# Patient Record
Sex: Male | Born: 1955 | Race: White | Hispanic: No | Marital: Married | Smoking: Never smoker
Health system: Southern US, Community
[De-identification: ages and names within clinical notes are randomized; demographics above are authoritative.]

## PROBLEM LIST (undated history)

## (undated) ENCOUNTER — Encounter

## (undated) ENCOUNTER — Encounter: Attending: Hematology & Oncology | Primary: Hematology & Oncology

## (undated) ENCOUNTER — Ambulatory Visit: Payer: MEDICARE

## (undated) ENCOUNTER — Telehealth

## (undated) ENCOUNTER — Ambulatory Visit

## (undated) ENCOUNTER — Encounter
Attending: Student in an Organized Health Care Education/Training Program | Primary: Student in an Organized Health Care Education/Training Program

## (undated) ENCOUNTER — Encounter: Attending: Oncology | Primary: Oncology

## (undated) ENCOUNTER — Ambulatory Visit
Payer: MEDICARE | Attending: Adult Reconstructive Orthopaedic Surgery | Primary: Adult Reconstructive Orthopaedic Surgery

## (undated) ENCOUNTER — Encounter: Payer: MEDICARE | Attending: Internal Medicine | Primary: Internal Medicine

## (undated) ENCOUNTER — Inpatient Hospital Stay: Payer: Medicare (Managed Care)

## (undated) ENCOUNTER — Encounter: Attending: "Endocrinology | Primary: "Endocrinology

## (undated) ENCOUNTER — Ambulatory Visit
Payer: MEDICARE | Attending: Student in an Organized Health Care Education/Training Program | Primary: Student in an Organized Health Care Education/Training Program

## (undated) ENCOUNTER — Telehealth: Attending: Family | Primary: Family

## (undated) ENCOUNTER — Other Ambulatory Visit

## (undated) ENCOUNTER — Encounter: Attending: Gastroenterology | Primary: Gastroenterology

## (undated) ENCOUNTER — Non-Acute Institutional Stay: Payer: MEDICARE

## (undated) ENCOUNTER — Telehealth
Attending: Student in an Organized Health Care Education/Training Program | Primary: Student in an Organized Health Care Education/Training Program

## (undated) ENCOUNTER — Telehealth: Attending: Internal Medicine | Primary: Internal Medicine

## (undated) ENCOUNTER — Encounter: Attending: Family Medicine | Primary: Family Medicine

## (undated) ENCOUNTER — Telehealth: Attending: Hematology & Oncology | Primary: Hematology & Oncology

## (undated) ENCOUNTER — Ambulatory Visit: Payer: MEDICARE | Attending: Otolaryngology | Primary: Otolaryngology

## (undated) ENCOUNTER — Encounter: Attending: Pharmacist | Primary: Pharmacist

## (undated) ENCOUNTER — Telehealth: Attending: Oncology | Primary: Oncology

## (undated) ENCOUNTER — Encounter: Attending: Hematology | Primary: Hematology

## (undated) ENCOUNTER — Ambulatory Visit: Payer: Medicare (Managed Care)

## (undated) ENCOUNTER — Encounter
Payer: MEDICARE | Attending: Student in an Organized Health Care Education/Training Program | Primary: Student in an Organized Health Care Education/Training Program

## (undated) ENCOUNTER — Encounter: Payer: MEDICARE | Attending: Family Medicine | Primary: Family Medicine

## (undated) ENCOUNTER — Telehealth: Attending: Family Medicine | Primary: Family Medicine

## (undated) ENCOUNTER — Encounter: Payer: MEDICARE | Attending: Oncology | Primary: Oncology

## (undated) ENCOUNTER — Ambulatory Visit: Payer: Medicare (Managed Care) | Attending: "Endocrinology | Primary: "Endocrinology

## (undated) ENCOUNTER — Ambulatory Visit: Attending: Otolaryngology | Primary: Otolaryngology

## (undated) DIAGNOSIS — C801 Malignant (primary) neoplasm, unspecified: Secondary | ICD-10-CM

## (undated) DIAGNOSIS — E119 Type 2 diabetes mellitus without complications: Secondary | ICD-10-CM

## (undated) HISTORY — PX: COLON SURGERY: SHX602

## (undated) HISTORY — PX: OTHER SURGICAL HISTORY: SHX169

---
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
## 2010-06-23 ENCOUNTER — Ambulatory Visit: Payer: Self-pay

## 2010-09-06 ENCOUNTER — Emergency Department: Payer: Self-pay | Admitting: Emergency Medicine

## 2010-11-05 ENCOUNTER — Ambulatory Visit: Payer: Self-pay

## 2011-02-17 ENCOUNTER — Ambulatory Visit: Payer: Self-pay | Admitting: Otolaryngology

## 2011-03-07 ENCOUNTER — Ambulatory Visit: Payer: Self-pay | Admitting: Unknown Physician Specialty

## 2011-03-07 DIAGNOSIS — G4730 Sleep apnea, unspecified: Secondary | ICD-10-CM

## 2011-03-13 ENCOUNTER — Ambulatory Visit: Payer: Self-pay | Admitting: Unknown Physician Specialty

## 2011-09-28 DIAGNOSIS — Z980 Intestinal bypass and anastomosis status: Secondary | ICD-10-CM

## 2011-12-31 DIAGNOSIS — H25049 Posterior subcapsular polar age-related cataract, unspecified eye: Secondary | ICD-10-CM | POA: Insufficient documentation

## 2012-01-29 DIAGNOSIS — G4733 Obstructive sleep apnea (adult) (pediatric): Secondary | ICD-10-CM | POA: Diagnosis present

## 2012-11-07 LAB — COMPREHENSIVE METABOLIC PANEL
Albumin: 5 g/dL (ref 3.4–5.0)
BUN: 13 mg/dL (ref 7–18)
Creatinine: 1.6 mg/dL — ABNORMAL HIGH (ref 0.60–1.30)
EGFR (African American): 55 — ABNORMAL LOW
EGFR (Non-African Amer.): 47 — ABNORMAL LOW
Glucose: 148 mg/dL — ABNORMAL HIGH (ref 65–99)
SGPT (ALT): 51 U/L (ref 12–78)
Total Protein: 9.1 g/dL — ABNORMAL HIGH (ref 6.4–8.2)

## 2012-11-07 LAB — CBC WITH DIFFERENTIAL/PLATELET
Basophil #: 0 10*3/uL (ref 0.0–0.1)
HCT: 54.6 % — ABNORMAL HIGH (ref 40.0–52.0)
Lymphocyte #: 0.9 10*3/uL — ABNORMAL LOW (ref 1.0–3.6)
Lymphocyte %: 4.9 %
MCH: 29.7 pg (ref 26.0–34.0)
MCHC: 34.4 g/dL (ref 32.0–36.0)
MCV: 87 fL (ref 80–100)
Platelet: 237 10*3/uL (ref 150–440)
RBC: 6.31 10*6/uL — ABNORMAL HIGH (ref 4.40–5.90)
RDW: 13 % (ref 11.5–14.5)
WBC: 18.4 10*3/uL — ABNORMAL HIGH (ref 3.8–10.6)

## 2012-11-07 LAB — URINALYSIS, COMPLETE
Bacteria: NONE SEEN
Bilirubin,UR: NEGATIVE
Blood: NEGATIVE
Glucose,UR: NEGATIVE mg/dL (ref 0–75)
Leukocyte Esterase: NEGATIVE
Nitrite: NEGATIVE
Ph: 6 (ref 4.5–8.0)
Protein: NEGATIVE
Specific Gravity: 1.008 (ref 1.003–1.030)

## 2012-11-07 LAB — TSH: Thyroid Stimulating Horm: 4.36 u[IU]/mL

## 2012-11-08 ENCOUNTER — Inpatient Hospital Stay: Payer: Self-pay | Admitting: Internal Medicine

## 2012-11-08 LAB — BASIC METABOLIC PANEL
Anion Gap: 8 (ref 7–16)
Calcium, Total: 8.5 mg/dL (ref 8.5–10.1)
Chloride: 103 mmol/L (ref 98–107)
Co2: 23 mmol/L (ref 21–32)
EGFR (African American): >60
Osmolality: 271 (ref 275–301)
Potassium: 3.6 mmol/L (ref 3.5–5.1)

## 2012-11-08 LAB — LIPASE, BLOOD: Lipase: 146 U/L (ref 73–393)

## 2012-11-09 LAB — BASIC METABOLIC PANEL
BUN: 15 mg/dL (ref 7–18)
Calcium, Total: 8.2 mg/dL — ABNORMAL LOW (ref 8.5–10.1)
EGFR (African American): >60
Glucose: 159 mg/dL — ABNORMAL HIGH (ref 65–99)
Potassium: 3.3 mmol/L — ABNORMAL LOW (ref 3.5–5.1)
Sodium: 135 mmol/L — ABNORMAL LOW (ref 136–145)

## 2012-11-09 LAB — CBC WITH DIFFERENTIAL/PLATELET
Basophil %: 0.7 %
Eosinophil #: 0.3 10*3/uL (ref 0.0–0.7)
Eosinophil %: 6 %
HGB: 14.1 g/dL (ref 13.0–18.0)
Lymphocyte #: 1 10*3/uL (ref 1.0–3.6)
Lymphocyte %: 17.4 %
MCV: 87 fL (ref 80–100)
Monocyte #: 0.4 x10 3/mm (ref 0.2–1.0)
Monocyte %: 7.4 %
Platelet: 185 10*3/uL (ref 150–440)
RBC: 4.82 10*6/uL (ref 4.40–5.90)
RDW: 12.8 % (ref 11.5–14.5)

## 2012-11-09 LAB — HEPATIC FUNCTION PANEL A (ARMC)
Albumin: 3.3 g/dL — ABNORMAL LOW (ref 3.4–5.0)
Alkaline Phosphatase: 142 U/L — ABNORMAL HIGH (ref 50–136)
Bilirubin, Direct: 0.2 mg/dL (ref 0.00–0.20)
Bilirubin,Total: 1 mg/dL (ref 0.2–1.0)
SGPT (ALT): 39 U/L (ref 12–78)
Total Protein: 6.3 g/dL — ABNORMAL LOW (ref 6.4–8.2)

## 2012-11-09 LAB — MAGNESIUM: Magnesium: 1.5 mg/dL — ABNORMAL LOW

## 2012-11-11 LAB — STOOL CULTURE

## 2013-05-28 ENCOUNTER — Emergency Department: Payer: Self-pay | Admitting: Emergency Medicine

## 2013-05-28 LAB — COMPREHENSIVE METABOLIC PANEL
Albumin: 4.1 g/dL (ref 3.4–5.0)
Anion Gap: 5 — ABNORMAL LOW (ref 7–16)
BUN: 13 mg/dL (ref 7–18)
Calcium, Total: 9.1 mg/dL (ref 8.5–10.1)
Creatinine: 1.33 mg/dL — ABNORMAL HIGH (ref 0.60–1.30)
EGFR (African American): >60
EGFR (Non-African Amer.): 59 — ABNORMAL LOW
Glucose: 170 mg/dL — ABNORMAL HIGH (ref 65–99)
SGOT(AST): 48 U/L — ABNORMAL HIGH (ref 15–37)
SGPT (ALT): 56 U/L (ref 12–78)
Total Protein: 8.2 g/dL (ref 6.4–8.2)

## 2013-05-28 LAB — URINALYSIS, COMPLETE
Bilirubin,UR: NEGATIVE
Glucose,UR: NEGATIVE mg/dL (ref 0–75)
Nitrite: NEGATIVE
Ph: 6 (ref 4.5–8.0)
RBC,UR: 1 /HPF (ref 0–5)
Squamous Epithelial: NONE SEEN
WBC UR: <1 /HPF (ref 0–5)

## 2013-05-28 LAB — CBC
HGB: 16.7 g/dL (ref 13.0–18.0)
MCHC: 35.3 g/dL (ref 32.0–36.0)
Platelet: 258 10*3/uL (ref 150–440)
RDW: 13.3 % (ref 11.5–14.5)
WBC: 11.8 10*3/uL — ABNORMAL HIGH (ref 3.8–10.6)

## 2013-05-28 LAB — CK TOTAL AND CKMB (NOT AT ARMC)
CK, Total: 69 U/L (ref 35–232)
CK-MB: 1.7 ng/mL (ref 0.5–3.6)

## 2013-08-18 DIAGNOSIS — K5190 Ulcerative colitis, unspecified, without complications: Secondary | ICD-10-CM | POA: Insufficient documentation

## 2013-10-06 DIAGNOSIS — J383 Other diseases of vocal cords: Secondary | ICD-10-CM | POA: Insufficient documentation

## 2013-10-13 DIAGNOSIS — G4761 Periodic limb movement disorder: Secondary | ICD-10-CM | POA: Insufficient documentation

## 2013-11-12 ENCOUNTER — Emergency Department: Payer: Self-pay | Admitting: Emergency Medicine

## 2013-11-12 LAB — CBC WITH DIFFERENTIAL/PLATELET
BASOS ABS: 0.1 10*3/uL (ref 0.0–0.1)
Basophil %: 0.4 %
Eosinophil #: 0 10*3/uL (ref 0.0–0.7)
Eosinophil %: 0.2 %
HCT: 53.6 % — AB (ref 40.0–52.0)
HGB: 18.4 g/dL — ABNORMAL HIGH (ref 13.0–18.0)
LYMPHS ABS: 0.6 10*3/uL — AB (ref 1.0–3.6)
Lymphocyte %: 4.5 %
MCH: 30 pg (ref 26.0–34.0)
MCHC: 34.3 g/dL (ref 32.0–36.0)
MCV: 87 fL (ref 80–100)
MONO ABS: 0.5 x10 3/mm (ref 0.2–1.0)
MONOS PCT: 3.9 %
NEUTROS ABS: 12.8 10*3/uL — AB (ref 1.4–6.5)
Neutrophil %: 91 %
Platelet: 240 10*3/uL (ref 150–440)
RBC: 6.14 10*6/uL — ABNORMAL HIGH (ref 4.40–5.90)
RDW: 13.2 % (ref 11.5–14.5)
WBC: 14.1 10*3/uL — ABNORMAL HIGH (ref 3.8–10.6)

## 2013-11-12 LAB — COMPREHENSIVE METABOLIC PANEL
ALBUMIN: 4.4 g/dL (ref 3.4–5.0)
ALK PHOS: 319 U/L — AB
ANION GAP: 9 (ref 7–16)
AST: 57 U/L — AB (ref 15–37)
BUN: 13 mg/dL (ref 7–18)
Bilirubin,Total: 1.6 mg/dL — ABNORMAL HIGH (ref 0.2–1.0)
CALCIUM: 10.5 mg/dL — AB (ref 8.5–10.1)
CO2: 25 mmol/L (ref 21–32)
CREATININE: 1.6 mg/dL — AB (ref 0.60–1.30)
Chloride: 98 mmol/L (ref 98–107)
EGFR (Non-African Amer.): 47 — ABNORMAL LOW
GFR CALC AF AMER: 54 — AB
Glucose: 265 mg/dL — ABNORMAL HIGH (ref 65–99)
OSMOLALITY: 274 (ref 275–301)
Potassium: 3.9 mmol/L (ref 3.5–5.1)
SGPT (ALT): 81 U/L — ABNORMAL HIGH (ref 12–78)
Sodium: 132 mmol/L — ABNORMAL LOW (ref 136–145)
TOTAL PROTEIN: 9.5 g/dL — AB (ref 6.4–8.2)

## 2013-11-12 LAB — LIPASE, BLOOD: LIPASE: 361 U/L (ref 73–393)

## 2013-11-13 LAB — URINALYSIS, COMPLETE
BACTERIA: NONE SEEN
Bilirubin,UR: NEGATIVE
Blood: NEGATIVE
Glucose,UR: NEGATIVE mg/dL (ref 0–75)
KETONE: NEGATIVE
Leukocyte Esterase: NEGATIVE
Nitrite: NEGATIVE
Ph: 5 (ref 4.5–8.0)
RBC,UR: 2 /HPF (ref 0–5)
SPECIFIC GRAVITY: 1.021 (ref 1.003–1.030)
Squamous Epithelial: NONE SEEN
WBC UR: 1 /HPF (ref 0–5)

## 2014-09-01 DIAGNOSIS — K91850 Pouchitis: Secondary | ICD-10-CM | POA: Insufficient documentation

## 2014-10-22 ENCOUNTER — Inpatient Hospital Stay: Payer: Self-pay | Admitting: Internal Medicine

## 2014-10-22 LAB — CBC WITH DIFFERENTIAL/PLATELET
Basophil #: 0.1 10*3/uL (ref 0.0–0.1)
Basophil %: 0.3 %
Eosinophil #: 0.1 10*3/uL (ref 0.0–0.7)
Eosinophil %: 0.3 %
HCT: 51.6 % (ref 40.0–52.0)
HGB: 17.5 g/dL (ref 13.0–18.0)
LYMPHS ABS: 0.7 10*3/uL — AB (ref 1.0–3.6)
Lymphocyte %: 3.5 %
MCH: 29 pg (ref 26.0–34.0)
MCHC: 33.9 g/dL (ref 32.0–36.0)
MCV: 86 fL (ref 80–100)
MONO ABS: 0.4 x10 3/mm (ref 0.2–1.0)
MONOS PCT: 1.8 %
NEUTROS PCT: 94.1 %
Neutrophil #: 18.7 10*3/uL — ABNORMAL HIGH (ref 1.4–6.5)
PLATELETS: 300 10*3/uL (ref 150–440)
RBC: 6.04 10*6/uL — AB (ref 4.40–5.90)
RDW: 12.8 % (ref 11.5–14.5)
WBC: 19.9 10*3/uL — AB (ref 3.8–10.6)

## 2014-10-22 LAB — COMPREHENSIVE METABOLIC PANEL
ALBUMIN: 4.3 g/dL (ref 3.4–5.0)
ALK PHOS: 251 U/L — AB (ref 46–116)
ALT: 56 U/L (ref 14–63)
AST: 45 U/L — AB (ref 15–37)
Anion Gap: 10 (ref 7–16)
BILIRUBIN TOTAL: 2.7 mg/dL — AB (ref 0.2–1.0)
BUN: 17 mg/dL (ref 7–18)
CHLORIDE: 92 mmol/L — AB (ref 98–107)
Calcium, Total: 9.9 mg/dL (ref 8.5–10.1)
Co2: 23 mmol/L (ref 21–32)
Creatinine: 1.63 mg/dL — ABNORMAL HIGH (ref 0.60–1.30)
EGFR (Non-African Amer.): 46 — ABNORMAL LOW
GFR CALC AF AMER: 56 — AB
GLUCOSE: 295 mg/dL — AB (ref 65–99)
Osmolality: 264 (ref 275–301)
Potassium: 4.9 mmol/L (ref 3.5–5.1)
Sodium: 125 mmol/L — ABNORMAL LOW (ref 136–145)
Total Protein: 9.2 g/dL — ABNORMAL HIGH (ref 6.4–8.2)

## 2014-10-22 LAB — URINALYSIS, COMPLETE
BILIRUBIN, UR: NEGATIVE
Bacteria: NONE SEEN
Blood: NEGATIVE
Glucose,UR: NEGATIVE mg/dL (ref 0–75)
Hyaline Cast: 41
KETONE: NEGATIVE
Leukocyte Esterase: NEGATIVE
Nitrite: NEGATIVE
PH: 6 (ref 4.5–8.0)
Protein: 30
RBC,UR: 1 /HPF (ref 0–5)
SPECIFIC GRAVITY: 1.015 (ref 1.003–1.030)
WBC UR: 1 /HPF (ref 0–5)

## 2014-10-22 LAB — OCCULT BLOOD X 1 CARD TO LAB, STOOL: Occult Blood, Feces: POSITIVE

## 2014-10-22 LAB — CLOSTRIDIUM DIFFICILE(ARMC)

## 2014-10-23 LAB — CBC WITH DIFFERENTIAL/PLATELET
Basophil #: 0 10*3/uL (ref 0.0–0.1)
Basophil %: 0.4 %
EOS ABS: 0.3 10*3/uL (ref 0.0–0.7)
EOS PCT: 4.4 %
HCT: 40.5 % (ref 40.0–52.0)
HGB: 14 g/dL (ref 13.0–18.0)
LYMPHS PCT: 18.2 %
Lymphocyte #: 1.3 10*3/uL (ref 1.0–3.6)
MCH: 29.7 pg (ref 26.0–34.0)
MCHC: 34.5 g/dL (ref 32.0–36.0)
MCV: 86 fL (ref 80–100)
MONO ABS: 0.3 x10 3/mm (ref 0.2–1.0)
Monocyte %: 4.9 %
NEUTROS PCT: 72.1 %
Neutrophil #: 5 10*3/uL (ref 1.4–6.5)
Platelet: 186 10*3/uL (ref 150–440)
RBC: 4.7 10*6/uL (ref 4.40–5.90)
RDW: 12.9 % (ref 11.5–14.5)
WBC: 6.9 10*3/uL (ref 3.8–10.6)

## 2014-10-23 LAB — HEPATIC FUNCTION PANEL A (ARMC)
ALK PHOS: 174 U/L — AB (ref 46–116)
Albumin: 3 g/dL — ABNORMAL LOW (ref 3.4–5.0)
BILIRUBIN DIRECT: 0.3 mg/dL — AB (ref 0.0–0.2)
Bilirubin,Total: 1.5 mg/dL — ABNORMAL HIGH (ref 0.2–1.0)
SGOT(AST): 29 U/L (ref 15–37)
SGPT (ALT): 36 U/L (ref 14–63)
Total Protein: 6.4 g/dL (ref 6.4–8.2)

## 2014-10-23 LAB — BASIC METABOLIC PANEL
Anion Gap: 5 — ABNORMAL LOW (ref 7–16)
BUN: 16 mg/dL (ref 7–18)
CALCIUM: 8.3 mg/dL — AB (ref 8.5–10.1)
CREATININE: 1.41 mg/dL — AB (ref 0.60–1.30)
Chloride: 107 mmol/L (ref 98–107)
Co2: 26 mmol/L (ref 21–32)
EGFR (African American): >60
GFR CALC NON AF AMER: 55 — AB
Glucose: 175 mg/dL — ABNORMAL HIGH (ref 65–99)
Osmolality: 281 (ref 275–301)
POTASSIUM: 4 mmol/L (ref 3.5–5.1)
Sodium: 138 mmol/L (ref 136–145)

## 2014-10-23 LAB — HEMOGLOBIN A1C: Hemoglobin A1C: 8.7 % — ABNORMAL HIGH (ref 4.2–6.3)

## 2014-10-24 LAB — WBCS, STOOL

## 2014-10-24 LAB — STOOL CULTURE

## 2015-01-14 NOTE — H&P (Signed)
PATIENT NAME:  [name], [name] MR#:  [id number] DATE OF BIRTH:  1955-10-13  DATE OF ADMISSION:  11/08/2012  PRIMARY CARE PHYSICIAN: [facility].  REFERRING PHYSICIAN: Conni Slipper, MD  CHIEF COMPLAINT: Abdominal pain, nausea, vomiting, syncope.   HISTORY OF PRESENT ILLNESS: This is a 59 year old male with significant past medical history of colon cancer, status post colectomy due to colon cancer, history of ulcerative colitis in 1990, history of chronically elevated LFTs, depression and short gut syndrome, who presents with complaints of syncope. The patient reports syncope happened when he was going to the bathroom. He felt lightheaded and dizzy, where he fell on the floor and had loss of consciousness, where he was found by his wife, who heard him falling on the floor. The patient remained unconscious for 3 minutes. No seizure-like activity, no tongue biting, no frothy discharge from the mouth. The patient reports he has been having significant diarrhea for the last 2 days with vomiting and nausea as well. Reports usually has baseline diarrhea due to his colectomy with short gut syndrome, but it is usually more frequent episodes but not watery, but reports it has been watery for the last 2 days, multiple episodes, as well had significant nausea and vomiting today, 5 times so far. As well, reports decreased p.o. intake. The patient reports he thinks he ate some bad fruit 2 days before his symptoms started. Denies anyone else in the house has similar symptoms and denies eating outside. The patient was found in acute renal failure with creatinine of 1.6. As well, he was found to have low sodium of 132, and he has significant leukocytosis of 18,000 and significant polycythemia of 18 as well. The patient reports he is having history of chronic elevated LFTs, but unclear what is his baseline. Reports questionable family history of primary sclerosing cholangitis in his sister. Reports he has been  followed by [facility], last time he saw them was before 1 year. The patient had CT head, which did not show any evidence of acute finding, but did show evidence of sinusitis. The patient has been reporting flulike symptoms, runny nose and discharge over the last 3 days as well. CT abdomen and pelvis did show mild dilatation of mid to distal small bowel loop with some air-fluid levels at multiple levels, which is possible dynamic ileus or potentially enteritis. As well, this might be also seen status post colectomy state. Hospitalist service was requested to admit the patient for further management and workup of his symptoms.   ALLERGIES: SULFA DRUGS.   HOME MEDICATIONS:  1. Ibuprofen as needed.  2. Fluoxetine 20 mg daily.  3. Omeprazole 20 mg daily.  4. Zantac 0.5 mL every 12 hours. 5. Percocet 1 tablet as needed for pain.  6. Ultram 50 mg every 6 hours as needed for pain.   PAST MEDICAL HISTORY: 1. Allergies. 2. Sleep apnea.  3. Cataract.  4. Colon cancer in the 1990s. 5. Ulcerative colitis in the 1990s.  6. Anxiety.  7. Detached left retina.  8. History of liver disease.  9. Depression.   PAST SURGICAL HISTORY:  1. Left retinal repair at 59 years old.  2. Right knee arthroscopy.  3. Removal of 90% of colon in the late 1990s. 4. J-pouch reversal.  5. Neck surgery with disk replacement in 2008.  6. Voice box surgery.  7. Arthroscopic rotator cuff repair on the left shoulder.  8. Colectomy due to colon cancer.   SOCIAL HISTORY: Married. No history of alcohol  or tobacco use or illicit drug use.   FAMILY HISTORY: Significant for rheumatoid arthritis in his siblings, colon cancer in father and sister and reports family history of liver disease in his sister, for which she received transplant, which failed. Reports questionable history of primary sclerosing cholangitis in his sister.   REVIEW OF SYSTEMS:  CONSTITUTIONAL: Denies any fever or chills. Complains of fatigue, weakness. Denies  weight gain, weight loss.  EYES: Denies blurry vision, double vision, inflammation.  ENT: Denies tinnitus, ear pain, hearing loss, epistaxis. Has discharge and some postnasal drip.  RESPIRATORY: Denies cough, wheezing, hemoptysis, COPD, pneumonia.  CARDIOVASCULAR: Denies chest pain, edema, orthopnea, palpitations. Had syncope.  GASTROINTESTINAL: Complains of nausea, vomiting, diarrhea, with mild abdominal pain. Denies hematemesis, rectal bleed, coffee-ground emesis or melena.  GENITOURINARY: Denies dysuria, hematuria, renal colic.  ENDOCRINE: Denies polyuria, polydipsia, heat or cold intolerance.  HEMATOLOGY: Denies anemia, easy bruising, bleeding diathesis.  INTEGUMENTARY: Denies acne, rash or skin lesions.  MUSCULOSKELETAL: Denies any swelling, gout, limited activity or cramps.  NEUROLOGICAL: Denies numbness, dysarthria, epilepsy, tremors, vertigo, CVA or seizures in the past.  PSYCHIATRIC: Denies substance abuse. Denies alcohol abuse. Has history of depression. Denies insomnia.   PHYSICAL EXAMINATION:  VITAL SIGNS: Temperature 98.4, pulse 91, respiratory rate 18, blood pressure 110/70, saturating 99% on room air.  GENERAL: Well-nourished male, looks comfortable, in no apparent distress.  HEENT: Head atraumatic, normocephalic. Pupils equally reactive to light. Pink conjunctivae. Anicteric sclerae. Dry oral mucosa.  NECK: Supple. No thyromegaly. No JVD.  CHEST: Good air entry bilaterally. No wheezing, rales or rhonchi.  CARDIOVASCULAR: S1, S2 heard. No rubs, murmurs or gallops.  ABDOMEN: Very minimal tenderness in the right lower quadrant, but no rebound, no guarding. Bowel sounds hyperperistaltic. No organomegaly could be felt.  EXTREMITIES: No edema. No clubbing. No cyanosis.  PSYCHIATRIC: Appropriate, awake, alert x3. Intact judgment and insight.  NEUROLOGIC: Cranial nerves grossly intact. Motor 5 out of 5.  SKIN: Has delayed skin turgor, appears dehydrated. Warm and dry.   PERTINENT  LABORATORY: Glucose 148, BUN 13, creatinine 1.06, sodium 132, potassium 4.2, chloride 97, CO2 27, total protein 9.1, total bilirubin 2.3, alkaline phosphatase 204, AST 46, ALT 51. Troponin less than 0.02. TSH 4.36. Urinalysis negative. White blood cells 18.4, hemoglobin 18.8, hematocrit 54.6, platelets 237. C. difficile negative.   IMAGING: 1. CT abdomen and pelvis without contrast showing distended fluid- and air-filled loops in the small bowel appreciated throughout the abdomen and pelvis, finding can be seen due to ileus, possibly enteritis. As well, these findings can be found in small bowel in postcolectomy state.  2. CT head without contrast: No evidence of acute intracranial abnormality and sinus disease on the left.  3. Chest x-ray showing atelectasis versus infiltrate in the right middle lobe   ASSESSMENT AND PLAN: This is a 59 year old male who presents with syncope, most likely due to volume depletion due to significant diarrhea and vomiting from gastroenteritis.   1. Syncope. The patient appeared to be dehydrated with volume depletion with acute renal failure secondary to his vomiting and diarrhea. Will check orthostatics, but the patient is tachycardic with soft blood pressure, so will continue with aggressive hydration. Will check orthostatics and will have him on telemonitor.  2. Vomiting and diarrhea. This is most likely due to gastroenteritis. CT abdomen and pelvis showing multiple possible findings, but it is unlikely to be ileus as he is having significant diarrhea. As well, he has history of eating some bad fruit. He is C. difficile  negative as well. Will continue him on Cipro and Flagyl and will follow on the stool workup. Will continue him on Cipro and Flagyl until the rest of workup is back. As well, his symptoms have worsened by baseline diarrhea due to short gut syndrome from his colectomy.  3. Hyponatremia due to volume depletion. Continue with fluids.  4. Leukocytosis and  elevated hemoglobin. This is from volume depletion and dehydration. Will recheck after appropriate hydration.  5. Elevated liver function tests. The patient reports he is known to have history of elevated LFTs. Unclear what is his baseline. He was seen 1 time last year at [place], and he was told that they will continue to monitor. For now, will check his right upper quadrant ultrasound.  6. Depression. Continue home medication.  7. Deep vein thrombosis prophylaxis. Subcutaneous heparin.  8. Gastrointestinal prophylaxis. On Protonix.   CODE STATUS: Full code.   TOTAL TIME SPENT ON ADMISSION AND PATIENT CARE: 60 minutes   ____________________________ Albertine Patricia, MD dse:OSi D: 11/08/2012 02:36:38 ET T: 11/08/2012 14:02:45 ET JOB#: [id number]  cc: Albertine Patricia, MD, <Dictator> DAWOOD Graciela Husbands MD ELECTRONICALLY SIGNED 11/10/2012 3:54

## 2015-01-14 NOTE — Discharge Summary (Signed)
PATIENT NAME:  [name], [name] MR#:  [id number] DATE OF BIRTH:  17-Aug-1956  DATE OF ADMISSION:  11/08/2012 DATE OF DISCHARGE:  11/09/2012  PRIMARY CARE PHYSICIAN: Dr. Sara Chu   DISCHARGE DIAGNOSES: 1. Acute gastroenteritis.  2. Syncope.  3. Acute renal failure.  4. Hyponatremia.  5. Hypokalemia.  6. Hypomagnesemia.   CONDITION: Stable.   CODE STATUS:  FULL CODE.     HOME MEDICATIONS:  1. Fluoxetine 20 mg p.o. daily.  2. Omeprazole 20 mg p.o. daily.  3. Zantac p.o. every 12 hours.  4. Fluticasone nasal 50 mcg INH nasal spray 1 spray once a day.  5. Cipro 500 mg p.o. q. 12 hours for 5 days.  6.   Klor-con 10 1 tab p.o. once a day for 5 days.  7. Ambien 5 mg p.o. at bedtime for 10 days.  8. Imodium 1 tablet every 4 hours p.r.n. for diarrhea for 7 days.   DIET: Regular diet.   ACTIVITY: As tolerated.   FOLLOW-UP CARE:  1. Follow up with PCP within 1 to 2 weeks.  2. Follow up with [facility].   REASON FOR ADMISSION: Abdominal pain, nausea, vomiting and syncope.   HOSPITAL COURSE: The patient is a 59 year old Caucasian male with a history of colon cancer, status post colectomy due to colon cancer, history of ulcerative colitis, chronically elevated liver function tests, short gut syndrome, presented to the ED with syncope. The patient felt lightheaded and dizzy in the bathroom where he fell on the floor and had a loss of consciousness.  The patient reports he had significant diarrhea for 2 days before this admission with nausea, vomiting.  The patient was found  in acute renal failure with a creatinine of 1.6. In addition, the patient has low sodium 132, white count 18,000.  For a detailed history and physical examination, please refer to the admission note dictated by Dr. Waldron Labs  The patient's CAT scan of the abdomen and pelvis showed mild dilatation of mid to distal small bowel loop with some air-fluid levels at multiple levels which is a possible  dynamic ileus or  potential enteritis. After admission, the patient has been treated with IV fluid support and Cipro for acute gastroenteritis. Diet was advanced to a regular diet. The patient tolerated the diet well.   Other medical problems: 1. Acute renal failure and hyponatremia:  The patient was treated with a normal saline IV. Acute renal failure has improved.  2. Syncope: Possibly due to dehydration and volume depletion.  3. Leukocytosis: The patient's WBC was 18.4 on admission but decreased to 5.8 today. In addition, the patient's stool C. difficile test is negative.  4. Hypokalemia: The patient's potassium is 3.3 today.  He was given potassium supplement.  5. Hypomagnesemia: Magnesium level is 1.5 today and was treated with magnesium IVPB.  6. Disposition: The patient still has some loose stool today, but clinically he is stable.  He will be discharged to home today and follow up with [facility].    I discussed the patient's discharge plan with the patient, and the patient's family member, and the case Freight forwarder.   TIME SPENT: About 38 minutes.  ____________________________ Demetrios Loll, MD qc:cb D: 11/09/2012 16:06:56 ET T: 11/10/2012 11:34:58 ET JOB#: [id number]  cc: Demetrios Loll, MD, <Dictator> Demetrios Loll MD ELECTRONICALLY SIGNED 11/10/2012 17:08

## 2015-01-23 NOTE — Discharge Summary (Signed)
PATIENT NAME:  [name], [name] MR#:  [id number] DATE OF BIRTH:  10-Dec-1955  DATE OF ADMISSION:  10/22/2014 DATE OF DISCHARGE:  10/23/2014  DISCHARGE DIAGNOSES:  1.  Gastroenteritis.  2.  Dehydration.  3.  Malabsorption syndrome.  4.  History of colectomy.   DISCHARGE MEDICATIONS:  1.  Fluoxetine 20 mg daily.  2.  Omeprazole 40 mg 2 times a day. .  4.  Bactrim DS 800/160 at 1 tablet oral 2 times a day.   DISCHARGE INSTRUCTIONS: Regular food, activity as tolerated. Follow up with primary care physician in 1 to 2 weeks.   IMAGING STUDIES: CT scan of the abdomen and pelvis with contrast showed status post total colectomy, nothing acute found.   ADMITTING HISTORY AND PHYSICAL AND HOSPITAL COURSE: Please see detailed H and P dictated previously.  In brief, a 59 year old male patient, with history of prior colectomy with a pouch, presented to the hospital complaining of abdominal pain, nausea, vomiting, diarrhea. The patient was found to be dehydrated, admitted to the hospitalist service.  The patient was initially started on antibiotics, which were later stopped as he was afebrile.  The patient did have leukocytosis likely from his dehydration hemoconcentration which was resolved with IV fluids. The patient, by the day of discharge, is much improved, afebrile, normal white count, and is being discharged home.   The patient at baseline seems to have diarrhea secondary to his colectomy which got worse with a gastroenteritis.   Prior to discharge, the patient's abdomen is nontender. Bowel sounds normal. S1, S2 heard. Lungs clear, alert and oriented x 3. Has ambulated on his own.   TIME SPENT ON DAY OF DISCHARGE IN DISCHARGE ACTIVITY: 35 minutes.   again cc copy to [facility] gastroenterology department  ____________________________ Leia Alf. Quinnton Bury, MD srs:DT D: 10/25/2014 16:26:00 ET T: 10/25/2014 17:23:21 ET JOB#: [id number]  cc: Alveta Heimlich R. Darvin Neighbours, MD, <Dictator> [facility]  Gastroenterology Department  Neita Carp MD ELECTRONICALLY SIGNED 11/01/2014 3:53

## 2015-01-23 NOTE — H&P (Signed)
PATIENT NAME:  [name], CROOKSTON MR#:  [id number] DATE OF BIRTH:  [birth year]  DATE OF ADMISSION:  10/22/2014  PRIMARY CARE PHYSICIAN: [facility]  GASTROENTEROLOGIST: [facility]  CHIEF COMPLAINT: Abdominal pain, nausea, vomiting, diarrhea.   HISTORY OF PRESENT ILLNESS: This is a 59 year old man with malabsorption syndrome after a colectomy. He has been battling hydration status for a long time. He had diarrhea for 1 week. No blood in the bowel movements. He has been going every 1-1/2 hours, then he developed vomiting 24 hours ago, then he developed confusion, lightheadedness and cramps. He just started Bactrim yesterday for some skin lesions on the back. He has had severe abdominal cramping, scale 10/10 in intensity. In the ER, he was found to have a low sodium, borderline creatinine. CT scan was negative. Stool for C. difficile was negative. Hospitalist services were contacted for further evaluation.   PAST MEDICAL HISTORY: Malabsorption syndrome, PTSD and anxiety, gastroesophageal reflux disease, elevated liver function tests, likely ulcerative colitis leading to colectomy.   PAST SURGICAL HISTORY: Colon surgery, knee surgery, shoulder surgery, hand surgery.   ALLERGIES: SULFA.   MEDICATIONS: Include Bactrim DS 800/160 one tablet twice a day, desipramine 25 mg 2 tablets at bedtime, fluoxetine 20 mg daily, omeprazole 40 mg twice a day.   SOCIAL HISTORY: No smoking. No alcohol. No drug use. Is on Social Security disability. 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 Mother died at 43, had autoimmune issues. Sister died of a young age, also had a colectomy and then had sclerosing cholangitis and had a transplant of the liver, but died soon after.   REVIEW OF SYSTEMS: CONSTITUTIONAL: Positive for fever. Positive for chills. Positive for weight loss. Positive for weakness.  HEENT: Eyes: He does wear glasses. Ears,  nose, mouth, and throat: No sore throat. Positive for dysphagia to liquids.  CARDIOVASCULAR: No chest pain. No palpitations.  RESPIRATORY: Positive for shortness of breath. No cough. No sputum. No hemoptysis.  GASTROINTESTINAL: Positive for nausea and vomiting. No hematemesis. Positive or diarrhea. No bright red blood per rectum. No melena. Positive for abdominal cramping and pain.  GENITOURINARY: No burning on urination or hematuria.  MUSCULOSKELETAL: No joint pain or muscle pain, but does have cramps in the legs.  NEUROLOGIC: Positive for confusion.  PSYCHIATRIC: Positive for anxiety and posttraumatic stress disorder.  ENDOCRINE: No thyroid problems. Hematologic and lymphatic: No anemia. No easy bruising or bleeding.   PHYSICAL EXAMINATION:  VITAL SIGNS: Temperature 97.6, pulse 92, respirations 18, blood pressure 107/74, pulse oximetry 98% on room air.  GENERAL: No respiratory distress.  HEENT: Eyes: Conjunctivae and lids normal. Pupils equal, round, and reactive to light. Extraocular muscles intact. No nystagmus. Ears, nose, mouth, and throat: Tympanic membranes: No erythema. Nasal mucosa: No erythema. Throat: No erythema. No exudate seen. Lips and gums: No lesions.  NECK: No JVD. No bruits. No lymphadenopathy. No thyromegaly. No thyroid nodules palpated.  RESPIRATORY: Lungs: Clear to auscultation. No use of accessory muscles to breathe. No rhonchi, rales, or wheeze heard.  CARDIOVASCULAR: S1, S2 normal. No gallops, rubs, or murmurs heard. Carotid upstroke 2+ bilaterally. No bruits. Good pulses 2+ bilaterally. No edema of the lower extremity.  ABDOMEN: Soft. Positive tenderness throughout the lower abdomen. No organomegaly/splenomegaly. Normoactive bowel sounds. No masses felt.  LYMPHATIC: No lymph nodes in the neck.  MUSCULOSKELETAL: No clubbing, edema, or cyanosis.  SKIN: Does have the small boils in the process of  healing on the left back, 2 of them and 1 on the right buttock.  NEUROLOGIC:  Cranial nerves II through XII grossly intact. Deep tendon reflexes 2+ bilateral lower extremity.  PSYCHIATRIC: The patient is oriented to person, place, and time.   LABORATORY AND RADIOLOGICAL DATA: Glucose 295, BUN 17, creatinine 1.63, sodium 125, potassium 4.9, chloride 92, CO2 of 23, calcium 9.9, total bilirubin 2.7, alkaline phosphatase 251, ALT 56, AST of 45, total protein 9.2. Urinalysis negative. White blood cell count 19.9, H and H 17.5 and 51.6, platelet count of 30,000. Occult blood positive. Stool for Clostridium difficile negative.   CT scan of the abdomen and pelvis negative.   EKG: Sinus tachycardia, 108 beats per minute, left atrial enlargement, septal infarct.   ASSESSMENT AND PLAN:  1.  Hyponatremia, likely secondary to dehydration: We will give IV fluid hydration; another 2 liters wide open and 75 mL/h.  2.  Nausea, vomiting, diarrhea: The patient does have a history of malabsorption syndrome, could be that his pouch is a little bit inflamed. I will give empiric Cipro and Flagyl at this point and give IV fluid hydration and symptomatic management with p.r.n. nausea and pain medications. Leukocytosis likely secondary to nausea and vomiting.  3.  Posttraumatic stress disorder and anxiety: Continue psychiatric medications.  4.  Gastroesophageal reflux disease without esophagitis: The patient on omeprazole as outpatient. We will continue Protonix here.  5.  Elevated liver function tests: This is known to the patient and his gastroenterologist. We will recheck in a.m., likely secondary to not eating much and vomiting with the elevated bilirubin.  6.  Boils on the back: Cipro should have some coverage, but may be able to go back on the Bactrim as outpatient.  7.  Impaired fasting glucose with an elevated sugar: We will check a hemoglobin A1c and put on sliding scale at this point. Need to check if the patient is a diabetic or not.   TIME SPENT ON ADMISSION: 55 minutes.     ____________________________ Tana Conch. Leslye Peer, MD [id number] D: 10/22/2014 17:26:10 ET T: 10/22/2014 18:13:30 ET JOB#: [id number]  cc: Tana Conch. Leslye Peer, MD, <Dictator>   Marisue Brooklyn MD ELECTRONICALLY SIGNED 10/25/2014 15:42

## 2015-08-24 DIAGNOSIS — F410 Panic disorder [episodic paroxysmal anxiety] without agoraphobia: Secondary | ICD-10-CM | POA: Diagnosis present

## 2015-09-12 DIAGNOSIS — B351 Tinea unguium: Secondary | ICD-10-CM | POA: Insufficient documentation

## 2016-08-08 DIAGNOSIS — IMO0002 Reserved for concepts with insufficient information to code with codable children: Secondary | ICD-10-CM | POA: Diagnosis present

## 2016-09-04 DIAGNOSIS — I10 Essential (primary) hypertension: Secondary | ICD-10-CM | POA: Diagnosis present

## 2016-09-11 DIAGNOSIS — N529 Male erectile dysfunction, unspecified: Secondary | ICD-10-CM | POA: Insufficient documentation

## 2017-04-01 ENCOUNTER — Ambulatory Visit: Admission: RE | Admit: 2017-04-01 | Discharge: 2017-04-01 | Payer: MEDICARE

## 2017-04-01 DIAGNOSIS — E1165 Type 2 diabetes mellitus with hyperglycemia: Secondary | ICD-10-CM

## 2017-04-01 DIAGNOSIS — F329 Major depressive disorder, single episode, unspecified: Principal | ICD-10-CM

## 2017-04-01 DIAGNOSIS — S069X0S Unspecified intracranial injury without loss of consciousness, sequela: Secondary | ICD-10-CM

## 2017-04-02 DIAGNOSIS — S069XAA Unspecified intracranial injury with loss of consciousness status unknown, initial encounter: Secondary | ICD-10-CM | POA: Diagnosis present

## 2017-04-24 ENCOUNTER — Ambulatory Visit
Admission: RE | Admit: 2017-04-24 | Discharge: 2017-04-24 | Disposition: A | Discharge status: Home / Self Care | Payer: MEDICARE

## 2017-04-24 DIAGNOSIS — R569 Unspecified convulsions: Secondary | ICD-10-CM

## 2017-05-22 ENCOUNTER — Ambulatory Visit
Admission: RE | Admit: 2017-05-22 | Discharge: 2017-05-22 | Disposition: A | Discharge status: Home / Self Care | Payer: MEDICARE | Admitting: Geriatric Medicine

## 2017-05-22 DIAGNOSIS — E1165 Type 2 diabetes mellitus with hyperglycemia: Secondary | ICD-10-CM

## 2017-05-22 DIAGNOSIS — E1169 Type 2 diabetes mellitus with other specified complication: Secondary | ICD-10-CM

## 2017-05-22 DIAGNOSIS — F410 Panic disorder [episodic paroxysmal anxiety] without agoraphobia: Principal | ICD-10-CM

## 2017-05-22 DIAGNOSIS — S069X0S Unspecified intracranial injury without loss of consciousness, sequela: Secondary | ICD-10-CM

## 2017-05-22 DIAGNOSIS — I10 Essential (primary) hypertension: Secondary | ICD-10-CM

## 2017-05-22 MED ORDER — LIRAGLUTIDE 0.6 MG/0.1 ML (18 MG/3 ML) SUBCUTANEOUS PEN INJECTOR
3 refills | 0 days | Status: CP
Start: 2017-05-22 — End: 2017-11-25

## 2017-05-22 MED ORDER — DIAZEPAM 5 MG TABLET
ORAL_TABLET | Freq: Every day | ORAL | 0 refills | 0.00000 days | Status: CP | PRN
Start: 2017-05-22 — End: 2017-11-25

## 2017-05-22 MED ORDER — LISINOPRIL 5 MG TABLET
ORAL_TABLET | Freq: Every day | ORAL | 3 refills | 0 days | Status: CP
Start: 2017-05-22 — End: 2017-12-25

## 2017-05-27 ENCOUNTER — Observation Stay
Admission: EM | Admit: 2017-05-27 | Discharge: 2017-05-28 | Disposition: A | Discharge status: Home / Self Care | Payer: Medicare Other | Attending: Internal Medicine | Admitting: Internal Medicine

## 2017-05-27 ENCOUNTER — Emergency Department: Payer: Medicare Other

## 2017-05-27 DIAGNOSIS — R4182 Altered mental status, unspecified: Secondary | ICD-10-CM | POA: Diagnosis not present

## 2017-05-27 DIAGNOSIS — E1122 Type 2 diabetes mellitus with diabetic chronic kidney disease: Secondary | ICD-10-CM | POA: Diagnosis not present

## 2017-05-27 DIAGNOSIS — N183 Chronic kidney disease, stage 3 (moderate): Secondary | ICD-10-CM | POA: Insufficient documentation

## 2017-05-27 DIAGNOSIS — Z7984 Long term (current) use of oral hypoglycemic drugs: Secondary | ICD-10-CM | POA: Insufficient documentation

## 2017-05-27 DIAGNOSIS — E11649 Type 2 diabetes mellitus with hypoglycemia without coma: Principal | ICD-10-CM | POA: Insufficient documentation

## 2017-05-27 DIAGNOSIS — I129 Hypertensive chronic kidney disease with stage 1 through stage 4 chronic kidney disease, or unspecified chronic kidney disease: Secondary | ICD-10-CM | POA: Insufficient documentation

## 2017-05-27 DIAGNOSIS — K219 Gastro-esophageal reflux disease without esophagitis: Secondary | ICD-10-CM | POA: Diagnosis not present

## 2017-05-27 DIAGNOSIS — G4733 Obstructive sleep apnea (adult) (pediatric): Secondary | ICD-10-CM | POA: Insufficient documentation

## 2017-05-27 DIAGNOSIS — Z7982 Long term (current) use of aspirin: Secondary | ICD-10-CM | POA: Insufficient documentation

## 2017-05-27 DIAGNOSIS — G9341 Metabolic encephalopathy: Secondary | ICD-10-CM | POA: Diagnosis not present

## 2017-05-27 DIAGNOSIS — Z79899 Other long term (current) drug therapy: Secondary | ICD-10-CM | POA: Insufficient documentation

## 2017-05-27 DIAGNOSIS — E876 Hypokalemia: Secondary | ICD-10-CM | POA: Diagnosis not present

## 2017-05-27 DIAGNOSIS — E162 Hypoglycemia, unspecified: Secondary | ICD-10-CM | POA: Diagnosis present

## 2017-05-27 DIAGNOSIS — K5190 Ulcerative colitis, unspecified, without complications: Secondary | ICD-10-CM | POA: Diagnosis not present

## 2017-05-27 HISTORY — DX: Type 2 diabetes mellitus without complications: E11.9

## 2017-05-27 LAB — BASIC METABOLIC PANEL
Anion gap: 9 (ref 5–15)
BUN: 43 mg/dL — AB (ref 6–20)
CALCIUM: 9.2 mg/dL (ref 8.9–10.3)
CHLORIDE: 107 mmol/L (ref 101–111)
CO2: 19 mmol/L — ABNORMAL LOW (ref 22–32)
CREATININE: 1.43 mg/dL — AB (ref 0.61–1.24)
GFR, EST AFRICAN AMERICAN: 59 mL/min — AB (ref >60)
GFR, EST NON AFRICAN AMERICAN: 51 mL/min — AB (ref >60)
Glucose, Bld: 34 mg/dL — CL (ref 65–99)
Potassium: 4 mmol/L (ref 3.5–5.1)
SODIUM: 135 mmol/L (ref 135–145)

## 2017-05-27 LAB — CBC
HCT: 33.3 % — ABNORMAL LOW (ref 40.0–52.0)
HEMOGLOBIN: 11.9 g/dL — AB (ref 13.0–18.0)
MCH: 30.7 pg (ref 26.0–34.0)
MCHC: 35.8 g/dL (ref 32.0–36.0)
MCV: 85.6 fL (ref 80.0–100.0)
PLATELETS: 221 10*3/uL (ref 150–440)
RBC: 3.89 MIL/uL — ABNORMAL LOW (ref 4.40–5.90)
RDW: 13 % (ref 11.5–14.5)
WBC: 9.6 10*3/uL (ref 3.8–10.6)

## 2017-05-27 LAB — GLUCOSE, CAPILLARY
GLUCOSE-CAPILLARY: 160 mg/dL — AB (ref 65–99)
GLUCOSE-CAPILLARY: 26 mg/dL — AB (ref 65–99)
GLUCOSE-CAPILLARY: 118 mg/dL — AB (ref 65–99)
GLUCOSE-CAPILLARY: 121 mg/dL — AB (ref 65–99)
Glucose-Capillary: 65 mg/dL (ref 65–99)
Glucose-Capillary: 139 mg/dL — ABNORMAL HIGH (ref 65–99)
Glucose-Capillary: 32 mg/dL — CL (ref 65–99)

## 2017-05-27 LAB — PHOSPHORUS: Phosphorus: 3.1 mg/dL (ref 2.5–4.6)

## 2017-05-27 LAB — TSH: TSH: 2.755 u[IU]/mL (ref 0.350–4.500)

## 2017-05-27 LAB — MAGNESIUM: MAGNESIUM: 1.7 mg/dL (ref 1.7–2.4)

## 2017-05-27 LAB — TROPONIN I

## 2017-05-27 MED ORDER — IPRATROPIUM BROMIDE 0.02 % IN SOLN: 0.5000 mg | Freq: Four times a day (QID) | RESPIRATORY_TRACT | Status: DC | PRN

## 2017-05-27 MED ORDER — BISACODYL 5 MG PO TBEC: 5.0000 mg | DELAYED_RELEASE_TABLET | Freq: Every day | ORAL | Status: DC | PRN

## 2017-05-27 MED ORDER — MAGNESIUM CITRATE PO SOLN
1.0000 | Freq: Once | ORAL | Status: DC | PRN
  Filled 2017-05-27: qty 296

## 2017-05-27 MED ORDER — ZOLPIDEM TARTRATE 5 MG PO TABS: 5.0000 mg | ORAL_TABLET | Freq: Every evening | ORAL | Status: DC | PRN

## 2017-05-27 MED ORDER — ATORVASTATIN CALCIUM 20 MG PO TABS
40.0000 mg | ORAL_TABLET | Freq: Every day | ORAL | Status: DC
  Administered 2017-05-28: 40 mg via ORAL
  Filled 2017-05-27: qty 2

## 2017-05-27 MED ORDER — DEXTROSE 50 % IV SOLN
1.0000 | INTRAVENOUS | Status: AC
  Administered 2017-05-27: 18:00:00 via INTRAVENOUS
  Administered 2017-05-27: 50 mL via INTRAVENOUS

## 2017-05-27 MED ORDER — SODIUM CHLORIDE 4 MEQ/ML IV SOLN
Freq: Once | INTRAVENOUS | Status: DC
  Filled 2017-05-27: qty 1000

## 2017-05-27 MED ORDER — SODIUM CHLORIDE 4 MEQ/ML IV SOLN
Freq: Once | INTRAVENOUS | Status: AC
  Administered 2017-05-27: 20:00:00 via INTRAVENOUS
  Filled 2017-05-27: qty 1000

## 2017-05-27 MED ORDER — SENNOSIDES-DOCUSATE SODIUM 8.6-50 MG PO TABS: 1.0000 | ORAL_TABLET | Freq: Every evening | ORAL | Status: DC | PRN

## 2017-05-27 MED ORDER — DEXTROSE 50 % IV SOLN
INTRAVENOUS | Status: AC
  Filled 2017-05-27: qty 50

## 2017-05-27 MED ORDER — ACETAMINOPHEN 650 MG RE SUPP: 650.0000 mg | Freq: Four times a day (QID) | RECTAL | Status: DC | PRN

## 2017-05-27 MED ORDER — LIVING WELL WITH DIABETES BOOK
Freq: Once | Status: AC
  Administered 2017-05-27: 20:00:00
  Filled 2017-05-27: qty 1

## 2017-05-27 MED ORDER — ONDANSETRON HCL 4 MG/2ML IJ SOLN: 4.0000 mg | Freq: Four times a day (QID) | INTRAMUSCULAR | Status: DC | PRN

## 2017-05-27 MED ORDER — ACETAMINOPHEN 325 MG PO TABS: 650.0000 mg | ORAL_TABLET | Freq: Four times a day (QID) | ORAL | Status: DC | PRN

## 2017-05-27 MED ORDER — PANTOPRAZOLE SODIUM 40 MG PO TBEC
40.0000 mg | DELAYED_RELEASE_TABLET | Freq: Every day | ORAL | Status: DC
  Administered 2017-05-28: 40 mg via ORAL
  Filled 2017-05-27: qty 1

## 2017-05-27 MED ORDER — ONDANSETRON HCL 4 MG PO TABS: 4.0000 mg | ORAL_TABLET | Freq: Four times a day (QID) | ORAL | Status: DC | PRN

## 2017-05-27 MED ORDER — VENLAFAXINE HCL ER 75 MG PO CP24
150.0000 mg | ORAL_CAPSULE | Freq: Every day | ORAL | Status: DC
  Administered 2017-05-28: 150 mg via ORAL
  Filled 2017-05-27: qty 2

## 2017-05-27 MED ORDER — ENOXAPARIN SODIUM 40 MG/0.4ML ~~LOC~~ SOLN: 40.0000 mg | SUBCUTANEOUS | Status: DC

## 2017-05-27 MED ORDER — DEXTROSE-NACL 10-0.45 % IV SOLN: Freq: Once | INTRAVENOUS | Status: DC

## 2017-05-27 MED ORDER — ASPIRIN EC 81 MG PO TBEC
81.0000 mg | DELAYED_RELEASE_TABLET | Freq: Every day | ORAL | Status: DC
  Administered 2017-05-28: 81 mg via ORAL
  Filled 2017-05-27: qty 1

## 2017-05-27 MED ORDER — DEXTROSE-NACL 5-0.45 % IV SOLN
INTRAVENOUS | Status: DC
  Administered 2017-05-27: 1000 mL via INTRAVENOUS

## 2017-05-27 MED ORDER — LISINOPRIL 5 MG PO TABS
5.0000 mg | ORAL_TABLET | Freq: Every day | ORAL | Status: DC
  Administered 2017-05-28: 5 mg via ORAL
  Filled 2017-05-27: qty 1

## 2017-05-27 MED ORDER — SODIUM CHLORIDE 0.9% FLUSH
3.0000 mL | Freq: Two times a day (BID) | INTRAVENOUS | Status: DC
  Administered 2017-05-27 – 2017-05-28 (×2): 3 mL via INTRAVENOUS

## 2017-05-27 MED ORDER — ALBUTEROL SULFATE (2.5 MG/3ML) 0.083% IN NEBU: 2.5000 mg | INHALATION_SOLUTION | Freq: Four times a day (QID) | RESPIRATORY_TRACT | Status: DC | PRN

## 2017-05-27 MED ORDER — IBUPROFEN 400 MG PO TABS
400.0000 mg | ORAL_TABLET | Freq: Four times a day (QID) | ORAL | Status: DC | PRN
  Administered 2017-05-28: 400 mg via ORAL
  Filled 2017-05-27: qty 1

## 2017-05-27 MED ORDER — OXYCODONE HCL 5 MG PO TABS: 5.0000 mg | ORAL_TABLET | ORAL | Status: DC | PRN

## 2017-05-27 NOTE — ED Notes (Signed)
Blood sugar is now 160

## 2017-05-27 NOTE — ED Notes (Signed)
Apple juice and [place] sandwich taken.

## 2017-05-27 NOTE — ED Notes (Signed)
Pt up to bathroom with assistance.  Report called to icu nurse Selena Batten rn.  nsr on monitor.  Pt alert.  Speech clear.

## 2017-05-27 NOTE — ED Notes (Signed)
ED Provider at bedside. 

## 2017-05-27 NOTE — ED Notes (Signed)
Blood Sugar 26

## 2017-05-27 NOTE — H&P (Signed)
History and Physical   [facility] - [place] @ [facility] Admission History and Physical [employer], D.O.    Patient Name: [name] MR#: [id number] Date of Birth: Jun 09, 1956 Date of Admission: 05/27/2017  Referring MD/NP/PA: Dr. Jacqualine Code Primary Care Physician: Clementeen Graham, MD  Chief Complaint:  Chief Complaint  Patient presents with  . Altered Mental Status    HPI: [name] is a 61 y.o. male with a known history of diabetes, ulcerative colitis s/p colon resection, hypertension, PTSD, obstructive sleep apnea, anxiety disorder presents to the emergency department for evaluation of altered mental status.  Patient states that he has had intermittent episodes of dizziness and lightheadedness for the past 3-4 months however today he was found to have those symptoms as well as altered mental status and confusion.patient states that his only medication adjustment has been the addition of the toes out approximately 1 month ago and a recent (3 days ago) decrease in his lisinopril from 10 mm to 5 mg presumably because of the dizziness and lightheadedness. He has been compliant with his diet and medications. He also states that he has lost about 20 pounds in the past 3 months unintentionally.  Patient denies fevers/chills, chest pain, shortness of breath, N/V/C/D, abdominal pain, dysuria/frequency, changes in mental status.    Otherwise there has been no change in status. Patient has been taking medication as prescribed and there has been no recent change in medication or diet.  No recent antibiotics.  There has been no recent illness, hospitalizations, travel or sick contacts.    EMS/ED Course: in the emergency room patient was found to have a serum glucose of 34 and received 1 amp of D50 with improvements in his blood glucose, however he quickly became hypoglycemic again.  He rec'd a second amp of D50 and was then started on a D5 half normal saline drip at 100 cc per hour.. Medical  admission has been requested for further management of persistent hypoglycemia.  Review of Systems:  CONSTITUTIONAL: No fever/chills, fatigue, weight gain/loss, headache. EYES: No blurry or double vision. ENT: No tinnitus, postnasal drip, redness or soreness of the oropharynx. RESPIRATORY: No cough, dyspnea, wheeze.  No hemoptysis.  CARDIOVASCULAR: No chest pain, palpitations, syncope, orthopnea. No lower extremity edema.  GASTROINTESTINAL: No nausea, vomiting, abdominal pain, diarrhea, constipation.  No hematemesis, melena or hematochezia. GENITOURINARY: No dysuria, frequency, hematuria. ENDOCRINE: No polyuria or nocturia. No heat or cold intolerance. HEMATOLOGY: No anemia, bruising, bleeding. INTEGUMENTARY: No rashes, ulcers, lesions. MUSCULOSKELETAL: No arthritis, gout, dyspnea. NEUROLOGIC: positive weakness and altered mental status.No numbness, tingling, ataxia, seizure-type activity PSYCHIATRIC: No anxiety, depression, insomnia.   Past Medical History:  Diagnosis Date  . Diabetes mellitus without complication (HCC)   diabetes, ulcerative colitis, hypertension, PTSD, obstructive sleep apnea, anxiety disorder   Surgical History   Surgery Date Laterality Comments  CARPAL TUNNEL RELEASE     KNEE CARTILAGE SURGERY     reatna     SINUS SURGERY     COLECTOMY     CERVICAL SPINE SURGERY     NECK SURGERY     RETINAL DETACHMENT SURGERY     PR SIGMOIDOSCOPY,BIOPSY 10/22/2013  Procedure: SIGMOIDOSCOPY, FLEXIBLE; WITH BIOPSY, SINGLE OR MULTIPLE; Surgeon: Roxy Horseman, MD; [facility]; Service: Gastroenterology  COLECTOMY 1997    PR [place] EVAL INTSTINAL POUCH W/BX SINGLE/MULTIPLE 08/04/2014 N/A Procedure: ENDOSCOPIC EVAL OF SMALL INTESTINAL POUCH; DIAGNOSTIC, WITH BIOPSY; Surgeon: Jefm Miles, MD; Location: [facility]; Service: Gastroenterology  PR [place]  EVAL INTSTINAL POUCH W/BX SINGLE/MULTIPLE 9/         has no  tobacco, alcohol, and drug history on file.  No Known Allergies  Family History   Medical History Relation Name Comments  Depression Brother    Inflammatory bowel disease Brother    Arthritis Father    Depression Father    Glaucoma Father    Inflammatory bowel disease Father    Vision loss Father    Arthritis Maternal Grandfather    Cancer Maternal Grandfather    Arthritis Mother    Cancer Paternal Grandfather    Depression Sister    Inflammatory bowel disease Sister  Harding  Liver disease Sister      Prior to Admission medications   Medication Sig Start Date End Date Taking? Authorizing Provider  aspirin EC 81 MG tablet Take 81 mg by mouth daily. 08/02/15 04/10/29 Yes [provider]  atorvastatin (LIPITOR) 40 MG tablet Take 40 mg by mouth daily. 03/14/17  Yes [provider]  glipiZIDE (GLUCOTROL) 10 MG tablet Take 10 mg by mouth 2 (two) times daily. 03/10/17  Yes [provider]  lisinopril (PRINIVIL,ZESTRIL) 5 MG tablet Take 5 mg by mouth daily. 05/23/17  Yes [provider]  metFORMIN (GLUCOPHAGE) 1000 MG tablet Take 1,000 mg by mouth 2 (two) times daily. 04/08/17  Yes [provider]  omeprazole (PRILOSEC) 40 MG capsule Take 40 mg by mouth 2 (two) times daily. 05/23/17  Yes [provider]  venlafaxine XR (EFFEXOR-XR) 150 MG 24 hr capsule Take 150 mg by mouth daily. 03/20/17  Yes [provider]  VICTOZA 18 MG/3ML SOPN Inject 1.8 mg into the skin daily. 05/07/17  Yes [provider]    Physical Exam: Vitals:   05/27/17 1900 05/27/17 1916 05/27/17 1930 05/27/17 2000  BP: 105/72 122/76 114/74 117/69  Pulse:  89 87 89  Resp:   (!) 9 16  Temp:      TempSrc:      SpO2:  100% 100% 100%  Weight:      Height:        GENERAL: 61 y.o.-year-old white male patient, well-developed, well-nourished lying in the bed in no acute distress.  Pleasant and cooperative.   HEENT: Head atraumatic,  normocephalic. Pupils equal. Mucus membranes moist. NECK: Supple, full range of motion. No JVD, no bruit heard. No thyroid enlargement, no tenderness, no cervical lymphadenopathy. CHEST: Normal breath sounds bilaterally. No wheezing, rales, rhonchi or crackles. No use of accessory muscles of respiration.  No reproducible chest wall tenderness.  CARDIOVASCULAR: S1, S2 normal. No murmurs, rubs, or gallops. Cap refill <2 seconds. Pulses intact distally.  ABDOMEN: Soft, nondistended, nontender. No rebound, guarding, rigidity. Normoactive bowel sounds present in all four quadrants.  EXTREMITIES: No pedal edema, cyanosis, or clubbing. No calf tenderness or Homan's sign.  NEUROLOGIC: The patient is alert and oriented x 3. Cranial nerves II through XII are grossly intact with no focal sensorimotor deficit. PSYCHIATRIC:  Normal affect, mood, thought content. SKIN: Warm, dry, and intact without obvious rash, lesion, or ulcer.    Labs on Admission:  CBC:  Recent Labs Lab 05/27/17 1733  WBC 9.6  HGB 11.9*  HCT 33.3*  MCV 85.6  PLT 314   Basic Metabolic Panel:  Recent Labs Lab 05/27/17 1733  NA 135  K 4.0  CL 107  CO2 19*  GLUCOSE 34*  BUN 43*  CREATININE 1.43*  CALCIUM 9.2   GFR: Estimated Creatinine Clearance: 55.3 mL/min (A) (by C-G  formula based on SCr of 1.43 mg/dL (H)). Liver Function Tests: No results for input(s): AST, ALT, ALKPHOS, BILITOT, PROT, ALBUMIN in the last 168 hours. No results for input(s): LIPASE, AMYLASE in the last 168 hours. No results for input(s): AMMONIA in the last 168 hours. Coagulation Profile: No results for input(s): INR, PROTIME in the last 168 hours. Cardiac Enzymes:  Recent Labs Lab 05/27/17 1733  TROPONINI <0.03   BNP (last 3 results) No results for input(s): PROBNP in the last 8760 hours. HbA1C: No results for input(s): HGBA1C in the last 72 hours. CBG:  Recent Labs Lab 05/27/17 1743 05/27/17 1822 05/27/17 1911 05/27/17 1922  05/27/17 2013  GLUCAP 160* 65 32* 139* 118*   Lipid Profile: No results for input(s): CHOL, HDL, LDLCALC, TRIG, CHOLHDL, LDLDIRECT in the last 72 hours. Thyroid Function Tests: No results for input(s): TSH, T4TOTAL, FREET4, T3FREE, THYROIDAB in the last 72 hours. Anemia Panel: No results for input(s): VITAMINB12, FOLATE, FERRITIN, TIBC, IRON, RETICCTPCT in the last 72 hours. Urine analysis:    Component Value Date/Time   COLORURINE Yellow 10/22/2014 1107   APPEARANCEUR Clear 10/22/2014 1107   LABSPEC 1.015 10/22/2014 1107   PHURINE 6.0 10/22/2014 1107   GLUCOSEU Negative 10/22/2014 1107   HGBUR Negative 10/22/2014 1107   BILIRUBINUR Negative 10/22/2014 1107   KETONESUR Negative 10/22/2014 1107   PROTEINUR 30 mg/dL 10/22/2014 1107   NITRITE Negative 10/22/2014 1107   LEUKOCYTESUR Negative 10/22/2014 1107   Sepsis Labs: @LABRCNTIP (procalcitonin:4,lacticidven:4) )No results found for this or any previous visit (from the past 240 hour(s)).   Radiological Exams on Admission: Ct Head Wo Contrast  Result Date: 05/27/2017 CLINICAL DATA:  Altered level of consciousness. Hypoglycemia. No reported injury. EXAM: CT HEAD WITHOUT CONTRAST TECHNIQUE: Contiguous axial images were obtained from the base of the skull through the vertex without intravenous contrast. COMPARISON:  11/07/2012 head CT. FINDINGS: Brain: No evidence of parenchymal hemorrhage or extra-axial fluid collection. No mass lesion, mass effect, or midline shift. No CT evidence of acute infarction. Cerebral volume is age appropriate. No ventriculomegaly. Vascular: No acute abnormality. Skull: No evidence of calvarial fracture. Sinuses/Orbits: No fluid levels. Mucoperiosteal thickening in the ethmoidal air cells and left maxillary sinus. Other: Tiny partial right mastoid effusion, unchanged. The left mastoid air cells are unopacified. Partially visualized surgical hardware from ACDF in the lower cervical spine on the lateral scout  topogram. IMPRESSION: 1.  No evidence of acute intracranial abnormality. 2. Mild paranasal sinusitis, probably chronic. 3. Nonspecific chronic tiny partial right mastoid effusion. Electronically Signed   By: Ilona Sorrel M.D.   On: 05/27/2017 18:30    EKG: Normal sinus rhythm at 95 bpm with normal axis and nonspecific ST-T wave changes.   Assessment/Plan  This is a 61 y.o. male with a history of diabetes, ulcerative colitis s/p colon resection, hypertension, PTSD, obstructive sleep apnea, anxiety disorder now being admitted with:  #. Persistent hypoglycemia Admit to observation, stepdown, telemetry Continue IV dextrose 10% with one half normal saline at 100 cc/h Continue every 2 hour fingersticks Hold glipizide and Actos and metformin Check hemoglobin A1c and TSH Critical care consult has been requested. Discussed with Dr. Elsworth Soho at 9:55 PM  #. CK D, chronic and stable Check BMP in a.m.  #. History of hypertension Continue lisinopril  #. History of hyperlipidemia - Continue atorvastatin  #. History of GERD - Continue Protonix for Prilosec  #. History of anxiety disorder - Continue venlafaxine  #. History of OSA - Continue CPAP at night  Admission status: observation, telemetry, stepdown IV Fluids: D10 half-normal saline Diet/Nutrition: heart healthy, carb controlled Consults called: critical care  DVT Px: Lovenox, SCDs and early ambulation. Code Status: Full Code  Disposition Plan: To home in less than 24 hours  All the records are reviewed and case discussed with ED provider. Management plans discussed with the patient and/or family who express understanding and agree with plan of care.  Keyla Milone D.O. on 05/27/2017 at 9:15 PM Between 7am to 6pm - Pager - [phone] After 6pm go to [email or web address] - [employer] [facility] [place] [facility] Office [phone] CC: Primary care physician; Clementeen Graham, MD   05/27/2017, 9:15 PM

## 2017-05-27 NOTE — ED Notes (Signed)
Pt is now talking to the nurses and MD at bedside.  Pt is now A&O

## 2017-05-27 NOTE — ED Provider Notes (Signed)
[facility] Emergency Department Provider Note   ____________________________________________   First MD Initiated Contact with Patient 05/27/17 1740     (approximate)  I have reviewed the triage vital signs and the nursing notes.   HISTORY  Chief Complaint Altered Mental Status  EM caveat: Patient altered metal status confused  HPI [name] is a 61 y.o. male history of diabetes  Family reports he was out shopping, he was acting fine. Then about 30 minutes ago while at home he began seeming confused, they weren't sure of the cause and have brought him here because he continues to seem very confused.  Report he was in his normal health until about 30 minutes ago and was out walking and shopping   Past Medical History:  Diagnosis Date  . Diabetes mellitus without complication ([place])     There are no active problems to display for this patient.   History reviewed. No pertinent surgical history.  Prior to Admission medications   Medication Sig Start Date End Date Taking? Authorizing Provider  aspirin EC 81 MG tablet Take 81 mg by mouth daily. 08/02/15 04/10/29 Yes [provider]  atorvastatin (LIPITOR) 40 MG tablet Take 40 mg by mouth daily. 03/14/17  Yes [provider]  glipiZIDE (GLUCOTROL) 10 MG tablet Take 10 mg by mouth 2 (two) times daily. 03/10/17  Yes [provider]  lisinopril (PRINIVIL,ZESTRIL) 5 MG tablet Take 5 mg by mouth daily. 05/23/17  Yes [provider]  metFORMIN (GLUCOPHAGE) 1000 MG tablet Take 1,000 mg by mouth 2 (two) times daily. 04/08/17  Yes [provider]  omeprazole (PRILOSEC) 40 MG capsule Take 40 mg by mouth 2 (two) times daily. 05/23/17  Yes [provider]  venlafaxine XR (EFFEXOR-XR) 150 MG 24 hr capsule Take 150 mg by mouth daily. 03/20/17  Yes [provider]  VICTOZA 18 MG/3ML SOPN Inject 1.8 mg into the skin daily. 05/07/17  Yes [provider]    Allergies Patient has no known allergies.  History reviewed. No pertinent family history.  Social History Social History  Substance Use Topics  . Smoking status: Not on file  . Smokeless tobacco: Not on file  . Alcohol use Not on file    Review of Systems  EM caveat  ____________________________________________   PHYSICAL EXAM:  VITAL SIGNS: ED Triage Vitals  Enc Vitals Group     BP 05/27/17 1741 (!) 113/55     Pulse Rate 05/27/17 1741 95     Resp 05/27/17 1741 18     Temp 05/27/17 1741 97.6 F (36.4 C)     Temp Source 05/27/17 1741 Oral     SpO2 05/27/17 1741 100 %     Weight 05/27/17 1739 188 lb (85.3 kg)     Height 05/27/17 1739 5' 10"  (1.778 m)     Head Circumference --      Peak Flow --      Pain Score --      Pain Loc --      Pain Edu? --      Excl. in [place]? --     Constitutional: Alert and confused, disoriented. Patient will not follow any commands and is moving his arms and legs kicking them about and on order fashion Eyes: Conjunctivae are normal. Head: Atraumatic. Nose: No congestion/rhinnorhea. Mouth/Throat: Mucous membranes are dry. Neck: No stridor.   Cardiovascular: Normal rate, regular rhythm. Grossly normal heart sounds.  Good peripheral circulation. Respiratory: Normal respiratory effort.  No retractions. Lungs CTAB. Gastrointestinal: Soft and nontender. No distention. Musculoskeletal: No lower extremity tenderness nor edema. Neurologic:  moving all extremities, confused, not following commands. Incomprehensible sounds opening and closing his mouth.  Skin:  Skin is warm, diaphoretic and intact. No rash noted. Psychiatric: Mood and affect are abnormal  ____________________________________________   LABS (all labs ordered are listed, but only abnormal results are displayed)  Labs Reviewed  GLUCOSE, CAPILLARY - Abnormal; Notable for the following:       Result Value   Glucose-Capillary 26 (*)    All other components within normal  limits  CBC - Abnormal; Notable for the following:    RBC 3.89 (*)    Hemoglobin 11.9 (*)    HCT 33.3 (*)    All other components within normal limits  BASIC METABOLIC PANEL - Abnormal; Notable for the following:    CO2 19 (*)    Glucose, Bld 34 (*)    BUN 43 (*)    Creatinine, Ser 1.43 (*)    GFR calc non Af Amer 51 (*)    GFR calc Af Amer 59 (*)    All other components within normal limits  GLUCOSE, CAPILLARY - Abnormal; Notable for the following:    Glucose-Capillary 160 (*)    All other components within normal limits  GLUCOSE, CAPILLARY - Abnormal; Notable for the following:    Glucose-Capillary 32 (*)    All other components within normal limits  GLUCOSE, CAPILLARY - Abnormal; Notable for the following:    Glucose-Capillary 139 (*)    All other components within normal limits  TROPONIN I  GLUCOSE, CAPILLARY  CBG MONITORING, ED  CBG MONITORING, ED  CBG MONITORING, ED  CBG MONITORING, ED  CBG MONITORING, ED  CBG MONITORING, ED  CBG MONITORING, ED  CBG MONITORING, ED   ____________________________________________  EKG  reviewed injury by me at 1800 Heart rate 95 QRS 90 QTc 460 Normal sinus rhythm, occasional PAC, no evidence of acute ischemia ____________________________________________  RADIOLOGY  Ct Head Wo Contrast  Result Date: 05/27/2017 CLINICAL DATA:  Altered level of consciousness. Hypoglycemia. No reported injury. EXAM: CT HEAD WITHOUT CONTRAST TECHNIQUE: Contiguous axial images were obtained from the base of the skull through the vertex without intravenous contrast. COMPARISON:  11/07/2012 head CT. FINDINGS: Brain: No evidence of parenchymal hemorrhage or extra-axial fluid collection. No mass lesion, mass effect, or midline shift. No CT evidence of acute infarction. Cerebral volume is age appropriate. No ventriculomegaly. Vascular: No acute abnormality. Skull: No evidence of calvarial fracture. Sinuses/Orbits: No fluid levels. Mucoperiosteal thickening in  the ethmoidal air cells and left maxillary sinus. Other: Tiny partial right mastoid effusion, unchanged. The left mastoid air cells are unopacified. Partially visualized surgical hardware from ACDF in the lower cervical spine on the lateral scout topogram. IMPRESSION: 1.  No evidence of acute intracranial abnormality. 2. Mild paranasal sinusitis, probably chronic. 3. Nonspecific chronic tiny partial right mastoid effusion. Electronically Signed   By: Ilona Sorrel M.D.   On: 05/27/2017 18:30    ____________________________________________   PROCEDURES  Procedure(s) performed: None  Procedures  Critical Care performed: Yes, see critical care note(s)  CRITICAL CARE Performed by: Delman Kitten   Total critical care time: 40 minutes  Critical care time was exclusive of separately billable procedures and treating other patients.  Critical care was necessary to treat or prevent imminent or life-threatening deterioration.  Critical care was time spent personally by me on the following activities: development of treatment plan with patient and/or  surrogate as well as nursing, discussions with consultants, evaluation of patient's response to treatment, examination of patient, obtaining history from patient or surrogate, ordering and performing treatments and interventions, ordering and review of laboratory studies, ordering and review of radiographic studies, pulse oximetry and re-evaluation of patient's condition.  ____________________________________________   INITIAL IMPRESSION / ASSESSMENT AND PLAN / ED COURSE  Pertinent labs & imaging results that were available during my care of the patient were reviewed by me and considered in my medical decision making (see chart for details).  altered mental status.  Clinical Course as of May 27 1941  Mon May 27, 2017  1755 patient is now alert and oriented. Appears much improved. He is now eating and drinking well and denies any ongoing symptoms  or concerns.  Removal in all extremities normally. Normal cranial nerve exam. No weakness in the arms or legs. Alert and well-oriented.  [MQ]    Clinical Course User Index [MQ] Delman Kitten, MD   Patient with a history of diabetes. Takes Victoza, oral sulfonylurea now presented with low blood sugarlive-in clear etiology. He and his family report no overdose. After receiving D50 he became alert and oriented, reports he is not aware of any changes medicine other than a reduction in his lisinopril which was advised a few days ago at [facility]. He discontinued his diabetes medicines normally, but has noticed several times in the last few weeks that he'll feel a little bit off lightheaded, have to eat something sugary in order to feel better and thinks his sugar may be running low at times   ----------------------------------------- 7:15 PM on 05/27/2017 -----------------------------------------  The patient noted be acting confused again. Blood sugar noted to be 32. Another amp of D50 is administered at this time. I have contacted [place] control who advised.  Spoke with Ebony Hail of poison, advises initiate dextrose infusionD10 1 half normal at 100 mL an hour. If this does not continue to demonstrate stabilization of his glucose then consider adding octreotide. Discussed with the patient and his family and wife, they're agreeable with plan for admission. He is now alert and oriented once again in no distress and drinking orange jice with CBG 120-130  ____________________________________________   FINAL CLINICAL IMPRESSION(S) / ED DIAGNOSES  Final diagnoses:  Hypoglycemia associated with type 2 diabetes mellitus ([place])      NEW MEDICATIONS STARTED DURING THIS VISIT:  New Prescriptions   No medications on file     Note:  This document was prepared using Dragon voice recognition software and may include unintentional dictation errors.     Delman Kitten, MD 05/27/17 [phone]

## 2017-05-27 NOTE — ED Notes (Addendum)
fsbs 32.  md with pt.  meds given.  Family with pt  nsr on monitor.  Skin warm and dry.  Pt states I feel lightheaded

## 2017-05-27 NOTE — Progress Notes (Signed)
[facility]Link Physician-Brief Progress Note Patient Name: [name] DOB: 1956-03-27 MRN: [id number]   Date of Service  05/27/2017  HPI/Events of Note  61 yo male admitted with hypoglycemia related to oral agent. Now on D5 0.45 Last blood glucose = 121. NaCl IV infusion. VSS.   eICU Interventions  Continue present management. No new orders.      Intervention Category Evaluation Type: New Patient Evaluation  Lysle Dingwall 05/27/2017, 11:26 PM

## 2017-05-27 NOTE — ED Triage Notes (Addendum)
Pt came with family to the front and was pulled out of the car.  Pts blood sugar was 26 on arrival.  Pt is experienced altered mental status and is not responding to commands.

## 2017-05-27 NOTE — ED Notes (Signed)
fsbs 121.  Pt alert.  Iv fluids infusing.   Family with pt.

## 2017-05-27 NOTE — ED Notes (Signed)
FSBS 65, chocolate milk given, 150 calories.

## 2017-05-27 NOTE — ED Notes (Signed)
pts legs are currently shaking beyond patients control

## 2017-05-27 NOTE — ED Notes (Signed)
fsbs 139 after dextrose.

## 2017-05-27 NOTE — Consult Note (Signed)
Name: [name] MRN: [id number] DOB: 07-21-1956    ADMISSION DATE:  05/27/2017 CONSULTATION DATE: 05/27/2017  REFERRING MD : Dr. Ara Kussmaul  CHIEF COMPLAINT: Altered Mental Status  BRIEF PATIENT DESCRIPTION:  61 yo male admitted 09/3 with altered mental status secondary to hypoglycemia currently on D10W continuous infusion  SIGNIFICANT EVENTS  09/3-Pt admitted to the Stepdown Unit   STUDIES:  CT Head 09/3>>No evidence of acute intracranial abnormality. Mild paranasal sinusitis, probably chronic. Nonspecific chronic tiny partial right mastoid effusion.  HISTORY OF PRESENT ILLNESS:   This is a 61 yo male with a PMH of Type II Diabetes Mellitus, Syncope,Traumatic Brain Injury, PTSD, OSA, Ulcerative Colitis, and Essential HTN. He presented to [facility] ER 09/3 with altered mental status onset of symptoms today.  According to his family the pt was out shopping in his usual state of health then 30 minutes after arriving home he developed confusion prompting current ER visit.  In the ER lab results revealed a serum glucose of 34, therefore he was given 1 amp of D50W and he became alert with improvement of CBG reading.  Poison Control was notified by ER physician although pts. family did not report an overdose. Per ER notes the pt stated he has noticed over the last few weeks he has become lightheaded several times.  According to the pt his PCP added Victoza 1 month ago due to hyperglycemia.  He was subsequently admitted by hospitalist team to the stepdown unit for further workup and treatment PCCM consulted   PAST MEDICAL HISTORY :   has a past medical history of Diabetes mellitus without complication ([place]).  has no past surgical history on file. Prior to Admission medications   Medication Sig Start Date End Date Taking? Authorizing Provider  aspirin EC 81 MG tablet Take 81 mg by mouth daily. 08/02/15 04/10/29 Yes [provider]  atorvastatin (LIPITOR) 40 MG tablet Take 40 mg by mouth  daily. 03/14/17  Yes [provider]  glipiZIDE (GLUCOTROL) 10 MG tablet Take 10 mg by mouth 2 (two) times daily. 03/10/17  Yes [provider]  lisinopril (PRINIVIL,ZESTRIL) 5 MG tablet Take 5 mg by mouth daily. 05/23/17  Yes [provider]  metFORMIN (GLUCOPHAGE) 1000 MG tablet Take 1,000 mg by mouth 2 (two) times daily. 04/08/17  Yes [provider]  omeprazole (PRILOSEC) 40 MG capsule Take 40 mg by mouth 2 (two) times daily. 05/23/17  Yes [provider]  venlafaxine XR (EFFEXOR-XR) 150 MG 24 hr capsule Take 150 mg by mouth daily. 03/20/17  Yes [provider]  VICTOZA 18 MG/3ML SOPN Inject 1.8 mg into the skin daily. 05/07/17  Yes [provider]   No Known Allergies  FAMILY HISTORY:  family history is not on file. SOCIAL HISTORY:    REVIEW OF SYSTEMS: Positives in BOLD  Constitutional: fever, chills, weight loss, malaise/fatigue and diaphoresis.  HENT: Negative for hearing loss, ear pain, nosebleeds, congestion, sore throat, neck pain, tinnitus and ear discharge.   Eyes: Negative for blurred vision, double vision, photophobia, pain, discharge and redness.  Respiratory: Negative for cough, hemoptysis, sputum production, shortness of breath, wheezing and stridor.   Cardiovascular: Negative for chest pain, palpitations, orthopnea, claudication, leg swelling and PND.  Gastrointestinal: Negative for heartburn, nausea, vomiting, abdominal pain, diarrhea, constipation, blood in stool and melena.  Genitourinary: Negative for dysuria, urgency, frequency, hematuria and flank pain.  Musculoskeletal: Negative for myalgias, back pain, joint pain and falls.  Skin: Negative for itching and rash.  Neurological:  dizziness, tingling, tremors, sensory change, speech change, focal weakness, seizures, loss of consciousness, weakness and headaches.  Endo/Heme/Allergies: Negative for environmental allergies and polydipsia. Does not bruise/bleed  easily.  SUBJECTIVE:  No complaints at this time.  VITAL SIGNS: Temp:  [97.6 F (36.4 C)] 97.6 F (36.4 C) (09/03 1741) Pulse Rate:  [87-95] 89 (09/03 2000) Resp:  [9-18] 17 (09/03 2101) BP: (101-130)/(55-76) 117/66 (09/03 2101) SpO2:  [100 %] 100 % (09/03 2000) Weight:  [85.3 kg (188 lb)] 85.3 kg (188 lb) (09/03 1739)  PHYSICAL EXAMINATION: General: well developed, well nourished Caucasian male, NAD  Neuro: alert and oriented, follows commands  HEENT: supple, no JVD  Cardiovascular: nsr, s1s2, no M/R/G Lungs: clear throughout, even, non labored Abdomen: +BS x4, soft, obese, non tender, non distended  Musculoskeletal: normal bulk and tone, no edema Skin: intact no rashes or lesions    Recent Labs Lab 05/27/17 1733  NA 135  K 4.0  CL 107  CO2 19*  BUN 43*  CREATININE 1.43*  GLUCOSE 34*    Recent Labs Lab 05/27/17 1733  HGB 11.9*  HCT 33.3*  WBC 9.6  PLT 221   Ct Head Wo Contrast  Result Date: 05/27/2017 CLINICAL DATA:  Altered level of consciousness. Hypoglycemia. No reported injury. EXAM: CT HEAD WITHOUT CONTRAST TECHNIQUE: Contiguous axial images were obtained from the base of the skull through the vertex without intravenous contrast. COMPARISON:  11/07/2012 head CT. FINDINGS: Brain: No evidence of parenchymal hemorrhage or extra-axial fluid collection. No mass lesion, mass effect, or midline shift. No CT evidence of acute infarction. Cerebral volume is age appropriate. No ventriculomegaly. Vascular: No acute abnormality. Skull: No evidence of calvarial fracture. Sinuses/Orbits: No fluid levels. Mucoperiosteal thickening in the ethmoidal air cells and left maxillary sinus. Other: Tiny partial right mastoid effusion, unchanged. The left mastoid air cells are unopacified. Partially visualized surgical hardware from ACDF in the lower cervical spine on the lateral scout topogram. IMPRESSION: 1.  No evidence of acute intracranial abnormality. 2. Mild paranasal sinusitis,  probably chronic. 3. Nonspecific chronic tiny partial right mastoid effusion. Electronically Signed   By: Ilona Sorrel M.D.   On: 05/27/2017 18:30    ASSESSMENT / PLAN: Acute Encephalopathy secondary to hypoglycemia  Hx: Diabetes Mellitus, OSA, Traumatic Brain Injury, Syncope, and HTN  P: Supplemental O2 to maintain O2 sats >92% Continue maintenance fluid with dextrose @100  ml/hr CBG's q1hr until 4 consecutive readings >90 Hypoglycemic protocol Hold outpatient glipizide, metformin, and victoza  Neuro Checks  Continue outpatient aspirin, atorvastatin, and lisinopril  Lovenox for VTE prophylaxis  Trend CBC Continuous telemetry monitoring   Marda Stalker, [place] Pager [phone] (please enter 7 digits) PCCM Consult Pager [phone] (please enter 7 digits)

## 2017-05-28 LAB — GLUCOSE, CAPILLARY
GLUCOSE-CAPILLARY: 194 mg/dL — AB (ref 65–99)
Glucose-Capillary: 104 mg/dL — ABNORMAL HIGH (ref 65–99)
Glucose-Capillary: 110 mg/dL — ABNORMAL HIGH (ref 65–99)
Glucose-Capillary: 157 mg/dL — ABNORMAL HIGH (ref 65–99)
Glucose-Capillary: 191 mg/dL — ABNORMAL HIGH (ref 65–99)
Glucose-Capillary: 198 mg/dL — ABNORMAL HIGH (ref 65–99)
Glucose-Capillary: 228 mg/dL — ABNORMAL HIGH (ref 65–99)
Glucose-Capillary: 232 mg/dL — ABNORMAL HIGH (ref 65–99)
Glucose-Capillary: 94 mg/dL (ref 65–99)

## 2017-05-28 LAB — BASIC METABOLIC PANEL
Anion gap: 4 — ABNORMAL LOW (ref 5–15)
BUN: 35 mg/dL — AB (ref 6–20)
CHLORIDE: 109 mmol/L (ref 101–111)
CO2: 20 mmol/L — AB (ref 22–32)
CREATININE: 1.15 mg/dL (ref 0.61–1.24)
Calcium: 8.3 mg/dL — ABNORMAL LOW (ref 8.9–10.3)
GFR calc Af Amer: >60 mL/min (ref >60)
Glucose, Bld: 170 mg/dL — ABNORMAL HIGH (ref 65–99)
POTASSIUM: 4.6 mmol/L (ref 3.5–5.1)
SODIUM: 133 mmol/L — AB (ref 135–145)

## 2017-05-28 LAB — MRSA PCR SCREENING: MRSA BY PCR: POSITIVE — AB

## 2017-05-28 LAB — CBC
HEMATOCRIT: 30.6 % — AB (ref 40.0–52.0)
Hemoglobin: 10.8 g/dL — ABNORMAL LOW (ref 13.0–18.0)
MCH: 30.7 pg (ref 26.0–34.0)
MCHC: 35.4 g/dL (ref 32.0–36.0)
MCV: 86.6 fL (ref 80.0–100.0)
PLATELETS: 168 10*3/uL (ref 150–440)
RBC: 3.53 MIL/uL — ABNORMAL LOW (ref 4.40–5.90)
RDW: 12.9 % (ref 11.5–14.5)
WBC: 5.2 10*3/uL (ref 3.8–10.6)

## 2017-05-28 LAB — HEMOGLOBIN A1C
HEMOGLOBIN A1C: 5.6 % (ref 4.8–5.6)
MEAN PLASMA GLUCOSE: 114.02 mg/dL

## 2017-05-28 MED ORDER — CHLORHEXIDINE GLUCONATE CLOTH 2 % EX PADS
6.0000 | MEDICATED_PAD | Freq: Every day | CUTANEOUS | Status: DC
  Administered 2017-05-28: 6 via TOPICAL

## 2017-05-28 MED ORDER — MUPIROCIN 2 % EX OINT
1.0000 "application " | TOPICAL_OINTMENT | Freq: Two times a day (BID) | CUTANEOUS | Status: DC
  Administered 2017-05-28 (×2): 1 via NASAL
  Filled 2017-05-28: qty 22

## 2017-05-28 NOTE — Evaluation (Signed)
Physical Therapy Evaluation Patient Details Name: [name] MRN: [id number] DOB: 12-23-1955 Today's Date: 05/28/2017   History of Present Illness  61 y.o. male with a known history of diabetes, ulcerative colitis s/p colon resection, hypertension, PTSD, obstructive sleep apnea, anxiety disorder presents to the emergency department for evaluation of altered mental status.  Patient states that he has had intermittent episodes of dizziness and lightheadedness for the past 3-4 months however today he was found to have those symptoms as well as altered mental status and confusion.  Pt with glucose in the 30s on arrival, feeling better at time of exam.  Clinical Impression  Pt did well with ambulation and showed good confidence, safety, balance and overall mobility.  His vitals remained stable with the effort and he agreed that despite not fully feeling back to baseline that he will be safe to return home and does not require further PT intervention.  Encouraged pt to do some walking with nursing while here in the hospital.    Follow Up Recommendations No PT follow up    Equipment Recommendations       Recommendations for Other Services       Precautions / Restrictions Restrictions Weight Bearing Restrictions: No      Mobility  Bed Mobility Overal bed mobility: Independent                Transfers Overall transfer level: Independent               General transfer comment: Pt able to rise to standing w/o assist and w/o UE use  Ambulation/Gait Ambulation/Gait assistance: Supervision Ambulation Distance (Feet): 400 Feet Assistive device: None       General Gait Details: Pt walked with consistent and safe cadence and had no significat safety concerns.  Overall did well and near his baseline.   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     Hand Dominance        Extremity/Trunk Assessment   Upper Extremity Assessment Upper Extremity Assessment: Overall WFL for tasks assessed    Lower Extremity Assessment Lower Extremity Assessment: Overall WFL for tasks assessed       Communication   Communication: No difficulties  Cognition Arousal/Alertness: Awake/alert Behavior During Therapy: WFL for tasks assessed/performed Overall Cognitive Status: Within Functional Limits for tasks assessed                                        General Comments      Exercises     Assessment/Plan    PT Assessment Patent does not  need any further PT services  PT Problem List         PT Treatment Interventions      PT Goals (Current goals can be found in the Care Plan section)  Acute Rehab PT Goals Patient Stated Goal: go home PT Goal Formulation: All assessment and education complete, DC therapy    Frequency     Barriers to discharge        Co-evaluation               AM-PAC PT "6 Clicks" Daily Activity  Outcome Measure Difficulty turning over in bed (including adjusting bedclothes, sheets and blankets)?: None Difficulty moving from lying on back to sitting on the side of the bed? : None Difficulty sitting down on and standing up from a chair with arms (e.g., wheelchair, bedside commode, etc,.)?: None Help needed moving to and from a bed to chair (including a wheelchair)?: None Help needed walking in hospital room?: None Help needed climbing 3-5 steps with a  railing? : None 6 Click Score: 24    End of Session Equipment Utilized During Treatment: Gait belt Activity Tolerance: Patient tolerated treatment well Patient left: in chair;with call bell/phone within reach   PT Visit Diagnosis: Difficulty in walking, not elsewhere classified (R26.2)    Time: 4239-5320 PT Time Calculation (min) (ACUTE ONLY): 29 min   Charges:   PT Evaluation $PT Eval Low Complexity: 1 Low     PT G CodesKreg Shropshire, DPT 05/28/2017, 11:38 AM

## 2017-05-28 NOTE — Progress Notes (Signed)
MD inform to take pt off D5 1/2 45%ns and check pt cbg q1 x 4hrs to see how he does. Relayed info to day shift nurse.

## 2017-05-28 NOTE — Discharge Summary (Signed)
Physician Discharge Summary  Patient ID: [name] MRN: [id number] DOB/AGE: September 26, 1955 61 y.o.  Admit date: 05/27/2017 Discharge date: 05/28/2017  Admission Diagnoses:  Discharge Diagnoses:  Active Problems:   Hypoglycemia   Discharged Condition: hypoglycemia Hospital Course: D5 infusion, close FSBS   Discharge Exam: Blood pressure (!) 109/57, pulse 91, temperature (!) 97.5 F (36.4 C), temperature source Oral, resp. rate 15, height 5' 10"  (1.778 m), weight 188 lb (85.3 kg), SpO2 98 %. Physical Examination:   GENERAL:NAD, no fevers, chills, no weakness no fatigue HEAD: Normocephalic, atraumatic.  EYES: Pupils equal, round, reactive to light. Extraocular muscles intact. No scleral icterus.  MOUTH: Moist mucosal membrane. Dentition intact. No abscess noted.  EAR, NOSE, THROAT: Clear without exudates. No external lesions.  NECK: Supple. No thyromegaly. No nodules. No JVD.  PULMONARY: Diffuse coarse rhonchi right sided +wheezes CARDIOVASCULAR: S1 and S2. Regular rate and rhythm. No murmurs, rubs, or gallops. No edema. Pedal pulses 2+ bilaterally.  GASTROINTESTINAL: Soft, nontender, nondistended. No masses. Positive bowel sounds. No hepatosplenomegaly.  MUSCULOSKELETAL: No swelling, clubbing, or edema. Range of motion full in all extremities.  NEUROLOGIC: Cranial nerves II through XII are intact. No gross focal neurological deficits. Sensation intact. Reflexes intact.  PSYCHIATRIC: Mood, affect within normal limits. The patient is awake, alert and oriented x 3. Insight, judgment intact.  ALL OTHER ROS ARE NEGATIVE      Disposition: home   Allergies as of 05/28/2017   No Known Allergies   Current Facility-Administered Medications:  .  albuterol (PROVENTIL) (2.5 MG/3ML) 0.083% nebulizer solution 2.5 mg, 2.5 mg, Nebulization, Q6H PRN, Hugelmeyer, Alexis, DO .  aspirin EC tablet 81 mg, 81 mg, Oral, Daily, Hugelmeyer, Alexis, DO, 81 mg at 05/28/17 1000 .  atorvastatin (LIPITOR)  tablet 40 mg, 40 mg, Oral, Daily, Hugelmeyer, Alexis, DO, 40 mg at 05/28/17 1000 .  bisacodyl (DULCOLAX) EC tablet 5 mg, 5 mg, Oral, Daily PRN, Hugelmeyer, Alexis, DO .  Chlorhexidine Gluconate Cloth 2 % PADS 6 each, 6 each, Topical, Q0600, Hugelmeyer, Alexis, DO, 6 each at 05/28/17 0527 .  dextrose 5 %-0.45 % sodium chloride infusion, , Intravenous, Continuous, Hugelmeyer, Alexis, DO, Stopped at 05/28/17 0750 .  enoxaparin (LOVENOX) injection 40 mg, 40 mg, Subcutaneous, Q24H, Hugelmeyer, Alexis, DO .  ibuprofen (ADVIL,MOTRIN) tablet 400 mg, 400 mg, Oral, Q6H PRN, Awilda Bill, NP, 400 mg at 05/28/17 0053 .  ipratropium (ATROVENT) nebulizer solution 0.5 mg, 0.5 mg, Nebulization, Q6H PRN, Hugelmeyer, Alexis, DO .  lisinopril (PRINIVIL,ZESTRIL) tablet 5 mg, 5 mg, Oral, Daily, Hugelmeyer, Alexis, DO, 5 mg at 05/28/17 1001 .  magnesium citrate solution 1 Bottle, 1 Bottle, Oral, Once PRN, Hugelmeyer, Alexis, DO .  mupirocin ointment (BACTROBAN) 2 % 1 application, 1 application, Nasal, BID, Hugelmeyer, Alexis, DO, 1 application at 18/84/16 1000 .  ondansetron (ZOFRAN) tablet 4 mg, 4 mg, Oral, Q6H PRN **OR** ondansetron (ZOFRAN) injection 4 mg, 4 mg, Intravenous, Q6H PRN, Hugelmeyer, Alexis, DO .  oxyCODONE (Oxy IR/ROXICODONE) immediate release tablet 5 mg, 5 mg, Oral, Q4H PRN, Hugelmeyer, Alexis, DO .  pantoprazole (PROTONIX) EC tablet 40 mg, 40 mg, Oral, Daily, Hugelmeyer, Alexis, DO, 40 mg at 05/28/17 1000 .  senna-docusate (Senokot-S) tablet 1 tablet, 1 tablet, Oral, QHS PRN, Hugelmeyer, Alexis, DO .  sodium chloride flush (NS) 0.9 % injection 3 mL, 3 mL, Intravenous, Q12H, Hugelmeyer, Alexis, DO, 3 mL at 05/28/17 1001 .  venlafaxine XR (EFFEXOR-XR) 24 hr capsule 150 mg, 150 mg, Oral, Daily, Hugelmeyer, Alexis, DO, 150 mg at  05/28/17 1000 .  zolpidem (AMBIEN) tablet 5 mg, 5 mg, Oral, QHS PRN,MR X 1, Hugelmeyer, Alexis, DO      Signed: Julen Rubert 05/28/2017, 11:52 AM

## 2017-05-28 NOTE — Progress Notes (Signed)
[facility] [place] NAME: Sumeet Geter    MR#:  [id number]  DATE OF BIRTH:  02/04/1956  SUBJECTIVE:   Doing well this am   REVIEW OF SYSTEMS:    Review of Systems  Constitutional: Negative for fever, chills weight loss HENT: Negative for ear pain, nosebleeds, congestion, facial swelling, rhinorrhea, neck pain, neck stiffness and ear discharge.   Respiratory: Negative for cough, shortness of breath, wheezing  Cardiovascular: Negative for chest pain, palpitations and leg swelling.  Gastrointestinal: Negative for heartburn, abdominal pain, vomiting, diarrhea or consitpation Genitourinary: Negative for dysuria, urgency, frequency, hematuria Musculoskeletal: Negative for back pain or joint pain Neurological: Negative for dizziness, seizures, syncope, focal weakness,  numbness and headaches.  Hematological: Does not bruise/bleed easily.  Psychiatric/Behavioral: Negative for hallucinations, confusion, dysphoric mood    Tolerating Diet: yes      DRUG ALLERGIES:  No Known Allergies  VITALS:  Blood pressure 111/63, pulse 91, temperature (!) 97.5 F (36.4 C), temperature source Oral, resp. rate 15, height 5' 10"  (1.778 m), weight 85.3 kg (188 lb), SpO2 98 %.  PHYSICAL EXAMINATION:  Constitutional: Appears well-developed and well-nourished. No distress. HENT: Normocephalic. Marland Kitchen Oropharynx is clear and moist.  Eyes: Conjunctivae and EOM are normal. PERRLA, no scleral icterus.  Neck: Normal ROM. Neck supple. No JVD. No tracheal deviation. CVS: RRR, S1/S2 +, no murmurs, no gallops, no carotid bruit.  Pulmonary: Effort and breath sounds normal, no stridor, rhonchi, wheezes, rales.  Abdominal: Soft. BS +,  no distension, tenderness, rebound or guarding.  Musculoskeletal: Normal range of motion. No edema and no tenderness.  Neuro: Alert. CN 2-12 grossly intact. No focal deficits. Skin: Skin is warm and dry. No rash noted. Psychiatric: Normal  mood and affect.      LABORATORY PANEL:   CBC  Recent Labs Lab 05/28/17 0324  WBC 5.2  HGB 10.8*  HCT 30.6*  PLT 168   ------------------------------------------------------------------------------------------------------------------  Chemistries   Recent Labs Lab 05/27/17 1733 05/28/17 0324  NA 135 133*  K 4.0 4.6  CL 107 109  CO2 19* 20*  GLUCOSE 34* 170*  BUN 43* 35*  CREATININE 1.43* 1.15  CALCIUM 9.2 8.3*  MG 1.7  --    ------------------------------------------------------------------------------------------------------------------  Cardiac Enzymes  Recent Labs Lab 05/27/17 1733  TROPONINI <0.03   ------------------------------------------------------------------------------------------------------------------  RADIOLOGY:  Ct Head Wo Contrast  Result Date: 05/27/2017 CLINICAL DATA:  Altered level of consciousness. Hypoglycemia. No reported injury. EXAM: CT HEAD WITHOUT CONTRAST TECHNIQUE: Contiguous axial images were obtained from the base of the skull through the vertex without intravenous contrast. COMPARISON:  11/07/2012 head CT. FINDINGS: Brain: No evidence of parenchymal hemorrhage or extra-axial fluid collection. No mass lesion, mass effect, or midline shift. No CT evidence of acute infarction. Cerebral volume is age appropriate. No ventriculomegaly. Vascular: No acute abnormality. Skull: No evidence of calvarial fracture. Sinuses/Orbits: No fluid levels. Mucoperiosteal thickening in the ethmoidal air cells and left maxillary sinus. Other: Tiny partial right mastoid effusion, unchanged. The left mastoid air cells are unopacified. Partially visualized surgical hardware from ACDF in the lower cervical spine on the lateral scout topogram. IMPRESSION: 1.  No evidence of acute intracranial abnormality. 2. Mild paranasal sinusitis, probably chronic. 3. Nonspecific chronic tiny partial right mastoid effusion. Electronically Signed   By: Ilona Sorrel M.D.   On:  05/27/2017 18:30     ASSESSMENT AND PLAN:    61 year old male with history of ulcerative colitis and diabetes who presented due to  altered mental status from hypoglycemia.  1. Acute metabolic encephalopathy in setting hypoglycemia: Mental status is improved.  2. Persistent hypoglycemia: This has improved with D5. This is been turned off. Continue every 1 hour fingersticks Patient will likely only need 1 diabetic medication at the time of discharge with close follow-up by PCP  2. Chronic kidney disease stage III: Creatinine remained stable  3. Essential hypertension: Continue lisinopril  4. Hypokalemia: Continue statin  5. OSA: Continue CPAP at night  6. GERD: Continue PPI   Management plans discussed with the patient and he is in agreement.  CODE STATUS: full  TOTAL TIME TAKING CARE OF THIS PATIENT: 30 minutes.     POSSIBLE D/C today, DEPENDING ON CLINICAL CONDITION.   Davine Sweney M.D on 05/28/2017 at 8:49 AM  Between 7am to 6pm - Pager - [phone] After 6pm go to [email or web address] - password [email or web address] [facility]  Office  [phone]  CC: Primary care physician; Clementeen Graham, MD  Note: This dictation was prepared with Dragon dictation along with smaller phrase technology. Any transcriptional errors that result from this process are unintentional.

## 2017-05-28 NOTE — Progress Notes (Signed)
Spoke with Rose from [employer] regarding incident called in the ED. Concern called for adverse reaction Glipizide. Information updated and case being closed out. [phone].

## 2017-05-28 NOTE — Care Management Obs Status (Signed)
[place] NOTIFICATION   Patient Details  Name: [name] MRN: [id number] Date of Birth: 1956-03-18   Medicare Observation Status Notification Given:  Yes    Marshell Garfinkel, RN 05/28/2017, 2:50 PM

## 2017-05-28 NOTE — Discharge Summary (Addendum)
[facility] NAME: [name]    MR#:  [id number]  DATE OF BIRTH:  Oct 26, 1955  DATE OF ADMISSION:  05/27/2017 ADMITTING PHYSICIAN: Harvie Bridge, DO  DATE OF DISCHARGE: 05/28/2017  PRIMARY CARE PHYSICIAN: Clementeen Graham, MD    ADMISSION DIAGNOSIS:  Hypoglycemia associated with type 2 diabetes mellitus ([place]) [E11.649]  DISCHARGE DIAGNOSIS:  Active Problems:   Hypoglycemia   SECONDARY DIAGNOSIS:   Past Medical History:  Diagnosis Date  . Diabetes mellitus without complication [facility])     HOSPITAL COURSE:  61 year old male with history of ulcerative colitis and diabetes who presented due to altered mental status from hypoglycemia.  1. Acute metabolic encephalopathy in setting hypoglycemia: Mental status is improved.  2. Persistent hypoglycemia: This has improved with D5. Patient will need close follow-up by PCP  2. Chronic kidney disease stage III: Creatinine remained stable  3. Essential hypertension: Continue lisinopril  4. Hypokalemia: Continue statin  5. OSA: Continue CPAP at night  6. GERD: Continue PPI   Please note that discharge medications were completed by Intensivist not by myself. This morning I asked patient to stop VICOTZA until he is seen by PCP and to monitor his blood sugars. MEdication reconcilation does not reflect this as Dr Mortimer Fries completed this. I tried calling patient but there was no answer.    DISCHARGE CONDITIONS AND DIET:   Stable diabetic diet  CONSULTS OBTAINED:    DRUG ALLERGIES:  No Known Allergies  DISCHARGE MEDICATIONS:   Current Discharge Medication List    CONTINUE these medications which have NOT CHANGED   Details  aspirin EC 81 MG tablet Take 81 mg by mouth daily.    atorvastatin (LIPITOR) 40 MG tablet Take 40 mg by mouth daily. Refills: 2    glipiZIDE (GLUCOTROL) 10 MG tablet Take 10 mg by mouth 2 (two) times daily. Refills: 2    lisinopril  (PRINIVIL,ZESTRIL) 5 MG tablet Take 5 mg by mouth daily. Refills: 0    metFORMIN (GLUCOPHAGE) 1000 MG tablet Take 1,000 mg by mouth 2 (two) times daily. Refills: 2    omeprazole (PRILOSEC) 40 MG capsule Take 40 mg by mouth 2 (two) times daily. Refills: 4    venlafaxine XR (EFFEXOR-XR) 150 MG 24 hr capsule Take 150 mg by mouth daily. Refills: 1    VICTOZA 18 MG/3ML SOPN Inject 1.8 mg into the skin daily. Refills: 1          Today   CHIEF COMPLAINT:  Doing well this am   VITAL SIGNS:  Blood pressure 96/88, pulse 93, temperature 97.8 F (36.6 C), temperature source Oral, resp. rate 16, height 5' 10"  (1.778 m), weight 85.3 kg (188 lb), SpO2 100 %.   REVIEW OF SYSTEMS:  Review of Systems  Constitutional: Negative.  Negative for chills, fever and malaise/fatigue.  HENT: Negative.  Negative for ear discharge, ear pain, hearing loss, nosebleeds and sore throat.   Eyes: Negative.  Negative for blurred vision and pain.  Respiratory: Negative.  Negative for cough, hemoptysis, shortness of breath and wheezing.   Cardiovascular: Negative.  Negative for chest pain, palpitations and leg swelling.  Gastrointestinal: Negative.  Negative for abdominal pain, blood in stool, diarrhea, nausea and vomiting.  Genitourinary: Negative.  Negative for dysuria.  Musculoskeletal: Negative.  Negative for back pain.  Skin: Negative.   Neurological: Negative for dizziness, tremors, speech change, focal weakness, seizures and headaches.  Endo/Heme/Allergies: Negative.  Does not bruise/bleed easily.  Psychiatric/Behavioral: Negative.  Negative  for depression, hallucinations and suicidal ideas.     PHYSICAL EXAMINATION:  GENERAL:  61 y.o.-year-old patient lying in the bed with no acute distress.  NECK:  Supple, no jugular venous distention. No thyroid enlargement, no tenderness.  LUNGS: Normal breath sounds bilaterally, no wheezing, rales,rhonchi  No use of accessory muscles of respiration.   CARDIOVASCULAR: S1, S2 normal. No murmurs, rubs, or gallops.  ABDOMEN: Soft, non-tender, non-distended. Bowel sounds present. No organomegaly or mass.  EXTREMITIES: No pedal edema, cyanosis, or clubbing.  PSYCHIATRIC: The patient is alert and oriented x 3.  SKIN: No obvious rash, lesion, or ulcer.   DATA REVIEW:   CBC  Recent Labs Lab 05/28/17 0324  WBC 5.2  HGB 10.8*  HCT 30.6*  PLT 168    Chemistries   Recent Labs Lab 05/27/17 1733 05/28/17 0324  NA 135 133*  K 4.0 4.6  CL 107 109  CO2 19* 20*  GLUCOSE 34* 170*  BUN 43* 35*  CREATININE 1.43* 1.15  CALCIUM 9.2 8.3*  MG 1.7  --     Cardiac Enzymes  Recent Labs Lab 05/27/17 1733  TROPONINI <0.03    Microbiology Results  @MICRORSLT48 @  RADIOLOGY:  Ct Head Wo Contrast  Result Date: 05/27/2017 CLINICAL DATA:  Altered level of consciousness. Hypoglycemia. No reported injury. EXAM: CT HEAD WITHOUT CONTRAST TECHNIQUE: Contiguous axial images were obtained from the base of the skull through the vertex without intravenous contrast. COMPARISON:  11/07/2012 head CT. FINDINGS: Brain: No evidence of parenchymal hemorrhage or extra-axial fluid collection. No mass lesion, mass effect, or midline shift. No CT evidence of acute infarction. Cerebral volume is age appropriate. No ventriculomegaly. Vascular: No acute abnormality. Skull: No evidence of calvarial fracture. Sinuses/Orbits: No fluid levels. Mucoperiosteal thickening in the ethmoidal air cells and left maxillary sinus. Other: Tiny partial right mastoid effusion, unchanged. The left mastoid air cells are unopacified. Partially visualized surgical hardware from ACDF in the lower cervical spine on the lateral scout topogram. IMPRESSION: 1.  No evidence of acute intracranial abnormality. 2. Mild paranasal sinusitis, probably chronic. 3. Nonspecific chronic tiny partial right mastoid effusion. Electronically Signed   By: Ilona Sorrel M.D.   On: 05/27/2017 18:30      Current  Discharge Medication List    CONTINUE these medications which have NOT CHANGED   Details  aspirin EC 81 MG tablet Take 81 mg by mouth daily.    atorvastatin (LIPITOR) 40 MG tablet Take 40 mg by mouth daily. Refills: 2    glipiZIDE (GLUCOTROL) 10 MG tablet Take 10 mg by mouth 2 (two) times daily. Refills: 2    lisinopril (PRINIVIL,ZESTRIL) 5 MG tablet Take 5 mg by mouth daily. Refills: 0    metFORMIN (GLUCOPHAGE) 1000 MG tablet Take 1,000 mg by mouth 2 (two) times daily. Refills: 2    omeprazole (PRILOSEC) 40 MG capsule Take 40 mg by mouth 2 (two) times daily. Refills: 4    venlafaxine XR (EFFEXOR-XR) 150 MG 24 hr capsule Take 150 mg by mouth daily. Refills: 1    VICTOZA 18 MG/3ML SOPN Inject 1.8 mg into the skin daily. Refills: 1           Management plans discussed with the patient and he is in agreement. Stable for discharge home  Patient should follow up with pcp  CODE STATUS:     Code Status Orders        Start     Ordered   05/27/17 2303  Full code  Continuous  05/27/17 2302    Code Status History    Date Active Date Inactive Code Status Order ID Comments User Context   This patient has a current code status but no historical code status.      TOTAL TIME TAKING CARE OF THIS PATIENT: 38 minutes.        Note: This dictation was prepared with Dragon dictation along with smaller phrase technology. Any transcriptional errors that result from this process are unintentional.  Maxtyn Nuzum M.D on 05/28/2017 at 4:08 PM  Between 7am to 6pm - Pager - [phone] After 6pm go to [email or web address] - password [email or web address] [facility]  Office  [phone]  CC: Primary care physician; Clementeen Graham, MD

## 2017-05-28 NOTE — Discharge Instructions (Signed)
Patient advised to eat meals along with taking medications

## 2017-05-28 NOTE — Progress Notes (Signed)
Discharge Instructions reviewed with patient and spouse with patient's permission. Patient verbalized understanding. Patient made aware to follow up with Primary Care physician regarding this hospitalization. No medications prescribed to patient. IV dc'ed times two, cath intact. Patient denied any pain and stated that he felt fine prior to discharge. Patient left the unit via wheelchair, escorted by NT- reminded to take all belongings including cell phone.

## 2017-05-29 LAB — HIV ANTIBODY (ROUTINE TESTING W REFLEX): HIV SCREEN 4TH GENERATION: NONREACTIVE

## 2017-07-09 MED ORDER — OMEPRAZOLE 40 MG CAPSULE,DELAYED RELEASE
ORAL_CAPSULE | 5 refills | 0 days | Status: CP
Start: 2017-07-09 — End: 2018-02-21

## 2017-08-13 MED ORDER — GLIPIZIDE 10 MG TABLET
ORAL_TABLET | 1 refills | 0 days | Status: CP
Start: 2017-08-13 — End: 2017-11-25

## 2017-08-13 MED ORDER — METFORMIN 1,000 MG TABLET
ORAL_TABLET | 1 refills | 0 days | Status: CP
Start: 2017-08-13 — End: 2018-05-12

## 2017-08-13 MED ORDER — ATORVASTATIN 40 MG TABLET
ORAL_TABLET | 1 refills | 0 days | Status: CP
Start: 2017-08-13 — End: 2017-12-25

## 2017-10-04 MED ORDER — SILDENAFIL (PULMONARY HYPERTENSION) 20 MG TABLET
ORAL_TABLET | 5 refills | 0 days | Status: CP
Start: 2017-10-04 — End: ?

## 2017-11-25 ENCOUNTER — Encounter: Admit: 2017-11-25 | Discharge: 2017-11-26 | Payer: MEDICARE

## 2017-11-25 DIAGNOSIS — Z1159 Encounter for screening for other viral diseases: Secondary | ICD-10-CM

## 2017-11-25 DIAGNOSIS — F410 Panic disorder [episodic paroxysmal anxiety] without agoraphobia: Secondary | ICD-10-CM

## 2017-11-25 DIAGNOSIS — I10 Essential (primary) hypertension: Secondary | ICD-10-CM

## 2017-11-25 DIAGNOSIS — E1165 Type 2 diabetes mellitus with hyperglycemia: Principal | ICD-10-CM

## 2017-11-25 MED ORDER — DIAZEPAM 5 MG TABLET
ORAL_TABLET | Freq: Every day | ORAL | 0 refills | 0.00000 days | Status: CP | PRN
Start: 2017-11-25 — End: 2018-11-26

## 2017-11-25 MED ORDER — DIAZEPAM 5 MG TABLET: 5 mg | tablet | Freq: Every day | 0 refills | 0 days | Status: AC

## 2017-11-25 MED ORDER — INSULIN GLARGINE (U-100) 100 UNIT/ML (3 ML) SUBCUTANEOUS PEN
Freq: Every evening | SUBCUTANEOUS | 3 refills | 0 days | Status: CP
Start: 2017-11-25 — End: 2017-12-19

## 2017-12-19 ENCOUNTER — Ambulatory Visit: Admit: 2017-12-19 | Discharge: 2017-12-20 | Payer: MEDICARE

## 2017-12-19 DIAGNOSIS — B351 Tinea unguium: Secondary | ICD-10-CM

## 2017-12-19 DIAGNOSIS — M25512 Pain in left shoulder: Secondary | ICD-10-CM

## 2017-12-19 DIAGNOSIS — I10 Essential (primary) hypertension: Principal | ICD-10-CM

## 2017-12-19 DIAGNOSIS — E1165 Type 2 diabetes mellitus with hyperglycemia: Secondary | ICD-10-CM

## 2017-12-19 MED ORDER — INSULIN GLARGINE (U-100) 100 UNIT/ML (3 ML) SUBCUTANEOUS PEN
Freq: Every evening | SUBCUTANEOUS | 3 refills | 0.00000 days | Status: CP
Start: 2017-12-19 — End: 2017-12-23

## 2017-12-20 ENCOUNTER — Encounter: Admit: 2017-12-20 | Discharge: 2017-12-21 | Payer: MEDICARE

## 2017-12-20 DIAGNOSIS — H35372 Puckering of macula, left eye: Secondary | ICD-10-CM

## 2017-12-20 DIAGNOSIS — Z794 Long term (current) use of insulin: Secondary | ICD-10-CM

## 2017-12-20 DIAGNOSIS — H43812 Vitreous degeneration, left eye: Secondary | ICD-10-CM

## 2017-12-20 DIAGNOSIS — H2513 Age-related nuclear cataract, bilateral: Secondary | ICD-10-CM

## 2017-12-20 DIAGNOSIS — E1165 Type 2 diabetes mellitus with hyperglycemia: Principal | ICD-10-CM

## 2017-12-20 DIAGNOSIS — Z8669 Personal history of other diseases of the nervous system and sense organs: Secondary | ICD-10-CM

## 2017-12-20 DIAGNOSIS — Z0389 Encounter for observation for other suspected diseases and conditions ruled out: Secondary | ICD-10-CM

## 2017-12-20 DIAGNOSIS — E119 Type 2 diabetes mellitus without complications: Secondary | ICD-10-CM

## 2017-12-23 ENCOUNTER — Encounter: Admit: 2017-12-23 | Discharge: 2017-12-24 | Payer: MEDICARE | Attending: Internal Medicine | Primary: Internal Medicine

## 2017-12-23 DIAGNOSIS — E1165 Type 2 diabetes mellitus with hyperglycemia: Principal | ICD-10-CM

## 2017-12-23 MED ORDER — INSULIN GLARGINE (U-100) 100 UNIT/ML (3 ML) SUBCUTANEOUS PEN
Freq: Every evening | SUBCUTANEOUS | 3 refills | 0.00000 days | Status: CP
Start: 2017-12-23 — End: 2018-05-27

## 2017-12-25 MED ORDER — LISINOPRIL 5 MG TABLET
ORAL_TABLET | Freq: Every day | ORAL | 1 refills | 0 days | Status: CP
Start: 2017-12-25 — End: 2018-05-23

## 2017-12-25 MED ORDER — VENLAFAXINE ER 150 MG CAPSULE,EXTENDED RELEASE 24 HR
ORAL_CAPSULE | 2 refills | 0 days | Status: CP
Start: 2017-12-25 — End: 2018-09-22

## 2017-12-25 MED ORDER — ATORVASTATIN 40 MG TABLET
ORAL_TABLET | Freq: Every day | ORAL | 1 refills | 0 days | Status: CP
Start: 2017-12-25 — End: 2018-05-23

## 2018-01-31 ENCOUNTER — Encounter: Admit: 2018-01-31 | Discharge: 2018-01-31 | Payer: MEDICARE

## 2018-01-31 ENCOUNTER — Encounter: Admit: 2018-01-31 | Discharge: 2018-01-31 | Payer: MEDICARE | Attending: Family Medicine | Primary: Family Medicine

## 2018-01-31 DIAGNOSIS — M25512 Pain in left shoulder: Principal | ICD-10-CM

## 2018-01-31 DIAGNOSIS — M12812 Other specific arthropathies, not elsewhere classified, left shoulder: Secondary | ICD-10-CM

## 2018-02-24 MED ORDER — OMEPRAZOLE 40 MG CAPSULE,DELAYED RELEASE
ORAL_CAPSULE | 5 refills | 0 days | Status: CP
Start: 2018-02-24 — End: 2018-05-26

## 2018-05-13 MED ORDER — METFORMIN 1,000 MG TABLET
ORAL_TABLET | Freq: Two times a day (BID) | ORAL | 1 refills | 0 days | Status: CP
Start: 2018-05-13 — End: 2019-01-16

## 2018-05-23 MED ORDER — ATORVASTATIN 40 MG TABLET
ORAL_TABLET | Freq: Every day | ORAL | 1 refills | 0 days | Status: CP
Start: 2018-05-23 — End: 2018-11-26

## 2018-05-23 MED ORDER — LISINOPRIL 5 MG TABLET
ORAL_TABLET | Freq: Every day | ORAL | 1 refills | 0 days | Status: CP
Start: 2018-05-23 — End: 2019-05-23

## 2018-05-27 ENCOUNTER — Ambulatory Visit: Admit: 2018-05-27 | Discharge: 2018-05-28 | Payer: MEDICARE

## 2018-05-27 ENCOUNTER — Encounter
Admit: 2018-05-27 | Discharge: 2018-05-28 | Payer: MEDICARE | Attending: Student in an Organized Health Care Education/Training Program | Primary: Student in an Organized Health Care Education/Training Program

## 2018-05-27 DIAGNOSIS — M25561 Pain in right knee: Secondary | ICD-10-CM | POA: Insufficient documentation

## 2018-05-27 DIAGNOSIS — E1165 Type 2 diabetes mellitus with hyperglycemia: Principal | ICD-10-CM

## 2018-05-27 MED ORDER — OMEPRAZOLE 40 MG CAPSULE,DELAYED RELEASE
ORAL_CAPSULE | Freq: Two times a day (BID) | ORAL | 1 refills | 0 days | Status: CP
Start: 2018-05-27 — End: ?

## 2018-05-27 MED ORDER — INSULIN GLARGINE (U-100) 100 UNIT/ML (3 ML) SUBCUTANEOUS PEN
Freq: Every evening | SUBCUTANEOUS | 3 refills | 0 days | Status: CP
Start: 2018-05-27 — End: 2018-09-15

## 2018-06-06 ENCOUNTER — Encounter: Admit: 2018-06-06 | Discharge: 2018-06-07 | Payer: MEDICARE

## 2018-06-06 DIAGNOSIS — M1711 Unilateral primary osteoarthritis, right knee: Principal | ICD-10-CM

## 2018-06-06 MED ORDER — NAPROXEN 500 MG TABLET
ORAL_TABLET | Freq: Two times a day (BID) | ORAL | 11 refills | 0 days | Status: CP
Start: 2018-06-06 — End: 2018-11-27

## 2018-06-20 ENCOUNTER — Encounter
Admit: 2018-06-20 | Discharge: 2018-06-21 | Payer: MEDICARE | Attending: Student in an Organized Health Care Education/Training Program | Primary: Student in an Organized Health Care Education/Training Program

## 2018-06-20 DIAGNOSIS — M25561 Pain in right knee: Principal | ICD-10-CM

## 2018-06-20 DIAGNOSIS — E1169 Type 2 diabetes mellitus with other specified complication: Secondary | ICD-10-CM

## 2018-09-15 ENCOUNTER — Encounter
Admit: 2018-09-15 | Discharge: 2018-09-16 | Payer: MEDICARE | Attending: Adult Reconstructive Orthopaedic Surgery | Primary: Adult Reconstructive Orthopaedic Surgery

## 2018-09-15 DIAGNOSIS — M25561 Pain in right knee: Principal | ICD-10-CM

## 2018-09-22 MED ORDER — VENLAFAXINE ER 150 MG CAPSULE,EXTENDED RELEASE 24 HR
ORAL_CAPSULE | Freq: Every day | ORAL | 2 refills | 0 days | Status: CP
Start: 2018-09-22 — End: ?

## 2018-10-07 ENCOUNTER — Encounter
Admit: 2018-10-07 | Discharge: 2018-10-08 | Payer: MEDICARE | Attending: Student in an Organized Health Care Education/Training Program | Primary: Student in an Organized Health Care Education/Training Program

## 2018-10-07 DIAGNOSIS — F4310 Post-traumatic stress disorder, unspecified: Secondary | ICD-10-CM | POA: Diagnosis present

## 2018-10-07 DIAGNOSIS — L0291 Cutaneous abscess, unspecified: Principal | ICD-10-CM

## 2018-10-07 DIAGNOSIS — F410 Panic disorder [episodic paroxysmal anxiety] without agoraphobia: Secondary | ICD-10-CM

## 2018-10-07 DIAGNOSIS — S069X0S Unspecified intracranial injury without loss of consciousness, sequela: Secondary | ICD-10-CM

## 2018-10-07 DIAGNOSIS — K51919 Ulcerative colitis, unspecified with unspecified complications: Secondary | ICD-10-CM

## 2018-10-07 DIAGNOSIS — K91850 Pouchitis: Secondary | ICD-10-CM

## 2018-10-07 DIAGNOSIS — E1165 Type 2 diabetes mellitus with hyperglycemia: Secondary | ICD-10-CM

## 2018-10-07 MED ORDER — DOXYCYCLINE HYCLATE 100 MG TABLET
ORAL_TABLET | Freq: Two times a day (BID) | ORAL | 0 refills | 0 days | Status: CP
Start: 2018-10-07 — End: 2018-10-12

## 2018-10-07 MED ORDER — DIPHENOXYLATE-ATROPINE 2.5 MG-0.025 MG TABLET
ORAL_TABLET | Freq: Four times a day (QID) | ORAL | 1 refills | 0.00000 days | Status: CP | PRN
Start: 2018-10-07 — End: 2018-11-26

## 2018-10-07 MED ORDER — HYDROXYZINE HCL 25 MG TABLET
Freq: Three times a day (TID) | ORAL | 0 refills | 0.00000 days | Status: CP
Start: 2018-10-07 — End: 2018-11-26

## 2018-10-07 MED ORDER — GLIPIZIDE 10 MG TABLET
ORAL_TABLET | Freq: Two times a day (BID) | ORAL | 5 refills | 0 days | Status: CP
Start: 2018-10-07 — End: 2019-10-07

## 2018-11-26 ENCOUNTER — Encounter
Admit: 2018-11-26 | Discharge: 2018-11-27 | Payer: MEDICARE | Attending: Student in an Organized Health Care Education/Training Program | Primary: Student in an Organized Health Care Education/Training Program

## 2018-11-26 DIAGNOSIS — C189 Malignant neoplasm of colon, unspecified: Principal | ICD-10-CM

## 2018-11-26 DIAGNOSIS — I10 Essential (primary) hypertension: Principal | ICD-10-CM

## 2018-11-26 DIAGNOSIS — F4310 Post-traumatic stress disorder, unspecified: Principal | ICD-10-CM

## 2018-11-26 DIAGNOSIS — F419 Anxiety disorder, unspecified: Principal | ICD-10-CM

## 2018-11-26 DIAGNOSIS — M25552 Pain in left hip: Principal | ICD-10-CM

## 2018-11-26 DIAGNOSIS — F329 Major depressive disorder, single episode, unspecified: Principal | ICD-10-CM

## 2018-11-26 DIAGNOSIS — G4730 Sleep apnea, unspecified: Principal | ICD-10-CM

## 2018-11-26 DIAGNOSIS — Z794 Long term (current) use of insulin: Principal | ICD-10-CM

## 2018-11-26 DIAGNOSIS — F410 Panic disorder [episodic paroxysmal anxiety] without agoraphobia: Principal | ICD-10-CM

## 2018-11-26 DIAGNOSIS — K279 Peptic ulcer, site unspecified, unspecified as acute or chronic, without hemorrhage or perforation: Principal | ICD-10-CM

## 2018-11-26 DIAGNOSIS — K5190 Ulcerative colitis, unspecified, without complications: Principal | ICD-10-CM

## 2018-11-26 DIAGNOSIS — K219 Gastro-esophageal reflux disease without esophagitis: Principal | ICD-10-CM

## 2018-11-26 DIAGNOSIS — E1165 Type 2 diabetes mellitus with hyperglycemia: Principal | ICD-10-CM

## 2018-11-26 DIAGNOSIS — J020 Streptococcal pharyngitis: Principal | ICD-10-CM

## 2018-11-26 DIAGNOSIS — H269 Unspecified cataract: Principal | ICD-10-CM

## 2018-11-26 DIAGNOSIS — E119 Type 2 diabetes mellitus without complications: Principal | ICD-10-CM

## 2018-11-26 DIAGNOSIS — K91850 Pouchitis: Principal | ICD-10-CM

## 2018-11-26 MED ORDER — DIAZEPAM 5 MG TABLET
ORAL_TABLET | Freq: Every day | ORAL | 0 refills | 0.00000 days | Status: CP | PRN
Start: 2018-11-26 — End: 2018-11-26

## 2018-11-26 MED ORDER — DIPHENOXYLATE-ATROPINE 2.5 MG-0.025 MG TABLET
ORAL_TABLET | Freq: Four times a day (QID) | ORAL | 5 refills | 0 days | Status: CP | PRN
Start: 2018-11-26 — End: 2018-12-30

## 2018-11-26 MED ORDER — ATORVASTATIN 40 MG TABLET
ORAL_TABLET | Freq: Every day | ORAL | 1 refills | 0.00000 days | Status: CP
Start: 2018-11-26 — End: ?

## 2018-11-26 MED ORDER — DIAZEPAM 5 MG TABLET: 5 mg | tablet | Freq: Every day | 0 refills | 0 days | Status: AC

## 2018-12-30 ENCOUNTER — Encounter: Admit: 2018-12-30 | Discharge: 2018-12-31 | Payer: MEDICARE | Attending: Sports Medicine | Primary: Sports Medicine

## 2018-12-30 DIAGNOSIS — E1165 Type 2 diabetes mellitus with hyperglycemia: Principal | ICD-10-CM

## 2018-12-30 DIAGNOSIS — K91850 Pouchitis: Secondary | ICD-10-CM

## 2018-12-30 MED ORDER — DIPHENOXYLATE-ATROPINE 2.5 MG-0.025 MG TABLET
ORAL_TABLET | Freq: Four times a day (QID) | ORAL | 3 refills | 0 days | Status: CP
Start: 2018-12-30 — End: 2019-12-30

## 2018-12-30 MED ORDER — CIPROFLOXACIN 500 MG TABLET
ORAL_TABLET | Freq: Two times a day (BID) | ORAL | 0 refills | 0.00000 days | Status: CP
Start: 2018-12-30 — End: 2019-01-06

## 2018-12-30 MED ORDER — ONDANSETRON 4 MG DISINTEGRATING TABLET
ORAL_TABLET | Freq: Three times a day (TID) | ORAL | 0 refills | 0.00000 days | Status: CP | PRN
Start: 2018-12-30 — End: 2019-01-06

## 2019-01-16 MED ORDER — METFORMIN 1,000 MG TABLET
ORAL_TABLET | Freq: Two times a day (BID) | ORAL | 1 refills | 0.00000 days | Status: CP
Start: 2019-01-16 — End: ?

## 2019-05-29 ENCOUNTER — Encounter: Admit: 2019-05-29 | Discharge: 2019-05-29 | Disposition: A | Discharge status: Home / Self Care | Payer: MEDICARE

## 2019-05-29 ENCOUNTER — Ambulatory Visit: Admit: 2019-05-29 | Discharge: 2019-05-29 | Disposition: A | Discharge status: Home / Self Care | Payer: MEDICARE

## 2019-05-29 DIAGNOSIS — Z7984 Long term (current) use of oral hypoglycemic drugs: Secondary | ICD-10-CM

## 2019-05-29 DIAGNOSIS — F329 Major depressive disorder, single episode, unspecified: Secondary | ICD-10-CM

## 2019-05-29 DIAGNOSIS — M19012 Primary osteoarthritis, left shoulder: Secondary | ICD-10-CM

## 2019-05-29 DIAGNOSIS — F4310 Post-traumatic stress disorder, unspecified: Secondary | ICD-10-CM

## 2019-05-29 DIAGNOSIS — Z79899 Other long term (current) drug therapy: Secondary | ICD-10-CM

## 2019-05-29 DIAGNOSIS — I10 Essential (primary) hypertension: Secondary | ICD-10-CM

## 2019-05-29 DIAGNOSIS — F419 Anxiety disorder, unspecified: Secondary | ICD-10-CM

## 2019-05-29 DIAGNOSIS — M25512 Pain in left shoulder: Secondary | ICD-10-CM

## 2019-05-29 DIAGNOSIS — E119 Type 2 diabetes mellitus without complications: Secondary | ICD-10-CM

## 2019-05-29 DIAGNOSIS — K219 Gastro-esophageal reflux disease without esophagitis: Secondary | ICD-10-CM

## 2019-05-29 MED ORDER — CYCLOBENZAPRINE 10 MG TABLET
ORAL_TABLET | Freq: Two times a day (BID) | ORAL | 0 refills | 5 days | Status: CP | PRN
Start: 2019-05-29 — End: 2019-06-03

## 2019-06-19 ENCOUNTER — Encounter
Admit: 2019-06-19 | Discharge: 2019-06-20 | Payer: MEDICARE | Attending: Rehabilitative and Restorative Service Providers" | Primary: Rehabilitative and Restorative Service Providers"

## 2019-06-19 ENCOUNTER — Encounter: Admit: 2019-06-19 | Discharge: 2019-06-20 | Payer: MEDICARE

## 2019-06-19 DIAGNOSIS — M170 Bilateral primary osteoarthritis of knee: Secondary | ICD-10-CM

## 2019-06-19 DIAGNOSIS — M25569 Pain in unspecified knee: Secondary | ICD-10-CM

## 2019-06-23 ENCOUNTER — Encounter: Payer: Self-pay | Admitting: Family Medicine

## 2019-07-08 ENCOUNTER — Encounter: Payer: Self-pay | Admitting: *Deleted

## 2019-07-27 DIAGNOSIS — K0889 Other specified disorders of teeth and supporting structures: Principal | ICD-10-CM

## 2019-09-04 ENCOUNTER — Other Ambulatory Visit: Payer: Self-pay

## 2019-09-04 DIAGNOSIS — Z20822 Contact with and (suspected) exposure to covid-19: Secondary | ICD-10-CM

## 2019-09-06 LAB — NOVEL CORONAVIRUS, NAA: SARS-CoV-2, NAA: NOT DETECTED

## 2019-09-07 ENCOUNTER — Telehealth: Payer: Self-pay

## 2019-09-07 NOTE — Telephone Encounter (Signed)
Caller given negative result and verbalized understanding

## 2019-10-06 ENCOUNTER — Ambulatory Visit: Admit: 2019-10-06 | Discharge: 2019-10-07 | Payer: MEDICARE

## 2019-10-06 DIAGNOSIS — M1711 Unilateral primary osteoarthritis, right knee: Principal | ICD-10-CM

## 2019-12-04 ENCOUNTER — Inpatient Hospital Stay
Admission: EM | Admit: 2019-12-04 | Discharge: 2019-12-07 | DRG: 641 | Disposition: A | Discharge status: Home / Self Care | Payer: Medicare HMO | Attending: Family Medicine | Admitting: Family Medicine

## 2019-12-04 ENCOUNTER — Emergency Department: Payer: Medicare HMO

## 2019-12-04 ENCOUNTER — Other Ambulatory Visit: Payer: Self-pay

## 2019-12-04 DIAGNOSIS — Z794 Long term (current) use of insulin: Secondary | ICD-10-CM

## 2019-12-04 DIAGNOSIS — Z9049 Acquired absence of other specified parts of digestive tract: Secondary | ICD-10-CM

## 2019-12-04 DIAGNOSIS — Z882 Allergy status to sulfonamides status: Secondary | ICD-10-CM

## 2019-12-04 DIAGNOSIS — R410 Disorientation, unspecified: Secondary | ICD-10-CM

## 2019-12-04 DIAGNOSIS — S069X9A Unspecified intracranial injury with loss of consciousness of unspecified duration, initial encounter: Secondary | ICD-10-CM | POA: Diagnosis present

## 2019-12-04 DIAGNOSIS — Z980 Intestinal bypass and anastomosis status: Secondary | ICD-10-CM

## 2019-12-04 DIAGNOSIS — E860 Dehydration: Principal | ICD-10-CM | POA: Diagnosis present

## 2019-12-04 DIAGNOSIS — M542 Cervicalgia: Secondary | ICD-10-CM | POA: Diagnosis present

## 2019-12-04 DIAGNOSIS — R197 Diarrhea, unspecified: Secondary | ICD-10-CM | POA: Diagnosis not present

## 2019-12-04 DIAGNOSIS — I10 Essential (primary) hypertension: Secondary | ICD-10-CM | POA: Diagnosis not present

## 2019-12-04 DIAGNOSIS — E875 Hyperkalemia: Secondary | ICD-10-CM | POA: Diagnosis present

## 2019-12-04 DIAGNOSIS — R1110 Vomiting, unspecified: Secondary | ICD-10-CM | POA: Diagnosis present

## 2019-12-04 DIAGNOSIS — G4733 Obstructive sleep apnea (adult) (pediatric): Secondary | ICD-10-CM | POA: Diagnosis present

## 2019-12-04 DIAGNOSIS — Z7982 Long term (current) use of aspirin: Secondary | ICD-10-CM

## 2019-12-04 DIAGNOSIS — T380X5A Adverse effect of glucocorticoids and synthetic analogues, initial encounter: Secondary | ICD-10-CM | POA: Diagnosis not present

## 2019-12-04 DIAGNOSIS — B029 Zoster without complications: Secondary | ICD-10-CM | POA: Diagnosis present

## 2019-12-04 DIAGNOSIS — Z20822 Contact with and (suspected) exposure to covid-19: Secondary | ICD-10-CM | POA: Diagnosis present

## 2019-12-04 DIAGNOSIS — R4182 Altered mental status, unspecified: Secondary | ICD-10-CM | POA: Diagnosis present

## 2019-12-04 DIAGNOSIS — F4310 Post-traumatic stress disorder, unspecified: Secondary | ICD-10-CM | POA: Diagnosis present

## 2019-12-04 DIAGNOSIS — S069XAA Unspecified intracranial injury with loss of consciousness status unknown, initial encounter: Secondary | ICD-10-CM | POA: Diagnosis present

## 2019-12-04 DIAGNOSIS — R7989 Other specified abnormal findings of blood chemistry: Secondary | ICD-10-CM | POA: Diagnosis present

## 2019-12-04 DIAGNOSIS — F410 Panic disorder [episodic paroxysmal anxiety] without agoraphobia: Secondary | ICD-10-CM | POA: Diagnosis present

## 2019-12-04 DIAGNOSIS — E1165 Type 2 diabetes mellitus with hyperglycemia: Secondary | ICD-10-CM | POA: Diagnosis not present

## 2019-12-04 DIAGNOSIS — N179 Acute kidney failure, unspecified: Secondary | ICD-10-CM | POA: Diagnosis present

## 2019-12-04 DIAGNOSIS — B027 Disseminated zoster: Secondary | ICD-10-CM | POA: Diagnosis present

## 2019-12-04 DIAGNOSIS — E871 Hypo-osmolality and hyponatremia: Secondary | ICD-10-CM | POA: Diagnosis present

## 2019-12-04 DIAGNOSIS — IMO0002 Reserved for concepts with insufficient information to code with codable children: Secondary | ICD-10-CM | POA: Diagnosis present

## 2019-12-04 DIAGNOSIS — E785 Hyperlipidemia, unspecified: Secondary | ICD-10-CM | POA: Diagnosis present

## 2019-12-04 DIAGNOSIS — Z85038 Personal history of other malignant neoplasm of large intestine: Secondary | ICD-10-CM

## 2019-12-04 DIAGNOSIS — R739 Hyperglycemia, unspecified: Secondary | ICD-10-CM

## 2019-12-04 DIAGNOSIS — Z79899 Other long term (current) drug therapy: Secondary | ICD-10-CM

## 2019-12-04 DIAGNOSIS — W19XXXA Unspecified fall, initial encounter: Secondary | ICD-10-CM | POA: Diagnosis present

## 2019-12-04 DIAGNOSIS — R519 Headache, unspecified: Secondary | ICD-10-CM | POA: Diagnosis present

## 2019-12-04 DIAGNOSIS — W2209XA Striking against other stationary object, initial encounter: Secondary | ICD-10-CM | POA: Diagnosis present

## 2019-12-04 LAB — GLUCOSE, CAPILLARY
Glucose-Capillary: 368 mg/dL — ABNORMAL HIGH (ref 70–99)
Glucose-Capillary: 323 mg/dL — ABNORMAL HIGH (ref 70–99)

## 2019-12-04 LAB — CBC WITH DIFFERENTIAL/PLATELET
Abs Immature Granulocytes: 0.03 10*3/uL (ref 0.00–0.07)
Basophils Absolute: 0 10*3/uL (ref 0.0–0.1)
Basophils Relative: 0 %
Eosinophils Absolute: 0.1 10*3/uL (ref 0.0–0.5)
Eosinophils Relative: 1 %
HCT: 38.8 % — ABNORMAL LOW (ref 39.0–52.0)
Hemoglobin: 14.2 g/dL (ref 13.0–17.0)
Immature Granulocytes: 0 %
Lymphocytes Relative: 10 %
Lymphs Abs: 0.7 10*3/uL (ref 0.7–4.0)
MCH: 29.5 pg (ref 26.0–34.0)
MCHC: 36.6 g/dL — ABNORMAL HIGH (ref 30.0–36.0)
MCV: 80.5 fL (ref 80.0–100.0)
Monocytes Absolute: 0.4 10*3/uL (ref 0.1–1.0)
Monocytes Relative: 6 %
Neutro Abs: 5.9 10*3/uL (ref 1.7–7.7)
Neutrophils Relative %: 83 %
Platelets: 142 10*3/uL — ABNORMAL LOW (ref 150–400)
RBC: 4.82 MIL/uL (ref 4.22–5.81)
RDW: 11.9 % (ref 11.5–15.5)
WBC: 7.1 10*3/uL (ref 4.0–10.5)
nRBC: 0 % (ref 0.0–0.2)

## 2019-12-04 LAB — BASIC METABOLIC PANEL
Anion gap: 12 (ref 5–15)
BUN: 38 mg/dL — ABNORMAL HIGH (ref 8–23)
CO2: 15 mmol/L — ABNORMAL LOW (ref 22–32)
Calcium: 8.5 mg/dL — ABNORMAL LOW (ref 8.9–10.3)
Chloride: 94 mmol/L — ABNORMAL LOW (ref 98–111)
Creatinine, Ser: 2.14 mg/dL — ABNORMAL HIGH (ref 0.61–1.24)
GFR calc Af Amer: 37 mL/min — ABNORMAL LOW (ref >60)
GFR calc non Af Amer: 32 mL/min — ABNORMAL LOW (ref >60)
Glucose, Bld: 457 mg/dL — ABNORMAL HIGH (ref 70–99)
Potassium: 5.2 mmol/L — ABNORMAL HIGH (ref 3.5–5.1)
Sodium: 121 mmol/L — ABNORMAL LOW (ref 135–145)

## 2019-12-04 LAB — POC SARS CORONAVIRUS 2 AG: SARS Coronavirus 2 Ag: NEGATIVE

## 2019-12-04 LAB — HEPATIC FUNCTION PANEL
ALT: 30 U/L (ref 0–44)
AST: 29 U/L (ref 15–41)
Albumin: 4 g/dL (ref 3.5–5.0)
Alkaline Phosphatase: 222 U/L — ABNORMAL HIGH (ref 38–126)
Bilirubin, Direct: 0.4 mg/dL — ABNORMAL HIGH (ref 0.0–0.2)
Indirect Bilirubin: 0.9 mg/dL (ref 0.3–0.9)
Total Bilirubin: 1.3 mg/dL — ABNORMAL HIGH (ref 0.3–1.2)
Total Protein: 7.1 g/dL (ref 6.5–8.1)

## 2019-12-04 MED ORDER — OXYCODONE HCL 5 MG PO TABS
5.0000 mg | ORAL_TABLET | ORAL | Status: DC | PRN
  Administered 2019-12-04 – 2019-12-07 (×10): 5 mg via ORAL
  Filled 2019-12-04 (×10): qty 1

## 2019-12-04 MED ORDER — TRAMADOL HCL 50 MG PO TABS
50.0000 mg | ORAL_TABLET | Freq: Once | ORAL | Status: AC
  Administered 2019-12-04: 50 mg via ORAL
  Filled 2019-12-04: qty 1

## 2019-12-04 MED ORDER — SODIUM CHLORIDE 0.9 % IV SOLN: INTRAVENOUS | Status: DC

## 2019-12-04 MED ORDER — INSULIN GLARGINE 100 UNIT/ML ~~LOC~~ SOLN: 35.0000 [IU] | Freq: Every day | SUBCUTANEOUS | Status: DC

## 2019-12-04 MED ORDER — INSULIN GLARGINE 100 UNIT/ML ~~LOC~~ SOLN
20.0000 [IU] | Freq: Every day | SUBCUTANEOUS | Status: DC
  Filled 2019-12-04 (×2): qty 0.2

## 2019-12-04 MED ORDER — INSULIN ASPART 100 UNIT/ML ~~LOC~~ SOLN
4.0000 [IU] | Freq: Three times a day (TID) | SUBCUTANEOUS | Status: DC
  Administered 2019-12-05 – 2019-12-06 (×6): 4 [IU] via SUBCUTANEOUS
  Filled 2019-12-04 (×6): qty 1

## 2019-12-04 MED ORDER — SODIUM CHLORIDE 0.9 % IV BOLUS
1000.0000 mL | Freq: Once | INTRAVENOUS | Status: AC
  Administered 2019-12-04: 1000 mL via INTRAVENOUS

## 2019-12-04 MED ORDER — INSULIN ASPART 100 UNIT/ML ~~LOC~~ SOLN
0.0000 [IU] | Freq: Three times a day (TID) | SUBCUTANEOUS | Status: DC
  Administered 2019-12-05 (×3): 3 [IU] via SUBCUTANEOUS
  Administered 2019-12-06: 11 [IU] via SUBCUTANEOUS
  Administered 2019-12-06: 3 [IU] via SUBCUTANEOUS
  Filled 2019-12-04 (×4): qty 1

## 2019-12-04 MED ORDER — ACYCLOVIR 200 MG PO CAPS
800.0000 mg | ORAL_CAPSULE | Freq: Every day | ORAL | Status: DC
  Administered 2019-12-04 – 2019-12-05 (×3): 800 mg via ORAL
  Filled 2019-12-04 (×5): qty 4

## 2019-12-04 MED ORDER — INSULIN GLARGINE 100 UNIT/ML ~~LOC~~ SOLN
38.0000 [IU] | Freq: Once | SUBCUTANEOUS | Status: AC
  Administered 2019-12-04: 38 [IU] via SUBCUTANEOUS
  Filled 2019-12-04: qty 0.38

## 2019-12-04 MED ORDER — ENOXAPARIN SODIUM 40 MG/0.4ML ~~LOC~~ SOLN: 40.0000 mg | SUBCUTANEOUS | Status: DC

## 2019-12-04 MED ORDER — INSULIN ASPART 100 UNIT/ML ~~LOC~~ SOLN
0.0000 [IU] | Freq: Every day | SUBCUTANEOUS | Status: DC
  Administered 2019-12-04: 4 [IU] via SUBCUTANEOUS
  Administered 2019-12-05: 3 [IU] via SUBCUTANEOUS
  Filled 2019-12-04 (×2): qty 1

## 2019-12-04 NOTE — ED Notes (Signed)
Pt alert and orientedx4. Answering questions appropriately

## 2019-12-04 NOTE — ED Notes (Signed)
CBG 323

## 2019-12-04 NOTE — ED Notes (Signed)
Attempted to call report to floor, the floor states they are unable to take pt at the moment d/t pt being treated for shingles and airborne room not available at the moment

## 2019-12-04 NOTE — ED Notes (Signed)
Pt given ice water, lights dimmed

## 2019-12-04 NOTE — ED Notes (Signed)
Pt eating dinner tray at this time

## 2019-12-04 NOTE — ED Triage Notes (Signed)
Pt arrive via w/c from [facility], pt passed out after bending over Monday hitting his head on the wooden door frame. The metal on the door also hit the right side of his neck, pt states now he has a rash and pain to the right side of his neck and his right side. Denies hx of shingles. Pt states that he cont to have frontal head pressure, states that his thinking is slower than usual and slower with his speech. Pt drove himself to [facility], where they attempted to contact his wife and pt is somewhat confused with the phone number

## 2019-12-04 NOTE — ED Provider Notes (Signed)
[facility] Emergency Department Provider Note ____________________________________________   First MD Initiated Contact with Patient 12/04/19 1541     (approximate)  I have reviewed the triage vital signs and the nursing notes.   HISTORY  Chief Complaint Head Injury and Altered Mental Status  HPI [name] is a 64 y.o. male with a history of diabetes presenting to the emergency department from critical urgent care due to confusion, slow speech, and a rash on the right neck, right shoulder, and chest.  He reports that symptoms have been gradually worsening "for years" but seem to have been acutely worse over the past 3 days.  He reports that he bent over Monday and hit his head on a wooden door frame and had a brief syncopal episode.  He also reports that he has had several episodes of vomiting and diarrhea over the past 24 hours.  He did take a Zofran this afternoon and has not vomited since taking it.    Past Medical History:  Diagnosis Date  . Diabetes mellitus without complication [facility])     Patient Active Problem List   Diagnosis Date Noted  . Hypoglycemia 05/27/2017    No past surgical history on file.  Prior to Admission medications   Medication Sig Start Date End Date Taking? Authorizing Provider  aspirin EC 81 MG tablet Take 81 mg by mouth daily. 08/02/15 04/10/29  [provider]  atorvastatin (LIPITOR) 40 MG tablet Take 40 mg by mouth daily. 03/14/17   [provider]  glipiZIDE (GLUCOTROL) 10 MG tablet Take 10 mg by mouth 2 (two) times daily. 03/10/17   [provider]  lisinopril (PRINIVIL,ZESTRIL) 5 MG tablet Take 5 mg by mouth daily. 05/23/17   [provider]  metFORMIN (GLUCOPHAGE) 1000 MG tablet Take 1,000 mg by mouth 2 (two) times daily. 04/08/17   [provider]  omeprazole (PRILOSEC) 40 MG capsule Take 40 mg by mouth 2 (two) times daily. 05/23/17   [provider]  venlafaxine XR  (EFFEXOR-XR) 150 MG 24 hr capsule Take 150 mg by mouth daily. 03/20/17   [provider]  VICTOZA 18 MG/3ML SOPN Inject 1.8 mg into the skin daily. 05/07/17   [provider]    Allergies Sulfa antibiotics  No family history on file.  Social History Social History   Tobacco Use  . Smoking status: Not on file  Substance Use Topics  . Alcohol use: Not on file  . Drug use: Not on file    Review of Systems  Constitutional: No fever/chills Eyes: No visual changes. ENT: No sore throat. Cardiovascular: Denies chest pain. Respiratory: Denies shortness of breath. Gastrointestinal: No abdominal pain.  Positive for nausea, vomiting, diarrhea. Genitourinary: Negative for dysuria. Musculoskeletal: Negative for back pain. Skin: Negative for rash. Neurological: Negative for headaches, focal weakness or numbness. ____________________________________________   PHYSICAL EXAM:  VITAL SIGNS: ED Triage Vitals  Enc Vitals Group     BP 12/04/19 1403 104/64     Pulse Rate 12/04/19 1403 (!) 107     Resp 12/04/19 1403 18     Temp 12/04/19 1403 97.8 F (36.6 C)     Temp Source 12/04/19 1403 Oral     SpO2 12/04/19 1403 99 %     Weight 12/04/19 1404 198 lb (89.8 kg)     Height 12/04/19 1404 5' 10"  (1.778 m)     Head Circumference --      Peak Flow --      Pain  Score 12/04/19 1404 8     Pain Loc --      Pain Edu? --      Excl. in [place]? --     Constitutional: Alert and oriented. Well appearing and in no acute distress. Eyes: Conjunctivae are normal. PERRL. EOMI. Head: Atraumatic. Nose: No congestion/rhinnorhea. Mouth/Throat: Mucous membranes are moist.  Oropharynx non-erythematous. Neck: No stridor.   Hematological/Lymphatic/Immunilogical: No cervical lymphadenopathy. Cardiovascular: Normal rate, regular rhythm. Grossly normal heart sounds.  Good peripheral circulation. Respiratory: Normal respiratory effort.  No retractions. Lungs CTAB. Gastrointestinal: Soft and  nontender. No distention. No abdominal bruits. Musculoskeletal: No lower extremity tenderness nor edema.  No joint effusions. Neurologic:  Normal speech and language. No gross focal neurologic deficits are appreciated. No gait instability.  Finger-to-nose testing is normal, heel-to-shin testing is normal, no pronator drift, tongue protrudes midline, equal smile, no nystagmus. Skin: Vesicular rash on the right side neck that continues to the anterior shoulder and chest wall.  Psychiatric: Mood and affect are normal. Speech and behavior are normal.  ____________________________________________   LABS (all labs ordered are listed, but only abnormal results are displayed)  Labs Reviewed  BASIC METABOLIC PANEL - Abnormal; Notable for the following components:      Result Value   Sodium 121 (*)    Potassium 5.2 (*)    Chloride 94 (*)    CO2 15 (*)    Glucose, Bld 457 (*)    BUN 38 (*)    Creatinine, Ser 2.14 (*)    Calcium 8.5 (*)    GFR calc non Af Amer 32 (*)    GFR calc Af Amer 37 (*)    All other components within normal limits  CBC WITH DIFFERENTIAL/PLATELET - Abnormal; Notable for the following components:   HCT 38.8 (*)    MCHC 36.6 (*)    Platelets 142 (*)    All other components within normal limits  HEPATIC FUNCTION PANEL - Abnormal; Notable for the following components:   Alkaline Phosphatase 222 (*)    Total Bilirubin 1.3 (*)    Bilirubin, Direct 0.4 (*)    All other components within normal limits  POC SARS CORONAVIRUS 2 AG -  ED  POC SARS CORONAVIRUS 2 AG  CBG MONITORING, ED   ____________________________________________  EKG  ED ECG REPORT I, Jassica Zazueta, FNP-BC personally viewed and interpreted this ECG.   Date: 12/04/2019  Rate: 106  Rhythm: sinus tachycardia  Axis: normal  Intervals:none  ST&T Change: no ST elevation  ____________________________________________  RADIOLOGY  ED MD interpretation:    CT of the head and cervical spine are negative  for acute findings per radiology.  Official radiology report(s): CT HEAD WO CONTRAST  Result Date: 12/04/2019 CLINICAL DATA:  64 year old male with continued headache and RIGHT neck pain following fall 4 days ago. Initial encounter. EXAM: CT HEAD WITHOUT CONTRAST CT CERVICAL SPINE WITHOUT CONTRAST TECHNIQUE: Multidetector CT imaging of the head and cervical spine was performed following the standard protocol without intravenous contrast. Multiplanar CT image reconstructions of the cervical spine were also generated. COMPARISON:  None. FINDINGS: CT HEAD FINDINGS Brain: No evidence of acute infarction, hemorrhage, hydrocephalus, extra-axial collection or mass lesion/mass effect. Vascular: No hyperdense vessel or unexpected calcification. Skull: Normal. Negative for fracture or focal lesion. Sinuses/Orbits: No acute finding. Other: None. CT CERVICAL SPINE FINDINGS Alignment: Normal. Skull base and vertebrae: No acute fracture. No primary bone lesion or focal pathologic process. Soft tissues and spinal canal: No prevertebral fluid or swelling. No  visible canal hematoma. Disc levels: Anterior fusion at C5-C6-C7 again noted. Multilevel degenerative disc disease, spondylosis and facet arthropathy noted, mild to moderate at C4-5 and C7-T1. Upper chest: No acute abnormality. Other: None IMPRESSION: 1. No evidence of intracranial abnormality. 2. No static evidence of acute injury to the cervical spine. Fusion and degenerative changes as described. Electronically Signed   By: Margarette Canada M.D.   On: 12/04/2019 15:25   CT CERVICAL SPINE WO CONTRAST  Result Date: 12/04/2019 CLINICAL DATA:  64 year old male with continued headache and RIGHT neck pain following fall 4 days ago. Initial encounter. EXAM: CT HEAD WITHOUT CONTRAST CT CERVICAL SPINE WITHOUT CONTRAST TECHNIQUE: Multidetector CT imaging of the head and cervical spine was performed following the standard protocol without intravenous contrast. Multiplanar CT image  reconstructions of the cervical spine were also generated. COMPARISON:  None. FINDINGS: CT HEAD FINDINGS Brain: No evidence of acute infarction, hemorrhage, hydrocephalus, extra-axial collection or mass lesion/mass effect. Vascular: No hyperdense vessel or unexpected calcification. Skull: Normal. Negative for fracture or focal lesion. Sinuses/Orbits: No acute finding. Other: None. CT CERVICAL SPINE FINDINGS Alignment: Normal. Skull base and vertebrae: No acute fracture. No primary bone lesion or focal pathologic process. Soft tissues and spinal canal: No prevertebral fluid or swelling. No visible canal hematoma. Disc levels: Anterior fusion at C5-C6-C7 again noted. Multilevel degenerative disc disease, spondylosis and facet arthropathy noted, mild to moderate at C4-5 and C7-T1. Upper chest: No acute abnormality. Other: None IMPRESSION: 1. No evidence of intracranial abnormality. 2. No static evidence of acute injury to the cervical spine. Fusion and degenerative changes as described. Electronically Signed   By: Margarette Canada M.D.   On: 12/04/2019 15:25   DG Chest Portable 1 View  Result Date: 12/04/2019 CLINICAL DATA:  Confusion. EXAM: PORTABLE CHEST 1 VIEW COMPARISON:  11/07/2012 and prior radiographs FINDINGS: The cardiomediastinal silhouette is unremarkable. There is no evidence of focal airspace disease, pulmonary edema, suspicious pulmonary nodule/mass, pleural effusion, or pneumothorax. No acute bony abnormalities are identified. Cervical spine fusion changes again noted. IMPRESSION: No active cardiopulmonary disease. Electronically Signed   By: Margarette Canada M.D.   On: 12/04/2019 16:55    ____________________________________________   PROCEDURES  Procedure(s) performed (including Critical Care):  Procedures  ____________________________________________   INITIAL IMPRESSION / ASSESSMENT AND PLAN     64 year old male presenting to the emergency department for treatment and evaluation of  confusion and rash.  See HPI for further details.  Neuro exam is reassuring.  Rash appears to be as shingles.  While awaiting ER room assignment, labs were drawn and it does appear that he does have an acute kidney injury likely secondary to dehydration as well as hyperglycemia without DKA, and hyponatremia with a corrected sodium level of 127.  Plan will be to admit the patient for dehydration, confusion, and hyperglycemia.   ----------------------------------------- 6:50 PM on 12/04/2019 ----------------------------------------- Patient accepted for admission.   DIFFERENTIAL DIAGNOSIS  CVA, DKA, dementia, hyperglycemia, dehydration,   ____________________________________________   FINAL CLINICAL IMPRESSION(S) / ED DIAGNOSES  Final diagnoses:  Dehydration  Confusion  Hyperglycemia  Herpes zoster without complication     ED Discharge Orders    None       Rayland Hamed was evaluated in Emergency Department on 12/04/2019 for the symptoms described in the history of present illness. He was evaluated in the context of the global COVID-19 pandemic, which necessitated consideration that the patient might be at risk for infection with the SARS-CoV-2 virus that causes COVID-19. Institutional protocols  and algorithms that pertain to the evaluation of patients at risk for COVID-19 are in a state of rapid change based on information released by regulatory bodies including the CDC and federal and state organizations. These policies and algorithms were followed during the patient's care in the ED.   Note:  This document was prepared using Dragon voice recognition software and may include unintentional dictation errors.   Victorino Dike, FNP 12/04/19 1851    Arta Silence, MD 12/04/19 2002

## 2019-12-04 NOTE — H&P (Addendum)
History and Physical    [name] [name] DOB: 1955/11/27 DOA: 12/04/2019  Referring MD/NP/PA:   PCP: Theotis Burrow, MD   Patient coming from:  The patient is coming from home.  At baseline, pt is independent for most of ADL.        Chief Complaint: altered mental status for 3 days  HPI: [name] is a 64 y.o. male with medical history significant of HTN, DM2, OSA, PTSD, h/o TBI with coma, h/o UC and colon cancer s/p colectomy, presented to ED with altered mental status for 3 days.   Per ED report, pt had confusion, slow speech and difficulty processing word for the past 3 days. As I interviewed pt, he was alert and oriented without confusion or speech problem. He states that he is easy to have diarrhea since his colectomy. He started to have diarrhea 4 to 5 days ago, nonbloody, as well as several episodes of non bloody vomiting. He takes Imodium as needed. He denies abdominal pain. 3 days ago, when he was bending down to pick up his granddaughter, he fell to his right side and hit his head on the door frame. Since then he developed some rash at the back side of the neck on the right side some on his shoulder. He feels burning pain on the side of the rash. He denies fever, chills, cough, chest pain, dysuria, difficulty swallowing, or focal weakness.  ED Course: Patient was afebrile, BP 104/64-163/91, no hypoxia.labs significant for glucose 457, Na 121, corrected 127, K 5.2, chloride 94, BUN/Cr 38/2.14, was 24/1.24 on 11/26/2018, head and cervical spine CT no acute abnormalities. CXR negative.  Hospitalist is called for admission for stroke work-up due to AMS.   Review of Systems:   General: no fevers, chills, no body weight gain.  HEENT: no blurry vision, hearing changes or sore throat Respiratory: no dyspnea, coughing, wheezing CV: no chest pain, no palpitations GI: + nausea, + vomiting, + diarrhea, no abdominal pain GU: no dysuria, burning on urination, increased  urinary frequency, hematuria  Ext: no leg edema Neuro: no unilateral weakness, numbness, or tingling, no vision change or hearing loss Skin:+ rash at right back of neck and shoulder, no skin tear. MSK: No muscle spasm, no deformity, no limitation of range of movement in spin Heme: No easy bruising.  Travel history: No recent long distant travel.  Allergy:  Allergies  Allergen Reactions  . Sulfa Antibiotics Rash    Past Medical History:  Diagnosis Date  . Diabetes mellitus without complication ([place])     No past surgical history on file.  Social History:  has no history on file for tobacco, alcohol, and drug.  Family History: No family history on file.   Prior to Admission medications   Medication Sig Start Date End Date Taking? Authorizing Provider  atorvastatin (LIPITOR) 40 MG tablet Take 40 mg by mouth daily. 03/14/17  Yes [provider]  diazepam (VALIUM) 5 MG tablet Take 5 mg by mouth 2 (two) times daily as needed for anxiety. 10/01/19  Yes [provider]  diphenoxylate-atropine (LOMOTIL) 2.5-0.025 MG tablet Take 1 tablet by mouth 4 (four) times daily as needed for diarrhea or loose stools. 11/02/19  Yes [provider]  LANTUS SOLOSTAR 100 UNIT/ML Solostar Pen Inject 35 Units into the skin at bedtime. 09/29/19  Yes [provider]  lisinopril (PRINIVIL,ZESTRIL) 5 MG tablet Take 5 mg by mouth daily. 05/23/17  Yes [provider]  metFORMIN (GLUCOPHAGE) 1000 MG tablet  Take 1,000 mg by mouth 2 (two) times daily. 04/08/17  Yes [provider]  omeprazole (PRILOSEC) 40 MG capsule Take 40 mg by mouth daily.    Yes [provider]  venlafaxine XR (EFFEXOR-XR) 150 MG 24 hr capsule Take 150 mg by mouth daily. 03/20/17  Yes [provider]    Physical Exam: Vitals:   12/04/19 2230 12/04/19 2328 12/05/19 0002 12/05/19 0007  BP: 132/78 (!) 141/85 (!) 163/91   Pulse:  (!) 101 (!) 105   Resp:  16 18   Temp:   97.6 F  (36.4 C)   TempSrc:   Oral   SpO2:  100% 100%   Weight:    87.2 kg  Height:    5' 10"  (1.778 m)   General: Not in acute distress HEENT:       Eyes: PERRL, EOMI, no scleral icterus.       ENT: No discharge from the ears and nose, no pharynx injection, no tonsillar enlargement.        Neck: No JVD, no bruit, no mass felt. Heme: No neck lymph node enlargement. Cardiac: S1/S2, RRR, No murmurs, No gallops or rubs. Respiratory: Good air movement bilaterally. No rales, wheezing, rhonchi or rubs. GI: Soft, nondistended, nontender, no rebound pain, no organomegaly, BS present. GU: No hematuria Ext: No pitting leg edema bilaterally. 2+DP/PT pulse bilaterally. Musculoskeletal: No joint deformities, No joint redness or warmth, no limitation of ROM in spin. Skin: Erythematous pimples and vesicles over back of right side of neck and shoulder.  Neuro: Alert, oriented X3, cranial nerves II-XII grossly intact, moves all extremities normally. Muscle strength 5/5 in all extremities, sensation to light touch intact. Normal finger to nose test. Psych: Patient is not psychotic.  Labs on Admission: I have personally reviewed following labs and imaging studies  CBC: Recent Labs  Lab 12/04/19 1421  WBC 7.1  NEUTROABS 5.9  HGB 14.2  HCT 38.8*  MCV 80.5  PLT 458*   Basic Metabolic Panel: Recent Labs  Lab 12/04/19 1421  NA 121*  K 5.2*  CL 94*  CO2 15*  GLUCOSE 457*  BUN 38*  CREATININE 2.14*  CALCIUM 8.5*   GFR: Estimated Creatinine Clearance: 36 mL/min (A) (by C-G formula based on SCr of 2.14 mg/dL (H)). Liver Function Tests: Recent Labs  Lab 12/04/19 1421  AST 29  ALT 30  ALKPHOS 222*  BILITOT 1.3*  PROT 7.1  ALBUMIN 4.0   No results for input(s): LIPASE, AMYLASE in the last 168 hours. No results for input(s): AMMONIA in the last 168 hours. Coagulation Profile: No results for input(s): INR, PROTIME in the last 168 hours. Cardiac Enzymes: No results for input(s): CKTOTAL,  CKMB, CKMBINDEX, TROPONINI in the last 168 hours. BNP (last 3 results) No results for input(s): PROBNP in the last 8760 hours. HbA1C: No results for input(s): HGBA1C in the last 72 hours. CBG: Recent Labs  Lab 12/04/19 1935 12/04/19 2221  GLUCAP 368* 323*   Lipid Profile: No results for input(s): CHOL, HDL, LDLCALC, TRIG, CHOLHDL, LDLDIRECT in the last 72 hours. Thyroid Function Tests: No results for input(s): TSH, T4TOTAL, FREET4, T3FREE, THYROIDAB in the last 72 hours. Anemia Panel: No results for input(s): VITAMINB12, FOLATE, FERRITIN, TIBC, IRON, RETICCTPCT in the last 72 hours. Urine analysis:    Component Value Date/Time   COLORURINE Yellow 10/22/2014 1107   APPEARANCEUR Clear 10/22/2014 1107   LABSPEC 1.015 10/22/2014 1107   PHURINE 6.0 10/22/2014 1107   GLUCOSEU Negative 10/22/2014 1107  HGBUR Negative 10/22/2014 1107   BILIRUBINUR Negative 10/22/2014 1107   KETONESUR Negative 10/22/2014 1107   PROTEINUR 30 mg/dL 10/22/2014 1107   NITRITE Negative 10/22/2014 1107   LEUKOCYTESUR Negative 10/22/2014 1107   Sepsis Labs: @LABRCNTIP (procalcitonin:4,lacticidven:4) )No results found for this or any previous visit (from the past 240 hour(s)).   Radiological Exams on Admission: CT HEAD WO CONTRAST  Result Date: 12/04/2019 CLINICAL DATA:  64 year old male with continued headache and RIGHT neck pain following fall 4 days ago. Initial encounter. EXAM: CT HEAD WITHOUT CONTRAST CT CERVICAL SPINE WITHOUT CONTRAST TECHNIQUE: Multidetector CT imaging of the head and cervical spine was performed following the standard protocol without intravenous contrast. Multiplanar CT image reconstructions of the cervical spine were also generated. COMPARISON:  None. FINDINGS: CT HEAD FINDINGS Brain: No evidence of acute infarction, hemorrhage, hydrocephalus, extra-axial collection or mass lesion/mass effect. Vascular: No hyperdense vessel or unexpected calcification. Skull: Normal. Negative for  fracture or focal lesion. Sinuses/Orbits: No acute finding. Other: None. CT CERVICAL SPINE FINDINGS Alignment: Normal. Skull base and vertebrae: No acute fracture. No primary bone lesion or focal pathologic process. Soft tissues and spinal canal: No prevertebral fluid or swelling. No visible canal hematoma. Disc levels: Anterior fusion at C5-C6-C7 again noted. Multilevel degenerative disc disease, spondylosis and facet arthropathy noted, mild to moderate at C4-5 and C7-T1. Upper chest: No acute abnormality. Other: None IMPRESSION: 1. No evidence of intracranial abnormality. 2. No static evidence of acute injury to the cervical spine. Fusion and degenerative changes as described. Electronically Signed   By: Margarette Canada M.D.   On: 12/04/2019 15:25   CT CERVICAL SPINE WO CONTRAST  Result Date: 12/04/2019 CLINICAL DATA:  64 year old male with continued headache and RIGHT neck pain following fall 4 days ago. Initial encounter. EXAM: CT HEAD WITHOUT CONTRAST CT CERVICAL SPINE WITHOUT CONTRAST TECHNIQUE: Multidetector CT imaging of the head and cervical spine was performed following the standard protocol without intravenous contrast. Multiplanar CT image reconstructions of the cervical spine were also generated. COMPARISON:  None. FINDINGS: CT HEAD FINDINGS Brain: No evidence of acute infarction, hemorrhage, hydrocephalus, extra-axial collection or mass lesion/mass effect. Vascular: No hyperdense vessel or unexpected calcification. Skull: Normal. Negative for fracture or focal lesion. Sinuses/Orbits: No acute finding. Other: None. CT CERVICAL SPINE FINDINGS Alignment: Normal. Skull base and vertebrae: No acute fracture. No primary bone lesion or focal pathologic process. Soft tissues and spinal canal: No prevertebral fluid or swelling. No visible canal hematoma. Disc levels: Anterior fusion at C5-C6-C7 again noted. Multilevel degenerative disc disease, spondylosis and facet arthropathy noted, mild to moderate at C4-5  and C7-T1. Upper chest: No acute abnormality. Other: None IMPRESSION: 1. No evidence of intracranial abnormality. 2. No static evidence of acute injury to the cervical spine. Fusion and degenerative changes as described. Electronically Signed   By: Margarette Canada M.D.   On: 12/04/2019 15:25   DG Chest Portable 1 View  Result Date: 12/04/2019 CLINICAL DATA:  Confusion. EXAM: PORTABLE CHEST 1 VIEW COMPARISON:  11/07/2012 and prior radiographs FINDINGS: The cardiomediastinal silhouette is unremarkable. There is no evidence of focal airspace disease, pulmonary edema, suspicious pulmonary nodule/mass, pleural effusion, or pneumothorax. No acute bony abnormalities are identified. Cervical spine fusion changes again noted. IMPRESSION: No active cardiopulmonary disease. Electronically Signed   By: Margarette Canada M.D.   On: 12/04/2019 16:55    EKG: Independently reviewed.    Assessment/Plan Principal Problem:   Dehydration Active Problems:   Essential hypertension   Panic attack   Obstructive sleep  apnea   Post traumatic stress disorder (PTSD)   Status post intestinal bypass or anastomosis   TBI (traumatic brain injury) ([place])   Uncontrolled type 2 diabetes mellitus ([place])   Hyponatremia   Diarrhea  This 64 y.o. male with medical history significant of HTN, DM2, OSA, PTSD, h/o TBI with coma, h/o UC and colon cancer s/p colectomy, presented with vomit and diarrhea for 4-5 days and altered mental status for 3 days.   Dehydration and AKI due to vomit an diarrhea Hyponatremia due to dehydration Mild hyperkalemia due to AKI Na 121, corrected 127,  K 5.2,  chloride 94,  BUN/Cr 38/2.14, was 24/1.24 on 11/26/2018 Per care everywhere Will check GI pathogen and C. difficile, patient denies recent antibiotic use.  IVF  S/p fall and Altered mental status AMS resolved now head and cervical spine CT no acute abnormalities Not clear hitting head caused brain contusion vs VZV encephalitis given shingles rash?  Consider IV acyclovir and ID consult if persistent neuro symptoms. Not suspect stroke at this point.   Possible shingles on neck and shoulder Acyclovir oral started in ED, had 3 doses so far, consider change to IV if suspect VZV encephalitis, 53m/Kg Q12h due to CrCl 42.8.  Essential hypertension Hold lisinopril due to AKI and hyperkalemia Monitor BP and adjust med    DM2 Home takes Lantus 35 units at bedtime and Metformin cw lantus and adjust dose Hold metformin due to AKI  HLD Statin  PTSD and panic attack Continue Effexor and as needed Valium  DVT ppx: SQ Heparin       Code Status: Full code Family Communication: None at bed side.               Disposition Plan:  Anticipate discharge back to previous home environment Consults called:   Admission status:  medical floor/obs          Date of Service 12/04/2019    [facility]   If 7PM-7AM, please contact night-coverage [email or web address] Password [email or web address]

## 2019-12-04 NOTE — ED Notes (Signed)
Dr Cherylann Banas notified of CBG 368. To place new orders

## 2019-12-05 ENCOUNTER — Encounter: Payer: Self-pay | Admitting: Internal Medicine

## 2019-12-05 ENCOUNTER — Other Ambulatory Visit: Payer: Self-pay

## 2019-12-05 DIAGNOSIS — Z7982 Long term (current) use of aspirin: Secondary | ICD-10-CM | POA: Diagnosis not present

## 2019-12-05 DIAGNOSIS — G4733 Obstructive sleep apnea (adult) (pediatric): Secondary | ICD-10-CM | POA: Diagnosis present

## 2019-12-05 DIAGNOSIS — E860 Dehydration: Secondary | ICD-10-CM | POA: Diagnosis present

## 2019-12-05 DIAGNOSIS — E871 Hypo-osmolality and hyponatremia: Secondary | ICD-10-CM | POA: Diagnosis present

## 2019-12-05 DIAGNOSIS — Z794 Long term (current) use of insulin: Secondary | ICD-10-CM | POA: Diagnosis not present

## 2019-12-05 DIAGNOSIS — E875 Hyperkalemia: Secondary | ICD-10-CM | POA: Diagnosis present

## 2019-12-05 DIAGNOSIS — B027 Disseminated zoster: Secondary | ICD-10-CM | POA: Diagnosis present

## 2019-12-05 DIAGNOSIS — I10 Essential (primary) hypertension: Secondary | ICD-10-CM | POA: Diagnosis present

## 2019-12-05 DIAGNOSIS — E785 Hyperlipidemia, unspecified: Secondary | ICD-10-CM | POA: Diagnosis present

## 2019-12-05 DIAGNOSIS — B029 Zoster without complications: Secondary | ICD-10-CM | POA: Diagnosis present

## 2019-12-05 DIAGNOSIS — Z20822 Contact with and (suspected) exposure to covid-19: Secondary | ICD-10-CM | POA: Diagnosis present

## 2019-12-05 DIAGNOSIS — E1165 Type 2 diabetes mellitus with hyperglycemia: Secondary | ICD-10-CM | POA: Diagnosis not present

## 2019-12-05 DIAGNOSIS — R197 Diarrhea, unspecified: Secondary | ICD-10-CM

## 2019-12-05 DIAGNOSIS — R4182 Altered mental status, unspecified: Secondary | ICD-10-CM | POA: Diagnosis present

## 2019-12-05 DIAGNOSIS — W19XXXA Unspecified fall, initial encounter: Secondary | ICD-10-CM | POA: Diagnosis present

## 2019-12-05 DIAGNOSIS — T380X5A Adverse effect of glucocorticoids and synthetic analogues, initial encounter: Secondary | ICD-10-CM | POA: Diagnosis not present

## 2019-12-05 DIAGNOSIS — S069X9A Unspecified intracranial injury with loss of consciousness of unspecified duration, initial encounter: Secondary | ICD-10-CM | POA: Diagnosis present

## 2019-12-05 DIAGNOSIS — F4310 Post-traumatic stress disorder, unspecified: Secondary | ICD-10-CM | POA: Diagnosis present

## 2019-12-05 DIAGNOSIS — Z79899 Other long term (current) drug therapy: Secondary | ICD-10-CM | POA: Diagnosis not present

## 2019-12-05 DIAGNOSIS — R519 Headache, unspecified: Secondary | ICD-10-CM | POA: Diagnosis present

## 2019-12-05 DIAGNOSIS — F410 Panic disorder [episodic paroxysmal anxiety] without agoraphobia: Secondary | ICD-10-CM | POA: Diagnosis present

## 2019-12-05 DIAGNOSIS — W2209XA Striking against other stationary object, initial encounter: Secondary | ICD-10-CM | POA: Diagnosis present

## 2019-12-05 DIAGNOSIS — Z882 Allergy status to sulfonamides status: Secondary | ICD-10-CM | POA: Diagnosis not present

## 2019-12-05 DIAGNOSIS — Z9049 Acquired absence of other specified parts of digestive tract: Secondary | ICD-10-CM | POA: Diagnosis not present

## 2019-12-05 DIAGNOSIS — Z85038 Personal history of other malignant neoplasm of large intestine: Secondary | ICD-10-CM | POA: Diagnosis not present

## 2019-12-05 DIAGNOSIS — N179 Acute kidney failure, unspecified: Secondary | ICD-10-CM | POA: Diagnosis present

## 2019-12-05 LAB — GLUCOSE, CAPILLARY
Glucose-Capillary: 193 mg/dL — ABNORMAL HIGH (ref 70–99)
Glucose-Capillary: 169 mg/dL — ABNORMAL HIGH (ref 70–99)
Glucose-Capillary: 177 mg/dL — ABNORMAL HIGH (ref 70–99)
Glucose-Capillary: 264 mg/dL — ABNORMAL HIGH (ref 70–99)

## 2019-12-05 LAB — BASIC METABOLIC PANEL
Anion gap: 8 (ref 5–15)
BUN: 32 mg/dL — ABNORMAL HIGH (ref 8–23)
CO2: 24 mmol/L (ref 22–32)
Calcium: 8.4 mg/dL — ABNORMAL LOW (ref 8.9–10.3)
Chloride: 100 mmol/L (ref 98–111)
Creatinine, Ser: 1.8 mg/dL — ABNORMAL HIGH (ref 0.61–1.24)
GFR calc Af Amer: 45 mL/min — ABNORMAL LOW (ref >60)
GFR calc non Af Amer: 39 mL/min — ABNORMAL LOW (ref >60)
Glucose, Bld: 246 mg/dL — ABNORMAL HIGH (ref 70–99)
Potassium: 4.3 mmol/L (ref 3.5–5.1)
Sodium: 132 mmol/L — ABNORMAL LOW (ref 135–145)

## 2019-12-05 LAB — CBC
HCT: 34.2 % — ABNORMAL LOW (ref 39.0–52.0)
Hemoglobin: 12.2 g/dL — ABNORMAL LOW (ref 13.0–17.0)
MCH: 29 pg (ref 26.0–34.0)
MCHC: 35.7 g/dL (ref 30.0–36.0)
MCV: 81.4 fL (ref 80.0–100.0)
Platelets: 142 10*3/uL — ABNORMAL LOW (ref 150–400)
RBC: 4.2 MIL/uL — ABNORMAL LOW (ref 4.22–5.81)
RDW: 12.3 % (ref 11.5–15.5)
WBC: 5 10*3/uL (ref 4.0–10.5)
nRBC: 0 % (ref 0.0–0.2)

## 2019-12-05 LAB — SARS CORONAVIRUS 2 (TAT 6-24 HRS): SARS Coronavirus 2: NEGATIVE

## 2019-12-05 LAB — LIPID PANEL
Cholesterol: 86 mg/dL (ref 0–200)
HDL: 39 mg/dL — ABNORMAL LOW (ref >40)
LDL Cholesterol: 33 mg/dL (ref 0–99)
Total CHOL/HDL Ratio: 2.2 RATIO
Triglycerides: 72 mg/dL (ref <150)
VLDL: 14 mg/dL (ref 0–40)

## 2019-12-05 LAB — HEMOGLOBIN A1C
Hgb A1c MFr Bld: 9.8 % — ABNORMAL HIGH (ref 4.8–5.6)
Mean Plasma Glucose: 234.56 mg/dL

## 2019-12-05 LAB — C DIFFICILE QUICK SCREEN W PCR REFLEX
C Diff antigen: NEGATIVE
C Diff interpretation: NOT DETECTED
C Diff toxin: NEGATIVE

## 2019-12-05 LAB — HIV ANTIBODY (ROUTINE TESTING W REFLEX): HIV Screen 4th Generation wRfx: NONREACTIVE

## 2019-12-05 MED ORDER — DIAZEPAM 5 MG PO TABS: 5.0000 mg | ORAL_TABLET | Freq: Two times a day (BID) | ORAL | Status: DC | PRN

## 2019-12-05 MED ORDER — PREDNISONE 20 MG PO TABS: 10.0000 mg | ORAL_TABLET | Freq: Every day | ORAL | Status: DC

## 2019-12-05 MED ORDER — LISINOPRIL 10 MG PO TABS: 5.0000 mg | ORAL_TABLET | Freq: Every day | ORAL | Status: DC

## 2019-12-05 MED ORDER — PREDNISONE 20 MG PO TABS: 40.0000 mg | ORAL_TABLET | Freq: Every day | ORAL | Status: DC

## 2019-12-05 MED ORDER — HEPARIN SODIUM (PORCINE) 5000 UNIT/ML IJ SOLN
5000.0000 [IU] | Freq: Three times a day (TID) | INTRAMUSCULAR | Status: DC
  Administered 2019-12-05 – 2019-12-07 (×7): 5000 [IU] via SUBCUTANEOUS
  Filled 2019-12-05 (×7): qty 1

## 2019-12-05 MED ORDER — INSULIN GLARGINE 100 UNIT/ML ~~LOC~~ SOLN
27.0000 [IU] | Freq: Every day | SUBCUTANEOUS | Status: DC
  Administered 2019-12-05: 27 [IU] via SUBCUTANEOUS
  Filled 2019-12-05 (×2): qty 0.27

## 2019-12-05 MED ORDER — DIPHENOXYLATE-ATROPINE 2.5-0.025 MG PO TABS: 1.0000 | ORAL_TABLET | Freq: Four times a day (QID) | ORAL | Status: DC | PRN

## 2019-12-05 MED ORDER — GABAPENTIN 600 MG PO TABS
300.0000 mg | ORAL_TABLET | Freq: Every day | ORAL | Status: DC
  Administered 2019-12-05 – 2019-12-06 (×2): 300 mg via ORAL
  Filled 2019-12-05 (×2): qty 1

## 2019-12-05 MED ORDER — PREDNISONE 50 MG PO TABS: 50.0000 mg | ORAL_TABLET | Freq: Every day | ORAL | Status: DC

## 2019-12-05 MED ORDER — PREDNISONE 20 MG PO TABS: 30.0000 mg | ORAL_TABLET | Freq: Every day | ORAL | Status: DC

## 2019-12-05 MED ORDER — VENLAFAXINE HCL ER 75 MG PO CP24
150.0000 mg | ORAL_CAPSULE | Freq: Every day | ORAL | Status: DC
  Administered 2019-12-05 – 2019-12-07 (×3): 150 mg via ORAL
  Filled 2019-12-05 (×3): qty 2

## 2019-12-05 MED ORDER — PANTOPRAZOLE SODIUM 40 MG PO TBEC
40.0000 mg | DELAYED_RELEASE_TABLET | Freq: Every day | ORAL | Status: DC
  Administered 2019-12-05 – 2019-12-07 (×3): 40 mg via ORAL
  Filled 2019-12-05 (×3): qty 1

## 2019-12-05 MED ORDER — PREDNISONE 20 MG PO TABS
60.0000 mg | ORAL_TABLET | Freq: Every day | ORAL | Status: DC
  Filled 2019-12-05: qty 3

## 2019-12-05 MED ORDER — DEXTROSE 5 % IV SOLN: 10.0000 mg/kg | Freq: Three times a day (TID) | INTRAVENOUS | Status: DC

## 2019-12-05 MED ORDER — PREDNISONE 20 MG PO TABS: 20.0000 mg | ORAL_TABLET | Freq: Every day | ORAL | Status: DC

## 2019-12-05 MED ORDER — ATORVASTATIN CALCIUM 20 MG PO TABS
40.0000 mg | ORAL_TABLET | Freq: Every day | ORAL | Status: DC
  Administered 2019-12-05 – 2019-12-06 (×2): 40 mg via ORAL
  Filled 2019-12-05 (×2): qty 2

## 2019-12-05 MED ORDER — DEXTROSE 5 % IV SOLN
10.0000 mg/kg | Freq: Two times a day (BID) | INTRAVENOUS | Status: DC
  Administered 2019-12-05 (×2): 870 mg via INTRAVENOUS
  Filled 2019-12-05 (×4): qty 17.4

## 2019-12-05 NOTE — Progress Notes (Addendum)
PROGRESS NOTE    [name]  XJD:[id number] DOB: [birth year] DOA: 12/04/2019 PCP: Theotis Burrow, MD   Brief Narrative: [name] is [name] 64 y.o. male with medical history significant of HTN, DM2, OSA, PTSD, h/o TBI with coma, h/o UC and colon cancer s/p colectomy, presented to ED with altered mental status for 3 days.   Per ED report, pt had confusion, slow speech and difficulty processing word for the past 3 days. As I interviewed pt, he was alert and oriented without confusion or speech problem. He states that he is easy to have diarrhea since his colectomy. He started to have diarrhea 4 to 5 days ago, nonbloody, as well as several episodes of non bloody vomiting. He takes Imodium as needed. He denies abdominal pain. 3 days ago, when he was bending down to pick up his granddaughter, he fell to his right side and hit his head on the door frame. Since then he developed some rash at the back side of the neck on the right side some on his shoulder. He feels burning pain on the side of the rash. He denies fever, chills, cough, chest pain, dysuria, difficulty swallowing, or focal weakness.  Assessment & Plan:   Principal Problem:   Dehydration Active Problems:   Essential hypertension   Panic attack   Obstructive sleep apnea   Post traumatic stress disorder (PTSD)   Status post intestinal bypass or anastomosis   TBI (traumatic brain injury) ([place])   Uncontrolled type 2 diabetes mellitus ([place])   Hyponatremia   Diarrhea  Disseminated Zoster: started after episode of trauma after fall. Involving posterior neck and upper back and chest.  Zedric Deroy&Ox3 without facial paralysis, involvement of auricle or auditory canal.  No change in taste or hearing.  There was report of confusion, though he was not confused on my partners evaluation.  Will discuss further with family.  C/o pain burning pain to neck at site of rash.  Continue acyclovir IV - adjusted for renal function Will start gabapentin  and steroids given discomfort Consider discussion with ID  AKI: baseline of 1.24 in 2020. 2.14 on presentation.  2/2 dehydration with vomiting and diarrhea. Improving with IVF  Hyponatremia: 2/2 hyperglycemia and dehydration.  Improving.  Hyperkalemia: improved  S/p fall and Altered mental status AMS appears resolved now head and cervical spine CT no acute abnormalities Fall seemed mechanical, related to playing with granddaughter AMS was resolved on presentation, consider ID discussion  Essential hypertension Hold lisinopril due to AKI and hyperkalemia Monitor BP and adjust med    DM2 Home takes Lantus 35 units at bedtime and Metformin cw lantus and adjust dose Hold metformin due to AKI  HLD Statin  PTSD and panic attack Continue Effexor and as needed Valium  DVT prophylaxis: heparin Code Status: full Family Communication: none at bedside - stepson updated Disposition Plan:  . Patient came from: home            . Anticipated d/c place: home . Barriers to d/c OR conditions which need to be met to effect Maddisyn Hegwood safe d/c: pending improvement in pain   Consultants:   none  Procedures:   none  Antimicrobials: Anti-infectives (From admission, onward)   Start     Dose/Rate Route Frequency Ordered Stop   12/05/19 1400  acyclovir (ZOVIRAX) 870 mg in dextrose 5 % 150 mL IVPB     10 mg/kg  87.2 kg 167.4 mL/hr over 60 Minutes Intravenous Every 12 hours 12/05/19 0622  12/05/19 0615  acyclovir (ZOVIRAX) 870 mg in dextrose 5 % 150 mL IVPB  Status:  Discontinued     10 mg/kg  87.2 kg 167.4 mL/hr over 60 Minutes Intravenous Every 8 hours 12/05/19 0613 12/05/19 0622   12/04/19 1800  acyclovir (ZOVIRAX) 200 MG capsule 800 mg  Status:  Discontinued     800 mg Oral 5 times daily 12/04/19 1643 12/05/19 0613         Subjective: C/o burning pain to neck and shoulder  Objective: Vitals:   12/05/19 0007 12/05/19 0919 12/05/19 1149 12/05/19 1715  BP:  129/83 114/74     Pulse:  92 96   Resp:  20 16 18   Temp:  97.8 F (36.6 C) 98.4 F (36.9 C)   TempSrc:  Oral Oral   SpO2:  100% 97%   Weight: 87.2 kg     Height: 5' 10"  (1.778 m)       Intake/Output Summary (Last 24 hours) at 12/05/2019 1841 Last data filed at 12/05/2019 4132 Gross per 24 hour  Intake 1651.81 ml  Output 1400 ml  Net 251.81 ml   Filed Weights   12/04/19 1404 12/05/19 0007  Weight: 89.8 kg 87.2 kg    Examination:  General exam: Appears calm and comfortable  Respiratory system: Clear to auscultation. Respiratory effort normal. Cardiovascular system: S1 & S2 heard, RRR.  Gastrointestinal system: Abdomen is nondistended, soft and nontender. Central nervous system: Alert and oriented. No focal neurological deficits. Extremities: no LEE Skin: erythematous rash spreading from back of neck to R shoulder, back, chest with scattered vesicles at various states of healing Psychiatry: Judgement and insight appear normal. Mood & affect appropriate.     Data Reviewed: I have personally reviewed following labs and imaging studies  CBC: Recent Labs  Lab 12/04/19 1421 12/05/19 0355  WBC 7.1 5.0  NEUTROABS 5.9  --   HGB 14.2 12.2*  HCT 38.8* 34.2*  MCV 80.5 81.4  PLT 142* 440*   Basic Metabolic Panel: Recent Labs  Lab 12/04/19 1421 12/05/19 0355  NA 121* 132*  K 5.2* 4.3  CL 94* 100  CO2 15* 24  GLUCOSE 457* 246*  BUN 38* 32*  CREATININE 2.14* 1.80*  CALCIUM 8.5* 8.4*   GFR: Estimated Creatinine Clearance: 42.8 mL/min (Ixchel Duck) (by C-G formula based on SCr of 1.8 mg/dL (H)). Liver Function Tests: Recent Labs  Lab 12/04/19 1421  AST 29  ALT 30  ALKPHOS 222*  BILITOT 1.3*  PROT 7.1  ALBUMIN 4.0   No results for input(s): LIPASE, AMYLASE in the last 168 hours. No results for input(s): AMMONIA in the last 168 hours. Coagulation Profile: No results for input(s): INR, PROTIME in the last 168 hours. Cardiac Enzymes: No results for input(s): CKTOTAL, CKMB, CKMBINDEX,  TROPONINI in the last 168 hours. BNP (last 3 results) No results for input(s): PROBNP in the last 8760 hours. HbA1C: Recent Labs    12/05/19 0355  HGBA1C 9.8*   CBG: Recent Labs  Lab 12/04/19 1935 12/04/19 2221 12/05/19 0758 12/05/19 1146 12/05/19 1654  GLUCAP 368* 323* 193* 169* 177*   Lipid Profile: Recent Labs    12/05/19 0355  CHOL 86  HDL 39*  LDLCALC 33  TRIG 72  CHOLHDL 2.2   Thyroid Function Tests: No results for input(s): TSH, T4TOTAL, FREET4, T3FREE, THYROIDAB in the last 72 hours. Anemia Panel: No results for input(s): VITAMINB12, FOLATE, FERRITIN, TIBC, IRON, RETICCTPCT in the last 72 hours. Sepsis Labs: No results for input(s): PROCALCITON,  LATICACIDVEN in the last 168 hours.  Recent Results (from the past 240 hour(s))  SARS CORONAVIRUS 2 (TAT 6-24 HRS) Nasopharyngeal Nasopharyngeal Swab     Status: None   Collection Time: 12/04/19  4:20 PM   Specimen: Nasopharyngeal Swab  Result Value Ref Range Status   SARS Coronavirus 2 NEGATIVE NEGATIVE Final    Comment: (NOTE) SARS-CoV-2 target nucleic acids are NOT DETECTED. The SARS-CoV-2 RNA is generally detectable in upper and lower respiratory specimens during the acute phase of infection. Negative results do not preclude SARS-CoV-2 infection, do not rule out co-infections with other pathogens, and should not be used as the sole basis for treatment or other patient management decisions. Negative results must be combined with clinical observations, patient history, and epidemiological information. The expected result is Negative. Fact Sheet for Patients: SugarRoll.be Fact Sheet for Healthcare Providers: https://www.woods-mathews.com/ This test is not yet approved or cleared by the [place] FDA and  has been authorized for detection and/or diagnosis of SARS-CoV-2 by FDA under an Emergency Use Authorization (EUA). This EUA will remain  in effect (meaning this  test can be used) for the duration of the COVID-19 declaration under Section 56 4(b)(1) of the Act, 21 U.S.C. section 360bbb-3(b)(1), unless the authorization is terminated or revoked sooner. Performed at [facility], [place] [address] [place], [place] [address]   C Difficile Quick Screen w PCR reflex     Status: None   Collection Time: 12/05/19  5:00 AM   Specimen: Stool  Result Value Ref Range Status   C Diff antigen NEGATIVE NEGATIVE Final   C Diff toxin NEGATIVE NEGATIVE Final   C Diff interpretation No C. difficile detected.  Final    Comment: Performed at [facility], [address] [place], [place] [address]         Radiology Studies: CT HEAD WO CONTRAST  Result Date: 12/04/2019 CLINICAL DATA:  64 year old male with continued headache and RIGHT neck pain following fall 4 days ago. Initial encounter. EXAM: CT HEAD WITHOUT CONTRAST CT CERVICAL SPINE WITHOUT CONTRAST TECHNIQUE: Multidetector CT imaging of the head and cervical spine was performed following the standard protocol without intravenous contrast. Multiplanar CT image reconstructions of the cervical spine were also generated. COMPARISON:  None. FINDINGS: CT HEAD FINDINGS Brain: No evidence of acute infarction, hemorrhage, hydrocephalus, extra-axial collection or mass lesion/mass effect. Vascular: No hyperdense vessel or unexpected calcification. Skull: Normal. Negative for fracture or focal lesion. Sinuses/Orbits: No acute finding. Other: None. CT CERVICAL SPINE FINDINGS Alignment: Normal. Skull base and vertebrae: No acute fracture. No primary bone lesion or focal pathologic process. Soft tissues and spinal canal: No prevertebral fluid or swelling. No visible canal hematoma. Disc levels: Anterior fusion at C5-C6-C7 again noted. Multilevel degenerative disc disease, spondylosis and facet arthropathy noted, mild to moderate at C4-5 and C7-T1. Upper chest: No acute abnormality. Other: None IMPRESSION: 1. No  evidence of intracranial abnormality. 2. No static evidence of acute injury to the cervical spine. Fusion and degenerative changes as described. Electronically Signed   By: Margarette Canada M.D.   On: 12/04/2019 15:25   CT CERVICAL SPINE WO CONTRAST  Result Date: 12/04/2019 CLINICAL DATA:  64 year old male with continued headache and RIGHT neck pain following fall 4 days ago. Initial encounter. EXAM: CT HEAD WITHOUT CONTRAST CT CERVICAL SPINE WITHOUT CONTRAST TECHNIQUE: Multidetector CT imaging of the head and cervical spine was performed following the standard protocol without intravenous contrast. Multiplanar CT image reconstructions of the cervical spine were  also generated. COMPARISON:  None. FINDINGS: CT HEAD FINDINGS Brain: No evidence of acute infarction, hemorrhage, hydrocephalus, extra-axial collection or mass lesion/mass effect. Vascular: No hyperdense vessel or unexpected calcification. Skull: Normal. Negative for fracture or focal lesion. Sinuses/Orbits: No acute finding. Other: None. CT CERVICAL SPINE FINDINGS Alignment: Normal. Skull base and vertebrae: No acute fracture. No primary bone lesion or focal pathologic process. Soft tissues and spinal canal: No prevertebral fluid or swelling. No visible canal hematoma. Disc levels: Anterior fusion at C5-C6-C7 again noted. Multilevel degenerative disc disease, spondylosis and facet arthropathy noted, mild to moderate at C4-5 and C7-T1. Upper chest: No acute abnormality. Other: None IMPRESSION: 1. No evidence of intracranial abnormality. 2. No static evidence of acute injury to the cervical spine. Fusion and degenerative changes as described. Electronically Signed   By: Margarette Canada M.D.   On: 12/04/2019 15:25   DG Chest Portable 1 View  Result Date: 12/04/2019 CLINICAL DATA:  Confusion. EXAM: PORTABLE CHEST 1 VIEW COMPARISON:  11/07/2012 and prior radiographs FINDINGS: The cardiomediastinal silhouette is unremarkable. There is no evidence of focal airspace  disease, pulmonary edema, suspicious pulmonary nodule/mass, pleural effusion, or pneumothorax. No acute bony abnormalities are identified. Cervical spine fusion changes again noted. IMPRESSION: No active cardiopulmonary disease. Electronically Signed   By: Margarette Canada M.D.   On: 12/04/2019 16:55        Scheduled Meds: . atorvastatin  40 mg Oral Daily  . gabapentin  300 mg Oral QHS  . heparin  5,000 Units Subcutaneous Q8H  . insulin aspart  0-15 Units Subcutaneous TID WC  . insulin aspart  0-5 Units Subcutaneous QHS  . insulin aspart  4 Units Subcutaneous TID WC  . insulin glargine  20 Units Subcutaneous QHS  . pantoprazole  40 mg Oral Daily  . [START ON 12/06/2019] predniSONE  60 mg Oral Q breakfast   Or  . [START ON 12/06/2019] predniSONE  50 mg Oral Q breakfast   Or  . [START ON 12/06/2019] predniSONE  40 mg Oral Q breakfast   Or  . [START ON 12/06/2019] predniSONE  30 mg Oral Q breakfast   Or  . [START ON 12/06/2019] predniSONE  20 mg Oral Q breakfast   Or  . [START ON 12/06/2019] predniSONE  10 mg Oral Q breakfast  . venlafaxine XR  150 mg Oral Daily   Continuous Infusions: . sodium chloride 100 mL/hr at 12/05/19 1001  . acyclovir 870 mg (12/05/19 1519)     LOS: 0 days    Time spent: over 30 min    Fayrene Helper, MD [facility]   To contact the attending provider between 7A-7P or the covering provider during after hours 7P-7A, please log into the web site [email or web address] and access using universal [place] password for that web site. If you do not have the password, please call the hospital operator.  12/05/2019, 6:41 PM

## 2019-12-06 LAB — COMPREHENSIVE METABOLIC PANEL
ALT: 41 U/L (ref 0–44)
AST: 50 U/L — ABNORMAL HIGH (ref 15–41)
Albumin: 3.8 g/dL (ref 3.5–5.0)
Alkaline Phosphatase: 272 U/L — ABNORMAL HIGH (ref 38–126)
Anion gap: 7 (ref 5–15)
BUN: 19 mg/dL (ref 8–23)
CO2: 24 mmol/L (ref 22–32)
Calcium: 8.4 mg/dL — ABNORMAL LOW (ref 8.9–10.3)
Chloride: 104 mmol/L (ref 98–111)
Creatinine, Ser: 1.18 mg/dL (ref 0.61–1.24)
GFR calc Af Amer: >60 mL/min (ref >60)
GFR calc non Af Amer: >60 mL/min (ref >60)
Glucose, Bld: 107 mg/dL — ABNORMAL HIGH (ref 70–99)
Potassium: 3.4 mmol/L — ABNORMAL LOW (ref 3.5–5.1)
Sodium: 135 mmol/L (ref 135–145)
Total Bilirubin: 1.5 mg/dL — ABNORMAL HIGH (ref 0.3–1.2)
Total Protein: 6.7 g/dL (ref 6.5–8.1)

## 2019-12-06 LAB — CBC WITH DIFFERENTIAL/PLATELET
Abs Immature Granulocytes: 0.02 10*3/uL (ref 0.00–0.07)
Basophils Absolute: 0 10*3/uL (ref 0.0–0.1)
Basophils Relative: 1 %
Eosinophils Absolute: 0.1 10*3/uL (ref 0.0–0.5)
Eosinophils Relative: 3 %
HCT: 35.1 % — ABNORMAL LOW (ref 39.0–52.0)
Hemoglobin: 12.2 g/dL — ABNORMAL LOW (ref 13.0–17.0)
Immature Granulocytes: 0 %
Lymphocytes Relative: 29 %
Lymphs Abs: 1.5 10*3/uL (ref 0.7–4.0)
MCH: 29.3 pg (ref 26.0–34.0)
MCHC: 34.8 g/dL (ref 30.0–36.0)
MCV: 84.2 fL (ref 80.0–100.0)
Monocytes Absolute: 0.4 10*3/uL (ref 0.1–1.0)
Monocytes Relative: 7 %
Neutro Abs: 3.1 10*3/uL (ref 1.7–7.7)
Neutrophils Relative %: 60 %
Platelets: 156 10*3/uL (ref 150–400)
RBC: 4.17 MIL/uL — ABNORMAL LOW (ref 4.22–5.81)
RDW: 12.3 % (ref 11.5–15.5)
WBC: 5.1 10*3/uL (ref 4.0–10.5)
nRBC: 0 % (ref 0.0–0.2)

## 2019-12-06 LAB — GLUCOSE, CAPILLARY
Glucose-Capillary: 85 mg/dL (ref 70–99)
Glucose-Capillary: 182 mg/dL — ABNORMAL HIGH (ref 70–99)
Glucose-Capillary: 359 mg/dL — ABNORMAL HIGH (ref 70–99)
Glucose-Capillary: 353 mg/dL — ABNORMAL HIGH (ref 70–99)

## 2019-12-06 LAB — PHOSPHORUS: Phosphorus: 2.9 mg/dL (ref 2.5–4.6)

## 2019-12-06 LAB — MAGNESIUM: Magnesium: 1.6 mg/dL — ABNORMAL LOW (ref 1.7–2.4)

## 2019-12-06 MED ORDER — DEXTROSE 5 % IV SOLN
10.0000 mg/kg | Freq: Three times a day (TID) | INTRAVENOUS | Status: DC
  Administered 2019-12-06 – 2019-12-07 (×3): 870 mg via INTRAVENOUS
  Filled 2019-12-06 (×7): qty 17.4

## 2019-12-06 MED ORDER — MAGNESIUM SULFATE 2 GM/50ML IV SOLN
2.0000 g | Freq: Once | INTRAVENOUS | Status: AC
  Administered 2019-12-06: 2 g via INTRAVENOUS
  Filled 2019-12-06: qty 50

## 2019-12-06 MED ORDER — PREDNISONE 20 MG PO TABS: 10.0000 mg | ORAL_TABLET | Freq: Every day | ORAL | Status: DC

## 2019-12-06 MED ORDER — PREDNISONE 20 MG PO TABS: 30.0000 mg | ORAL_TABLET | Freq: Every day | ORAL | Status: DC

## 2019-12-06 MED ORDER — PREDNISONE 20 MG PO TABS
60.0000 mg | ORAL_TABLET | Freq: Every day | ORAL | Status: AC
  Administered 2019-12-06 – 2019-12-07 (×2): 60 mg via ORAL
  Filled 2019-12-06: qty 3

## 2019-12-06 MED ORDER — POTASSIUM CHLORIDE CRYS ER 20 MEQ PO TBCR
40.0000 meq | EXTENDED_RELEASE_TABLET | Freq: Once | ORAL | Status: AC
  Administered 2019-12-06: 40 meq via ORAL
  Filled 2019-12-06: qty 2

## 2019-12-06 MED ORDER — INSULIN ASPART 100 UNIT/ML ~~LOC~~ SOLN
0.0000 [IU] | Freq: Three times a day (TID) | SUBCUTANEOUS | Status: DC
  Administered 2019-12-07: 11 [IU] via SUBCUTANEOUS
  Administered 2019-12-07: 7 [IU] via SUBCUTANEOUS
  Filled 2019-12-06 (×2): qty 1

## 2019-12-06 MED ORDER — INSULIN ASPART 100 UNIT/ML ~~LOC~~ SOLN
0.0000 [IU] | Freq: Every day | SUBCUTANEOUS | Status: DC
  Administered 2019-12-06: 5 [IU] via SUBCUTANEOUS
  Filled 2019-12-06: qty 1

## 2019-12-06 MED ORDER — PREDNISONE 20 MG PO TABS: 40.0000 mg | ORAL_TABLET | Freq: Every day | ORAL | Status: DC

## 2019-12-06 MED ORDER — PREDNISONE 50 MG PO TABS: 50.0000 mg | ORAL_TABLET | Freq: Every day | ORAL | Status: DC

## 2019-12-06 MED ORDER — PREDNISONE 20 MG PO TABS: 20.0000 mg | ORAL_TABLET | Freq: Every day | ORAL | Status: DC

## 2019-12-06 MED ORDER — INSULIN ASPART 100 UNIT/ML ~~LOC~~ SOLN
6.0000 [IU] | Freq: Three times a day (TID) | SUBCUTANEOUS | Status: DC
  Administered 2019-12-07 (×3): 6 [IU] via SUBCUTANEOUS
  Filled 2019-12-06 (×3): qty 1

## 2019-12-06 MED ORDER — INSULIN GLARGINE 100 UNIT/ML ~~LOC~~ SOLN
35.0000 [IU] | Freq: Every day | SUBCUTANEOUS | Status: DC
  Administered 2019-12-06: 35 [IU] via SUBCUTANEOUS
  Filled 2019-12-06 (×2): qty 0.35

## 2019-12-06 MED ORDER — DIPHENHYDRAMINE HCL 12.5 MG/5ML PO ELIX
12.5000 mg | ORAL_SOLUTION | Freq: Every evening | ORAL | Status: DC | PRN
  Filled 2019-12-06 (×2): qty 5

## 2019-12-06 MED ORDER — MORPHINE SULFATE (PF) 2 MG/ML IV SOLN
1.0000 mg | INTRAVENOUS | Status: DC | PRN
  Administered 2019-12-06: 1 mg via INTRAVENOUS
  Filled 2019-12-06: qty 1

## 2019-12-06 NOTE — Progress Notes (Signed)
PROGRESS NOTE    [name]  JHE:[id number] DOB: Jan 28, 1956 DOA: 12/04/2019 PCP: Theotis Burrow, MD   Brief Narrative: [name] is [name] 64 y.o. male with medical history significant of HTN, DM2, OSA, PTSD, h/o TBI with coma, h/o UC and colon cancer s/p colectomy, presented to ED with altered mental status for 3 days.   Per ED report, pt had confusion, slow speech and difficulty processing word for the past 3 days. As I interviewed pt, he was alert and oriented without confusion or speech problem. He states that he is easy to have diarrhea since his colectomy. He started to have diarrhea 4 to 5 days ago, nonbloody, as well as several episodes of non bloody vomiting. He takes Imodium as needed. He denies abdominal pain. 3 days ago, when he was bending down to pick up his granddaughter, he fell to his right side and hit his head on the door frame. Since then he developed some rash at the back side of the neck on the right side some on his shoulder. He feels burning pain on the side of the rash. He denies fever, chills, cough, chest pain, dysuria, difficulty swallowing, or focal weakness.  Assessment & Plan:   Principal Problem:   Dehydration Active Problems:   Essential hypertension   Panic attack   Obstructive sleep apnea   Post traumatic stress disorder (PTSD)   Status post intestinal bypass or anastomosis   TBI (traumatic brain injury) ([place])   Uncontrolled type 2 diabetes mellitus ([place])   Hyponatremia   Diarrhea   Shingles  Disseminated Zoster: started after episode of trauma after fall. Involving posterior neck and upper back and chest.  Didier Brandenburg&Ox3 without facial paralysis, involvement of auricle or auditory canal.  No change in taste or hearing.  There was report of confusion, though he was not confused on my partners evaluation (and on discussion with step son, this was not described either).  No evidence of complicated zoster at this time. Continue acyclovir IV - adjusted  for renal function Will start gabapentin and steroids given discomfort Plan for 10-14 days antiviral therapy, discussed with ID  AKI: baseline of 1.24 in 2020. 2.14 on presentation.  2/2 dehydration with vomiting and diarrhea. Improved with IVF  Hyponatremia: 2/2 hyperglycemia and dehydration.  Improved.  Continue IVF.  Hyperkalemia: improved  S/p fall and Altered mental status AMS appears resolved now head and cervical spine CT no acute abnormalities Fall seemed mechanical, related to playing with granddaughter  Essential hypertension Hold lisinopril due to AKI and hyperkalemia Monitor BP and adjust med    DM2 Home takes Lantus 35 units at bedtime and Metformin cw lantus and adjust dose Hold metformin due to AKI  HLD Statin  PTSD and panic attack Continue Effexor and as needed Valium  Hx TBI  Headache: negative head imaging.  He notes with previous head trauma, he similarly has HA after.  Continue to monitor.  DVT prophylaxis: heparin Code Status: full Family Communication: none at bedside - stepson updated Disposition Plan:  . Patient came from: home            . Anticipated d/c place: home . Barriers to d/c OR conditions which need to be met to effect Habib Kise safe d/c: pending improvement in pain   Consultants:   none  Procedures:   none  Antimicrobials: Anti-infectives (From admission, onward)   Start     Dose/Rate Route Frequency Ordered Stop   12/06/19 1400  acyclovir (ZOVIRAX) 870 mg in  dextrose 5 % 150 mL IVPB     10 mg/kg  87.2 kg 167.4 mL/hr over 60 Minutes Intravenous Every 8 hours 12/06/19 0958     12/05/19 1400  acyclovir (ZOVIRAX) 870 mg in dextrose 5 % 150 mL IVPB  Status:  Discontinued     10 mg/kg  87.2 kg 167.4 mL/hr over 60 Minutes Intravenous Every 12 hours 12/05/19 0622 12/06/19 0958   12/05/19 0615  acyclovir (ZOVIRAX) 870 mg in dextrose 5 % 150 mL IVPB  Status:  Discontinued     10 mg/kg  87.2 kg 167.4 mL/hr over 60 Minutes  Intravenous Every 8 hours 12/05/19 0613 12/05/19 0622   12/04/19 1800  acyclovir (ZOVIRAX) 200 MG capsule 800 mg  Status:  Discontinued     800 mg Oral 5 times daily 12/04/19 1643 12/05/19 0613         Subjective: Still with HA and burning pain to neck  Objective: Vitals:   12/05/19 1715 12/05/19 2134 12/06/19 0504 12/06/19 1207  BP:  118/76 135/78 124/71  Pulse:  94 87 79  Resp: 18 18 16 16   Temp:  98.5 F (36.9 C) 98.1 F (36.7 C) 98.1 F (36.7 C)  TempSrc:  Oral Oral Oral  SpO2:  99% 100% 97%  Weight:      Height:        Intake/Output Summary (Last 24 hours) at 12/06/2019 1629 Last data filed at 12/06/2019 1000 Gross per 24 hour  Intake 2023.46 ml  Output --  Net 2023.46 ml   Filed Weights   12/04/19 1404 12/05/19 0007  Weight: 89.8 kg 87.2 kg    Examination:  General: No acute distress. Cardiovascular: Heart sounds show Albertine Lafoy regular rate, and rhythm.  Lungs: Clear to auscultation bilaterally  Abdomen: Soft, nontender, nondistended Neurological: Alert and oriented 3. Moves all extremities 4. Cranial nerves II through XII grossly intact. Skin: erythematous rash to back of neck and R chest, back with vesicles in various states of healing Extremities: No clubbing or cyanosis. No edema.     Data Reviewed: I have personally reviewed following labs and imaging studies  CBC: Recent Labs  Lab 12/04/19 1421 12/05/19 0355 12/06/19 0911  WBC 7.1 5.0 5.1  NEUTROABS 5.9  --  3.1  HGB 14.2 12.2* 12.2*  HCT 38.8* 34.2* 35.1*  MCV 80.5 81.4 84.2  PLT 142* 142* 767   Basic Metabolic Panel: Recent Labs  Lab 12/04/19 1421 12/05/19 0355 12/06/19 0911  NA 121* 132* 135  K 5.2* 4.3 3.4*  CL 94* 100 104  CO2 15* 24 24  GLUCOSE 457* 246* 107*  BUN 38* 32* 19  CREATININE 2.14* 1.80* 1.18  CALCIUM 8.5* 8.4* 8.4*  MG  --   --  1.6*  PHOS  --   --  2.9   GFR: Estimated Creatinine Clearance: 65.3 mL/min (by C-G formula based on SCr of 1.18 mg/dL). Liver  Function Tests: Recent Labs  Lab 12/04/19 1421 12/06/19 0911  AST 29 50*  ALT 30 41  ALKPHOS 222* 272*  BILITOT 1.3* 1.5*  PROT 7.1 6.7  ALBUMIN 4.0 3.8   No results for input(s): LIPASE, AMYLASE in the last 168 hours. No results for input(s): AMMONIA in the last 168 hours. Coagulation Profile: No results for input(s): INR, PROTIME in the last 168 hours. Cardiac Enzymes: No results for input(s): CKTOTAL, CKMB, CKMBINDEX, TROPONINI in the last 168 hours. BNP (last 3 results) No results for input(s): PROBNP in the last 8760 hours. HbA1C: Recent  Labs    12/05/19 0355  HGBA1C 9.8*   CBG: Recent Labs  Lab 12/05/19 1146 12/05/19 1654 12/05/19 2131 12/06/19 0807 12/06/19 1204  GLUCAP 169* 177* 264* 85 182*   Lipid Profile: Recent Labs    12/05/19 0355  CHOL 86  HDL 39*  LDLCALC 33  TRIG 72  CHOLHDL 2.2   Thyroid Function Tests: No results for input(s): TSH, T4TOTAL, FREET4, T3FREE, THYROIDAB in the last 72 hours. Anemia Panel: No results for input(s): VITAMINB12, FOLATE, FERRITIN, TIBC, IRON, RETICCTPCT in the last 72 hours. Sepsis Labs: No results for input(s): PROCALCITON, LATICACIDVEN in the last 168 hours.  Recent Results (from the past 240 hour(s))  SARS CORONAVIRUS 2 (TAT 6-24 HRS) Nasopharyngeal Nasopharyngeal Swab     Status: None   Collection Time: 12/04/19  4:20 PM   Specimen: Nasopharyngeal Swab  Result Value Ref Range Status   SARS Coronavirus 2 NEGATIVE NEGATIVE Final    Comment: (NOTE) SARS-CoV-2 target nucleic acids are NOT DETECTED. The SARS-CoV-2 RNA is generally detectable in upper and lower respiratory specimens during the acute phase of infection. Negative results do not preclude SARS-CoV-2 infection, do not rule out co-infections with other pathogens, and should not be used as the sole basis for treatment or other patient management decisions. Negative results must be combined with clinical observations, patient history, and  epidemiological information. The expected result is Negative. Fact Sheet for Patients: SugarRoll.be Fact Sheet for Healthcare Providers: https://www.woods-mathews.com/ This test is not yet approved or cleared by the [place] FDA and  has been authorized for detection and/or diagnosis of SARS-CoV-2 by FDA under an Emergency Use Authorization (EUA). This EUA will remain  in effect (meaning this test can be used) for the duration of the COVID-19 declaration under Section 56 4(b)(1) of the Act, 21 U.S.C. section 360bbb-3(b)(1), unless the authorization is terminated or revoked sooner. Performed at [facility], [place] [address], [place], [place] [address]   C Difficile Quick Screen w PCR reflex     Status: None   Collection Time: 12/05/19  5:00 AM   Specimen: Stool  Result Value Ref Range Status   C Diff antigen NEGATIVE NEGATIVE Final   C Diff toxin NEGATIVE NEGATIVE Final   C Diff interpretation No C. difficile detected.  Final    Comment: Performed at [facility], [address], [place], [place] [address]         Radiology Studies: DG Chest Portable 1 View  Result Date: 12/04/2019 CLINICAL DATA:  Confusion. EXAM: PORTABLE CHEST 1 VIEW COMPARISON:  11/07/2012 and prior radiographs FINDINGS: The cardiomediastinal silhouette is unremarkable. There is no evidence of focal airspace disease, pulmonary edema, suspicious pulmonary nodule/mass, pleural effusion, or pneumothorax. No acute bony abnormalities are identified. Cervical spine fusion changes again noted. IMPRESSION: No active cardiopulmonary disease. Electronically Signed   By: Margarette Canada M.D.   On: 12/04/2019 16:55        Scheduled Meds: . atorvastatin  40 mg Oral Daily  . gabapentin  300 mg Oral QHS  . heparin  5,000 Units Subcutaneous Q8H  . insulin aspart  0-15 Units Subcutaneous TID WC  . insulin aspart  0-5 Units Subcutaneous QHS  . insulin aspart  4  Units Subcutaneous TID WC  . insulin glargine  27 Units Subcutaneous QHS  . pantoprazole  40 mg Oral Daily  . predniSONE  60 mg Oral Q breakfast   Followed by  . [START ON 12/08/2019] predniSONE  50 mg Oral  Q breakfast   Followed by  . [START ON 12/10/2019] predniSONE  40 mg Oral Q breakfast   Followed by  . [START ON 12/12/2019] predniSONE  30 mg Oral Q breakfast   Followed by  . [START ON 12/14/2019] predniSONE  20 mg Oral Q breakfast   Followed by  . [START ON 12/16/2019] predniSONE  10 mg Oral Q breakfast  . venlafaxine XR  150 mg Oral Daily   Continuous Infusions: . sodium chloride 100 mL/hr at 12/06/19 1026  . acyclovir 870 mg (12/06/19 1335)     LOS: 1 day    Time spent: over 30 min    Fayrene Helper, MD [facility]   To contact the attending provider between 7A-7P or the covering provider during after hours 7P-7A, please log into the web site [email or web address] and access using universal [place] password for that web site. If you do not have the password, please call the hospital operator.  12/06/2019, 4:29 PM

## 2019-12-06 NOTE — Progress Notes (Signed)
Order received from Dr Florene Glen to discontinue telemetry

## 2019-12-06 NOTE — Progress Notes (Signed)
PHARMACY NOTE:  ANTIVIRAL RENAL DOSAGE ADJUSTMENT  Current antimicrobial regimen includes a mismatch between antiviral dosage and estimated renal function.  As per policy approved by the [facility] will be adjusted accordingly.  Current antiviral dosage:  acyclovir 10 mg/kg IV every 12 hours  Indication: disseminated Zoster  Renal Function:  Estimated Creatinine Clearance: 65.3 mL/min (by C-G formula based on SCr of 1.18 mg/dL). []      On intermittent HD, scheduled: []      On CRRT    Antimviral dosage has been changed to:  acyclovir 10 mg/kg IV every 8 hours  Thank you for allowing pharmacy to be a part of this patient's care.  Dallie Piles, [facility] 12/06/2019 9:55 AM

## 2019-12-07 LAB — CBC WITH DIFFERENTIAL/PLATELET
Abs Immature Granulocytes: 0.02 10*3/uL (ref 0.00–0.07)
Basophils Absolute: 0 10*3/uL (ref 0.0–0.1)
Basophils Relative: 0 %
Eosinophils Absolute: 0 10*3/uL (ref 0.0–0.5)
Eosinophils Relative: 1 %
HCT: 29.1 % — ABNORMAL LOW (ref 39.0–52.0)
Hemoglobin: 10.2 g/dL — ABNORMAL LOW (ref 13.0–17.0)
Immature Granulocytes: 0 %
Lymphocytes Relative: 20 %
Lymphs Abs: 1.3 10*3/uL (ref 0.7–4.0)
MCH: 29.1 pg (ref 26.0–34.0)
MCHC: 35.1 g/dL (ref 30.0–36.0)
MCV: 82.9 fL (ref 80.0–100.0)
Monocytes Absolute: 0.4 10*3/uL (ref 0.1–1.0)
Monocytes Relative: 7 %
Neutro Abs: 4.6 10*3/uL (ref 1.7–7.7)
Neutrophils Relative %: 72 %
Platelets: 130 10*3/uL — ABNORMAL LOW (ref 150–400)
RBC: 3.51 MIL/uL — ABNORMAL LOW (ref 4.22–5.81)
RDW: 12.2 % (ref 11.5–15.5)
WBC: 6.4 10*3/uL (ref 4.0–10.5)
nRBC: 0 % (ref 0.0–0.2)

## 2019-12-07 LAB — COMPREHENSIVE METABOLIC PANEL
ALT: 69 U/L — ABNORMAL HIGH (ref 0–44)
AST: 66 U/L — ABNORMAL HIGH (ref 15–41)
Albumin: 3.1 g/dL — ABNORMAL LOW (ref 3.5–5.0)
Alkaline Phosphatase: 277 U/L — ABNORMAL HIGH (ref 38–126)
Anion gap: 8 (ref 5–15)
BUN: 18 mg/dL (ref 8–23)
CO2: 22 mmol/L (ref 22–32)
Calcium: 7.9 mg/dL — ABNORMAL LOW (ref 8.9–10.3)
Chloride: 102 mmol/L (ref 98–111)
Creatinine, Ser: 1.19 mg/dL (ref 0.61–1.24)
GFR calc Af Amer: >60 mL/min (ref >60)
GFR calc non Af Amer: >60 mL/min (ref >60)
Glucose, Bld: 328 mg/dL — ABNORMAL HIGH (ref 70–99)
Potassium: 4.3 mmol/L (ref 3.5–5.1)
Sodium: 132 mmol/L — ABNORMAL LOW (ref 135–145)
Total Bilirubin: 0.9 mg/dL (ref 0.3–1.2)
Total Protein: 5.6 g/dL — ABNORMAL LOW (ref 6.5–8.1)

## 2019-12-07 LAB — GLUCOSE, CAPILLARY
Glucose-Capillary: 281 mg/dL — ABNORMAL HIGH (ref 70–99)
Glucose-Capillary: 99 mg/dL (ref 70–99)
Glucose-Capillary: 249 mg/dL — ABNORMAL HIGH (ref 70–99)

## 2019-12-07 LAB — MAGNESIUM: Magnesium: 1.9 mg/dL (ref 1.7–2.4)

## 2019-12-07 LAB — PHOSPHORUS: Phosphorus: 2.1 mg/dL — ABNORMAL LOW (ref 2.5–4.6)

## 2019-12-07 MED ORDER — OXYCODONE HCL 5 MG PO TABS: 5.0000 mg | ORAL_TABLET | Freq: Four times a day (QID) | ORAL | 0 refills | Status: AC | PRN

## 2019-12-07 MED ORDER — LIVING WELL WITH DIABETES BOOK
Freq: Once | Status: AC
  Filled 2019-12-07: qty 1

## 2019-12-07 MED ORDER — GABAPENTIN 600 MG PO TABS: 300.0000 mg | ORAL_TABLET | Freq: Every day | ORAL | 0 refills | Status: DC

## 2019-12-07 MED ORDER — VALACYCLOVIR HCL 1 G PO TABS: 1000.0000 mg | ORAL_TABLET | Freq: Three times a day (TID) | ORAL | 0 refills | Status: AC

## 2019-12-07 NOTE — Discharge Summary (Signed)
Physician Discharge Summary  [name] JSE:[id number] DOB: 05/31/1956 DOA: 12/04/2019  PCP: Theotis Burrow, MD  Admit date: 12/04/2019 Discharge date: 12/09/2019  Time spent: 40 minutes  Recommendations for Outpatient Follow-up:  1. Follow outpatient CBC/CMP 2. Complete 10 day course of valtrex 3. Started on gabapentin for nerve pain  4. Follow LFT's outpatient  Discharge Diagnoses:  Principal Problem:   Dehydration Active Problems:   Essential hypertension   Panic attack   Obstructive sleep apnea   Post traumatic stress disorder (PTSD)   Status post intestinal bypass or anastomosis   TBI (traumatic brain injury) ([name])   Uncontrolled type 2 diabetes mellitus ([place])   Hyponatremia   Diarrhea   Shingles   Discharge Condition: stable  Filed Weights   12/04/19 1404 12/05/19 0007  Weight: 89.8 kg 87.2 kg    History of present illness:  [name] 64 y.o.malewith medical history significant ofHTN, DM2, OSA, PTSD, h/o TBI with coma, h/o UC and colon cancer s/p colectomy, presented to ED with altered mental status for 3 days.   Per ED report, pt had confusion, slow speech and difficulty processing word for the past 3 days. As I interviewed pt, he was alert and oriented without confusion or speech problem. He states that he is easy to have diarrhea since his colectomy. He started to havediarrhea 4 to 5 days ago,nonbloody,as well as several episodes of non bloody vomiting. He takes Imodium as needed. He denies abdominal pain. 3 days ago, when he was bending down to pick up his granddaughter, he fell to his right side and hit his head on the door frame. Since then he developed some rash at the back side of the neck on the right side some on his shoulder. He feels burning pain on the side of the rash. He deniesfever, chills, cough, chest pain,dysuria,difficulty swallowing, or focal weakness.  He was admitted with disseminated zoster, nausea/vomiting/diarrhea,  and AKI.  He improved with IVF and IV acyclovir.  Hospitalization c/b pain from shingles and recent trauma from fall. Discharged on 3/15 with improved symptoms.  See below for additional details  Hospital Course:  Disseminated Zoster: started after episode of trauma after fall. Involving posterior neck and upper back and chest.  [name]&Ox3 without facial paralysis, involvement of auricle or auditory canal.  No change in taste or hearing.  There was report of confusion, though he was not confused on my partners evaluation (and on discussion with step son, this was not described either - he also notes that this his cognitive issues are more chronic).  No evidence of complicated zoster at this time. Continue acyclovir IV -> valtrex x 10 days on discharge  D/c steroids given significant hyperglycemia Discharge with gabapentin Plan for 10-14 days antiviral therapy, discussed with ID  AKI: baseline of 1.24 in 2020. 2.14 on presentation.  2/2 dehydration with vomiting and diarrhea. Improved with IVF  Hyponatremia: 2/2 hyperglycemia and dehydration.  Improved.  Continue IVF.  Hyperkalemia: improved  S/p fall and Altered mental status AMS appears resolved now - he describes chronic cognitive decline over months headand cervical spineCTno acute abnormalities Fall seemed mechanical, related to playing with granddaughter  Essential hypertension Resume home meds  DM2 Home takes Lantus 35 units at bedtime and Metformin Hyperglycemia with steroids, will discontinue steroids  HLD Statin  PTSD and panic attack Continue Effexor and as needed Valium  Hx TBI  Headache: negative head imaging.  He notes with previous head trauma, he similarly has HA after.  Continue to monitor.  Elevated LFTs: follow outpatient  Procedures:  none  Consultations:  none  Discharge Exam: Vitals:   12/07/19 1644 12/07/19 1644  BP: 116/62 116/62  Pulse: 73 72  Resp: 18 18  Temp: 97.8 F (36.6 C)  97.8 F (36.6 C)  SpO2: 100% 100%   Feeling better comfortabe with discharge Discussed d/c plan and recs  General: No acute distress. Cardiovascular: Heart sounds show [name] regular rate, and rhythm. ] Lungs: Clear to auscultation bilaterally  Abdomen: Soft, nontender, nondistended. Neurological: Alert and oriented 3. Moves all extremities 4 with equal strength. Cranial nerves II through XII grossly intact. Skin: rash to back of neck and R chest/back Extremities: No clubbing or cyanosis. No edema  Discharge Instructions   Discharge Instructions    Call MD for:  difficulty breathing, headache or visual disturbances   Complete by: As directed    Call MD for:  extreme fatigue   Complete by: As directed    Call MD for:  hives   Complete by: As directed    Call MD for:  persistant dizziness or light-headedness   Complete by: As directed    Call MD for:  persistant nausea and vomiting   Complete by: As directed    Call MD for:  redness, tenderness, or signs of infection (pain, swelling, redness, odor or green/yellow discharge around incision site)   Complete by: As directed    Call MD for:  severe uncontrolled pain   Complete by: As directed    Call MD for:  temperature >100.4   Complete by: As directed    Diet - low sodium heart healthy   Complete by: As directed    Discharge instructions   Complete by: As directed    You were seen for shingles and dehydration.  You've improved on acyclovir.  We will send you home on valtrex to take for another 10 days.  We'll stop your steroids due to the high blood sugars.  We'll start you on gabapentin, which will help with the burning nerve pain.  I'll prescribe [name] couple of days of oxycodone as well.  Please follow with your outpatient PCP.    Return for new, recurrent, or worsening symptoms.  Please ask your PCP to request records from this hospitalization so they know what was done and what the next steps will be.   Increase activity  slowly   Complete by: As directed      Allergies as of 12/07/2019      Reactions   Sulfa Antibiotics Rash      Medication List    TAKE these medications   atorvastatin 40 MG tablet Commonly known as: LIPITOR Take 40 mg by mouth daily.   diazepam 5 MG tablet Commonly known as: VALIUM Take 5 mg by mouth 2 (two) times daily as needed for anxiety.   diphenoxylate-atropine 2.5-0.025 MG tablet Commonly known as: LOMOTIL Take 1 tablet by mouth 4 (four) times daily as needed for diarrhea or loose stools.   gabapentin 600 MG tablet Commonly known as: NEURONTIN Take 0.5 tablets (300 mg total) by mouth at bedtime. Follow with your PCP regarding dose adjustment   Lantus SoloStar 100 UNIT/ML Solostar Pen Generic drug: insulin glargine Inject 35 Units into the skin at bedtime.   lisinopril 5 MG tablet Commonly known as: ZESTRIL Take 5 mg by mouth daily.   metFORMIN 1000 MG tablet Commonly known as: GLUCOPHAGE Take 1,000 mg by mouth 2 (two) times daily.  omeprazole 40 MG capsule Commonly known as: PRILOSEC Take 40 mg by mouth daily.   oxyCODONE 5 MG immediate release tablet Commonly known as: Oxy IR/ROXICODONE Take 1 tablet (5 mg total) by mouth every 6 (six) hours as needed for up to 3 days for moderate pain or severe pain.   valACYclovir 1000 MG tablet Commonly known as: Valtrex Take 1 tablet (1,000 mg total) by mouth 3 (three) times daily for 10 days.   venlafaxine XR 150 MG 24 hr capsule Commonly known as: EFFEXOR-XR Take 150 mg by mouth daily.      Allergies  Allergen Reactions  . Sulfa Antibiotics Rash   Follow-up Information    Revelo, Elyse Jarvis, MD. Go on 12/23/2019.   Specialty: Family Medicine Why: 3:40pm Contact information: [address] [place] [address] [phone]            The results of significant diagnostics from this hospitalization (including imaging, microbiology, ancillary and laboratory) are listed below for  reference.    Significant Diagnostic Studies: CT HEAD WO CONTRAST  Result Date: 12/04/2019 CLINICAL DATA:  64 year old male with continued headache and RIGHT neck pain following fall 4 days ago. Initial encounter. EXAM: CT HEAD WITHOUT CONTRAST CT CERVICAL SPINE WITHOUT CONTRAST TECHNIQUE: Multidetector CT imaging of the head and cervical spine was performed following the standard protocol without intravenous contrast. Multiplanar CT image reconstructions of the cervical spine were also generated. COMPARISON:  None. FINDINGS: CT HEAD FINDINGS Brain: No evidence of acute infarction, hemorrhage, hydrocephalus, extra-axial collection or mass lesion/mass effect. Vascular: No hyperdense vessel or unexpected calcification. Skull: Normal. Negative for fracture or focal lesion. Sinuses/Orbits: No acute finding. Other: None. CT CERVICAL SPINE FINDINGS Alignment: Normal. Skull base and vertebrae: No acute fracture. No primary bone lesion or focal pathologic process. Soft tissues and spinal canal: No prevertebral fluid or swelling. No visible canal hematoma. Disc levels: Anterior fusion at C5-C6-C7 again noted. Multilevel degenerative disc disease, spondylosis and facet arthropathy noted, mild to moderate at C4-5 and C7-T1. Upper chest: No acute abnormality. Other: None IMPRESSION: 1. No evidence of intracranial abnormality. 2. No static evidence of acute injury to the cervical spine. Fusion and degenerative changes as described. Electronically Signed   By: Margarette Canada M.D.   On: 12/04/2019 15:25   CT CERVICAL SPINE WO CONTRAST  Result Date: 12/04/2019 CLINICAL DATA:  64 year old male with continued headache and RIGHT neck pain following fall 4 days ago. Initial encounter. EXAM: CT HEAD WITHOUT CONTRAST CT CERVICAL SPINE WITHOUT CONTRAST TECHNIQUE: Multidetector CT imaging of the head and cervical spine was performed following the standard protocol without intravenous contrast. Multiplanar CT image reconstructions of  the cervical spine were also generated. COMPARISON:  None. FINDINGS: CT HEAD FINDINGS Brain: No evidence of acute infarction, hemorrhage, hydrocephalus, extra-axial collection or mass lesion/mass effect. Vascular: No hyperdense vessel or unexpected calcification. Skull: Normal. Negative for fracture or focal lesion. Sinuses/Orbits: No acute finding. Other: None. CT CERVICAL SPINE FINDINGS Alignment: Normal. Skull base and vertebrae: No acute fracture. No primary bone lesion or focal pathologic process. Soft tissues and spinal canal: No prevertebral fluid or swelling. No visible canal hematoma. Disc levels: Anterior fusion at C5-C6-C7 again noted. Multilevel degenerative disc disease, spondylosis and facet arthropathy noted, mild to moderate at C4-5 and C7-T1. Upper chest: No acute abnormality. Other: None IMPRESSION: 1. No evidence of intracranial abnormality. 2. No static evidence of acute injury to the cervical spine. Fusion and degenerative changes as described. Electronically Signed   By:  Margarette Canada M.D.   On: 12/04/2019 15:25   DG Chest Portable 1 View  Result Date: 12/04/2019 CLINICAL DATA:  Confusion. EXAM: PORTABLE CHEST 1 VIEW COMPARISON:  11/07/2012 and prior radiographs FINDINGS: The cardiomediastinal silhouette is unremarkable. There is no evidence of focal airspace disease, pulmonary edema, suspicious pulmonary nodule/mass, pleural effusion, or pneumothorax. No acute bony abnormalities are identified. Cervical spine fusion changes again noted. IMPRESSION: No active cardiopulmonary disease. Electronically Signed   By: Margarette Canada M.D.   On: 12/04/2019 16:55    Microbiology: Recent Results (from the past 240 hour(s))  SARS CORONAVIRUS 2 (TAT 6-24 HRS) Nasopharyngeal Nasopharyngeal Swab     Status: None   Collection Time: 12/04/19  4:20 PM   Specimen: Nasopharyngeal Swab  Result Value Ref Range Status   SARS Coronavirus 2 NEGATIVE NEGATIVE Final    Comment: (NOTE) SARS-CoV-2 target nucleic  acids are NOT DETECTED. The SARS-CoV-2 RNA is generally detectable in upper and lower respiratory specimens during the acute phase of infection. Negative results do not preclude SARS-CoV-2 infection, do not rule out co-infections with other pathogens, and should not be used as the sole basis for treatment or other patient management decisions. Negative results must be combined with clinical observations, patient history, and epidemiological information. The expected result is Negative. Fact Sheet for Patients: SugarRoll.be Fact Sheet for Healthcare Providers: https://www.woods-mathews.com/ This test is not yet approved or cleared by the [place] FDA and  has been authorized for detection and/or diagnosis of SARS-CoV-2 by FDA under an Emergency Use Authorization (EUA). This EUA will remain  in effect (meaning this test can be used) for the duration of the COVID-19 declaration under Section 56 4(b)(1) of the Act, 21 U.S.C. section 360bbb-3(b)(1), unless the authorization is terminated or revoked sooner. Performed at [facility], [place] [address] [place], [place] [address]   C Difficile Quick Screen w PCR reflex     Status: None   Collection Time: 12/05/19  5:00 AM   Specimen: Stool  Result Value Ref Range Status   C Diff antigen NEGATIVE NEGATIVE Final   C Diff toxin NEGATIVE NEGATIVE Final   C Diff interpretation No C. difficile detected.  Final    Comment: Performed at [facility], [address] [place], [place] [address]     Labs: Basic Metabolic Panel: Recent Labs  Lab 12/04/19 1421 12/05/19 0355 12/06/19 0911 12/07/19 0751  NA 121* 132* 135 132*  K 5.2* 4.3 3.4* 4.3  CL 94* 100 104 102  CO2 15* 24 24 22   GLUCOSE 457* 246* 107* 328*  BUN 38* 32* 19 18  CREATININE 2.14* 1.80* 1.18 1.19  CALCIUM 8.5* 8.4* 8.4* 7.9*  MG  --   --  1.6* 1.9  PHOS  --   --  2.9 2.1*   Liver Function Tests: Recent Labs   Lab 12/04/19 1421 12/06/19 0911 12/07/19 0751  AST 29 50* 66*  ALT 30 41 69*  ALKPHOS 222* 272* 277*  BILITOT 1.3* 1.5* 0.9  PROT 7.1 6.7 5.6*  ALBUMIN 4.0 3.8 3.1*   No results for input(s): LIPASE, AMYLASE in the last 168 hours. No results for input(s): AMMONIA in the last 168 hours. CBC: Recent Labs  Lab 12/04/19 1421 12/05/19 0355 12/06/19 0911 12/07/19 0751  WBC 7.1 5.0 5.1 6.4  NEUTROABS 5.9  --  3.1 4.6  HGB 14.2 12.2* 12.2* 10.2*  HCT 38.8* 34.2* 35.1* 29.1*  MCV 80.5 81.4 84.2 82.9  PLT 142* 142*  156 130*   Cardiac Enzymes: No results for input(s): CKTOTAL, CKMB, CKMBINDEX, TROPONINI in the last 168 hours. BNP: BNP (last 3 results) No results for input(s): BNP in the last 8760 hours.  ProBNP (last 3 results) No results for input(s): PROBNP in the last 8760 hours.  CBG: Recent Labs  Lab 12/06/19 1743 12/06/19 2201 12/07/19 0831 12/07/19 1204 12/07/19 1641  GLUCAP 359* 353* 281* 99 249*       Signed:  Fayrene Helper MD.  [facility] 12/09/2019, 1:45 AM

## 2019-12-07 NOTE — Progress Notes (Signed)
[name] A and O x4. VSS. Pt tolerating diet well. No complaints of nausea or vomiting. IV removed intact, prescriptions given. Pt voices understanding of discharge instructions with no further questions. Patient walked out once wife arrived.  Allergies as of 12/07/2019      Reactions   Sulfa Antibiotics Rash      Medication List    TAKE these medications   atorvastatin 40 MG tablet Commonly known as: LIPITOR Take 40 mg by mouth daily.   diazepam 5 MG tablet Commonly known as: VALIUM Take 5 mg by mouth 2 (two) times daily as needed for anxiety.   diphenoxylate-atropine 2.5-0.025 MG tablet Commonly known as: LOMOTIL Take 1 tablet by mouth 4 (four) times daily as needed for diarrhea or loose stools.   gabapentin 600 MG tablet Commonly known as: NEURONTIN Take 0.5 tablets (300 mg total) by mouth at bedtime. Follow with your PCP regarding dose adjustment   Lantus SoloStar 100 UNIT/ML Solostar Pen Generic drug: insulin glargine Inject 35 Units into the skin at bedtime.   lisinopril 5 MG tablet Commonly known as: ZESTRIL Take 5 mg by mouth daily.   metFORMIN 1000 MG tablet Commonly known as: GLUCOPHAGE Take 1,000 mg by mouth 2 (two) times daily.   omeprazole 40 MG capsule Commonly known as: PRILOSEC Take 40 mg by mouth daily.   oxyCODONE 5 MG immediate release tablet Commonly known as: Oxy IR/ROXICODONE Take 1 tablet (5 mg total) by mouth every 6 (six) hours as needed for up to 3 days for moderate pain or severe pain.   valACYclovir 1000 MG tablet Commonly known as: Valtrex Take 1 tablet (1,000 mg total) by mouth 3 (three) times daily for 10 days.   venlafaxine XR 150 MG 24 hr capsule Commonly known as: EFFEXOR-XR Take 150 mg by mouth daily.       Vitals:   12/07/19 1644 12/07/19 1644  BP: 116/62 116/62  Pulse: 73 72  Resp: 18 18  Temp: 97.8 F (36.6 C) 97.8 F (36.6 C)  SpO2: 100% 100%    [name]

## 2019-12-07 NOTE — Progress Notes (Signed)
Physical Therapy Evaluation Patient Details Name: [name] MRN: [id number] DOB: [birth year] Today's Date: 12/07/2019   History of Present Illness  [name] is a 64 y.o. male with medical history significant of HTN, DM2, OSA, PTSD, h/o TBI with coma, h/o UC and colon cancer s/p colectomy, presented to ED with altered mental status for 3 days.   Clinical Impression  Patient agrees to PT Eval. He has mild strength deficits BLE 3+/5 hip and 4/5 knee with reports of right knee pain that is chronic. He is MI for bed mobility, and MI for transfers sit to stand. He ambulates without AD I for short distances in room due to shingles precautions. He has good sitting balance and good static standing balance. He is aware of dynamic standing deficits that are chronic from past medical history and past impairments that are baseline and not new. He doesn't have any skilled PT needs at this time and will be discharged from PT.     Follow Up Recommendations No PT follow up    Equipment Recommendations       Recommendations for Other Services       Precautions / Restrictions Precautions Precautions: Fall Restrictions Weight Bearing Restrictions: No      Mobility  Bed Mobility Overal bed mobility: Modified Independent                Transfers Overall transfer level: Modified independent Equipment used: None                Ambulation/Gait Ambulation/Gait assistance: Modified independent (Device/Increase time)   Assistive device: None Gait Pattern/deviations: Step-to pattern        Stairs            Wheelchair Mobility    Modified Rankin (Stroke Patients Only)       Balance Overall balance assessment: Mild deficits observed, not formally tested                                           Pertinent Vitals/Pain Pain Assessment: No/denies pain    Home Living Family/patient expects to be discharged to:: Private residence Living  Arrangements: Spouse/significant other Available Help at Discharge: Family Type of Home: House Home Access: Stairs to enter Entrance Stairs-Rails: None   Home Layout: Two level        Prior Function Level of Independence: Independent               Hand Dominance        Extremity/Trunk Assessment   Upper Extremity Assessment Upper Extremity Assessment: Overall WFL for tasks assessed    Lower Extremity Assessment Lower Extremity Assessment: Generalized weakness       Communication   Communication: No difficulties  Cognition Arousal/Alertness: Awake/alert Behavior During Therapy: WFL for tasks assessed/performed Overall Cognitive Status: Within Functional Limits for tasks assessed                                        General Comments      Exercises     Assessment/Plan    PT Assessment Patent does not need any further PT services  PT Problem List         PT Treatment Interventions      PT Goals (Current goals can be found in the Care  Plan section)       Frequency     Barriers to discharge        Co-evaluation               AM-PAC PT "6 Clicks" Mobility  Outcome Measure Help needed turning from your back to your side while in a flat bed without using bedrails?: None Help needed moving from lying on your back to sitting on the side of a flat bed without using bedrails?: None Help needed moving to and from a bed to a chair (including a wheelchair)?: None Help needed standing up from a chair using your arms (e.g., wheelchair or bedside chair)?: None Help needed to walk in hospital room?: None Help needed climbing 3-5 steps with a railing? : None 6 Click Score: 24    End of Session Equipment Utilized During Treatment: Gait belt Activity Tolerance: Patient tolerated treatment well Patient left: in bed Nurse Communication: Mobility status PT Visit Diagnosis: Difficulty in walking, not elsewhere classified (R26.2)     Time: 1430-1455 PT Time Calculation (min) (ACUTE ONLY): 25 min   Charges:   PT Evaluation $PT Eval Low Complexity: 1 Low PT Treatments $Gait Training: 8-22 mins          Alanson Puls, PT DPT 12/07/2019, 8:21 AM

## 2019-12-07 NOTE — Discharge Instructions (Signed)
Dehydration, Adult Dehydration is [name] condition in which there is not enough water or other fluids in the body. This happens when [name] person loses more fluids than he or she takes in. Important organs, such as the kidneys, brain, and heart, cannot function without [name] proper amount of fluids. Any loss of fluids from the body can lead to dehydration. Dehydration can be mild, moderate, or severe. It should be treated right away to prevent it from becoming severe. What are the causes? Dehydration may be caused by:  Conditions that cause loss of water or other fluids, such as diarrhea, vomiting, or sweating or urinating [name] lot.  Not drinking enough fluids, especially when you are ill or doing activities that require [name] lot of energy.  Other illnesses and conditions, such as fever or infection.  Certain medicines, such as medicines that remove excess fluid from the body (diuretics).  Lack of safe drinking water.  Not being able to get enough water and food. What increases the risk? The following factors may make you more likely to develop this condition:  Having [name] long-term (chronic) illness that has not been treated properly, such as diabetes, heart disease, or kidney disease.  Being 65 years of age or older.  Having [name] disability.  Living in [name] place that is high in altitude, where thinner, drier air causes more fluid loss.  Doing exercises that put stress on your body for [name] long time (endurance sports). What are the signs or symptoms? Symptoms of dehydration depend on how severe it is. Mild or moderate dehydration  Thirst.  Dry lips or dry mouth.  Dizziness or light-headedness, especially when standing up from [name] seated position.  Muscle cramps.  Dark urine. Urine may be the color of tea.  Less urine or tears produced than usual.  Headache. Severe dehydration  Changes in skin. Your skin may be cold and clammy, blotchy, or pale. Your skin also may not return to normal after being  lightly pinched and released.  Little or no tears, urine, or sweat.  Changes in vital signs, such as rapid breathing and low blood pressure. Your pulse may be weak or may be faster than 100 beats [name] minute when you are sitting still.  Other changes, such as: ? Feeling very thirsty. ? Sunken eyes. ? Cold hands and feet. ? Confusion. ? Being very tired (lethargic) or having trouble waking from sleep. ? Short-term weight loss. ? Loss of consciousness. How is this diagnosed? This condition is diagnosed based on your symptoms and [name] physical exam. You may have blood and urine tests to help confirm the diagnosis. How is this treated? Treatment for this condition depends on how severe it is. Treatment should be started right away. Do not wait until dehydration becomes severe. Severe dehydration is an emergency and needs to be treated in [name] hospital.  Mild or moderate dehydration can be treated at home. You may be asked to: ? Drink more fluids. ? Drink an oral rehydration solution (ORS). This drink helps restore proper amounts of fluids and salts and minerals in the blood (electrolytes).  Severe dehydration can be treated: ? With IV fluids. ? By correcting abnormal levels of electrolytes. This is often done by giving electrolytes through [name] tube that is passed through your nose and into your stomach (nasogastric tube, or NG tube). ? By treating the underlying cause of dehydration. Follow these instructions at home: Oral rehydration solution If told by your health care provider, drink an ORS:  Make   an ORS by following instructions on the package.  Start by drinking small amounts, about  cup (120 mL) every 5-10 minutes.  Slowly increase how much you drink until you have taken the amount recommended by your health care provider. Eating and drinking         Drink enough clear fluid to keep your urine pale yellow. If you were told to drink an ORS, finish the ORS first and then start slowly  drinking other clear fluids. Drink fluids such as: ? Water. Do not drink only water. Doing that can lead to hyponatremia, which is having too little salt (sodium) in the body. ? Water from ice chips you suck on. ? Fruit juice that you have added water to (diluted fruit juice). ? Low-calorie sports drinks.  Eat foods that contain [name] healthy balance of electrolytes, such as bananas, oranges, potatoes, tomatoes, and spinach.  Do not drink alcohol.  Avoid the following: ? Drinks that contain [name] lot of sugar. These include high-calorie sports drinks, fruit juice that is not diluted, and soda. ? Caffeine. ? Foods that are greasy or contain [name] lot of fat or sugar. General instructions  Take over-the-counter and prescription medicines only as told by your health care provider.  Do not take sodium tablets. Doing that can lead to having too much sodium in the body (hypernatremia).  Return to your normal activities as told by your health care provider. Ask your health care provider what activities are safe for you.  Keep all follow-up visits as told by your health care provider. This is important. Contact [name] health care provider if:  You have muscle cramps, pain, or discomfort, such as: ? Pain in your abdomen and the pain gets worse or stays in one area (localizes). ? Stiff neck.  You have [name] rash.  You are more irritable than usual.  You are sleepier or have [name] harder time waking than usual.  You feel weak or dizzy.  You feel very thirsty. Get help right away if you have:  Any symptoms of severe dehydration.  Symptoms of vomiting, such as: ? You cannot eat or drink without vomiting. ? Vomiting gets worse or does not go away. ? Vomit includes blood or green matter (bile).  Symptoms that get worse with treatment.  [name] fever.  [name] severe headache.  Problems with urination or bowel movements, such as: ? Diarrhea that gets worse or does not go away. ? Blood in your stool (feces). This  may cause stool to look black and tarry. ? Not urinating, or urinating only Deshawn Witty small amount of very dark urine, within 6-8 hours.  Trouble breathing. These symptoms may represent Saiya Crist serious problem that is an emergency. Do not wait to see if the symptoms will go away. Get medical help right away. Call your local emergency services (911 in the [place] Do not drive yourself to the hospital. Summary  Dehydration is Govanni Plemons condition in which there is not enough water or other fluids in the body. This happens when Tymere Depuy person loses more fluids than he or she takes in.  Treatment for this condition depends on how severe it is. Treatment should be started right away. Do not wait until dehydration becomes severe.  Drink enough clear fluid to keep your urine pale yellow. If you were told to drink an oral rehydration solution (ORS), finish the ORS first and then start slowly drinking other clear fluids.  Take over-the-counter and prescription medicines only as told by your health care   provider.  Get help right away if you have any symptoms of severe dehydration. This information is not intended to replace advice given to you by your health care provider. Make sure you discuss any questions you have with your health care provider. Document Revised: 04/23/2019 Document Reviewed: 04/23/2019 Elsevier Patient Education  2020 [employer]   

## 2019-12-07 NOTE — Progress Notes (Signed)
DC'd to home with wife. First shift had gone over discharge instruction and given discharge paperwork.

## 2019-12-09 LAB — GI PATHOGEN PANEL BY PCR, STOOL

## 2019-12-09 LAB — FECAL FAT, QUALITATIVE
Fat Qual Neutral, Stl: NORMAL
Fat Qual Total, Stl: NORMAL

## 2020-04-05 ENCOUNTER — Emergency Department: Payer: Medicare Other

## 2020-04-05 ENCOUNTER — Other Ambulatory Visit: Payer: Self-pay

## 2020-04-05 ENCOUNTER — Emergency Department
Admission: EM | Admit: 2020-04-05 | Discharge: 2020-04-06 | Disposition: A | Discharge status: Home / Self Care | Payer: Medicare Other | Attending: Emergency Medicine | Admitting: Emergency Medicine

## 2020-04-05 DIAGNOSIS — Z7984 Long term (current) use of oral hypoglycemic drugs: Secondary | ICD-10-CM | POA: Insufficient documentation

## 2020-04-05 DIAGNOSIS — R10812 Left upper quadrant abdominal tenderness: Secondary | ICD-10-CM | POA: Insufficient documentation

## 2020-04-05 DIAGNOSIS — R10816 Epigastric abdominal tenderness: Secondary | ICD-10-CM | POA: Insufficient documentation

## 2020-04-05 DIAGNOSIS — R109 Unspecified abdominal pain: Secondary | ICD-10-CM | POA: Diagnosis present

## 2020-04-05 DIAGNOSIS — Z79899 Other long term (current) drug therapy: Secondary | ICD-10-CM | POA: Diagnosis not present

## 2020-04-05 DIAGNOSIS — D7389 Other diseases of spleen: Secondary | ICD-10-CM

## 2020-04-05 DIAGNOSIS — E11649 Type 2 diabetes mellitus with hypoglycemia without coma: Secondary | ICD-10-CM | POA: Insufficient documentation

## 2020-04-05 DIAGNOSIS — I10 Essential (primary) hypertension: Secondary | ICD-10-CM | POA: Diagnosis not present

## 2020-04-05 DIAGNOSIS — K2980 Duodenitis without bleeding: Secondary | ICD-10-CM | POA: Diagnosis not present

## 2020-04-05 DIAGNOSIS — K7689 Other specified diseases of liver: Secondary | ICD-10-CM | POA: Insufficient documentation

## 2020-04-05 DIAGNOSIS — K769 Liver disease, unspecified: Secondary | ICD-10-CM

## 2020-04-05 LAB — CBC
HCT: 35.7 % — ABNORMAL LOW (ref 39.0–52.0)
Hemoglobin: 12.7 g/dL — ABNORMAL LOW (ref 13.0–17.0)
MCH: 30 pg (ref 26.0–34.0)
MCHC: 35.6 g/dL (ref 30.0–36.0)
MCV: 84.2 fL (ref 80.0–100.0)
Platelets: 162 10*3/uL (ref 150–400)
RBC: 4.24 MIL/uL (ref 4.22–5.81)
RDW: 12.4 % (ref 11.5–15.5)
WBC: 7.6 10*3/uL (ref 4.0–10.5)
nRBC: 0 % (ref 0.0–0.2)

## 2020-04-05 LAB — COMPREHENSIVE METABOLIC PANEL
ALT: 98 U/L — ABNORMAL HIGH (ref 0–44)
AST: 78 U/L — ABNORMAL HIGH (ref 15–41)
Albumin: 4 g/dL (ref 3.5–5.0)
Alkaline Phosphatase: 326 U/L — ABNORMAL HIGH (ref 38–126)
Anion gap: 6 (ref 5–15)
BUN: 17 mg/dL (ref 8–23)
CO2: 28 mmol/L (ref 22–32)
Calcium: 9.4 mg/dL (ref 8.9–10.3)
Chloride: 101 mmol/L (ref 98–111)
Creatinine, Ser: 1.3 mg/dL — ABNORMAL HIGH (ref 0.61–1.24)
GFR calc Af Amer: >60 mL/min (ref >60)
GFR calc non Af Amer: 57 mL/min — ABNORMAL LOW (ref >60)
Glucose, Bld: 127 mg/dL — ABNORMAL HIGH (ref 70–99)
Potassium: 4.1 mmol/L (ref 3.5–5.1)
Sodium: 135 mmol/L (ref 135–145)
Total Bilirubin: 1.3 mg/dL — ABNORMAL HIGH (ref 0.3–1.2)
Total Protein: 6.7 g/dL (ref 6.5–8.1)

## 2020-04-05 LAB — LIPASE, BLOOD: Lipase: 29 U/L (ref 11–51)

## 2020-04-05 MED ORDER — SODIUM CHLORIDE 0.9 % IV BOLUS
1000.0000 mL | Freq: Once | INTRAVENOUS | Status: AC
  Administered 2020-04-05: 1000 mL via INTRAVENOUS

## 2020-04-05 MED ORDER — KETOROLAC TROMETHAMINE 30 MG/ML IJ SOLN: 15.0000 mg | Freq: Once | INTRAMUSCULAR | Status: DC

## 2020-04-05 MED ORDER — MORPHINE SULFATE (PF) 4 MG/ML IV SOLN
4.0000 mg | Freq: Once | INTRAVENOUS | Status: DC
  Filled 2020-04-05 (×2): qty 1

## 2020-04-05 MED ORDER — ONDANSETRON HCL 4 MG/2ML IJ SOLN
4.0000 mg | Freq: Once | INTRAMUSCULAR | Status: AC
  Administered 2020-04-05: 4 mg via INTRAVENOUS
  Filled 2020-04-05: qty 2

## 2020-04-05 MED ORDER — PANTOPRAZOLE SODIUM 40 MG PO TBEC: 40.0000 mg | DELAYED_RELEASE_TABLET | Freq: Every day | ORAL | 0 refills | Status: DC

## 2020-04-05 MED ORDER — PANTOPRAZOLE SODIUM 40 MG IV SOLR
40.0000 mg | Freq: Once | INTRAVENOUS | Status: AC
  Administered 2020-04-05: 40 mg via INTRAVENOUS
  Filled 2020-04-05: qty 40

## 2020-04-05 MED ORDER — IOHEXOL 300 MG/ML  SOLN
100.0000 mL | Freq: Once | INTRAMUSCULAR | Status: AC | PRN
  Administered 2020-04-05: 100 mL via INTRAVENOUS

## 2020-04-05 MED ORDER — HYDROCODONE-ACETAMINOPHEN 5-325 MG PO TABS: 1.0000 | ORAL_TABLET | Freq: Four times a day (QID) | ORAL | 0 refills | Status: AC | PRN

## 2020-04-05 MED ORDER — SODIUM CHLORIDE 0.9% FLUSH: 3.0000 mL | Freq: Once | INTRAVENOUS | Status: DC

## 2020-04-05 MED ORDER — DICYCLOMINE HCL 10 MG PO CAPS
10.0000 mg | ORAL_CAPSULE | Freq: Once | ORAL | Status: AC
  Administered 2020-04-05: 10 mg via ORAL
  Filled 2020-04-05: qty 1

## 2020-04-05 NOTE — ED Notes (Signed)
Offered pt snacks, declined d/t them being too high in carbs.

## 2020-04-05 NOTE — ED Notes (Signed)
Pt reports abd pain for 2-3 weeks.  Pt denies n/v/d.  Pt states pain in back and diff urinating.  Pt alert  Speech clear   Pt in hallway bed.  md at bedside.

## 2020-04-05 NOTE — ED Notes (Signed)
Pt states he is feeling better .  Nausea improved.

## 2020-04-05 NOTE — ED Provider Notes (Signed)
[facility] Emergency Department Provider Note  ____________________________________________   First MD Initiated Contact with Patient 04/05/20 1929     (approximate)  I have reviewed the triage vital signs and the nursing notes.   HISTORY  Chief Complaint Abdominal Pain    HPI [name] is a 64 y.o. male  Here with abdominal pain. Pt reports several days of intermittent sharp, stabbing, luq/epigastric abd pain. Pain feels like it's just under his left rib and radiates around to his left side. He has some occasional nausea with this. No vomiting. No diarrhea. No fever, chills. He has a h/o UC s/p colectomy and extensive h/o intra-abd surgeries. No other complaints. No recent travel. No known sick contacts. Pain does not necessarily worsen with eating or drinking.        Past Medical History:  Diagnosis Date  . Diabetes mellitus without complication [facility])     Patient Active Problem List   Diagnosis Date Noted  . Hyponatremia 12/05/2019  . Diarrhea 12/05/2019  . Shingles 12/05/2019  . Dehydration 12/04/2019  . Post traumatic stress disorder (PTSD) 10/07/2018  . Hypoglycemia 05/27/2017  . TBI (traumatic brain injury) ([place]) 04/02/2017  . Essential hypertension 09/04/2016  . Uncontrolled type 2 diabetes mellitus ([place]) 08/08/2016  . Panic attack 08/24/2015  . Obstructive sleep apnea 01/29/2012  . 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 OTHER SURGICAL HISTORY     J pouch 1997, history of ileostomy with reversal    Prior to Admission medications   Medication Sig Start Date End Date Taking? Authorizing Provider  atorvastatin (LIPITOR) 40 MG tablet Take 40 mg by mouth daily. 03/14/17  Yes [provider]  diazepam (VALIUM) 5 MG tablet Take 5 mg by mouth 2 (two) times daily as needed for anxiety. 10/01/19  Yes [provider]  glipiZIDE (GLUCOTROL) 10 MG  tablet Take 10 mg by mouth daily. 10/07/18  Yes [provider]  LANTUS SOLOSTAR 100 UNIT/ML Solostar Pen Inject 35 Units into the skin at bedtime. 09/29/19  Yes [provider]  lisinopril (PRINIVIL,ZESTRIL) 5 MG tablet Take 5 mg by mouth daily. 05/23/17  Yes [provider]  metFORMIN (GLUCOPHAGE) 1000 MG tablet Take 1,000 mg by mouth 2 (two) times daily. 04/08/17  Yes [provider]  omeprazole (PRILOSEC) 40 MG capsule Take 40 mg by mouth daily.    Yes [provider]  venlafaxine XR (EFFEXOR-XR) 150 MG 24 hr capsule Take 150 mg by mouth daily. 03/20/17  Yes [provider]  VICTOZA 18 MG/3ML SOPN Inject 0.6 mg into the skin once a week. 03/22/20  Yes [provider]  CIALIS 10 MG tablet Take 10 mg by mouth as needed. 03/22/20   [provider]  diphenoxylate-atropine (LOMOTIL) 2.5-0.025 MG tablet Take 1 tablet by mouth 4 (four) times daily as needed for diarrhea or loose stools. 11/02/19   [provider]  gabapentin (NEURONTIN) 600 MG tablet Take 0.5 tablets (300 mg total) by mouth at bedtime. Follow with your PCP regarding dose adjustment 12/07/19 01/06/20  Elodia Florence., MD  HYDROcodone-acetaminophen (NORCO/VICODIN) 5-325 MG tablet Take 1-2 tablets by mouth every 6 (six) hours as needed for moderate pain or severe pain. 04/05/20 04/05/21  Duffy Bruce, MD  pantoprazole (PROTONIX) 40 MG tablet Take 1 tablet (40 mg total) by mouth daily for 14 days. 04/05/20 04/19/20  Duffy Bruce, MD    Allergies Sulfa antibiotics  No family history on file.  Social History Social History   Tobacco Use  . Smoking status: Never Smoker  . Smokeless tobacco: Never Used  Substance Use Topics  . Alcohol use: Not on file  . Drug use: Not on file    Review of Systems  Review of Systems  Constitutional: Positive for fatigue. Negative for chills and fever.  HENT: Negative for sore throat.   Respiratory: Negative for  shortness of breath.   Cardiovascular: Negative for chest pain.  Gastrointestinal: Positive for abdominal pain and nausea.  Genitourinary: Negative for flank pain.  Musculoskeletal: Negative for neck pain.  Skin: Negative for rash and wound.  Allergic/Immunologic: Negative for immunocompromised state.  Neurological: Negative for weakness and numbness.  Hematological: Does not bruise/bleed easily.  All other systems reviewed and are negative.    ____________________________________________  PHYSICAL EXAM:      VITAL SIGNS: ED Triage Vitals  Enc Vitals Group     BP 04/05/20 1655 96/71     Pulse Rate 04/05/20 1655 (!) 101     Resp 04/05/20 1655 18     Temp 04/05/20 1655 98.6 F (37 C)     Temp src --      SpO2 --      Weight 04/05/20 1656 188 lb (85.3 kg)     Height 04/05/20 1656 5' 10"  (1.778 m)     Head Circumference --      Peak Flow --      Pain Score 04/05/20 1656 8     Pain Loc --      Pain Edu? --      Excl. in [place]? --      Physical Exam Vitals and nursing note reviewed.  Constitutional:      General: He is not in acute distress.    Appearance: He is well-developed.  HENT:     Head: Normocephalic and atraumatic.  Eyes:     Conjunctiva/sclera: Conjunctivae normal.  Cardiovascular:     Rate and Rhythm: Normal rate and regular rhythm.     Heart sounds: Normal heart sounds. No murmur heard.  No friction rub.  Pulmonary:     Effort: Pulmonary effort is normal. No respiratory distress.     Breath sounds: Normal breath sounds. No wheezing or rales.  Abdominal:     General: There is no distension.     Palpations: Abdomen is soft.     Tenderness: There is abdominal tenderness in the epigastric area and left upper quadrant.  Musculoskeletal:     Cervical back: Neck supple.  Skin:    General: Skin is warm.     Capillary Refill: Capillary refill takes less than 2 seconds.  Neurological:     Mental Status: He is alert and oriented to person, place, and time.      Motor: No abnormal muscle tone.       ____________________________________________   LABS (all labs ordered are listed, but only abnormal results are displayed)  Labs Reviewed  COMPREHENSIVE METABOLIC PANEL - Abnormal; Notable for the following components:      Result Value   Glucose, Bld 127 (*)    Creatinine, Ser 1.30 (*)    AST 78 (*)    ALT 98 (*)    Alkaline Phosphatase 326 (*)    Total Bilirubin 1.3 (*)    GFR calc non Af Amer 57 (*)    All other components within normal limits  CBC - Abnormal; Notable for the following components:  Hemoglobin 12.7 (*)    HCT 35.7 (*)    All other components within normal limits  LIPASE, BLOOD  URINALYSIS, COMPLETE (UACMP) WITH MICROSCOPIC    ____________________________________________  EKG: Normal sinus rhythm, VR 99. PR 198, QRS 76, QTc 415. Rightward axis. No acute St elevation or depression. No ischemia or infarct. ________________________________________  RADIOLOGY All imaging, including plain films, CT scans, and ultrasounds, independently reviewed by me, and interpretations confirmed via formal radiology reads.  ED MD interpretation:   CT A/P: Two new hypoenhancing lesions in liver and spleen, borderline thickening of duodenum  Official radiology report(s): CT ABDOMEN PELVIS W CONTRAST  Result Date: 04/05/2020 CLINICAL DATA:  Nausea and vomiting. Progressive abdominal pain in the left lower quadrant. History of ulcerative colitis and colon cancer with prior colectomy. EXAM: CT ABDOMEN AND PELVIS WITH CONTRAST TECHNIQUE: Multidetector CT imaging of the abdomen and pelvis was performed using the standard protocol following bolus administration of intravenous contrast. CONTRAST:  157m OMNIPAQUE IOHEXOL 300 MG/ML  SOLN COMPARISON:  10/22/2014 FINDINGS: Lower chest: Minimal linear scarring in the lingula. Hepatobiliary: New a hypoenhancing mass in segment 8 of the liver measuring 4.9 by 3.9 cm on image 9/2. New hypoenhancing mass  in segment 5 of the liver measuring 4.1 by 3.6 cm on image 22/2. The gallbladder appears unremarkable.  No biliary dilatation. Pancreas: Unremarkable Spleen: New hypoenhancing 4.6 by 3.8 cm mass in the inferior spleen Adrenals/Urinary Tract: Both adrenal glands appear normal. Urinary bladder unremarkable. No significant renal abnormality is observed. Stomach/Bowel: Borderline wall thickening in the transverse duodenum proximally for example on image 33/2 although the appearance may partially be due to nondistention. Colectomy. Fluid density and rectal pouch with ileo anal anastomosis. Vascular/Lymphatic: No pathologic adenopathy identified. Reproductive: Borderline prostatomegaly. Other: No supplemental non-categorized findings. Musculoskeletal: Mild fatty prominence of the left spermatic cord. Grade 1 degenerative anterolisthesis at L4-5. Lumbar spondylosis and degenerative disc disease noted, with suspected impingement at the L3-4 and L4-5 levels. IMPRESSION: 1. Two new hypoenhancing lesions in the liver and a single new hypoenhancing lesion in the spleen, concerning for possible malignancy/metastatic disease. A specific primary is not identified. 2. Borderline wall thickening in the transverse duodenum proximally, although the appearance may partially be due to nondistention. 3. Colectomy with ileoanal anastomosis. 4. Lumbar spondylosis and degenerative disc disease causing impingement at L3-4 and L4-5 due to spondylosis and degenerative disc disease. 5. Borderline prostatomegaly. Electronically Signed   By: WVan ClinesM.D.   On: 04/05/2020 20:40    ____________________________________________  PROCEDURES   Procedure(s) performed (including Critical Care):  Procedures  ____________________________________________  INITIAL IMPRESSION / [facility] / [facility]RSE  As part of my medical decision making, I reviewed the following data within the [facility] notes reviewed and incorporated, Old chart reviewed, Notes from prior ED visits, and [place] Controlled Substance Database       *Randall Sheridanwas evaluated in Emergency Department on 04/05/2020 for the symptoms described in the history of present illness. He was evaluated in the context of the global COVID-19 pandemic, which necessitated consideration that the patient might be at risk for infection with the SARS-CoV-2 virus that causes COVID-19. Institutional protocols and algorithms that pertain to the evaluation of patients at risk for COVID-19 are in a state of rapid change based on information released by regulatory bodies including the CDC and federal and state organizations. These policies and algorithms were followed during the patient's care in the ED.  Some  ED evaluations and interventions may be delayed as a result of limited staffing during the pandemic.*     Medical Decision Making:  64 yo M here with abd pain, nausea. Labs show mild dehydration, but are otherwise reassuring. CT scan reviewed, shows possible duodenal thickening, also concern for new lesions in spleen and liver. Discussed CT findings with pt in detail. He will need urgent follow-up. Current plan is symptom control with fluids, and reassess. If stable, can arrange close GI f/u for further work-up.  ____________________________________________  FINAL CLINICAL IMPRESSION(S) / ED DIAGNOSES  Final diagnoses:  Duodenitis  Lesion of spleen  Lesion of liver     MEDICATIONS GIVEN DURING THIS VISIT:  Medications  sodium chloride flush (NS) 0.9 % injection 3 mL (3 mLs Intravenous Not Given 04/05/20 2103)  morphine 4 MG/ML injection 4 mg (4 mg Intravenous Refused 04/05/20 2207)  iohexol (OMNIPAQUE) 300 MG/ML solution 100 mL (100 mLs Intravenous Contrast Given 04/05/20 2011)  ondansetron (ZOFRAN) injection 4 mg (4 mg Intravenous Given 04/05/20 2109)  pantoprazole (PROTONIX) injection 40 mg (40 mg Intravenous Given 04/05/20 2110)    sodium chloride 0.9 % bolus 1,000 mL (1,000 mLs Intravenous New Bag/Given 04/05/20 2206)  dicyclomine (BENTYL) capsule 10 mg (10 mg Oral Given 04/05/20 2214)     ED Discharge Orders         Ordered    pantoprazole (PROTONIX) 40 MG tablet  Daily     Discontinue  Reprint     04/05/20 2124    HYDROcodone-acetaminophen (NORCO/VICODIN) 5-325 MG tablet  Every 6 hours PRN     Discontinue  Reprint     04/05/20 2124           Note:  This document was prepared using Dragon voice recognition software and may include unintentional dictation errors.   Duffy Bruce, MD 04/05/20 [phone]

## 2020-04-05 NOTE — ED Notes (Signed)
Called for triage no answer

## 2020-04-05 NOTE — Discharge Instructions (Addendum)
As we discussed, start the antacids for your possible inflammation of the duodenum  Call Dr. Alene Mires to discuss your CT results and to arrange URGENT follow-up imaging  IMPRESSION:  1. Two new hypoenhancing lesions in the liver and a single new  hypoenhancing lesion in the spleen, concerning for possible  malignancy/metastatic disease. A specific primary is not identified.  2. Borderline wall thickening in the transverse duodenum proximally,  although the appearance may partially be due to nondistention.  3. Colectomy with ileoanal anastomosis.  4. Lumbar spondylosis and degenerative disc disease causing  impingement at L3-4 and L4-5 due to spondylosis and degenerative  disc disease.  5. Borderline prostatomegaly.

## 2020-04-05 NOTE — ED Triage Notes (Signed)
Pt comes via POV from home with c/o LLQ pain that started couple weeks ago. Pt states he has had spasms at first and now it has progressed and radiated to back .  Pt states pain and difficulty with urinating. Pt states weakness and feeling like he is going to pass out.

## 2020-04-06 DIAGNOSIS — K2980 Duodenitis without bleeding: Secondary | ICD-10-CM | POA: Diagnosis not present

## 2020-04-06 NOTE — ED Notes (Signed)
Pt up to br   Iv fluids infusing.  Pt alert.  No acute distress.

## 2020-04-06 NOTE — ED Provider Notes (Signed)
12:43 AM Assumed care for off going team.   Blood pressure 103/80, pulse 92, temperature 98.6 F (37 C), resp. rate 16, height 5' 10"  (1.778 m), weight 85.3 kg, SpO2 100 %.  See their HPI for full report but in brief pending fluids and DC home.  Patient feeling better and feels comfortable with discharge home at this time       Vanessa Bandon, MD 04/06/20 [phone]

## 2020-04-27 ENCOUNTER — Ambulatory Visit: Admit: 2020-04-27 | Discharge: 2020-04-27 | Payer: MEDICARE | Attending: Gastroenterology | Primary: Gastroenterology

## 2020-04-27 ENCOUNTER — Ambulatory Visit: Admit: 2020-04-27 | Discharge: 2020-04-28 | Payer: MEDICARE

## 2020-04-27 DIAGNOSIS — K769 Liver disease, unspecified: Principal | ICD-10-CM

## 2020-04-27 DIAGNOSIS — Z86004 Personal history of in-situ neoplasm of other and unspecified digestive organs: Principal | ICD-10-CM

## 2020-04-27 DIAGNOSIS — K8301 Primary sclerosing cholangitis: Principal | ICD-10-CM

## 2020-04-27 DIAGNOSIS — R7309 Other abnormal glucose: Principal | ICD-10-CM

## 2020-04-27 DIAGNOSIS — Z8509 Personal history of malignant neoplasm of other digestive organs: Principal | ICD-10-CM

## 2020-04-27 DIAGNOSIS — R634 Abnormal weight loss: Principal | ICD-10-CM

## 2020-04-27 DIAGNOSIS — R9389 Abnormal findings on diagnostic imaging of other specified body structures: Principal | ICD-10-CM

## 2020-04-27 DIAGNOSIS — K869 Disease of pancreas, unspecified: Principal | ICD-10-CM

## 2020-05-09 ENCOUNTER — Ambulatory Visit (INDEPENDENT_AMBULATORY_CARE_PROVIDER_SITE_OTHER): Payer: Medicare Other | Admitting: Vascular Surgery

## 2020-05-09 ENCOUNTER — Other Ambulatory Visit: Payer: Self-pay

## 2020-05-09 ENCOUNTER — Encounter (INDEPENDENT_AMBULATORY_CARE_PROVIDER_SITE_OTHER): Payer: Self-pay | Admitting: Vascular Surgery

## 2020-05-09 VITALS — BP 97/62 | HR 87 | Ht 70.0 in | Wt 178.0 lb

## 2020-05-09 DIAGNOSIS — E1165 Type 2 diabetes mellitus with hyperglycemia: Secondary | ICD-10-CM | POA: Diagnosis not present

## 2020-05-09 DIAGNOSIS — I872 Venous insufficiency (chronic) (peripheral): Secondary | ICD-10-CM

## 2020-05-09 DIAGNOSIS — I10 Essential (primary) hypertension: Secondary | ICD-10-CM | POA: Diagnosis not present

## 2020-05-10 ENCOUNTER — Encounter: Admit: 2020-05-10 | Discharge: 2020-05-11 | Payer: MEDICARE

## 2020-05-10 ENCOUNTER — Ambulatory Visit: Admit: 2020-05-10 | Discharge: 2020-05-11 | Payer: MEDICARE

## 2020-05-10 ENCOUNTER — Encounter (INDEPENDENT_AMBULATORY_CARE_PROVIDER_SITE_OTHER): Payer: Self-pay | Admitting: Vascular Surgery

## 2020-05-10 DIAGNOSIS — I872 Venous insufficiency (chronic) (peripheral): Secondary | ICD-10-CM | POA: Insufficient documentation

## 2020-05-10 DIAGNOSIS — K769 Liver disease, unspecified: Principal | ICD-10-CM

## 2020-05-10 DIAGNOSIS — J918 Pleural effusion in other conditions classified elsewhere: Principal | ICD-10-CM

## 2020-05-10 NOTE — Progress Notes (Signed)
MRN : [id number]  [name] is a 64 y.o. (Feb 19, 1956) male who presents with chief complaint of  Chief Complaint  Patient presents with  . New Patient (Initial Visit)    [name]  .  History of Present Illness: Patient is seen for evaluation of leg swelling associated with discoloration of the toes on the left foot. The patient first noticed the swelling and color change remotely but is now concerned because of an increase in the overall symptoms. The patient notes that in the morning the legs are significantly improved but they steadily worsened throughout the course of the day.   There is no history of ulcerations.   The patient denies any recent changes in their medications.  The patient has not been wearing graduated compression.  The patient has no had any past angiography, interventions or vascular surgery.  The patient denies a history of DVT or PE. There is no prior history of phlebitis. There is no history of primary lymphedema.  There is no history of radiation treatment to the groin or pelvis No history of malignancies. No history of trauma or groin or pelvic surgery. No history of foreign travel or parasitic infections area   Current Meds  Medication Sig  . atorvastatin (LIPITOR) 40 MG tablet Take 40 mg by mouth daily.  Marland Kitchen CIALIS 10 MG tablet Take 10 mg by mouth as needed.  . diazepam (VALIUM) 5 MG tablet Take 5 mg by mouth 2 (two) times daily as needed for anxiety.  . diphenoxylate-atropine (LOMOTIL) 2.5-0.025 MG tablet Take 1 tablet by mouth 4 (four) times daily as needed for diarrhea or loose stools.  Marland Kitchen LANTUS SOLOSTAR 100 UNIT/ML Solostar Pen Inject 35 Units into the skin at bedtime.  Marland Kitchen lisinopril (PRINIVIL,ZESTRIL) 5 MG tablet Take 5 mg by mouth daily.  . metFORMIN (GLUCOPHAGE) 1000 MG tablet Take 1,000 mg by mouth 2 (two) times daily.  Marland Kitchen omeprazole (PRILOSEC) 40 MG capsule Take 40 mg by mouth daily.   Marland Kitchen venlafaxine XR (EFFEXOR-XR) 150 MG 24 hr capsule  Take 150 mg by mouth daily.  Marland Kitchen VICTOZA 18 MG/3ML SOPN Inject 0.6 mg into the skin once a week.    Past Medical History:  Diagnosis Date  . Diabetes mellitus without complication [facility])     Past Surgical History:  Procedure Laterality Date  . OTHER SURGICAL HISTORY     J pouch 1997, history of ileostomy with reversal    Social History Social History   Tobacco Use  . Smoking status: Never Smoker  . Smokeless tobacco: Never Used  Substance Use Topics  . Alcohol use: Not on file  . Drug use: Not on file    Family History No family history of bleeding/clotting disorders, porphyria or autoimmune disease   Allergies  Allergen Reactions  . Sulfa Antibiotics Rash and Hives  . Oxycodone Other (See Comments) and Rash    Rash and headache     REVIEW OF SYSTEMS (Negative unless checked)  Constitutional: [] Weight loss  [] Fever  [] Chills Cardiac: [] Chest pain   [] Chest pressure   [] Palpitations   [] Shortness of breath when laying flat   [] Shortness of breath with exertion. Vascular:  [] Pain in legs with walking   [] Pain in legs at rest  [] History of DVT   [] Phlebitis   [x] Swelling in legs   [] Varicose veins   [] Non-healing ulcers Pulmonary:   [] Uses home oxygen   [] Productive cough   [] Hemoptysis   [] Wheeze  [] COPD   [] Asthma Neurologic:  []   Dizziness   [] Seizures   [] History of stroke   [] History of TIA  [] Aphasia   [] Vissual changes   [] Weakness or numbness in arm   [] Weakness or numbness in leg Musculoskeletal:   [] Joint swelling   [x] Joint pain   [] Low back pain Hematologic:  [] Easy bruising  [] Easy bleeding   [] Hypercoagulable state   [] Anemic Gastrointestinal:  [] Diarrhea   [] Vomiting  [] Gastroesophageal reflux/heartburn   [] Difficulty swallowing. Genitourinary:  [] Chronic kidney disease   [] Difficult urination  [] Frequent urination   [] Blood in urine Skin:  [] Rashes   [] Ulcers  Psychological:  [] History of anxiety   []  History of major depression.  Physical  Examination  Vitals:   05/09/20 1515  BP: 97/62  Pulse: 87  Weight: 178 lb (80.7 kg)  Height: 5' 10"  (1.778 m)   Body mass index is 25.54 kg/m. Gen: WD/WN, NAD Head: [place]/AT, No temporalis wasting.  Ear/Nose/Throat: Hearing grossly intact, nares w/o erythema or drainage, poor dentition Eyes: PER, EOMI, sclera nonicteric.  Neck: Supple, no masses.  No bruit or JVD.  Pulmonary:  Good air movement, clear to auscultation bilaterally, no use of accessory muscles.  Cardiac: RRR, normal S1, S2, no Murmurs. Vascular: scattered varicosities present bilaterally.  Mild venous stasis changes to the legs bilaterally.  2+ soft pitting edema. Vessel Right Left  Radial Palpable Palpable  PT Palpable Palpable  DP Palpable Palpable  Gastrointestinal: soft, non-distended. No guarding/no peritoneal signs.  Musculoskeletal: M/S 5/5 throughout.  No deformity or atrophy.  Neurologic: CN 2-12 intact. Pain and light touch intact in extremities.  Symmetrical.  Speech is fluent. Motor exam as listed above. Psychiatric: Judgment intact, Mood & affect appropriate for pt's clinical situation. Dermatologic: No rashes or ulcers noted.  No changes consistent with cellulitis.   CBC Lab Results  Component Value Date   WBC 7.6 04/05/2020   HGB 12.7 (L) 04/05/2020   HCT 35.7 (L) 04/05/2020   MCV 84.2 04/05/2020   PLT 162 04/05/2020    BMET    Component Value Date/Time   NA 135 04/05/2020 1658   NA 138 10/23/2014 0428   K 4.1 04/05/2020 1658   K 4.0 10/23/2014 0428   CL 101 04/05/2020 1658   CL 107 10/23/2014 0428   CO2 28 04/05/2020 1658   CO2 26 10/23/2014 0428   GLUCOSE 127 (H) 04/05/2020 1658   GLUCOSE 175 (H) 10/23/2014 0428   BUN 17 04/05/2020 1658   BUN 16 10/23/2014 0428   CREATININE 1.30 (H) 04/05/2020 1658   CREATININE 1.41 (H) 10/23/2014 0428   CALCIUM 9.4 04/05/2020 1658   CALCIUM 8.3 (L) 10/23/2014 0428   GFRNONAA 57 (L) 04/05/2020 1658   GFRNONAA 55 (L) 10/23/2014 0428   GFRNONAA  47 (L) 11/12/2013 1736   GFRAA >60 04/05/2020 1658   GFRAA >60 10/23/2014 0428   GFRAA 54 (L) 11/12/2013 1736   CrCl cannot be calculated (Patient's most recent lab result is older than the maximum 21 days allowed.).  COAG No results found for: INR, PROTIME  Radiology No results found.    Assessment/Plan 1. Chronic venous insufficiency I have had a long discussion with the patient regarding swelling and why it  causes symptoms.  Patient will begin wearing graduated compression stockings on a daily basis. The patient will  beginning wearing the stockings first thing in the morning and removing them in the evening. The patient is instructed specifically not to sleep in the stockings.   In addition, behavioral modification will be initiated.  This will include frequent elevation, use of over the counter pain medications and exercise such as walking.  I have reviewed systemic causes for chronic edema such as liver, kidney and cardiac etiologies.  The patient denies problems with these organ systems.    The patient will follow-up with me prn.    2. Essential hypertension Continue antihypertensive medications as already ordered, these medications have been reviewed and there are no changes at this time.   3. Uncontrolled type 2 diabetes mellitus with hyperglycemia ([place]) Continue hypoglycemic medications as already ordered, these medications have been reviewed and there are no changes at this time.  Hgb A1C to be monitored as already arranged by primary service    Hortencia Pilar, MD  05/10/2020 2:09 PM

## 2020-05-13 ENCOUNTER — Ambulatory Visit: Admit: 2020-05-13 | Discharge: 2020-05-13 | Payer: MEDICARE

## 2020-05-13 ENCOUNTER — Encounter: Admit: 2020-05-13 | Discharge: 2020-05-13 | Payer: MEDICARE

## 2020-05-13 DIAGNOSIS — K769 Liver disease, unspecified: Principal | ICD-10-CM

## 2020-05-13 DIAGNOSIS — R634 Abnormal weight loss: Principal | ICD-10-CM

## 2020-05-24 ENCOUNTER — Ambulatory Visit: Admit: 2020-05-24 | Discharge: 2020-05-25 | Payer: MEDICARE

## 2020-05-25 MED ORDER — OXYCODONE 5 MG TABLET
ORAL_TABLET | ORAL | 0 refills | 4 days | Status: CP | PRN
Start: 2020-05-25 — End: 2020-05-30
  Filled 2020-05-25: qty 20, 4d supply, fill #0

## 2020-05-25 MED FILL — OXYCODONE 5 MG TABLET: 4 days supply | Qty: 20 | Fill #0 | Status: AC

## 2020-05-28 ENCOUNTER — Ambulatory Visit: Admit: 2020-05-28 | Discharge: 2020-06-09 | Disposition: A | Discharge status: Home / Self Care | Payer: MEDICARE

## 2020-06-03 MED ORDER — PROCHLORPERAZINE MALEATE 10 MG TABLET
ORAL_TABLET | Freq: Four times a day (QID) | ORAL | 2 refills | 8.00000 days | Status: CP | PRN
Start: 2020-06-03 — End: ?

## 2020-06-03 MED ORDER — LEVOFLOXACIN 500 MG TABLET
ORAL_TABLET | Freq: Every day | ORAL | 5 refills | 30 days | Status: CP
Start: 2020-06-03 — End: 2021-06-03

## 2020-06-03 MED ORDER — ONDANSETRON HCL 8 MG TABLET
ORAL_TABLET | 5 refills | 0 days | Status: CP
Start: 2020-06-03 — End: ?

## 2020-06-08 DIAGNOSIS — C8510 Unspecified B-cell lymphoma, unspecified site: Principal | ICD-10-CM

## 2020-06-09 MED ORDER — NYSTATIN 100,000 UNIT/ML ORAL SUSPENSION
Freq: Four times a day (QID) | ORAL | 0 refills | 3.00000 days | Status: CP
Start: 2020-06-09 — End: 2020-06-09
  Filled 2020-06-10: qty 60, 3d supply, fill #0

## 2020-06-09 MED ORDER — VICTOZA 2-PAK 0.6 MG/0.1 ML (18 MG/3 ML) SUBCUTANEOUS PEN INJECTOR
SUBCUTANEOUS | 0 refills | 44.00000 days | Status: CP
Start: 2020-06-09 — End: 2020-06-09
  Filled 2020-06-10: qty 12, 44d supply, fill #0

## 2020-06-09 MED ORDER — LIDOCAINE-DIPHENHYD-AL-MAG-SIM 200 MG-25 MG-400 MG-40MG/30ML MOUTHWASH
Freq: Four times a day (QID) | OROMUCOSAL | 0 refills | 6.00000 days | Status: CP | PRN
Start: 2020-06-09 — End: 2020-06-09
  Filled 2020-06-10: qty 118, 5d supply, fill #0

## 2020-06-09 MED ORDER — LIDOCAINE-DIPHENHYD-AL-MAG-SIM 200 MG-25 MG-400 MG-40MG/30ML MOUTHWASH: 10 mL | mL | Freq: Four times a day (QID) | 0 refills | 6 days | Status: AC

## 2020-06-09 MED ORDER — OXYCODONE 5 MG TABLET
ORAL_TABLET | ORAL | 0 refills | 4.00000 days | Status: CP | PRN
Start: 2020-06-09 — End: 2020-06-09
  Filled 2020-06-10: qty 20, 4d supply, fill #0

## 2020-06-09 MED ORDER — K-PHOS-NEUTRAL 250 MG TABLET: 250 mg | tablet | Freq: Three times a day (TID) | 0 refills | 14 days | Status: AC

## 2020-06-09 MED ORDER — K-PHOS-NEUTRAL 250 MG TABLET
ORAL_TABLET | Freq: Three times a day (TID) | ORAL | 0 refills | 14.00000 days | Status: CP
Start: 2020-06-09 — End: 2020-06-09
  Filled 2020-06-10: qty 42, 14d supply, fill #0

## 2020-06-09 MED ORDER — ALLOPURINOL 300 MG TABLET: 300 mg | tablet | Freq: Every day | 0 refills | 14 days | Status: AC

## 2020-06-09 MED ORDER — VICTOZA 2-PAK 0.6 MG/0.1 ML (18 MG/3 ML) SUBCUTANEOUS PEN INJECTOR: mL | 0 refills | 44 days | Status: AC

## 2020-06-09 MED ORDER — PROCHLORPERAZINE MALEATE 10 MG TABLET
ORAL_TABLET | Freq: Four times a day (QID) | ORAL | 2 refills | 8.00000 days | Status: CP | PRN
Start: 2020-06-09 — End: 2020-07-09
  Filled 2020-06-09: qty 30, 8d supply, fill #0

## 2020-06-09 MED ORDER — OXYCODONE 5 MG TABLET: 5 mg | tablet | 0 refills | 4 days | Status: AC

## 2020-06-09 MED ORDER — MAGNESIUM OXIDE 400 MG (241.3 MG MAGNESIUM) TABLET: 400 mg | tablet | Freq: Every day | 0 refills | 120 days | Status: AC

## 2020-06-09 MED ORDER — ONDANSETRON HCL 8 MG TABLET
ORAL_TABLET | 5 refills | 0 days | Status: CP
Start: 2020-06-09 — End: ?
  Filled 2020-06-09: qty 30, 10d supply, fill #0

## 2020-06-09 MED ORDER — MAGNESIUM OXIDE 400 MG (241.3 MG MAGNESIUM) TABLET
ORAL_TABLET | Freq: Every day | ORAL | 0 refills | 120.00000 days | Status: CP
Start: 2020-06-09 — End: 2020-06-09
  Filled 2020-06-10: qty 120, 120d supply, fill #0

## 2020-06-09 MED ORDER — NYSTATIN 100,000 UNIT/ML ORAL SUSPENSION: 500000 [IU] | mL | Freq: Four times a day (QID) | 0 refills | 3 days | Status: AC

## 2020-06-09 MED ORDER — LEVOFLOXACIN 500 MG TABLET
ORAL_TABLET | Freq: Every day | ORAL | 5 refills | 30 days | Status: CP
Start: 2020-06-09 — End: 2021-06-09
  Filled 2020-06-09: qty 30, 30d supply, fill #0

## 2020-06-09 MED FILL — ONDANSETRON HCL 8 MG TABLET: 10 days supply | Qty: 30 | Fill #0 | Status: AC

## 2020-06-09 MED FILL — PROCHLORPERAZINE MALEATE 10 MG TABLET: 8 days supply | Qty: 30 | Fill #0 | Status: AC

## 2020-06-09 MED FILL — LEVOFLOXACIN 500 MG TABLET: 30 days supply | Qty: 30 | Fill #0 | Status: AC

## 2020-06-10 DIAGNOSIS — C8599 Non-Hodgkin lymphoma, unspecified, extranodal and solid organ sites: Principal | ICD-10-CM

## 2020-06-10 MED ORDER — ALUMINUM-MAG HYDROXIDE-SIMETHICONE 200 MG-200 MG-20 MG/5 ML ORAL SUSP
ORAL | 0 refills | 0 days
Start: 2020-06-10 — End: ?

## 2020-06-10 MED ORDER — ALLOPURINOL 300 MG TABLET
ORAL_TABLET | Freq: Every day | ORAL | 0 refills | 14.00000 days | Status: CP
Start: 2020-06-10 — End: 2020-06-24
  Filled 2020-06-10: qty 14, 14d supply, fill #0

## 2020-06-10 MED ORDER — DIPHENHYDRAMINE 12.5 MG/5 ML ORAL LIQUID
0 refills | 0 days
Start: 2020-06-10 — End: ?

## 2020-06-10 MED FILL — ALLOPURINOL 300 MG TABLET: 14 days supply | Qty: 14 | Fill #0 | Status: AC

## 2020-06-10 MED FILL — LIDOCAINE VISCOUS 2 % MUCOSAL SOLUTION: 3 days supply | Qty: 100 | Fill #0 | Status: AC

## 2020-06-10 MED FILL — K-PHOS-NEUTRAL 250 MG TABLET: 14 days supply | Qty: 42 | Fill #0 | Status: AC

## 2020-06-10 MED FILL — LIDOCAINE VISCOUS 2 % MUCOSAL SOLUTION: OROMUCOSAL | 3 days supply | Qty: 100 | Fill #0

## 2020-06-10 MED FILL — ANTACID REGULAR STRENGTH 200 MG-200 MG-20 MG/5 ML ORAL SUSPENSION: ORAL | 10 days supply | Qty: 355 | Fill #0

## 2020-06-10 MED FILL — NYSTATIN 100,000 UNIT/ML ORAL SUSPENSION: 3 days supply | Qty: 60 | Fill #0 | Status: AC

## 2020-06-10 MED FILL — MAGNESIUM OXIDE 400 MG (241.3 MG MAGNESIUM) TABLET: 120 days supply | Qty: 120 | Fill #0 | Status: AC

## 2020-06-10 MED FILL — OXYCODONE 5 MG TABLET: 4 days supply | Qty: 20 | Fill #0 | Status: AC

## 2020-06-10 MED FILL — VICTOZA 2-PAK 0.6 MG/0.1 ML (18 MG/3 ML) SUBCUTANEOUS PEN INJECTOR: 44 days supply | Qty: 12 | Fill #0 | Status: AC

## 2020-06-10 MED FILL — DIPHENHYDRAMINE 12.5 MG/5 ML ORAL LIQUID: 5 days supply | Qty: 118 | Fill #0 | Status: AC

## 2020-06-10 MED FILL — ANTACID REGULAR STRENGTH 200 MG-200 MG-20 MG/5 ML ORAL SUSPENSION: 10 days supply | Qty: 355 | Fill #0 | Status: AC

## 2020-06-11 ENCOUNTER — Ambulatory Visit
Admit: 2020-06-11 | Discharge: 2020-07-12 | Disposition: A | Discharge status: Skilled Nursing Facility | Payer: MEDICARE | Admitting: Internal Medicine

## 2020-06-11 ENCOUNTER — Ambulatory Visit
Admit: 2020-06-10 | Discharge: 2020-06-11 | Disposition: A | Discharge status: Skilled Nursing Facility | Payer: MEDICARE | Admitting: Internal Medicine

## 2020-06-24 DIAGNOSIS — C8599 Non-Hodgkin lymphoma, unspecified, extranodal and solid organ sites: Principal | ICD-10-CM

## 2020-06-27 ENCOUNTER — Other Ambulatory Visit: Payer: Self-pay

## 2020-06-28 ENCOUNTER — Ambulatory Visit: Payer: Medicare Other | Admitting: Gastroenterology

## 2020-07-12 MED ORDER — LOPERAMIDE 0.2 MG/ML ORAL SOLUTION
Freq: Three times a day (TID) | GASTROENTERAL | 0 refills | 0.00000 days | PRN
Start: 2020-07-12 — End: ?

## 2020-07-12 MED ORDER — PANTOPRAZOLE 2MG/ML SUS
Freq: Every day | GASTROENTERAL | 0 refills | 30 days
Start: 2020-07-12 — End: 2020-08-11

## 2020-07-12 MED ORDER — VENLAFAXINE 75 MG TABLET
ORAL_TABLET | Freq: Two times a day (BID) | GASTROSTOMY | 0 refills | 30 days
Start: 2020-07-12 — End: 2020-08-11

## 2020-07-12 MED ORDER — METFORMIN 500 MG TABLET
Freq: Two times a day (BID) | GASTROSTOMY | 0.00000 days
Start: 2020-07-12 — End: ?

## 2020-07-12 MED ORDER — MIDODRINE 5 MG TABLET
ORAL_TABLET | Freq: Three times a day (TID) | GASTROSTOMY | 0 refills | 30.00000 days
Start: 2020-07-12 — End: 2020-08-11

## 2020-07-12 MED ORDER — INSULIN NPH ISOPHANE U-100 HUMAN 100 UNIT/ML SUBCUTANEOUS SUSPENSION
Freq: Two times a day (BID) | SUBCUTANEOUS | 0 refills | 30.00000 days
Start: 2020-07-12 — End: 2020-08-11

## 2020-07-12 MED ORDER — ATORVASTATIN 40 MG TABLET
ORAL_TABLET | Freq: Every day | GASTROSTOMY | 0 refills | 30 days
Start: 2020-07-12 — End: 2020-08-11

## 2020-07-14 DIAGNOSIS — C8330 Diffuse large B-cell lymphoma, unspecified site: Principal | ICD-10-CM

## 2020-07-15 ENCOUNTER — Encounter: Admit: 2020-07-15 | Discharge: 2020-07-16 | Payer: MEDICARE

## 2020-07-15 ENCOUNTER — Ambulatory Visit: Admit: 2020-07-15 | Discharge: 2020-07-16 | Payer: MEDICARE

## 2020-07-15 DIAGNOSIS — C8330 Diffuse large B-cell lymphoma, unspecified site: Principal | ICD-10-CM

## 2020-07-15 DIAGNOSIS — C8599 Non-Hodgkin lymphoma, unspecified, extranodal and solid organ sites: Principal | ICD-10-CM

## 2020-07-15 DIAGNOSIS — C8510 Unspecified B-cell lymphoma, unspecified site: Principal | ICD-10-CM

## 2020-07-15 DIAGNOSIS — C8338 Diffuse large B-cell lymphoma, lymph nodes of multiple sites: Principal | ICD-10-CM

## 2020-07-26 ENCOUNTER — Ambulatory Visit
Admit: 2020-07-26 | Discharge: 2020-07-30 | Disposition: A | Discharge status: Home Health | Payer: MEDICARE | Admitting: Medical Oncology

## 2020-07-26 DIAGNOSIS — E871 Hypo-osmolality and hyponatremia: Principal | ICD-10-CM

## 2020-07-26 DIAGNOSIS — Z9221 Personal history of antineoplastic chemotherapy: Principal | ICD-10-CM

## 2020-07-26 DIAGNOSIS — I951 Orthostatic hypotension: Principal | ICD-10-CM

## 2020-07-26 DIAGNOSIS — K219 Gastro-esophageal reflux disease without esophagitis: Principal | ICD-10-CM

## 2020-07-26 DIAGNOSIS — E861 Hypovolemia: Principal | ICD-10-CM

## 2020-07-26 DIAGNOSIS — F411 Generalized anxiety disorder: Principal | ICD-10-CM

## 2020-07-26 DIAGNOSIS — E860 Dehydration: Principal | ICD-10-CM

## 2020-07-26 DIAGNOSIS — G4761 Periodic limb movement disorder: Principal | ICD-10-CM

## 2020-07-26 DIAGNOSIS — Z6826 Body mass index (BMI) 26.0-26.9, adult: Principal | ICD-10-CM

## 2020-07-26 DIAGNOSIS — S0001XA Abrasion of scalp, initial encounter: Principal | ICD-10-CM

## 2020-07-26 DIAGNOSIS — Z79899 Other long term (current) drug therapy: Principal | ICD-10-CM

## 2020-07-26 DIAGNOSIS — Z8711 Personal history of peptic ulcer disease: Principal | ICD-10-CM

## 2020-07-26 DIAGNOSIS — Z20822 Contact with and (suspected) exposure to covid-19: Principal | ICD-10-CM

## 2020-07-26 DIAGNOSIS — I959 Hypotension, unspecified: Principal | ICD-10-CM

## 2020-07-26 DIAGNOSIS — R000 Tachycardia, unspecified: Principal | ICD-10-CM

## 2020-07-26 DIAGNOSIS — Z882 Allergy status to sulfonamides status: Principal | ICD-10-CM

## 2020-07-26 DIAGNOSIS — N179 Acute kidney failure, unspecified: Principal | ICD-10-CM

## 2020-07-26 DIAGNOSIS — T464X1A Poisoning by angiotensin-converting-enzyme inhibitors, accidental (unintentional), initial encounter: Principal | ICD-10-CM

## 2020-07-26 DIAGNOSIS — R531 Weakness: Principal | ICD-10-CM

## 2020-07-26 DIAGNOSIS — R0902 Hypoxemia: Principal | ICD-10-CM

## 2020-07-26 DIAGNOSIS — Z85038 Personal history of other malignant neoplasm of large intestine: Principal | ICD-10-CM

## 2020-07-26 DIAGNOSIS — C8338 Diffuse large B-cell lymphoma, lymph nodes of multiple sites: Principal | ICD-10-CM

## 2020-07-26 DIAGNOSIS — I10 Essential (primary) hypertension: Principal | ICD-10-CM

## 2020-07-26 DIAGNOSIS — C8510 Unspecified B-cell lymphoma, unspecified site: Principal | ICD-10-CM

## 2020-07-26 DIAGNOSIS — C8330 Diffuse large B-cell lymphoma, unspecified site: Principal | ICD-10-CM

## 2020-07-26 DIAGNOSIS — E46 Unspecified protein-calorie malnutrition: Principal | ICD-10-CM

## 2020-07-26 DIAGNOSIS — E1169 Type 2 diabetes mellitus with other specified complication: Principal | ICD-10-CM

## 2020-07-26 DIAGNOSIS — K529 Noninfective gastroenteritis and colitis, unspecified: Principal | ICD-10-CM

## 2020-07-26 DIAGNOSIS — R55 Syncope and collapse: Principal | ICD-10-CM

## 2020-07-26 DIAGNOSIS — Z794 Long term (current) use of insulin: Principal | ICD-10-CM

## 2020-07-26 DIAGNOSIS — F4310 Post-traumatic stress disorder, unspecified: Principal | ICD-10-CM

## 2020-07-26 DIAGNOSIS — R54 Age-related physical debility: Principal | ICD-10-CM

## 2020-07-26 DIAGNOSIS — I872 Venous insufficiency (chronic) (peripheral): Principal | ICD-10-CM

## 2020-07-26 DIAGNOSIS — E872 Acidosis: Principal | ICD-10-CM

## 2020-07-26 DIAGNOSIS — E1165 Type 2 diabetes mellitus with hyperglycemia: Principal | ICD-10-CM

## 2020-07-26 DIAGNOSIS — R571 Hypovolemic shock: Principal | ICD-10-CM

## 2020-07-26 DIAGNOSIS — B351 Tinea unguium: Principal | ICD-10-CM

## 2020-07-26 DIAGNOSIS — Z9049 Acquired absence of other specified parts of digestive tract: Principal | ICD-10-CM

## 2020-07-27 DIAGNOSIS — E860 Dehydration: Principal | ICD-10-CM

## 2020-07-27 DIAGNOSIS — Z9221 Personal history of antineoplastic chemotherapy: Principal | ICD-10-CM

## 2020-07-27 DIAGNOSIS — Z882 Allergy status to sulfonamides status: Principal | ICD-10-CM

## 2020-07-27 DIAGNOSIS — S0001XA Abrasion of scalp, initial encounter: Principal | ICD-10-CM

## 2020-07-27 DIAGNOSIS — R0902 Hypoxemia: Principal | ICD-10-CM

## 2020-07-27 DIAGNOSIS — I951 Orthostatic hypotension: Principal | ICD-10-CM

## 2020-07-27 DIAGNOSIS — I872 Venous insufficiency (chronic) (peripheral): Principal | ICD-10-CM

## 2020-07-27 DIAGNOSIS — I959 Hypotension, unspecified: Principal | ICD-10-CM

## 2020-07-27 DIAGNOSIS — Z8711 Personal history of peptic ulcer disease: Principal | ICD-10-CM

## 2020-07-27 DIAGNOSIS — B351 Tinea unguium: Principal | ICD-10-CM

## 2020-07-27 DIAGNOSIS — C8330 Diffuse large B-cell lymphoma, unspecified site: Principal | ICD-10-CM

## 2020-07-27 DIAGNOSIS — Z9049 Acquired absence of other specified parts of digestive tract: Principal | ICD-10-CM

## 2020-07-27 DIAGNOSIS — N179 Acute kidney failure, unspecified: Principal | ICD-10-CM

## 2020-07-27 DIAGNOSIS — E861 Hypovolemia: Principal | ICD-10-CM

## 2020-07-27 DIAGNOSIS — K529 Noninfective gastroenteritis and colitis, unspecified: Principal | ICD-10-CM

## 2020-07-27 DIAGNOSIS — Z794 Long term (current) use of insulin: Principal | ICD-10-CM

## 2020-07-27 DIAGNOSIS — Z85038 Personal history of other malignant neoplasm of large intestine: Principal | ICD-10-CM

## 2020-07-27 DIAGNOSIS — F4310 Post-traumatic stress disorder, unspecified: Principal | ICD-10-CM

## 2020-07-27 DIAGNOSIS — T464X1A Poisoning by angiotensin-converting-enzyme inhibitors, accidental (unintentional), initial encounter: Principal | ICD-10-CM

## 2020-07-27 DIAGNOSIS — R54 Age-related physical debility: Principal | ICD-10-CM

## 2020-07-27 DIAGNOSIS — R000 Tachycardia, unspecified: Principal | ICD-10-CM

## 2020-07-27 DIAGNOSIS — K219 Gastro-esophageal reflux disease without esophagitis: Principal | ICD-10-CM

## 2020-07-27 DIAGNOSIS — E871 Hypo-osmolality and hyponatremia: Principal | ICD-10-CM

## 2020-07-27 DIAGNOSIS — E1165 Type 2 diabetes mellitus with hyperglycemia: Principal | ICD-10-CM

## 2020-07-27 DIAGNOSIS — G4761 Periodic limb movement disorder: Principal | ICD-10-CM

## 2020-07-27 DIAGNOSIS — E872 Acidosis: Principal | ICD-10-CM

## 2020-07-27 DIAGNOSIS — R571 Hypovolemic shock: Principal | ICD-10-CM

## 2020-07-27 DIAGNOSIS — Z20822 Contact with and (suspected) exposure to covid-19: Principal | ICD-10-CM

## 2020-07-27 DIAGNOSIS — F411 Generalized anxiety disorder: Principal | ICD-10-CM

## 2020-07-27 DIAGNOSIS — E46 Unspecified protein-calorie malnutrition: Principal | ICD-10-CM

## 2020-07-27 DIAGNOSIS — Z6826 Body mass index (BMI) 26.0-26.9, adult: Principal | ICD-10-CM

## 2020-07-27 DIAGNOSIS — I10 Essential (primary) hypertension: Principal | ICD-10-CM

## 2020-07-27 DIAGNOSIS — C8599 Non-Hodgkin lymphoma, unspecified, extranodal and solid organ sites: Principal | ICD-10-CM

## 2020-07-27 DIAGNOSIS — Z79899 Other long term (current) drug therapy: Principal | ICD-10-CM

## 2020-07-28 ENCOUNTER — Ambulatory Visit: Admit: 2020-07-28 | Payer: MEDICARE

## 2020-07-30 MED ORDER — METFORMIN 500 MG TABLET
ORAL_TABLET | Freq: Two times a day (BID) | ORAL | 0 refills | 30.00000 days
Start: 2020-07-30 — End: 2020-09-09

## 2020-07-30 MED ORDER — MG-PLUS-PROTEIN 133 MG TABLET
ORAL_TABLET | Freq: Two times a day (BID) | ORAL | 0 refills | 7 days
Start: 2020-07-30 — End: 2020-08-06

## 2020-07-30 MED ORDER — LOPERAMIDE 0.2 MG/ML ORAL SOLUTION
Freq: Three times a day (TID) | ORAL | 0 refills | 0.00000 days | PRN
Start: 2020-07-30 — End: 2020-08-29

## 2020-07-30 MED ORDER — VENLAFAXINE 75 MG TABLET
ORAL_TABLET | Freq: Two times a day (BID) | ORAL | 0 refills | 30.00000 days
Start: 2020-07-30 — End: 2020-09-19

## 2020-07-30 MED ORDER — POTASSIUM, SODIUM PHOSPHATES 280 MG-160 MG-250 MG ORAL POWDER PACKET
PACK | Freq: Two times a day (BID) | ORAL | 0 refills | 7 days | Status: CP
Start: 2020-07-30 — End: 2020-08-06
  Filled 2020-07-30: qty 28, 7d supply, fill #0

## 2020-07-30 MED ORDER — ATORVASTATIN 40 MG TABLET
ORAL_TABLET | Freq: Every day | ORAL | 0 refills | 30.00000 days
Start: 2020-07-30 — End: 2020-09-19

## 2020-07-30 MED ORDER — MIDODRINE 10 MG TABLET
ORAL_TABLET | Freq: Three times a day (TID) | ORAL | 0 refills | 30 days | Status: CP
Start: 2020-07-30 — End: 2020-08-29
  Filled 2020-07-30: qty 90, 30d supply, fill #0

## 2020-07-30 MED ORDER — VALACYCLOVIR 500 MG TABLET
ORAL_TABLET | Freq: Every day | ORAL | 2 refills | 30.00000 days | Status: CP
Start: 2020-07-30 — End: 2020-09-19
  Filled 2020-07-30: qty 30, 30d supply, fill #0

## 2020-07-30 MED FILL — MIDODRINE 10 MG TABLET: 30 days supply | Qty: 90 | Fill #0 | Status: AC

## 2020-07-30 MED FILL — VALACYCLOVIR 500 MG TABLET: 30 days supply | Qty: 30 | Fill #0 | Status: AC

## 2020-07-30 MED FILL — PHOS-NAK 280 MG-160 MG-250 MG ORAL POWDER PACKET: 7 days supply | Qty: 28 | Fill #0 | Status: AC

## 2020-07-31 MED ORDER — PANTOPRAZOLE 40 MG TABLET,DELAYED RELEASE
ORAL_TABLET | Freq: Every day | ORAL | 0 refills | 30.00000 days | Status: CP
Start: 2020-07-31 — End: 2020-09-03
  Filled 2020-08-04: qty 30, 30d supply, fill #0

## 2020-08-01 ENCOUNTER — Ambulatory Visit: Admit: 2020-08-01 | Discharge: 2020-08-02 | Payer: MEDICARE

## 2020-08-01 ENCOUNTER — Encounter: Admit: 2020-08-01 | Discharge: 2020-08-02 | Payer: MEDICARE

## 2020-08-01 DIAGNOSIS — C8599 Non-Hodgkin lymphoma, unspecified, extranodal and solid organ sites: Principal | ICD-10-CM

## 2020-08-01 DIAGNOSIS — C8338 Diffuse large B-cell lymphoma, lymph nodes of multiple sites: Principal | ICD-10-CM

## 2020-08-02 DIAGNOSIS — C8599 Non-Hodgkin lymphoma, unspecified, extranodal and solid organ sites: Principal | ICD-10-CM

## 2020-08-04 ENCOUNTER — Encounter: Admit: 2020-08-04 | Discharge: 2020-08-05 | Payer: MEDICARE

## 2020-08-04 DIAGNOSIS — Z794 Long term (current) use of insulin: Principal | ICD-10-CM

## 2020-08-04 DIAGNOSIS — Z5111 Encounter for antineoplastic chemotherapy: Principal | ICD-10-CM

## 2020-08-04 DIAGNOSIS — K219 Gastro-esophageal reflux disease without esophagitis: Principal | ICD-10-CM

## 2020-08-04 DIAGNOSIS — K5190 Ulcerative colitis, unspecified, without complications: Principal | ICD-10-CM

## 2020-08-04 DIAGNOSIS — E1140 Type 2 diabetes mellitus with diabetic neuropathy, unspecified: Principal | ICD-10-CM

## 2020-08-04 DIAGNOSIS — K769 Liver disease, unspecified: Principal | ICD-10-CM

## 2020-08-04 DIAGNOSIS — D7389 Other diseases of spleen: Principal | ICD-10-CM

## 2020-08-04 DIAGNOSIS — I10 Essential (primary) hypertension: Principal | ICD-10-CM

## 2020-08-04 DIAGNOSIS — C8338 Diffuse large B-cell lymphoma, lymph nodes of multiple sites: Principal | ICD-10-CM

## 2020-08-04 DIAGNOSIS — F4310 Post-traumatic stress disorder, unspecified: Principal | ICD-10-CM

## 2020-08-04 DIAGNOSIS — C8599 Non-Hodgkin lymphoma, unspecified, extranodal and solid organ sites: Principal | ICD-10-CM

## 2020-08-04 DIAGNOSIS — Z9049 Acquired absence of other specified parts of digestive tract: Principal | ICD-10-CM

## 2020-08-04 DIAGNOSIS — Z7984 Long term (current) use of oral hypoglycemic drugs: Principal | ICD-10-CM

## 2020-08-04 DIAGNOSIS — F419 Anxiety disorder, unspecified: Principal | ICD-10-CM

## 2020-08-04 DIAGNOSIS — Z931 Gastrostomy status: Principal | ICD-10-CM

## 2020-08-04 MED ORDER — FLUCONAZOLE 100 MG TABLET: 100 mg | tablet | Freq: Every day | 0 refills | 30 days | Status: AC

## 2020-08-04 MED ORDER — LIDOCAINE-DIPHENHYD-AL-MAG-SIM 200 MG-25 MG-400 MG-40MG/30ML MOUTHWASH
2 refills | 0 days | Status: CP
Start: 2020-08-04 — End: 2020-08-04

## 2020-08-04 MED ORDER — DIPHENHYDRAMINE 12.5 MG/5 ML ORAL LIQUID
2 refills | 0 days | Status: CP
Start: 2020-08-04 — End: 2020-08-06
  Filled 2020-08-04: qty 118, 8d supply, fill #0

## 2020-08-04 MED ORDER — LIDOCAINE HCL 2 % MUCOSAL SOLUTION
INTRAMUSCULAR | 2 refills | 0.00000 days | Status: CP
Start: 2020-08-04 — End: 2020-08-06
  Filled 2020-08-04: qty 100, 8d supply, fill #0

## 2020-08-04 MED ORDER — ONDANSETRON HCL 8 MG TABLET
ORAL_TABLET | ORAL | 2 refills | 0.00000 days | Status: CP
Start: 2020-08-04 — End: 2021-08-04
  Filled 2020-08-04: qty 30, 10d supply, fill #0

## 2020-08-04 MED ORDER — PREDNISONE 50 MG TABLET
ORAL_TABLET | ORAL | 2 refills | 0.00000 days | Status: CP
Start: 2020-08-04 — End: 2021-02-01
  Filled 2020-08-04: qty 24, 63d supply, fill #0

## 2020-08-04 MED ORDER — OLANZAPINE 10 MG TABLET
ORAL_TABLET | ORAL | 2 refills | 0.00000 days | Status: CP
Start: 2020-08-04 — End: 2020-08-04

## 2020-08-04 MED ORDER — MAGIC MOUTHWASH (NYSTATIN/DIPHENHYDRAMINE/MYLANTA) WITH LIDOCAINE ORAL MIXTURE
Freq: Four times a day (QID) | ORAL | 0 refills | 0 days | Status: CP | PRN
Start: 2020-08-04 — End: 2020-08-04

## 2020-08-04 MED ORDER — DIAZEPAM 2 MG TABLET
ORAL_TABLET | Freq: Two times a day (BID) | ORAL | 0 refills | 20 days | Status: CP | PRN
Start: 2020-08-04 — End: 2020-09-15

## 2020-08-04 MED ORDER — ALUMINUM-MAG HYDROXIDE-SIMETHICONE 200 MG-200 MG-20 MG/5 ML ORAL SUSP
0 refills | 0 days | Status: CP
Start: 2020-08-04 — End: 2020-08-06
  Filled 2020-08-04: qty 355, 25d supply, fill #0

## 2020-08-04 MED ORDER — FLUCONAZOLE 100 MG TABLET
ORAL_TABLET | Freq: Every day | ORAL | 0 refills | 30.00000 days | Status: CP
Start: 2020-08-04 — End: 2020-09-15
  Filled 2020-08-04: qty 30, 30d supply, fill #0

## 2020-08-04 MED ORDER — PROCHLORPERAZINE MALEATE 10 MG TABLET
ORAL_TABLET | Freq: Four times a day (QID) | ORAL | 2 refills | 8.00000 days | Status: CP | PRN
Start: 2020-08-04 — End: 2020-09-03
  Filled 2020-08-04: qty 30, 8d supply, fill #0

## 2020-08-04 MED FILL — DIPHENHYDRAMINE 12.5 MG/5 ML ORAL LIQUID: 8 days supply | Qty: 118 | Fill #0 | Status: AC

## 2020-08-04 MED FILL — PREDNISONE 50 MG TABLET: 63 days supply | Qty: 24 | Fill #0 | Status: AC

## 2020-08-04 MED FILL — PANTOPRAZOLE 40 MG TABLET,DELAYED RELEASE: 30 days supply | Qty: 30 | Fill #0 | Status: AC

## 2020-08-04 MED FILL — PROCHLORPERAZINE MALEATE 10 MG TABLET: 8 days supply | Qty: 30 | Fill #0 | Status: AC

## 2020-08-04 MED FILL — FLUCONAZOLE 100 MG TABLET: 30 days supply | Qty: 30 | Fill #0 | Status: AC

## 2020-08-04 MED FILL — LIDOCAINE VISCOUS 2 % MUCOSAL SOLUTION: 8 days supply | Qty: 100 | Fill #0 | Status: AC

## 2020-08-04 MED FILL — ONDANSETRON HCL 8 MG TABLET: 10 days supply | Qty: 30 | Fill #0 | Status: AC

## 2020-08-04 MED FILL — ANTACID REGULAR STRENGTH 200 MG-200 MG-20 MG/5 ML ORAL SUSPENSION: 25 days supply | Qty: 355 | Fill #0 | Status: AC

## 2020-08-05 DIAGNOSIS — C8599 Non-Hodgkin lymphoma, unspecified, extranodal and solid organ sites: Principal | ICD-10-CM

## 2020-08-06 ENCOUNTER — Encounter: Admit: 2020-08-06 | Discharge: 2020-08-07 | Payer: MEDICARE

## 2020-08-06 DIAGNOSIS — C8338 Diffuse large B-cell lymphoma, lymph nodes of multiple sites: Principal | ICD-10-CM

## 2020-08-06 DIAGNOSIS — C8599 Non-Hodgkin lymphoma, unspecified, extranodal and solid organ sites: Principal | ICD-10-CM

## 2020-08-07 DIAGNOSIS — E1165 Type 2 diabetes mellitus with hyperglycemia: Principal | ICD-10-CM

## 2020-08-07 DIAGNOSIS — F410 Panic disorder [episodic paroxysmal anxiety] without agoraphobia: Principal | ICD-10-CM

## 2020-08-07 DIAGNOSIS — E1169 Type 2 diabetes mellitus with other specified complication: Principal | ICD-10-CM

## 2020-08-08 ENCOUNTER — Encounter: Admit: 2020-08-08 | Discharge: 2020-08-09 | Payer: MEDICARE

## 2020-08-08 DIAGNOSIS — C8599 Non-Hodgkin lymphoma, unspecified, extranodal and solid organ sites: Principal | ICD-10-CM

## 2020-08-08 DIAGNOSIS — C8330 Diffuse large B-cell lymphoma, unspecified site: Principal | ICD-10-CM

## 2020-08-09 DIAGNOSIS — C8599 Non-Hodgkin lymphoma, unspecified, extranodal and solid organ sites: Principal | ICD-10-CM

## 2020-08-12 ENCOUNTER — Encounter: Admit: 2020-08-12 | Discharge: 2020-08-13 | Payer: MEDICARE

## 2020-08-12 DIAGNOSIS — C8338 Diffuse large B-cell lymphoma, lymph nodes of multiple sites: Principal | ICD-10-CM

## 2020-08-12 DIAGNOSIS — C8599 Non-Hodgkin lymphoma, unspecified, extranodal and solid organ sites: Principal | ICD-10-CM

## 2020-08-13 DIAGNOSIS — C8599 Non-Hodgkin lymphoma, unspecified, extranodal and solid organ sites: Principal | ICD-10-CM

## 2020-08-15 DIAGNOSIS — C8599 Non-Hodgkin lymphoma, unspecified, extranodal and solid organ sites: Principal | ICD-10-CM

## 2020-08-16 ENCOUNTER — Other Ambulatory Visit: Payer: Self-pay

## 2020-08-16 ENCOUNTER — Emergency Department
Admission: EM | Admit: 2020-08-16 | Discharge: 2020-08-16 | Disposition: A | Discharge status: Home / Self Care | Payer: Medicare Other | Attending: Emergency Medicine | Admitting: Emergency Medicine

## 2020-08-16 ENCOUNTER — Emergency Department: Payer: Medicare Other

## 2020-08-16 DIAGNOSIS — Z794 Long term (current) use of insulin: Secondary | ICD-10-CM | POA: Diagnosis not present

## 2020-08-16 DIAGNOSIS — R42 Dizziness and giddiness: Secondary | ICD-10-CM | POA: Diagnosis present

## 2020-08-16 DIAGNOSIS — Z931 Gastrostomy status: Secondary | ICD-10-CM | POA: Insufficient documentation

## 2020-08-16 DIAGNOSIS — Z79899 Other long term (current) drug therapy: Secondary | ICD-10-CM | POA: Insufficient documentation

## 2020-08-16 DIAGNOSIS — I10 Essential (primary) hypertension: Secondary | ICD-10-CM | POA: Insufficient documentation

## 2020-08-16 DIAGNOSIS — E860 Dehydration: Secondary | ICD-10-CM | POA: Insufficient documentation

## 2020-08-16 DIAGNOSIS — E119 Type 2 diabetes mellitus without complications: Secondary | ICD-10-CM | POA: Diagnosis not present

## 2020-08-16 DIAGNOSIS — R000 Tachycardia, unspecified: Secondary | ICD-10-CM | POA: Diagnosis not present

## 2020-08-16 LAB — COMPREHENSIVE METABOLIC PANEL
ALT: 45 U/L — ABNORMAL HIGH (ref 0–44)
AST: 32 U/L (ref 15–41)
Albumin: 3.2 g/dL — ABNORMAL LOW (ref 3.5–5.0)
Alkaline Phosphatase: 228 U/L — ABNORMAL HIGH (ref 38–126)
Anion gap: 10 (ref 5–15)
BUN: 21 mg/dL (ref 8–23)
CO2: 28 mmol/L (ref 22–32)
Calcium: 8.3 mg/dL — ABNORMAL LOW (ref 8.9–10.3)
Chloride: 96 mmol/L — ABNORMAL LOW (ref 98–111)
Creatinine, Ser: 1.26 mg/dL — ABNORMAL HIGH (ref 0.61–1.24)
GFR, Estimated: >60 mL/min (ref >60)
Glucose, Bld: 114 mg/dL — ABNORMAL HIGH (ref 70–99)
Potassium: 3.8 mmol/L (ref 3.5–5.1)
Sodium: 134 mmol/L — ABNORMAL LOW (ref 135–145)
Total Bilirubin: 0.8 mg/dL (ref 0.3–1.2)
Total Protein: 5.4 g/dL — ABNORMAL LOW (ref 6.5–8.1)

## 2020-08-16 LAB — CBC WITH DIFFERENTIAL/PLATELET
Abs Immature Granulocytes: 0.8 10*3/uL — ABNORMAL HIGH (ref 0.00–0.07)
Basophils Absolute: 0.1 10*3/uL (ref 0.0–0.1)
Basophils Relative: 1 %
Eosinophils Absolute: 0.1 10*3/uL (ref 0.0–0.5)
Eosinophils Relative: 1 %
HCT: 30.6 % — ABNORMAL LOW (ref 39.0–52.0)
Hemoglobin: 10 g/dL — ABNORMAL LOW (ref 13.0–17.0)
Immature Granulocytes: 8 %
Lymphocytes Relative: 10 %
Lymphs Abs: 1 10*3/uL (ref 0.7–4.0)
MCH: 28.6 pg (ref 26.0–34.0)
MCHC: 32.7 g/dL (ref 30.0–36.0)
MCV: 87.4 fL (ref 80.0–100.0)
Monocytes Absolute: 0.6 10*3/uL (ref 0.1–1.0)
Monocytes Relative: 6 %
Neutro Abs: 7.6 10*3/uL (ref 1.7–7.7)
Neutrophils Relative %: 74 %
Platelets: 133 10*3/uL — ABNORMAL LOW (ref 150–400)
RBC: 3.5 MIL/uL — ABNORMAL LOW (ref 4.22–5.81)
RDW: 16.9 % — ABNORMAL HIGH (ref 11.5–15.5)
Smear Review: NORMAL
WBC: 10.2 10*3/uL (ref 4.0–10.5)
nRBC: 0.3 % — ABNORMAL HIGH (ref 0.0–0.2)

## 2020-08-16 LAB — FIBRIN DERIVATIVES D-DIMER (ARMC ONLY): Fibrin derivatives D-dimer (ARMC): 760.03 ng/mL (FEU) — ABNORMAL HIGH (ref 0.00–499.00)

## 2020-08-16 LAB — TROPONIN I (HIGH SENSITIVITY): Troponin I (High Sensitivity): 17 ng/L (ref <18)

## 2020-08-16 LAB — MAGNESIUM: Magnesium: 1.3 mg/dL — ABNORMAL LOW (ref 1.7–2.4)

## 2020-08-16 MED ORDER — MAGNESIUM SULFATE 2 GM/50ML IV SOLN
2.0000 g | Freq: Once | INTRAVENOUS | Status: AC
  Administered 2020-08-16: 2 g via INTRAVENOUS
  Filled 2020-08-16: qty 50

## 2020-08-16 MED ORDER — DIATRIZOATE MEGLUMINE & SODIUM 66-10 % PO SOLN
30.0000 mL | Freq: Once | ORAL | Status: AC
  Administered 2020-08-16: 30 mL

## 2020-08-16 MED ORDER — IOHEXOL 350 MG/ML SOLN
75.0000 mL | Freq: Once | INTRAVENOUS | Status: AC | PRN
  Administered 2020-08-16: 75 mL via INTRAVENOUS

## 2020-08-16 MED ORDER — LACTATED RINGERS IV BOLUS
1000.0000 mL | Freq: Once | INTRAVENOUS | Status: AC
  Administered 2020-08-16: 1000 mL via INTRAVENOUS

## 2020-08-16 NOTE — ED Notes (Signed)
Increased Mag GTT to 200 mL/hr per Dr. Tamala Julian.

## 2020-08-16 NOTE — ED Triage Notes (Signed)
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 Pt has gtube for feedings and tonight he accidentally cut the tube.

## 2020-08-16 NOTE — ED Provider Notes (Addendum)
[facility] Emergency Department Provider Note  ____________________________________________   First MD Initiated Contact with Patient 08/16/20 0119     (approximate)  I have reviewed the triage vital signs and the nursing notes.   HISTORY  Chief Complaint Abdominal Pain   HPI [name] is a 64 y.o. male with a past medical history of DM, UC, OSA, chronic venous insufficiency, and lymphoma currently receiving chemotherapy at [facility] and dependent with PEG tube for hydration and nutrition supplementation who presents for assessment of some lightheaded and dizziness that occurred shortly before arrival and occurred while the patient was attempting to clean some bandages around his G-tube but resulted in him accidentally cutting his G-tube.  He was able to remove the entire G-tube including the balloon tip.  He states he was placed 2 months ago at [facility].  He states she has had lightheadedness and episodes of dizziness intermittently since starting chemo several months ago and today's episode was not significantly different.  States he has been eating and drinking and denies any acute chest pain, cough, shortness of breath, abdominal pain, vomiting, diarrhea, dysuria, rash, extremity pain or recent falls or injuries.  He did not actually lose consciousness and states he lower himself to the floor.         Past Medical History:  Diagnosis Date  . Diabetes mellitus without complication [facility])     Patient Active Problem List   Diagnosis Date Noted  . Chronic venous insufficiency 05/10/2020  . Hyponatremia 12/05/2019  . Diarrhea 12/05/2019  . Shingles 12/05/2019  . Dehydration 12/04/2019  . Post traumatic stress disorder (PTSD) 10/07/2018  . Right knee pain 05/27/2018  . Hypoglycemia 05/27/2017  . TBI (traumatic brain injury) ([place]) 04/02/2017  . 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 Essential hypertension 09/04/2016  . Uncontrolled type 2 diabetes  mellitus ([place]) 08/08/2016  . Onychomycosis 09/12/2015  . Panic attack 08/24/2015  . Pouchitis ([place]) 09/01/2014  . Periodic limb movement disorder (PLMD) 10/13/2013  . 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 UC (ulcerative colitis) ([place]) 08/18/2013  . Obstructive sleep apnea 01/29/2012  . Posterior subcapsular polar senile cataract 12/31/2011  . 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 OTHER SURGICAL HISTORY     J pouch 1997, history of ileostomy with reversal    Prior to Admission medications   Medication Sig Start Date End Date Taking? Authorizing Provider  atorvastatin (LIPITOR) 40 MG tablet Take 40 mg by mouth daily. 03/14/17   [provider]  CIALIS 10 MG tablet Take 10 mg by mouth as needed. 03/22/20   [provider]  diazepam (VALIUM) 5 MG tablet Take 5 mg by mouth 2 (two) times daily as needed for anxiety. 10/01/19   [provider]  diphenoxylate-atropine (LOMOTIL) 2.5-0.025 MG tablet Take 1 tablet by mouth 4 (four) times daily as needed for diarrhea or loose stools. 11/02/19   [provider]  DPH-Lido-AlHydr-MgHydr-Simeth (FIRST-MOUTHWASH BLM) SUSP Swish 10 mL by mouth then swallow. Do this 4 (four) times a day as needed. 06/09/20   [provider]  fluticasone (FLONASE) 50 MCG/ACT nasal spray 1 spray into each nostril daily as needed for rhinitis.    [provider]  gabapentin (NEURONTIN) 600 MG tablet Take 0.5 tablets (300 mg total) by mouth at bedtime. Follow with your PCP regarding dose adjustment 12/07/19 01/06/20  Elodia Florence., MD  glipiZIDE (  GLUCOTROL) 10 MG tablet Take 10 mg by mouth daily. Patient not taking: Reported on 05/09/2020 10/07/18   [provider]  HYDROcodone-acetaminophen (NORCO/VICODIN) 5-325 MG tablet Take 1-2 tablets by mouth every 6 (six) hours as needed for moderate pain or severe  pain. Patient not taking: Reported on 05/09/2020 04/05/20 04/05/21  Duffy Bruce, MD  LANTUS SOLOSTAR 100 UNIT/ML Solostar Pen Inject 35 Units into the skin at bedtime. 09/29/19   [provider]  lisinopril (PRINIVIL,ZESTRIL) 5 MG tablet Take 5 mg by mouth daily. 05/23/17   [provider]  magnesium oxide (MAG-OX) 400 MG tablet Take by mouth. 06/09/20   [provider]  metFORMIN (GLUCOPHAGE) 1000 MG tablet Take 1,000 mg by mouth 2 (two) times daily. 04/08/17   [provider]  nystatin (MYCOSTATIN) 100000 UNIT/ML suspension Take by mouth. 06/09/20   [provider]  omeprazole (PRILOSEC) 40 MG capsule Take 40 mg by mouth daily.     [provider]  ondansetron (ZOFRAN) 8 MG tablet Starting the day after chemotherapy on day 6, take one 8 mg tab every 8 hours x 3 days. Then, take one 8 mg tab every 8 hours as needed. 06/09/20   [provider]  pantoprazole (PROTONIX) 40 MG tablet Take 1 tablet (40 mg total) by mouth daily for 14 days. 04/05/20 04/19/20  Duffy Bruce, MD  prochlorperazine (COMPAZINE) 10 MG tablet Take by mouth. 06/09/20 07/09/20  [provider]  venlafaxine XR (EFFEXOR-XR) 150 MG 24 hr capsule Take 150 mg by mouth daily. 03/20/17   [provider]  VICTOZA 18 MG/3ML SOPN Inject 0.6 mg into the skin once a week. 03/22/20   [provider]    Allergies Sulfa antibiotics and Oxycodone  No family history on file.  Social History Social History   Tobacco Use  . Smoking status: Never Smoker  . Smokeless tobacco: Never Used  Substance Use Topics  . Alcohol use: Not on file  . Drug use: Not on file    Review of Systems  Review of Systems  Constitutional: Positive for malaise/fatigue. Negative for chills and fever.  HENT: Negative for sore throat.   Eyes: Negative for pain.  Respiratory: Negative for cough and stridor.   Cardiovascular: Negative for chest pain.  Gastrointestinal: Negative  for vomiting.  Skin: Negative for rash.  Neurological: Positive for dizziness and weakness. Negative for seizures, loss of consciousness and headaches.  Psychiatric/Behavioral: Negative for suicidal ideas.  All other systems reviewed and are negative.     ____________________________________________   PHYSICAL EXAM:  VITAL SIGNS: ED Triage Vitals  Enc Vitals Group     BP 08/16/20 0113 97/64     Pulse Rate 08/16/20 0113 (!) 105     Resp 08/16/20 0113 20     Temp 08/16/20 0113 97.9 F (36.6 C)     Temp Source 08/16/20 0113 Oral     SpO2 08/16/20 0113 100 %     Weight 08/16/20 0114 163 lb (73.9 kg)     Height 08/16/20 0114 5' 10"  (1.778 m)     Head Circumference --      Peak Flow --      Pain Score --      Pain Loc --      Pain Edu? --      Excl. in [place]? --    Vitals:   08/16/20 0113 08/16/20 0342  BP: 97/64 131/82  Pulse: (!) 105 88  Resp: 20 18  Temp: 97.9 F (  36.6 C)   SpO2: 100% 99%   Physical Exam Vitals and nursing note reviewed.  Constitutional:      Appearance: He is well-developed.  HENT:     Head: Normocephalic and atraumatic.     Right Ear: External ear normal.     Left Ear: External ear normal.     Nose: Nose normal.     Mouth/Throat:     Mouth: Mucous membranes are dry.  Eyes:     Conjunctiva/sclera: Conjunctivae normal.  Cardiovascular:     Rate and Rhythm: Regular rhythm. Tachycardia present.     Heart sounds: No murmur heard.   Pulmonary:     Effort: Pulmonary effort is normal. No respiratory distress.     Breath sounds: Normal breath sounds.  Abdominal:     Palpations: Abdomen is soft.     Tenderness: There is no abdominal tenderness.  Musculoskeletal:     Cervical back: Neck supple.     Right lower leg: No edema.     Left lower leg: No edema.  Skin:    General: Skin is warm and dry.     Capillary Refill: Capillary refill takes 2 to 3 seconds.  Neurological:     Mental Status: He is alert and oriented to person, place, and time.      Cranial Nerves: No cranial nerve deficit.  Psychiatric:        Mood and Affect: Mood normal.     Patient has symmetric strength in all extremities.  2+ bilateral radial pulses. ____________________________________________   LABS (all labs ordered are listed, but only abnormal results are displayed)  Labs Reviewed  CBC WITH DIFFERENTIAL/PLATELET - Abnormal; Notable for the following components:      Result Value   RBC 3.50 (*)    Hemoglobin 10.0 (*)    HCT 30.6 (*)    RDW 16.9 (*)    Platelets 133 (*)    nRBC 0.3 (*)    Abs Immature Granulocytes 0.80 (*)    All other components within normal limits  COMPREHENSIVE METABOLIC PANEL - Abnormal; Notable for the following components:   Sodium 134 (*)    Chloride 96 (*)    Glucose, Bld 114 (*)    Creatinine, Ser 1.26 (*)    Calcium 8.3 (*)    Total Protein 5.4 (*)    Albumin 3.2 (*)    ALT 45 (*)    Alkaline Phosphatase 228 (*)    All other components within normal limits  MAGNESIUM - Abnormal; Notable for the following components:   Magnesium 1.3 (*)    All other components within normal limits  FIBRIN DERIVATIVES D-DIMER ([facility] ONLY) - Abnormal; Notable for the following components:   Fibrin derivatives D-dimer ([facility]) 760.03 (*)    All other components within normal limits  TROPONIN I (HIGH SENSITIVITY)   ____________________________________________  EKG  Sinus rhythm [rate of 78, axis deviation, unremarkable, no clear evidence of acute ischemia. ____________________________________________  RADIOLOGY  ED MD interpretation: Chest x-ray shows no evidence of overt edema, focal consolidation, pneumothorax, large effusion or other acute intrathoracic process.  KUB shows instilled contrast in the stomach and PEG in appropriate position.  Official radiology report(s): DG Chest 2 View  Result Date: 08/16/2020 CLINICAL DATA:  Presyncope.  Post replacement of gastrostomy tube. EXAM: CHEST - 2 VIEW COMPARISON:  None. FINDINGS:  Power port type central venous catheter with tip over the cavoatrial junction region. Heart size and pulmonary vascularity are normal. Lungs are clear. No pleural  effusions. No pneumothorax. Mediastinal contours appear intact. Postoperative changes in the cervical spine. IMPRESSION: No active cardiopulmonary disease. Electronically Signed   By: Lucienne Capers M.D.   On: 08/16/2020 02:35   DG Abdomen 1 View  Result Date: 08/16/2020 CLINICAL DATA:  Post gastrostomy tube replacement. Gastrografin was instilled through the tube prior to imaging. History of lymphoma on chemotherapy. EXAM: ABDOMEN - 1 VIEW COMPARISON:  None. FINDINGS: Gastrostomy tube demonstrated along the greater curvature of the stomach. Contrast material is demonstrated in the stomach and small bowel without extravasation consistent with appropriate placement of the tube within the stomach. IMPRESSION: Gastrostomy tube appears to be appropriately positioned within the stomach. Electronically Signed   By: Lucienne Capers M.D.   On: 08/16/2020 02:36   CT Angio Chest PE W and/or Wo Contrast  Result Date: 08/16/2020 CLINICAL DATA:  Syncope tonight after accidentally cutting gastrostomy tube. Currently on chemotherapy for lymphoma. Pulmonary embolus is suspected with high probability. EXAM: CT ANGIOGRAPHY CHEST WITH CONTRAST TECHNIQUE: Multidetector CT imaging of the chest was performed using the standard protocol during bolus administration of intravenous contrast. Multiplanar CT image reconstructions and MIPs were obtained to evaluate the vascular anatomy. CONTRAST:  21m OMNIPAQUE IOHEXOL 350 MG/ML SOLN COMPARISON:  07/27/2020 FINDINGS: Cardiovascular: Good opacification of the central and segmental pulmonary arteries. No focal filling defects. No evidence of significant pulmonary embolus. Normal heart size. Normal caliber thoracic aorta. No aortic dissection. Great vessel origins are patent. Mediastinum/Nodes: Esophagus is decompressed. No  significant lymphadenopathy in the chest. Right central venous catheter with tip in the SVC. Lungs/Pleura: Lungs are clear. No pleural effusions. No pneumothorax. Airways are patent. Upper Abdomen: Low-attenuation lesion in the dome of the liver measures 3.2 cm diameter. Appearance suggests a solid lesion, possibly metastatic or related to the patient's history of lymphoma. No change in appearance since previous study. Musculoskeletal: Postoperative changes in the cervical spine. Degenerative change in the thoracic spine. No destructive bone lesions. Review of the MIP images confirms the above findings. IMPRESSION: 1. No evidence of significant pulmonary embolus. 2. No evidence of active pulmonary disease. 3. 3.2 cm diameter solid lesion in the dome of the liver. Appearance suggests a solid lesion, possibly metastatic or related to the patient's history of lymphoma. No change since previous study. Electronically Signed   By: WLucienne CapersM.D.   On: 08/16/2020 03:38    ____________________________________________   PROCEDURES  Procedure(s) performed (including Critical Care):  FEEDING TUBE REPLACEMENT  Date/Time: 08/16/2020 2:07 AM Performed by: SLucrezia Starch MD Authorized by: SLucrezia Starch MD  Indications: tube removed by patient Local anesthesia used: no  Anesthesia: Local anesthesia used: no  Sedation: Patient sedated: no  Tube type: gastrostomy Patient position: sitting Procedure type: replacement Tube size: 14 Fr Endoscope used: no Bulb inflation volume: 5 (ml) Bulb inflation fluid: normal saline Placement/position confirmation: x-ray Patient tolerance: patient tolerated the procedure well with no immediate complications  .1-3 Lead EKG Interpretation Performed by: SLucrezia Starch MD Authorized by: SLucrezia Starch MD     Interpretation: normal     ECG rate assessment: tachycardic     Rhythm: sinus rhythm     Ectopy: none     Conduction: normal        ____________________________________________   INITIAL IMPRESSION / ASSESSMENT AND PLAN / ED COURSE        Patient presents with [place] history exam for 2 separately related issues.  On arrival he is afebrile but slightly tachycardic.  First  is G-tube is dislodged after he accidentally cut it while changing the dressing.  He states he was attempting to cut the bandages around it.  This G-tube was replaced without difficulty as noted above.  Second patient states he is had some lightheadedness and dizziness resulted in him low himself to the floor to rest.  While seems this is not particularly acute today he is a little bit tachycardic.  Unclear etiology although differential includes side effect from chemotherapy versus ACS versus arrhythmia versus PE versus acute anemia versus metabolic derangements.  Patient states he has been drinking adequately but may also be slightly dehydrated given he has decreased cap refill and dry mucous membranes and he was given some IV fluids.  Low suspicion for PE has not seen a CTA chest that was obtained due to elevated D-dimer.  Impression is mild dehydration.  Patient given IV fluids and magnesium as noted below.  On reassessment vital signs have resolution of tachycardia and stated he never felt lightheaded.  Given otherwise reassuring exam vital signs and work-up believe he is safe for discharge with close outpatient PCP follow-up.  Discharged able condition per strict return precautions advised and discussed.    ____________________________________________   FINAL CLINICAL IMPRESSION(S) / ED DIAGNOSES  Final diagnoses:  Lightheaded  Dizzy  Hypomagnesemia  Dehydration  S/P percutaneous endoscopic gastrostomy (PEG) tube placement (HCC)    Medications  magnesium sulfate IVPB 2 g 50 mL (2 g Intravenous New Bag/Given 08/16/20 0326)  lactated ringers bolus 1,000 mL (1,000 mLs Intravenous New Bag/Given 08/16/20 0230)  diatrizoate  meglumine-sodium (GASTROGRAFIN) 66-10 % solution 30 mL (30 mLs Per Tube Given 08/16/20 0224)  iohexol (OMNIPAQUE) 350 MG/ML injection 75 mL (75 mLs Intravenous Contrast Given 08/16/20 0300)     ED Discharge Orders    None       Note:  This document was prepared using Dragon voice recognition software and may include unintentional dictation errors.   Lucrezia Starch, MD 08/16/20 0762    Lucrezia Starch, MD 08/16/20 [phone]

## 2020-08-24 ENCOUNTER — Encounter: Admit: 2020-08-24 | Discharge: 2020-08-25 | Payer: MEDICARE

## 2020-08-24 ENCOUNTER — Ambulatory Visit: Admit: 2020-08-24 | Discharge: 2020-08-25 | Payer: MEDICARE

## 2020-08-24 DIAGNOSIS — C8338 Diffuse large B-cell lymphoma, lymph nodes of multiple sites: Principal | ICD-10-CM

## 2020-08-24 DIAGNOSIS — C8599 Non-Hodgkin lymphoma, unspecified, extranodal and solid organ sites: Principal | ICD-10-CM

## 2020-08-25 ENCOUNTER — Encounter: Admit: 2020-08-25 | Discharge: 2020-08-26 | Payer: MEDICARE

## 2020-08-25 DIAGNOSIS — Z85038 Personal history of other malignant neoplasm of large intestine: Principal | ICD-10-CM

## 2020-08-25 DIAGNOSIS — C8599 Non-Hodgkin lymphoma, unspecified, extranodal and solid organ sites: Principal | ICD-10-CM

## 2020-08-25 DIAGNOSIS — D7389 Other diseases of spleen: Principal | ICD-10-CM

## 2020-08-25 DIAGNOSIS — Z9221 Personal history of antineoplastic chemotherapy: Principal | ICD-10-CM

## 2020-08-25 DIAGNOSIS — C8338 Diffuse large B-cell lymphoma, lymph nodes of multiple sites: Principal | ICD-10-CM

## 2020-08-25 DIAGNOSIS — E119 Type 2 diabetes mellitus without complications: Principal | ICD-10-CM

## 2020-08-25 DIAGNOSIS — C8517 Unspecified B-cell lymphoma, spleen: Principal | ICD-10-CM

## 2020-08-25 DIAGNOSIS — F4310 Post-traumatic stress disorder, unspecified: Principal | ICD-10-CM

## 2020-08-25 DIAGNOSIS — Z931 Gastrostomy status: Principal | ICD-10-CM

## 2020-08-25 DIAGNOSIS — F419 Anxiety disorder, unspecified: Principal | ICD-10-CM

## 2020-08-25 DIAGNOSIS — K219 Gastro-esophageal reflux disease without esophagitis: Principal | ICD-10-CM

## 2020-08-25 DIAGNOSIS — K529 Noninfective gastroenteritis and colitis, unspecified: Principal | ICD-10-CM

## 2020-08-25 DIAGNOSIS — Z79899 Other long term (current) drug therapy: Principal | ICD-10-CM

## 2020-08-25 DIAGNOSIS — K5190 Ulcerative colitis, unspecified, without complications: Principal | ICD-10-CM

## 2020-08-25 DIAGNOSIS — Z9049 Acquired absence of other specified parts of digestive tract: Principal | ICD-10-CM

## 2020-08-25 DIAGNOSIS — K769 Liver disease, unspecified: Principal | ICD-10-CM

## 2020-08-25 DIAGNOSIS — Z7984 Long term (current) use of oral hypoglycemic drugs: Principal | ICD-10-CM

## 2020-08-25 DIAGNOSIS — I10 Essential (primary) hypertension: Principal | ICD-10-CM

## 2020-08-25 DIAGNOSIS — R350 Frequency of micturition: Principal | ICD-10-CM

## 2020-08-26 DIAGNOSIS — C8338 Diffuse large B-cell lymphoma, lymph nodes of multiple sites: Principal | ICD-10-CM

## 2020-08-26 DIAGNOSIS — C8599 Non-Hodgkin lymphoma, unspecified, extranodal and solid organ sites: Principal | ICD-10-CM

## 2020-08-29 ENCOUNTER — Encounter: Admit: 2020-08-29 | Discharge: 2020-08-30 | Payer: MEDICARE

## 2020-08-29 ENCOUNTER — Ambulatory Visit: Admit: 2020-08-29 | Discharge: 2020-08-30 | Payer: MEDICARE

## 2020-08-29 DIAGNOSIS — C8599 Non-Hodgkin lymphoma, unspecified, extranodal and solid organ sites: Principal | ICD-10-CM

## 2020-08-29 DIAGNOSIS — C8338 Diffuse large B-cell lymphoma, lymph nodes of multiple sites: Principal | ICD-10-CM

## 2020-08-30 DIAGNOSIS — C8599 Non-Hodgkin lymphoma, unspecified, extranodal and solid organ sites: Principal | ICD-10-CM

## 2020-08-31 ENCOUNTER — Institutional Professional Consult (permissible substitution): Admit: 2020-08-31 | Discharge: 2020-09-01 | Payer: MEDICARE

## 2020-08-31 DIAGNOSIS — C8338 Diffuse large B-cell lymphoma, lymph nodes of multiple sites: Principal | ICD-10-CM

## 2020-09-04 DIAGNOSIS — C8599 Non-Hodgkin lymphoma, unspecified, extranodal and solid organ sites: Principal | ICD-10-CM

## 2020-09-08 DIAGNOSIS — C8338 Diffuse large B-cell lymphoma, lymph nodes of multiple sites: Principal | ICD-10-CM

## 2020-09-09 ENCOUNTER — Encounter: Admit: 2020-09-09 | Discharge: 2020-09-10 | Payer: MEDICARE

## 2020-09-09 DIAGNOSIS — Z431 Encounter for attention to gastrostomy: Principal | ICD-10-CM

## 2020-09-09 DIAGNOSIS — E1169 Type 2 diabetes mellitus with other specified complication: Principal | ICD-10-CM

## 2020-09-09 DIAGNOSIS — C8338 Diffuse large B-cell lymphoma, lymph nodes of multiple sites: Principal | ICD-10-CM

## 2020-09-09 MED ORDER — METFORMIN 500 MG TABLET
ORAL_TABLET | Freq: Two times a day (BID) | ORAL | 0 refills | 30 days | Status: CP
Start: 2020-09-09 — End: ?
  Filled 2020-09-09: qty 60, 30d supply, fill #0

## 2020-09-09 MED FILL — METFORMIN 500 MG TABLET: 30 days supply | Qty: 60 | Fill #0 | Status: AC

## 2020-09-14 DIAGNOSIS — C8599 Non-Hodgkin lymphoma, unspecified, extranodal and solid organ sites: Principal | ICD-10-CM

## 2020-09-15 ENCOUNTER — Encounter: Admit: 2020-09-15 | Discharge: 2020-09-16 | Payer: MEDICARE

## 2020-09-15 ENCOUNTER — Ambulatory Visit: Admit: 2020-09-15 | Discharge: 2020-09-16 | Payer: MEDICARE

## 2020-09-15 DIAGNOSIS — F419 Anxiety disorder, unspecified: Principal | ICD-10-CM

## 2020-09-15 DIAGNOSIS — C8517 Unspecified B-cell lymphoma, spleen: Principal | ICD-10-CM

## 2020-09-15 DIAGNOSIS — D61818 Other pancytopenia: Principal | ICD-10-CM

## 2020-09-15 DIAGNOSIS — C8338 Diffuse large B-cell lymphoma, lymph nodes of multiple sites: Principal | ICD-10-CM

## 2020-09-15 DIAGNOSIS — E119 Type 2 diabetes mellitus without complications: Principal | ICD-10-CM

## 2020-09-15 DIAGNOSIS — F32A Depression, unspecified: Principal | ICD-10-CM

## 2020-09-15 DIAGNOSIS — K219 Gastro-esophageal reflux disease without esophagitis: Principal | ICD-10-CM

## 2020-09-15 DIAGNOSIS — N179 Acute kidney failure, unspecified: Principal | ICD-10-CM

## 2020-09-15 DIAGNOSIS — Z7984 Long term (current) use of oral hypoglycemic drugs: Principal | ICD-10-CM

## 2020-09-15 DIAGNOSIS — Z931 Gastrostomy status: Principal | ICD-10-CM

## 2020-09-15 DIAGNOSIS — C8599 Non-Hodgkin lymphoma, unspecified, extranodal and solid organ sites: Principal | ICD-10-CM

## 2020-09-15 DIAGNOSIS — Z79899 Other long term (current) drug therapy: Principal | ICD-10-CM

## 2020-09-15 DIAGNOSIS — Z9049 Acquired absence of other specified parts of digestive tract: Principal | ICD-10-CM

## 2020-09-15 DIAGNOSIS — D7389 Other diseases of spleen: Principal | ICD-10-CM

## 2020-09-15 DIAGNOSIS — F4310 Post-traumatic stress disorder, unspecified: Principal | ICD-10-CM

## 2020-09-15 DIAGNOSIS — I10 Essential (primary) hypertension: Principal | ICD-10-CM

## 2020-09-15 DIAGNOSIS — Z85038 Personal history of other malignant neoplasm of large intestine: Principal | ICD-10-CM

## 2020-09-15 DIAGNOSIS — K529 Noninfective gastroenteritis and colitis, unspecified: Principal | ICD-10-CM

## 2020-09-15 DIAGNOSIS — K5190 Ulcerative colitis, unspecified, without complications: Principal | ICD-10-CM

## 2020-09-15 DIAGNOSIS — Z9221 Personal history of antineoplastic chemotherapy: Principal | ICD-10-CM

## 2020-09-15 DIAGNOSIS — K769 Liver disease, unspecified: Principal | ICD-10-CM

## 2020-09-15 MED ORDER — GABAPENTIN 100 MG CAPSULE
ORAL_CAPSULE | Freq: Two times a day (BID) | ORAL | 0 refills | 68 days | Status: CP
Start: 2020-09-15 — End: 2020-11-22
  Filled 2020-09-15: qty 270, 68d supply, fill #0

## 2020-09-15 MED ORDER — OXYCODONE 5 MG TABLET
ORAL_TABLET | ORAL | 0 refills | 5 days | Status: CP | PRN
Start: 2020-09-15 — End: ?
  Filled 2020-09-15: qty 25, 5d supply, fill #0

## 2020-09-15 MED ORDER — DIAZEPAM 2 MG TABLET
ORAL_TABLET | Freq: Two times a day (BID) | ORAL | 0 refills | 20 days | Status: CP | PRN
Start: 2020-09-15 — End: ?
  Filled 2020-09-15: qty 20, 20d supply, fill #0

## 2020-09-15 MED FILL — OXYCODONE 5 MG TABLET: 5 days supply | Qty: 25 | Fill #0 | Status: AC

## 2020-09-15 MED FILL — GABAPENTIN 100 MG CAPSULE: 68 days supply | Qty: 270 | Fill #0 | Status: AC

## 2020-09-15 MED FILL — DIAZEPAM 2 MG TABLET: 20 days supply | Qty: 20 | Fill #0 | Status: AC

## 2020-09-19 DIAGNOSIS — C8599 Non-Hodgkin lymphoma, unspecified, extranodal and solid organ sites: Principal | ICD-10-CM

## 2020-09-19 MED ORDER — OMEPRAZOLE MAGNESIUM 20 MG TABLET,DELAYED RELEASE
ORAL_TABLET | Freq: Every day | ORAL | 1 refills | 28 days | Status: CP
Start: 2020-09-19 — End: 2021-09-19

## 2020-09-19 MED ORDER — VALACYCLOVIR 500 MG TABLET: 500 mg | tablet | Freq: Every day | 2 refills | 30 days | Status: AC

## 2020-09-19 MED ORDER — VALACYCLOVIR 500 MG TABLET
ORAL_TABLET | Freq: Every day | ORAL | 2 refills | 30.00000 days | Status: CP
Start: 2020-09-19 — End: 2020-09-19

## 2020-09-19 MED ORDER — DIPHENOXYLATE-ATROPINE 2.5 MG-0.025 MG TABLET: 1 | tablet | Freq: Two times a day (BID) | 0 refills | 10 days | Status: AC

## 2020-09-19 MED ORDER — DIPHENOXYLATE-ATROPINE 2.5 MG-0.025 MG TABLET
ORAL_TABLET | Freq: Two times a day (BID) | ORAL | 0 refills | 10.00000 days | Status: CP | PRN
Start: 2020-09-19 — End: 2020-09-19

## 2020-09-19 MED ORDER — MIDODRINE 5 MG TABLET: 5 mg | tablet | Freq: Three times a day (TID) | 0 refills | 30 days | Status: AC

## 2020-09-19 MED ORDER — MIDODRINE 5 MG TABLET
ORAL_TABLET | Freq: Three times a day (TID) | ORAL | 0 refills | 30.00000 days | Status: CP
Start: 2020-09-19 — End: 2020-09-19

## 2020-09-25 DIAGNOSIS — C8599 Non-Hodgkin lymphoma, unspecified, extranodal and solid organ sites: Principal | ICD-10-CM

## 2020-09-26 DIAGNOSIS — C8599 Non-Hodgkin lymphoma, unspecified, extranodal and solid organ sites: Principal | ICD-10-CM

## 2020-10-03 DIAGNOSIS — C8599 Non-Hodgkin lymphoma, unspecified, extranodal and solid organ sites: Principal | ICD-10-CM

## 2020-10-06 ENCOUNTER — Ambulatory Visit: Admit: 2020-10-06 | Discharge: 2020-10-07 | Payer: MEDICARE

## 2020-10-06 ENCOUNTER — Encounter: Admit: 2020-10-06 | Discharge: 2020-10-07 | Payer: MEDICARE

## 2020-10-06 DIAGNOSIS — M25561 Pain in right knee: Principal | ICD-10-CM

## 2020-10-06 DIAGNOSIS — C8517 Unspecified B-cell lymphoma, spleen: Principal | ICD-10-CM

## 2020-10-06 DIAGNOSIS — Z79899 Other long term (current) drug therapy: Principal | ICD-10-CM

## 2020-10-06 DIAGNOSIS — Z931 Gastrostomy status: Principal | ICD-10-CM

## 2020-10-06 DIAGNOSIS — K529 Noninfective gastroenteritis and colitis, unspecified: Principal | ICD-10-CM

## 2020-10-06 DIAGNOSIS — L089 Local infection of the skin and subcutaneous tissue, unspecified: Principal | ICD-10-CM

## 2020-10-06 DIAGNOSIS — C8338 Diffuse large B-cell lymphoma, lymph nodes of multiple sites: Principal | ICD-10-CM

## 2020-10-06 DIAGNOSIS — Z5111 Encounter for antineoplastic chemotherapy: Principal | ICD-10-CM

## 2020-10-06 DIAGNOSIS — E1140 Type 2 diabetes mellitus with diabetic neuropathy, unspecified: Principal | ICD-10-CM

## 2020-10-06 DIAGNOSIS — K769 Liver disease, unspecified: Principal | ICD-10-CM

## 2020-10-06 DIAGNOSIS — F4310 Post-traumatic stress disorder, unspecified: Principal | ICD-10-CM

## 2020-10-06 DIAGNOSIS — I10 Essential (primary) hypertension: Principal | ICD-10-CM

## 2020-10-06 DIAGNOSIS — Z9221 Personal history of antineoplastic chemotherapy: Principal | ICD-10-CM

## 2020-10-06 DIAGNOSIS — C8599 Non-Hodgkin lymphoma, unspecified, extranodal and solid organ sites: Principal | ICD-10-CM

## 2020-10-06 DIAGNOSIS — Z794 Long term (current) use of insulin: Principal | ICD-10-CM

## 2020-10-06 DIAGNOSIS — D7389 Other diseases of spleen: Principal | ICD-10-CM

## 2020-10-06 MED ORDER — MUPIROCIN 2 % TOPICAL OINTMENT
Freq: Three times a day (TID) | TOPICAL | 0 refills | 7.00000 days | Status: CP
Start: 2020-10-06 — End: 2020-10-27

## 2020-10-06 MED ORDER — DIPHENOXYLATE-ATROPINE 2.5 MG-0.025 MG TABLET
ORAL_TABLET | Freq: Two times a day (BID) | ORAL | 0 refills | 30.00000 days | Status: CP | PRN
Start: 2020-10-06 — End: ?

## 2020-10-07 DIAGNOSIS — C8599 Non-Hodgkin lymphoma, unspecified, extranodal and solid organ sites: Principal | ICD-10-CM

## 2020-10-20 ENCOUNTER — Emergency Department
Admission: EM | Admit: 2020-10-20 | Discharge: 2020-10-20 | Disposition: A | Discharge status: Home / Self Care | Payer: Medicare Other | Attending: Emergency Medicine | Admitting: Emergency Medicine

## 2020-10-20 ENCOUNTER — Encounter: Payer: Self-pay | Admitting: Emergency Medicine

## 2020-10-20 ENCOUNTER — Emergency Department: Payer: Medicare Other

## 2020-10-20 ENCOUNTER — Other Ambulatory Visit: Payer: Self-pay

## 2020-10-20 DIAGNOSIS — Y9301 Activity, walking, marching and hiking: Secondary | ICD-10-CM | POA: Diagnosis not present

## 2020-10-20 DIAGNOSIS — R059 Cough, unspecified: Secondary | ICD-10-CM | POA: Diagnosis present

## 2020-10-20 DIAGNOSIS — R600 Localized edema: Secondary | ICD-10-CM | POA: Diagnosis not present

## 2020-10-20 DIAGNOSIS — M25511 Pain in right shoulder: Secondary | ICD-10-CM | POA: Insufficient documentation

## 2020-10-20 DIAGNOSIS — W1789XA Other fall from one level to another, initial encounter: Secondary | ICD-10-CM | POA: Insufficient documentation

## 2020-10-20 DIAGNOSIS — M25512 Pain in left shoulder: Secondary | ICD-10-CM | POA: Insufficient documentation

## 2020-10-20 DIAGNOSIS — Z20822 Contact with and (suspected) exposure to covid-19: Secondary | ICD-10-CM | POA: Diagnosis not present

## 2020-10-20 DIAGNOSIS — Y92009 Unspecified place in unspecified non-institutional (private) residence as the place of occurrence of the external cause: Secondary | ICD-10-CM | POA: Insufficient documentation

## 2020-10-20 DIAGNOSIS — W19XXXA Unspecified fall, initial encounter: Secondary | ICD-10-CM

## 2020-10-20 DIAGNOSIS — D649 Anemia, unspecified: Secondary | ICD-10-CM

## 2020-10-20 DIAGNOSIS — R918 Other nonspecific abnormal finding of lung field: Secondary | ICD-10-CM | POA: Diagnosis not present

## 2020-10-20 LAB — CBC WITH DIFFERENTIAL/PLATELET
Abs Immature Granulocytes: 0.12 10*3/uL — ABNORMAL HIGH (ref 0.00–0.07)
Basophils Absolute: 0 10*3/uL (ref 0.0–0.1)
Basophils Relative: 1 %
Eosinophils Absolute: 0.1 10*3/uL (ref 0.0–0.5)
Eosinophils Relative: 3 %
HCT: 25.5 % — ABNORMAL LOW (ref 39.0–52.0)
Hemoglobin: 8.5 g/dL — ABNORMAL LOW (ref 13.0–17.0)
Immature Granulocytes: 3 %
Lymphocytes Relative: 15 %
Lymphs Abs: 0.6 10*3/uL — ABNORMAL LOW (ref 0.7–4.0)
MCH: 27.9 pg (ref 26.0–34.0)
MCHC: 33.3 g/dL (ref 30.0–36.0)
MCV: 83.6 fL (ref 80.0–100.0)
Monocytes Absolute: 0.5 10*3/uL (ref 0.1–1.0)
Monocytes Relative: 13 %
Neutro Abs: 2.8 10*3/uL (ref 1.7–7.7)
Neutrophils Relative %: 65 %
Platelets: 138 10*3/uL — ABNORMAL LOW (ref 150–400)
RBC: 3.05 MIL/uL — ABNORMAL LOW (ref 4.22–5.81)
RDW: 16 % — ABNORMAL HIGH (ref 11.5–15.5)
WBC: 4.2 10*3/uL (ref 4.0–10.5)
nRBC: 0 % (ref 0.0–0.2)

## 2020-10-20 LAB — COMPREHENSIVE METABOLIC PANEL
ALT: 17 U/L (ref 0–44)
AST: 25 U/L (ref 15–41)
Albumin: 2.9 g/dL — ABNORMAL LOW (ref 3.5–5.0)
Alkaline Phosphatase: 314 U/L — ABNORMAL HIGH (ref 38–126)
Anion gap: 8 (ref 5–15)
BUN: 15 mg/dL (ref 8–23)
CO2: 25 mmol/L (ref 22–32)
Calcium: 8 mg/dL — ABNORMAL LOW (ref 8.9–10.3)
Chloride: 101 mmol/L (ref 98–111)
Creatinine, Ser: 0.92 mg/dL (ref 0.61–1.24)
GFR, Estimated: >60 mL/min (ref >60)
Glucose, Bld: 313 mg/dL — ABNORMAL HIGH (ref 70–99)
Potassium: 4.9 mmol/L (ref 3.5–5.1)
Sodium: 134 mmol/L — ABNORMAL LOW (ref 135–145)
Total Bilirubin: 0.3 mg/dL (ref 0.3–1.2)
Total Protein: 5.2 g/dL — ABNORMAL LOW (ref 6.5–8.1)

## 2020-10-20 LAB — FIBRIN DERIVATIVES D-DIMER (ARMC ONLY): Fibrin derivatives D-dimer (ARMC): 1121.33 ng/mL (FEU) — ABNORMAL HIGH (ref 0.00–499.00)

## 2020-10-20 LAB — SARS CORONAVIRUS 2 (TAT 6-24 HRS): SARS Coronavirus 2: POSITIVE — AB

## 2020-10-20 LAB — TROPONIN I (HIGH SENSITIVITY)
Troponin I (High Sensitivity): 26 ng/L — ABNORMAL HIGH (ref <18)
Troponin I (High Sensitivity): 27 ng/L — ABNORMAL HIGH (ref <18)

## 2020-10-20 MED ORDER — AZITHROMYCIN 250 MG PO TABS: ORAL_TABLET | ORAL | 0 refills | Status: AC

## 2020-10-20 MED ORDER — IOHEXOL 350 MG/ML SOLN
75.0000 mL | Freq: Once | INTRAVENOUS | Status: AC | PRN
  Administered 2020-10-20: 75 mL via INTRAVENOUS
  Filled 2020-10-20: qty 75

## 2020-10-20 MED ORDER — IBUPROFEN 800 MG PO TABS
800.0000 mg | ORAL_TABLET | Freq: Once | ORAL | Status: AC
  Administered 2020-10-20: 800 mg via ORAL
  Filled 2020-10-20: qty 1

## 2020-10-20 NOTE — Discharge Instructions (Addendum)
The chest CT did not show any blood clots however there is a new mass in the right upper lobe which was not there previously.  Please follow-up with your cancer doctor for that as a mass may be a new metastasis or cancer.  Additionally you are somewhat anemic.  This is a little bit worse than before.  Let your doctors know about that as well.  If you have any black or tarry looking stool please return here.  I am going to give you some Zithromax for the cough that you are having since the sputum is green.  Please also return for fever shortness of breath or any other problems.

## 2020-10-20 NOTE — ED Triage Notes (Signed)
Pt comes into the ED via POV c/o cough and a fall yesterday.  Pt states he was going down the ramp at his house and lost his balance and fell.  Pt is c/o bilateral shoulder pain, but mostly the right shoulder.  Pt states his cough has been present for about a week.  Pt in NAd at this time with even and unlabored respirations and is ambulatory to triage.

## 2020-10-20 NOTE — ED Provider Notes (Signed)
[facility] Emergency Department Provider Note   ____________________________________________   Event Date/Time   First MD Initiated Contact with Patient 10/20/20 1228     (approximate)  I have reviewed the triage vital signs and the nursing notes.   HISTORY  Chief Complaint Fall and Cough    HPI [name] is a 65 y.o. male who was walking down the ramp at home and fell.  He says he remembers the fall but does not remember the whole fall.  He did not hit his head.  He does not have a headache or neck pain.  He does complain of pain in both shoulders but the right one is quite tender and he has difficulty elevating it.  Shoulder pain came on after the fall.  He also complains of a cough for about a week.  Has been fairly annoying.  Is a dry cough.  He is not running a fever.  Cough is not productive.  He does not really feel short of breath.  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  Patient reports he has had the Covid vaccination and booster 3 shots total.         Past Medical History:  Diagnosis Date  . Diabetes mellitus without complication [facility])     Patient Active Problem List   Diagnosis Date Noted  . Chronic venous insufficiency 05/10/2020  . Hyponatremia 12/05/2019  . Diarrhea 12/05/2019  . Shingles 12/05/2019  . Dehydration 12/04/2019  . Post traumatic stress disorder (PTSD) 10/07/2018  . Right knee pain 05/27/2018  . Hypoglycemia 05/27/2017  . TBI (traumatic brain injury) ([place]) 04/02/2017  . 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 Essential hypertension 09/04/2016  . Uncontrolled type 2 diabetes mellitus ([place]) 08/08/2016  . Onychomycosis 09/12/2015  . Panic attack 08/24/2015  . Pouchitis ([place]) 09/01/2014  . Periodic limb movement disorder (PLMD) 10/13/2013  . 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 UC (ulcerative colitis) ([place]) 08/18/2013  . Obstructive sleep apnea 01/29/2012  .  Posterior subcapsular polar senile cataract 12/31/2011  . 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 OTHER SURGICAL HISTORY     J pouch 1997, history of ileostomy with reversal    Prior to Admission medications   Medication Sig Start Date End Date Taking? Authorizing Provider  azithromycin (ZITHROMAX Z-PAK) 250 MG tablet Take 2 tablets (500 mg) on  Day 1,  followed by 1 tablet (250 mg) once daily on Days 2 through 5. 10/20/20 10/25/20 Yes Nena Polio, MD  atorvastatin (LIPITOR) 40 MG tablet Take 40 mg by mouth daily. 03/14/17   [provider]  CIALIS 10 MG tablet Take 10 mg by mouth as needed. 03/22/20   [provider]  diazepam (VALIUM) 5 MG tablet Take 5 mg by mouth 2 (two) times daily as needed for anxiety. 10/01/19   [provider]  diphenoxylate-atropine (LOMOTIL) 2.5-0.025 MG tablet Take 1 tablet by mouth 4 (four) times daily as needed for diarrhea or loose stools. 11/02/19   [provider]  DPH-Lido-AlHydr-MgHydr-Simeth (FIRST-MOUTHWASH BLM) SUSP Swish 10 mL by mouth then swallow. Do this 4 (four) times a day as needed. 06/09/20   [provider]  fluticasone (FLONASE) 50 MCG/ACT nasal spray 1 spray into each nostril daily as needed for rhinitis.    [provider]  gabapentin (NEURONTIN) 600 MG tablet Take 0.5 tablets (300 mg  total) by mouth at bedtime. Follow with your PCP regarding dose adjustment 12/07/19 01/06/20  Elodia Florence., MD  glipiZIDE (GLUCOTROL) 10 MG tablet Take 10 mg by mouth daily. Patient not taking: Reported on 05/09/2020 10/07/18   [provider]  HYDROcodone-acetaminophen (NORCO/VICODIN) 5-325 MG tablet Take 1-2 tablets by mouth every 6 (six) hours as needed for moderate pain or severe pain. Patient not taking: Reported on 05/09/2020 04/05/20 04/05/21  Duffy Bruce, MD  LANTUS SOLOSTAR 100 UNIT/ML Solostar Pen Inject 35 Units into  the skin at bedtime. 09/29/19   [provider]  lisinopril (PRINIVIL,ZESTRIL) 5 MG tablet Take 5 mg by mouth daily. 05/23/17   [provider]  magnesium oxide (MAG-OX) 400 MG tablet Take by mouth. 06/09/20   [provider]  metFORMIN (GLUCOPHAGE) 1000 MG tablet Take 1,000 mg by mouth 2 (two) times daily. 04/08/17   [provider]  nystatin (MYCOSTATIN) 100000 UNIT/ML suspension Take by mouth. 06/09/20   [provider]  omeprazole (PRILOSEC) 40 MG capsule Take 40 mg by mouth daily.     [provider]  ondansetron (ZOFRAN) 8 MG tablet Starting the day after chemotherapy on day 6, take one 8 mg tab every 8 hours x 3 days. Then, take one 8 mg tab every 8 hours as needed. 06/09/20   [provider]  pantoprazole (PROTONIX) 40 MG tablet Take 1 tablet (40 mg total) by mouth daily for 14 days. 04/05/20 04/19/20  Duffy Bruce, MD  prochlorperazine (COMPAZINE) 10 MG tablet Take by mouth. 06/09/20 07/09/20  [provider]  venlafaxine XR (EFFEXOR-XR) 150 MG 24 hr capsule Take 150 mg by mouth daily. 03/20/17   [provider]  VICTOZA 18 MG/3ML SOPN Inject 0.6 mg into the skin once a week. 03/22/20   [provider]    Allergies Sulfa antibiotics and Oxycodone  History reviewed. No pertinent family history.  Social History Social History   Tobacco Use  . Smoking status: Never Smoker  . Smokeless tobacco: Never Used    Review of Systems  Constitutional: No fever/chills Eyes: No visual changes. ENT: No sore throat. Cardiovascular: Denies chest pain. Respiratory: Denies shortness of breath. Gastrointestinal: No abdominal pain.  No nausea, no vomiting.  No diarrhea.  No constipation. Genitourinary: Negative for dysuria. Musculoskeletal: Negative for back pain. Skin: Negative for rash. Neurological: Negative for headaches, focal weakness   ____________________________________________   PHYSICAL  EXAM:  VITAL SIGNS: ED Triage Vitals [10/20/20 0945]  Enc Vitals Group     BP 109/80     Pulse Rate 88     Resp 18     Temp 97.9 F (36.6 C)     Temp Source Oral     SpO2 99 %     Weight 175 lb (79.4 kg)     Height 5' 10"  (1.778 m)     Head Circumference      Peak Flow      Pain Score 7     Pain Loc      Pain Edu?      Excl. in [place]?     Constitutional: Alert and oriented. Well appearing and in no acute distress. Eyes: Conjunctivae are normal. PER Head: Atraumatic. Nose: No congestion/rhinnorhea. Mouth/Throat: Mucous membranes are moist.  Oropharynx non-erythematous. Neck: No stridor.   Cardiovascular: Normal rate, regular rhythm. Grossly normal heart sounds.  Good peripheral circulation. Respiratory: Normal respiratory effort.  No retractions. Lungs CTAB. Gastrointestinal: Soft and nontender. No distention. No abdominal bruits.  No CVA tenderness. Musculoskeletal: No lower extremity tenderness mild edema in the left leg edema.  Patient reports edema in left leg has been coming and going for some time.  Patient is unable to elevate his right shoulder so that the humerus is horizontal.  Additionally he is unable to hold it there if I elevated.  He reports some tenderness under the edge of the shoulder in the deltoid area. Neurologic:  Normal speech and language. No gross focal neurologic deficits are appreciated.  Skin:  Skin is warm, dry and intact. No rash noted.  ____________________________________________   LABS (all labs ordered are listed, but only abnormal results are displayed)  Labs Reviewed  COMPREHENSIVE METABOLIC PANEL - Abnormal; Notable for the following components:      Result Value   Sodium 134 (*)    Glucose, Bld 313 (*)    Calcium 8.0 (*)    Total Protein 5.2 (*)    Albumin 2.9 (*)    Alkaline Phosphatase 314 (*)    All other components within normal limits  CBC WITH DIFFERENTIAL/PLATELET - Abnormal; Notable for the following components:   RBC 3.05  (*)    Hemoglobin 8.5 (*)    HCT 25.5 (*)    RDW 16.0 (*)    Platelets 138 (*)    Lymphs Abs 0.6 (*)    Abs Immature Granulocytes 0.12 (*)    All other components within normal limits  FIBRIN DERIVATIVES D-DIMER ([facility] ONLY) - Abnormal; Notable for the following components:   Fibrin derivatives D-dimer ([facility]) 1,121.33 (*)    All other components within normal limits  TROPONIN I (HIGH SENSITIVITY) - Abnormal; Notable for the following components:   Troponin I (High Sensitivity) 26 (*)    All other components within normal limits  TROPONIN I (HIGH SENSITIVITY) - Abnormal; Notable for the following components:   Troponin I (High Sensitivity) 27 (*)    All other components within normal limits  SARS CORONAVIRUS 2 (TAT 6-24 HRS)   ____________________________________________  EKG  EKG read interpreted by me shows normal sinus rhythm rate of 77 rightward axis no acute changes ____________________________________________  RADIOLOGY Gertha Calkin, personally viewed and evaluated these images (plain radiographs) as part of my medical decision making, as well as reviewing the written report by the radiologist.  ED MD interpretation: Chest x-ray read by radiology reviewed by me does not show any acute pathology.  Shoulders were also read by radiology reviewed by me and show no acute fracture. CT of the head and neck show no fractures or acute findings.  CT of the lungs does not show PE but there is a new spiculated mass in the upper lobe of the lung on the right side.  This is concerning for malignancy.   Official radiology report(s): DG Chest 2 View  Result Date: 10/20/2020 CLINICAL DATA:  65 year old male status post fall with pain. Cough cough x1 week. EXAM: CHEST - 2 VIEW COMPARISON:  Chest radiographs 08/16/2020 and earlier. FINDINGS: Stable right chest dual lumen power port. Lung volumes and mediastinal contours remain normal. Both lungs appear clear. No pneumothorax or pleural  effusion. No acute osseous abnormality identified. Prior cervical ACDF. Stable visible upper abdomen including small surgical clips. IMPRESSION: No acute cardiopulmonary abnormality or acute traumatic injury identified. Electronically Signed   By: Genevie Ann M.D.   On: 10/20/2020 11:10   DG Shoulder Right  Result Date: 10/20/2020 CLINICAL DATA:  65 year old male status post fall with pain. EXAM: RIGHT  SHOULDER - 2+ VIEW COMPARISON:  Chest radiograph 11/07/2012. contralateral left shoulder series today. FINDINGS: Bone mineralization is within normal limits for age. No glenohumeral joint dislocation. Mild degenerative changes at the right [facility] joint. No acute osseous abnormality identified. Stable visible right ribs and chest, dual lumen power port. IMPRESSION: No acute fracture or dislocation identified about the right shoulder. Electronically Signed   By: Genevie Ann M.D.   On: 10/20/2020 11:08   CT Head Wo Contrast  Result Date: 10/20/2020 CLINICAL DATA:  Cough for 1 week. [place] today due to weakness. Head trauma. EXAM: CT HEAD WITHOUT CONTRAST CT CERVICAL SPINE WITHOUT CONTRAST TECHNIQUE: Multidetector CT imaging of the head and cervical spine was performed following the standard protocol without intravenous contrast. Multiplanar CT image reconstructions of the cervical spine were also generated. COMPARISON:  07/27/2020 FINDINGS: CT HEAD FINDINGS Brain: No evidence of acute infarction, hemorrhage, hydrocephalus, extra-axial collection or mass lesion/mass effect. Vascular: No hyperdense vessel or unexpected calcification. Skull: Normal. Negative for fracture or focal lesion. Sinuses/Orbits: Mucosal thickening in the paranasal sinuses. No acute air-fluid levels. Mastoid air cells are clear. Other: None. CT CERVICAL SPINE FINDINGS Alignment: Straightening of usual cervical lordosis with slight anterior subluxation at C3-4. Alignment is unchanged since prior study and is likely combination of postoperative and  degenerative change. Normal alignment of the facet joints. C1-2 articulation appears intact. Skull base and vertebrae: Anterior plate and screw fixation with intervertebral fusion from C5 through C7. No vertebral compression deformities. No focal bone lesion or bone destruction. Soft tissues and spinal canal: No prevertebral soft tissue swelling. No abnormal paraspinal soft tissue mass or infiltration. Cervical lymph nodes are not pathologically enlarged. Disc levels: Intervertebral disc prosthesis at C5-6 and C6-7. Prominent degenerative changes at C4-5 and C7-T1. Degenerative changes throughout the facet joints. Upper chest: There is a spiculated mass versus focal consolidation in the right lung apex measuring 3.1 x 2.7 cm diameter. This was not present on a previous CT chest from 07/27/2020. The abnormality is incompletely included on this study. Other: None. IMPRESSION: 1. No acute intracranial abnormalities. 2. Postoperative and degenerative changes in the cervical spine. Anterior plate and screw fixation with intervertebral fusion from C5 through C7. No acute displaced fractures identified. 3. Spiculated mass versus focal consolidation in the right lung apex measuring 3.1 x 2.7 cm diameter. This was not present on a previous CT chest from 07/27/2020. This is incompletely included on this study. Chest CT suggested for further evaluation. Electronically Signed   By: Lucienne Capers M.D.   On: 10/20/2020 19:16   CT Angio Chest PE W and/or Wo Contrast  Result Date: 10/20/2020 CLINICAL DATA:  Positive D-dimer, cough for 1 week, fell, weakness, lymphoma EXAM: CT ANGIOGRAPHY CHEST WITH CONTRAST TECHNIQUE: Multidetector CT imaging of the chest was performed using the standard protocol during bolus administration of intravenous contrast. Multiplanar CT image reconstructions and MIPs were obtained to evaluate the vascular anatomy. CONTRAST:  6m OMNIPAQUE IOHEXOL 350 MG/ML SOLN COMPARISON:  10/20/2020, 08/16/2020  FINDINGS: Cardiovascular: This is a technically adequate evaluation of the pulmonary vasculature. No filling defects or pulmonary emboli. The heart is unremarkable without pericardial effusion. No evidence of thoracic aortic aneurysm or dissection. Mediastinum/Nodes: Soft tissue thickening along the left lateral margin of the trachea at the level of thoracic inlet, measuring 9 mm on image 29 of series 4, new since prior study. The remainder of the trachea, esophagus, and thyroid are unremarkable. No pathologic adenopathy. Lungs/Pleura: Since the prior exam, a spiculated right  upper lobe mass has developed measuring 3.5 by 3.2 by 4.0 cm. No other areas of consolidation. Chronic scarring at the lung bases. No effusion or pneumothorax. Upper Abdomen: Mass within the dome of the right lobe liver measuring 2.7 x 1.5 cm image 75, significantly decreased in size since previous study. A second hypodensity within the inferior right lobe liver abutting the gallbladder fossa on image 98 is incompletely evaluated on this study. Musculoskeletal: No acute or destructive bony lesions. Reconstructed images demonstrate no additional findings. Review of the MIP images confirms the above findings. IMPRESSION: 1. No evidence of pulmonary embolus. 2. New 4 cm right upper lobe spiculated mass concerning for malignancy. 3. Decreased size of a metastatic lesion within the dome of the right lobe liver as above. A second liver lesion within the more inferior right lobe liver is incompletely imaged on this study. 4. Nonspecific thickening of the left lateral wall the trachea at the level of thoracic inlet, new since prior study. Metastatic disease cannot be excluded. Electronically Signed   By: Randa Ngo M.D.   On: 10/20/2020 19:16   CT Cervical Spine Wo Contrast  Result Date: 10/20/2020 CLINICAL DATA:  Cough for 1 week. [place] today due to weakness. Head trauma. EXAM: CT HEAD WITHOUT CONTRAST CT CERVICAL SPINE WITHOUT CONTRAST  TECHNIQUE: Multidetector CT imaging of the head and cervical spine was performed following the standard protocol without intravenous contrast. Multiplanar CT image reconstructions of the cervical spine were also generated. COMPARISON:  07/27/2020 FINDINGS: CT HEAD FINDINGS Brain: No evidence of acute infarction, hemorrhage, hydrocephalus, extra-axial collection or mass lesion/mass effect. Vascular: No hyperdense vessel or unexpected calcification. Skull: Normal. Negative for fracture or focal lesion. Sinuses/Orbits: Mucosal thickening in the paranasal sinuses. No acute air-fluid levels. Mastoid air cells are clear. Other: None. CT CERVICAL SPINE FINDINGS Alignment: Straightening of usual cervical lordosis with slight anterior subluxation at C3-4. Alignment is unchanged since prior study and is likely combination of postoperative and degenerative change. Normal alignment of the facet joints. C1-2 articulation appears intact. Skull base and vertebrae: Anterior plate and screw fixation with intervertebral fusion from C5 through C7. No vertebral compression deformities. No focal bone lesion or bone destruction. Soft tissues and spinal canal: No prevertebral soft tissue swelling. No abnormal paraspinal soft tissue mass or infiltration. Cervical lymph nodes are not pathologically enlarged. Disc levels: Intervertebral disc prosthesis at C5-6 and C6-7. Prominent degenerative changes at C4-5 and C7-T1. Degenerative changes throughout the facet joints. Upper chest: There is a spiculated mass versus focal consolidation in the right lung apex measuring 3.1 x 2.7 cm diameter. This was not present on a previous CT chest from 07/27/2020. The abnormality is incompletely included on this study. Other: None. IMPRESSION: 1. No acute intracranial abnormalities. 2. Postoperative and degenerative changes in the cervical spine. Anterior plate and screw fixation with intervertebral fusion from C5 through C7. No acute displaced fractures  identified. 3. Spiculated mass versus focal consolidation in the right lung apex measuring 3.1 x 2.7 cm diameter. This was not present on a previous CT chest from 07/27/2020. This is incompletely included on this study. Chest CT suggested for further evaluation. Electronically Signed   By: Lucienne Capers M.D.   On: 10/20/2020 19:16   US Venous Img Lower Bilateral  Result Date: 10/20/2020 CLINICAL DATA:  Chronic leg swelling EXAM: BILATERAL LOWER EXTREMITY VENOUS DOPPLER ULTRASOUND TECHNIQUE: Gray-scale sonography with compression, as well as color and duplex ultrasound, were performed to evaluate the deep venous system(s) from the level  of the common femoral vein through the popliteal and proximal calf veins. COMPARISON:  None. FINDINGS: VENOUS Normal compressibility of the common femoral, superficial femoral, and popliteal veins, as well as the visualized calf veins. Visualized portions of profunda femoral vein and great saphenous vein unremarkable. No filling defects to suggest DVT on grayscale or color Doppler imaging. Doppler waveforms show normal direction of venous flow, normal respiratory plasticity and response to augmentation. OTHER None. Limitations: none IMPRESSION: Negative. Electronically Signed   By: Rolm Baptise M.D.   On: 10/20/2020 18:03   DG Shoulder Left  Result Date: 10/20/2020 CLINICAL DATA:  65 year old male status post fall with pain. EXAM: LEFT SHOULDER - 2+ VIEW COMPARISON:  Chest radiographs 07/26/2020 and earlier. FINDINGS: Bone mineralization is within normal limits for age. No glenohumeral joint dislocation. No acute osseous abnormality identified. Mild for age degenerative changes about the left shoulder. Negative visible left ribs and chest. Partially visible chronic cervical ACDF. IMPRESSION: No acute fracture or dislocation identified about the left shoulder. Electronically Signed   By: Genevie Ann M.D.   On: 10/20/2020 11:07     ____________________________________________   PROCEDURES  Procedure(s) performed (including Critical Care):  Procedures   ____________________________________________   INITIAL IMPRESSION / ASSESSMENT AND PLAN / ED COURSE  Patient reports he had rotator cuff surgery on the left arm over 10 years ago at [facility] and he was going to get the right arm done but a number of things came up and it was never done.  His exam makes me think that he may have injured to the rotator cuff in the right shoulder now. He has a history of cancer and the intermittent edema in the left leg make me wonder if the cough could could be due to a PE.  Additionally I will screen him for Covid.  Chest x-ray is clear.    ----------------------------------------- 7:34 PM on 10/20/2020 -----------------------------------------  Patient had a positive D-dimer but the CT of the chest did not show any PE.  He does have a spiculated mass in the right upper lobe which is new.  I will have him follow-up with his cancer doctors for that.  He did begin complaining of pain in his neck from the fall after had seen him initially CT of the head and neck were negative.  I will give the patient some Zithromax for the sputum he is producing.  He can use Tylenol and Motrin if needed for pain.  Otherwise I will have him follow-up with the cancer doctor as I noted.  He is somewhat more anemic now than before.  I will let the cancer doctor work on that as well.  He is not having any black      tarry stool.  In the stool specimen he came in today is Hemoccult negative.         ____________________________________________   FINAL CLINICAL IMPRESSION(S) / ED DIAGNOSES  Final diagnoses:  Cough  Fall, initial encounter  Mass of upper lobe of right lung  Anemia, unspecified type     ED Discharge Orders         Ordered    azithromycin (ZITHROMAX Z-PAK) 250 MG tablet        10/20/20 1937          *Please note:  Tristyn Demarest was evaluated in Emergency Department on 10/20/2020 for the symptoms described in the history of present illness. He was evaluated in the context of the global COVID-19 pandemic, which necessitated  consideration that the patient might be at risk for infection with the SARS-CoV-2 virus that causes COVID-19. Institutional protocols and algorithms that pertain to the evaluation of patients at risk for COVID-19 are in a state of rapid change based on information released by regulatory bodies including the CDC and federal and state organizations. These policies and algorithms were followed during the patient's care in the ED.  Some ED evaluations and interventions may be delayed as a result of limited staffing during and the pandemic.*   Note:  This document was prepared using Dragon voice recognition software and may include unintentional dictation errors.    Nena Polio, MD 10/20/20 2012

## 2020-10-20 NOTE — ED Notes (Signed)
Pt requesting to have port access for additional blood work . RN Coralyn Mark will access port

## 2020-10-20 NOTE — ED Notes (Signed)
Port accessed using 20ga needle. Patient tolerated well. Good flush and good blood return.

## 2020-10-21 NOTE — ED Notes (Signed)
Pt came to facility explaining that he was discharge with his PAC still accessed.  Designer, multimedia informed to pull access from triage room after flushing. Update Vitals for the patient.

## 2020-10-21 NOTE — ED Notes (Addendum)
Arrived to ED with port accessed after discharge; requesting it to be removed. Removed from right chest without difficulty. No bleeding. Sterile gauze placed over site.

## 2020-10-27 ENCOUNTER — Other Ambulatory Visit: Admit: 2020-10-27 | Discharge: 2020-10-27 | Payer: MEDICARE

## 2020-10-27 ENCOUNTER — Encounter: Admit: 2020-10-27 | Discharge: 2020-10-27 | Payer: MEDICARE

## 2020-10-27 DIAGNOSIS — Z931 Gastrostomy status: Principal | ICD-10-CM

## 2020-10-27 DIAGNOSIS — C8338 Diffuse large B-cell lymphoma, lymph nodes of multiple sites: Principal | ICD-10-CM

## 2020-10-27 DIAGNOSIS — E1140 Type 2 diabetes mellitus with diabetic neuropathy, unspecified: Principal | ICD-10-CM

## 2020-10-27 DIAGNOSIS — D7389 Other diseases of spleen: Principal | ICD-10-CM

## 2020-10-27 DIAGNOSIS — C8517 Unspecified B-cell lymphoma, spleen: Principal | ICD-10-CM

## 2020-10-27 DIAGNOSIS — Z794 Long term (current) use of insulin: Principal | ICD-10-CM

## 2020-10-27 DIAGNOSIS — M25561 Pain in right knee: Principal | ICD-10-CM

## 2020-10-27 DIAGNOSIS — K529 Noninfective gastroenteritis and colitis, unspecified: Principal | ICD-10-CM

## 2020-10-27 DIAGNOSIS — D72810 Lymphocytopenia: Principal | ICD-10-CM

## 2020-10-27 DIAGNOSIS — Z79899 Other long term (current) drug therapy: Principal | ICD-10-CM

## 2020-10-27 DIAGNOSIS — Z23 Encounter for immunization: Principal | ICD-10-CM

## 2020-10-27 DIAGNOSIS — F4310 Post-traumatic stress disorder, unspecified: Principal | ICD-10-CM

## 2020-10-27 DIAGNOSIS — J189 Pneumonia, unspecified organism: Principal | ICD-10-CM

## 2020-10-27 DIAGNOSIS — U071 COVID-19: Principal | ICD-10-CM

## 2020-10-27 DIAGNOSIS — K5190 Ulcerative colitis, unspecified, without complications: Principal | ICD-10-CM

## 2020-10-27 DIAGNOSIS — R202 Paresthesia of skin: Principal | ICD-10-CM

## 2020-10-27 DIAGNOSIS — C8599 Non-Hodgkin lymphoma, unspecified, extranodal and solid organ sites: Principal | ICD-10-CM

## 2020-10-27 DIAGNOSIS — Z9049 Acquired absence of other specified parts of digestive tract: Principal | ICD-10-CM

## 2020-10-27 DIAGNOSIS — D649 Anemia, unspecified: Principal | ICD-10-CM

## 2020-10-27 DIAGNOSIS — K769 Liver disease, unspecified: Principal | ICD-10-CM

## 2020-10-27 DIAGNOSIS — Z9221 Personal history of antineoplastic chemotherapy: Principal | ICD-10-CM

## 2020-10-27 DIAGNOSIS — I10 Essential (primary) hypertension: Principal | ICD-10-CM

## 2020-10-27 MED ORDER — BENZONATATE 100 MG CAPSULE
ORAL_CAPSULE | Freq: Four times a day (QID) | ORAL | 0 refills | 8.00000 days | Status: CP | PRN
Start: 2020-10-27 — End: 2021-10-27

## 2020-10-27 MED ORDER — AMOXICILLIN 875 MG-POTASSIUM CLAVULANATE 125 MG TABLET
ORAL_TABLET | Freq: Two times a day (BID) | ORAL | 0 refills | 14 days | Status: CP
Start: 2020-10-27 — End: 2020-11-10

## 2020-10-28 DIAGNOSIS — C8599 Non-Hodgkin lymphoma, unspecified, extranodal and solid organ sites: Principal | ICD-10-CM

## 2020-10-30 DIAGNOSIS — E1165 Type 2 diabetes mellitus with hyperglycemia: Principal | ICD-10-CM

## 2020-10-31 DIAGNOSIS — C8599 Non-Hodgkin lymphoma, unspecified, extranodal and solid organ sites: Principal | ICD-10-CM

## 2020-11-11 ENCOUNTER — Encounter: Admit: 2020-11-11 | Discharge: 2020-11-11 | Payer: MEDICARE

## 2020-11-11 DIAGNOSIS — C8338 Diffuse large B-cell lymphoma, lymph nodes of multiple sites: Principal | ICD-10-CM

## 2020-11-11 DIAGNOSIS — C8599 Non-Hodgkin lymphoma, unspecified, extranodal and solid organ sites: Principal | ICD-10-CM

## 2020-11-14 ENCOUNTER — Encounter: Admit: 2020-11-14 | Discharge: 2020-11-15 | Payer: MEDICARE

## 2020-11-14 DIAGNOSIS — C8330 Diffuse large B-cell lymphoma, unspecified site: Principal | ICD-10-CM

## 2020-11-15 DIAGNOSIS — F4310 Post-traumatic stress disorder, unspecified: Principal | ICD-10-CM

## 2020-11-15 DIAGNOSIS — C8338 Diffuse large B-cell lymphoma, lymph nodes of multiple sites: Principal | ICD-10-CM

## 2020-11-16 DIAGNOSIS — I959 Hypotension, unspecified: Principal | ICD-10-CM

## 2020-11-16 DIAGNOSIS — C8338 Diffuse large B-cell lymphoma, lymph nodes of multiple sites: Principal | ICD-10-CM

## 2020-11-16 DIAGNOSIS — F4310 Post-traumatic stress disorder, unspecified: Principal | ICD-10-CM

## 2020-11-16 MED ORDER — DIPHENOXYLATE-ATROPINE 2.5 MG-0.025 MG TABLET
ORAL_TABLET | 0 refills | 0 days | Status: CP
Start: 2020-11-16 — End: ?

## 2020-11-16 MED ORDER — MIDODRINE 5 MG TABLET
ORAL_TABLET | Freq: Three times a day (TID) | ORAL | 0 refills | 0.00000 days | Status: CP
Start: 2020-11-16 — End: 2020-11-16

## 2020-11-16 MED ORDER — DIAZEPAM 2 MG TABLET
ORAL_TABLET | 0 refills | 0 days | Status: CP
Start: 2020-11-16 — End: ?

## 2020-11-19 DIAGNOSIS — J189 Pneumonia, unspecified organism: Principal | ICD-10-CM

## 2020-11-19 DIAGNOSIS — C8338 Diffuse large B-cell lymphoma, lymph nodes of multiple sites: Principal | ICD-10-CM

## 2020-11-24 ENCOUNTER — Ambulatory Visit: Admit: 2020-11-24 | Payer: MEDICARE

## 2020-12-01 ENCOUNTER — Other Ambulatory Visit: Payer: Self-pay

## 2020-12-01 ENCOUNTER — Emergency Department
Admission: EM | Admit: 2020-12-01 | Discharge: 2020-12-01 | Disposition: A | Discharge status: Home / Self Care | Payer: Medicare Other | Attending: Student in an Organized Health Care Education/Training Program | Admitting: Student in an Organized Health Care Education/Training Program

## 2020-12-01 ENCOUNTER — Encounter: Payer: Self-pay | Admitting: Emergency Medicine

## 2020-12-01 DIAGNOSIS — Z9221 Personal history of antineoplastic chemotherapy: Secondary | ICD-10-CM | POA: Insufficient documentation

## 2020-12-01 DIAGNOSIS — G4701 Insomnia due to medical condition: Secondary | ICD-10-CM

## 2020-12-01 DIAGNOSIS — F419 Anxiety disorder, unspecified: Secondary | ICD-10-CM

## 2020-12-01 DIAGNOSIS — F4322 Adjustment disorder with anxiety: Secondary | ICD-10-CM | POA: Insufficient documentation

## 2020-12-01 DIAGNOSIS — Z7984 Long term (current) use of oral hypoglycemic drugs: Secondary | ICD-10-CM | POA: Diagnosis not present

## 2020-12-01 DIAGNOSIS — Z79899 Other long term (current) drug therapy: Secondary | ICD-10-CM | POA: Diagnosis not present

## 2020-12-01 DIAGNOSIS — Z859 Personal history of malignant neoplasm, unspecified: Secondary | ICD-10-CM | POA: Diagnosis not present

## 2020-12-01 DIAGNOSIS — E119 Type 2 diabetes mellitus without complications: Secondary | ICD-10-CM | POA: Insufficient documentation

## 2020-12-01 DIAGNOSIS — I10 Essential (primary) hypertension: Secondary | ICD-10-CM | POA: Diagnosis not present

## 2020-12-01 DIAGNOSIS — Z794 Long term (current) use of insulin: Secondary | ICD-10-CM | POA: Insufficient documentation

## 2020-12-01 DIAGNOSIS — G4700 Insomnia, unspecified: Secondary | ICD-10-CM

## 2020-12-01 HISTORY — DX: Malignant (primary) neoplasm, unspecified: C80.1

## 2020-12-01 LAB — COMPREHENSIVE METABOLIC PANEL
ALT: 58 U/L — ABNORMAL HIGH (ref 0–44)
AST: 41 U/L (ref 15–41)
Albumin: 3.4 g/dL — ABNORMAL LOW (ref 3.5–5.0)
Alkaline Phosphatase: 229 U/L — ABNORMAL HIGH (ref 38–126)
Anion gap: 11 (ref 5–15)
BUN: 30 mg/dL — ABNORMAL HIGH (ref 8–23)
CO2: 20 mmol/L — ABNORMAL LOW (ref 22–32)
Calcium: 8.9 mg/dL (ref 8.9–10.3)
Chloride: 98 mmol/L (ref 98–111)
Creatinine, Ser: 0.99 mg/dL (ref 0.61–1.24)
GFR, Estimated: >60 mL/min (ref >60)
Glucose, Bld: 359 mg/dL — ABNORMAL HIGH (ref 70–99)
Potassium: 4.1 mmol/L (ref 3.5–5.1)
Sodium: 129 mmol/L — ABNORMAL LOW (ref 135–145)
Total Bilirubin: 1.3 mg/dL — ABNORMAL HIGH (ref 0.3–1.2)
Total Protein: 5.9 g/dL — ABNORMAL LOW (ref 6.5–8.1)

## 2020-12-01 LAB — CBC WITH DIFFERENTIAL/PLATELET
Abs Immature Granulocytes: 0.35 10*3/uL — ABNORMAL HIGH (ref 0.00–0.07)
Basophils Absolute: 0 10*3/uL (ref 0.0–0.1)
Basophils Relative: 0 %
Eosinophils Absolute: 0 10*3/uL (ref 0.0–0.5)
Eosinophils Relative: 0 %
HCT: 33.5 % — ABNORMAL LOW (ref 39.0–52.0)
Hemoglobin: 11.7 g/dL — ABNORMAL LOW (ref 13.0–17.0)
Immature Granulocytes: 2 %
Lymphocytes Relative: 3 %
Lymphs Abs: 0.5 10*3/uL — ABNORMAL LOW (ref 0.7–4.0)
MCH: 29.9 pg (ref 26.0–34.0)
MCHC: 34.9 g/dL (ref 30.0–36.0)
MCV: 85.7 fL (ref 80.0–100.0)
Monocytes Absolute: 0.3 10*3/uL (ref 0.1–1.0)
Monocytes Relative: 2 %
Neutro Abs: 15.8 10*3/uL — ABNORMAL HIGH (ref 1.7–7.7)
Neutrophils Relative %: 93 %
Platelets: 158 10*3/uL (ref 150–400)
RBC: 3.91 MIL/uL — ABNORMAL LOW (ref 4.22–5.81)
RDW: 16.9 % — ABNORMAL HIGH (ref 11.5–15.5)
WBC: 17 10*3/uL — ABNORMAL HIGH (ref 4.0–10.5)
nRBC: 0 % (ref 0.0–0.2)

## 2020-12-01 LAB — URINALYSIS, COMPLETE (UACMP) WITH MICROSCOPIC
Bacteria, UA: NONE SEEN
Bilirubin Urine: NEGATIVE
Glucose, UA: >=500 mg/dL — AB
Ketones, ur: NEGATIVE mg/dL
Leukocytes,Ua: NEGATIVE
Nitrite: NEGATIVE
Protein, ur: NEGATIVE mg/dL
Specific Gravity, Urine: 1.003 — ABNORMAL LOW (ref 1.005–1.030)
pH: 6 (ref 5.0–8.0)

## 2020-12-01 LAB — LIPASE, BLOOD: Lipase: 24 U/L (ref 11–51)

## 2020-12-01 LAB — AMMONIA: Ammonia: 25 umol/L (ref 9–35)

## 2020-12-01 MED ORDER — LORAZEPAM 1 MG PO TABS
1.0000 mg | ORAL_TABLET | Freq: Once | ORAL | Status: AC
  Administered 2020-12-01: 1 mg via ORAL
  Filled 2020-12-01: qty 1

## 2020-12-01 MED ORDER — SODIUM CHLORIDE 0.9 % IV BOLUS: 500.0000 mL | Freq: Once | INTRAVENOUS | Status: DC

## 2020-12-01 MED ORDER — TEMAZEPAM 15 MG PO CAPS: 15.0000 mg | ORAL_CAPSULE | Freq: Every evening | ORAL | 0 refills | Status: DC | PRN

## 2020-12-01 MED ORDER — INSULIN ASPART 100 UNIT/ML ~~LOC~~ SOLN
6.0000 [IU] | Freq: Once | SUBCUTANEOUS | Status: AC
  Administered 2020-12-01: 6 [IU] via SUBCUTANEOUS
  Filled 2020-12-01: qty 1

## 2020-12-01 NOTE — ED Notes (Signed)
ED Provider at bedside. 

## 2020-12-01 NOTE — Discharge Instructions (Addendum)
Please follow-up with your primary care physician or oncologist regarding your continued anxiety to decide if you require further therapy or medications.  Please begin taking your prescribed medication as written.  Please start taking your Effexor in the morning instead of the evening which should help with sleep.  Return to the emergency department for any symptom personally concerning to yourself.

## 2020-12-01 NOTE — ED Notes (Signed)
Pt got OOB to urinate. Pt attempted to urinate in urinal but soaked pants with urine. Pt back in bed now. Pt follows commands but is slightly confused as he forgets to call nurse to get out of bed.

## 2020-12-01 NOTE — ED Triage Notes (Signed)
Pt to ED from home c/o trouble sleeping and feeling scared for the last 2 months.  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  Pt states feels stressed, terror and like everything is going to end.  Pt states has had more episodes of confusion and forgetfulness.  Pt A&Ox4, chest rise even and unlabored, denies pain at this time.  Pt received chemo and last had treatment 1 month ago.

## 2020-12-01 NOTE — ED Notes (Addendum)
Pt presents via triage for c/o increased anxiety, terror, hopelessness, and insomnia. Denies SI.  Pt currently having chemo for cancer of the liver, pancreas, duodenum, and lung. Pt denies recent illness. Sts he has "felt hot" at times, denies measuring temp at home. Pt reports chronic pain in abdomen and joints.  Pt sts he is more anxious when alone and when trying to sleep. At this time, patient is A&Ox4, however has some difficulty finding words at times.  Pt sts prescribed medications do not help with insomnia.

## 2020-12-01 NOTE — ED Notes (Signed)
Pt assisted to call wife.

## 2020-12-01 NOTE — ED Notes (Signed)
Pt observed attempting to get out of bed alone after dropping the tv remote on the floor. RN reminded patient to use call bell for assistance when getting out of bed. Call bell remains in reach.

## 2020-12-01 NOTE — ED Notes (Signed)
Spoke with wife she stated she was on her way  To the hospital

## 2020-12-01 NOTE — ED Notes (Signed)
Discharge intructions reviewed with pt and wife. Pt calm,collectie , denied pain

## 2020-12-01 NOTE — ED Notes (Signed)
Pt got up to urinate in toilet in room. Entered room, reminded pt to call nurse when needs to get up. Pt now back in bed. Encouraged pt to rest. Provided urinal.

## 2020-12-01 NOTE — Consult Note (Signed)
[name]    Reason for [name]: [name] for 65 year old man with cancer complaining of anxiety and difficulty sleeping Referring Physician: Paduchowski Patient Identification: [name] MRN:  [id number] Principal Diagnosis: Insomnia Diagnosis:  Principal Problem:   Insomnia Active Problems:   Adjustment disorder with anxiety   Total Time spent with patient: 1 hour  Subjective:   [name] is a 65 y.o. male patient admitted with "this has been a hard week".  HPI: Patient seen chart reviewed.  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  Reports that he continues to have daily anxiety but his chief complaint to me is that he cannot sleep.  Cannot fall asleep well cannot stay asleep well.  He does feel nervous and says that his mood is up and down during the day.  At times he feels very sad but not consistently.  He thinks about death and seems to not have a clear idea of his medical prognosis but denies multiple times having any suicidal thoughts.  No homicidal thoughts.  No psychosis.  Patient is married lives with his wife and some extended family including a 79-1/2-year-old daughter.  Retired.  Seems to be able to discuss multiple positive things to live for.  Reviewed the patient's controlled substance database and it is notable to me how little pain medicine he is getting.  No narcotic prescriptions filled for the last couple months and even that 1 was pretty small.  Additionally he is only getting 2 mg of diazepam per day rather than the 10 that the chart has listed as his dosage.  Patient also takes venlafaxine XR 150 mg but takes it at nighttime.  Past Psychiatric History: No prior hospitalization.  No history of suicide attempts.  History of depression has been treated with antidepressants for at least a year.  No history of substance abuse reported at all.  Risk to Self:   Risk to Others:   Prior Inpatient Therapy:   Prior  Outpatient Therapy:    Past Medical History:  Past Medical History:  Diagnosis Date  . Cancer ([place])   . Diabetes mellitus without complication [facility])     Past Surgical History:  Procedure Laterality Date  . OTHER SURGICAL HISTORY     J pouch 1997, history of ileostomy with reversal   Family History: History reviewed. No pertinent family history. Family Psychiatric  History: Does not know of any Social History:  Social History   Substance and Sexual Activity  Alcohol Use Never     Social History   Substance and Sexual Activity  Drug Use Never    Social History   Socioeconomic History  . Marital status: Married    Spouse name: Not on file  . Number of children: Not on file  . Years of education: Not on file  . Highest education level: Not on file  Occupational History  . Not on file  Tobacco Use  . Smoking status: Never Smoker  . Smokeless tobacco: Never Used  Substance and Sexual Activity  . Alcohol use: Never  . Drug use: Never  . Sexual activity: Not on file  Other Topics Concern  . Not on file  Social History Narrative  . Not on file   Social Determinants of Health   Financial Resource Strain: Not on file  Food Insecurity: Not on file  Transportation Needs: Not on file  Physical Activity: Not on file  Stress: Not on file  Social Connections: Not on file  Additional Social History:    Allergies:   Allergies  Allergen Reactions  . Sulfa Antibiotics Rash and Hives  . Oxycodone Other (See Comments) and Rash    Rash and headache    Labs:  Results for orders placed or performed during the hospital encounter of 12/01/20 (from the past 48 hour(s))  CBC with Differential     Status: Abnormal   Collection Time: 12/01/20  2:18 AM  Result Value Ref Range   WBC 17.0 (H) 4.0 - 10.5 K/uL   RBC 3.91 (L) 4.22 - 5.81 MIL/uL   Hemoglobin 11.7 (L) 13.0 - 17.0 g/dL   HCT 33.5 (L) 39.0 - 52.0 %   MCV 85.7 80.0 - 100.0 fL   MCH 29.9 26.0 - 34.0 pg   MCHC 34.9  30.0 - 36.0 g/dL   RDW 16.9 (H) 11.5 - 15.5 %   Platelets 158 150 - 400 K/uL   nRBC 0.0 0.0 - 0.2 %   Neutrophils Relative % 93 %   Neutro Abs 15.8 (H) 1.7 - 7.7 K/uL   Lymphocytes Relative 3 %   Lymphs Abs 0.5 (L) 0.7 - 4.0 K/uL   Monocytes Relative 2 %   Monocytes Absolute 0.3 0.1 - 1.0 K/uL   Eosinophils Relative 0 %   Eosinophils Absolute 0.0 0.0 - 0.5 K/uL   Basophils Relative 0 %   Basophils Absolute 0.0 0.0 - 0.1 K/uL   Immature Granulocytes 2 %   Abs Immature Granulocytes 0.35 (H) 0.00 - 0.07 K/uL    Comment: Performed at [facility], [address] [place], [place] [address]  Comprehensive metabolic panel     Status: Abnormal   Collection Time: 12/01/20  2:18 AM  Result Value Ref Range   Sodium 129 (L) 135 - 145 mmol/L   Potassium 4.1 3.5 - 5.1 mmol/L   Chloride 98 98 - 111 mmol/L   CO2 20 (L) 22 - 32 mmol/L   Glucose, Bld 359 (H) 70 - 99 mg/dL    Comment: Glucose reference range applies only to samples taken after fasting for at least 8 hours.   BUN 30 (H) 8 - 23 mg/dL   Creatinine, Ser 0.99 0.61 - 1.24 mg/dL   Calcium 8.9 8.9 - 10.3 mg/dL   Total Protein 5.9 (L) 6.5 - 8.1 g/dL   Albumin 3.4 (L) 3.5 - 5.0 g/dL   AST 41 15 - 41 U/L   ALT 58 (H) 0 - 44 U/L   Alkaline Phosphatase 229 (H) 38 - 126 U/L   Total Bilirubin 1.3 (H) 0.3 - 1.2 mg/dL   GFR, Estimated >60 >60 mL/min    Comment: (NOTE) Calculated using the CKD-EPI Creatinine Equation (2021)    Anion gap 11 5 - 15    Comment: Performed at [facility], [address] [place], [place] [address]  Lipase, blood     Status: None   Collection Time: 12/01/20  2:18 AM  Result Value Ref Range   Lipase 24 11 - 51 U/L    Comment: Performed at [facility], [address] [place], [place] [address]  Urinalysis, Complete w Microscopic     Status: Abnormal   Collection Time: 12/01/20  2:18 AM  Result Value Ref Range   Color, Urine STRAW (A) YELLOW   APPearance CLEAR (A) CLEAR    Specific Gravity, Urine 1.003 (L) 1.005 - 1.030   pH 6.0 5.0 - 8.0   Glucose, UA >=500 (A) NEGATIVE mg/dL   Hgb  urine dipstick SMALL (A) NEGATIVE   Bilirubin Urine NEGATIVE NEGATIVE   Ketones, ur NEGATIVE NEGATIVE mg/dL   Protein, ur NEGATIVE NEGATIVE mg/dL   Nitrite NEGATIVE NEGATIVE   Leukocytes,Ua NEGATIVE NEGATIVE   WBC, UA 0-5 0 - 5 WBC/hpf   Bacteria, UA NONE SEEN NONE SEEN   Squamous Epithelial / LPF 0-5 0 - 5    Comment: Performed at [facility], [address] [place], [place] [address]  Ammonia     Status: None   Collection Time: 12/01/20  2:18 AM  Result Value Ref Range   Ammonia 25 9 - 35 umol/L    Comment: Performed at [facility], [address] [place], [place] [address]    No current facility-administered medications for this encounter.   Current Outpatient Medications  Medication Sig Dispense Refill  . temazepam (RESTORIL) 15 MG capsule Take 1 capsule (15 mg total) by mouth at bedtime as needed for sleep. 30 capsule 0  . atorvastatin (LIPITOR) 40 MG tablet Take 40 mg by mouth daily.  2  . CIALIS 10 MG tablet Take 10 mg by mouth as needed.    . diazepam (VALIUM) 5 MG tablet Take 5 mg by mouth 2 (two) times daily as needed for anxiety.    . diphenoxylate-atropine (LOMOTIL) 2.5-0.025 MG tablet Take 1 tablet by mouth 4 (four) times daily as needed for diarrhea or loose stools.    . DPH-Lido-AlHydr-MgHydr-Simeth (FIRST-MOUTHWASH BLM) SUSP Swish 10 mL by mouth then swallow. Do this 4 (four) times a day as needed.    . fluticasone (FLONASE) 50 MCG/ACT nasal spray 1 spray into each nostril daily as needed for rhinitis.    Marland Kitchen gabapentin (NEURONTIN) 600 MG tablet Take 0.5 tablets (300 mg total) by mouth at bedtime. Follow with your PCP regarding dose adjustment 15 tablet 0  . glipiZIDE (GLUCOTROL) 10 MG tablet Take 10 mg by mouth daily. (Patient not taking: Reported on 05/09/2020)    . HYDROcodone-acetaminophen (NORCO/VICODIN) 5-325 MG tablet Take 1-2  tablets by mouth every 6 (six) hours as needed for moderate pain or severe pain. (Patient not taking: Reported on 05/09/2020) 12 tablet 0  . LANTUS SOLOSTAR 100 UNIT/ML Solostar Pen Inject 35 Units into the skin at bedtime.    Marland Kitchen lisinopril (PRINIVIL,ZESTRIL) 5 MG tablet Take 5 mg by mouth daily.  0  . magnesium oxide (MAG-OX) 400 MG tablet Take by mouth.    . metFORMIN (GLUCOPHAGE) 1000 MG tablet Take 1,000 mg by mouth 2 (two) times daily.  2  . nystatin (MYCOSTATIN) 100000 UNIT/ML suspension Take by mouth.    Marland Kitchen omeprazole (PRILOSEC) 40 MG capsule Take 40 mg by mouth daily.   4  . ondansetron (ZOFRAN) 8 MG tablet Starting the day after chemotherapy on day 6, take one 8 mg tab every 8 hours x 3 days. Then, take one 8 mg tab every 8 hours as needed.    . pantoprazole (PROTONIX) 40 MG tablet Take 1 tablet (40 mg total) by mouth daily for 14 days. 14 tablet 0  . prochlorperazine (COMPAZINE) 10 MG tablet Take by mouth.    . venlafaxine XR (EFFEXOR-XR) 150 MG 24 hr capsule Take 150 mg by mouth daily.  1  . VICTOZA 18 MG/3ML SOPN Inject 0.6 mg into the skin once a week.      Musculoskeletal: Strength & Muscle Tone: within normal limits Gait & Station: normal Patient leans: N/A            Psychiatric  Specialty Exam:  Presentation  General Appearance: No data recorded Eye Contact:No data recorded Speech:No data recorded Speech Volume:No data recorded Handedness:No data recorded  Mood and Affect  Mood:No data recorded Affect:No data recorded  Thought Process  Thought Processes:No data recorded Descriptions of Associations:No data recorded Orientation:No data recorded Thought Content:No data recorded History of Schizophrenia/Schizoaffective disorder:No data recorded Duration of Psychotic Symptoms:No data recorded Hallucinations:No data recorded Ideas of Reference:No data recorded Suicidal Thoughts:No data recorded Homicidal Thoughts:No data recorded  Sensorium  Memory:No  data recorded Judgment:No data recorded Insight:No data recorded  Executive Functions  Concentration:No data recorded Attention Span:No data recorded Recall:No data recorded Fund of Knowledge:No data recorded Language:No data recorded  Psychomotor Activity  Psychomotor Activity:No data recorded  Assets  Assets:No data recorded  Sleep  Sleep:No data recorded  Physical Exam: Physical Exam Vitals and nursing note reviewed.  Constitutional:      Appearance: Normal appearance.  HENT:     Head: Normocephalic and atraumatic.     Mouth/Throat:     Pharynx: Oropharynx is clear.  Eyes:     Pupils: Pupils are equal, round, and reactive to light.  Cardiovascular:     Rate and Rhythm: Normal rate and regular rhythm.  Pulmonary:     Effort: Pulmonary effort is normal.     Breath sounds: Normal breath sounds.  Abdominal:     General: Abdomen is flat.     Palpations: Abdomen is soft.  Musculoskeletal:        General: Normal range of motion.  Skin:    General: Skin is warm and dry.  Neurological:     General: No focal deficit present.     Mental Status: He is alert. Mental status is at baseline.  Psychiatric:        Attention and Perception: Attention normal.        Mood and Affect: Mood is anxious.        Speech: Speech is delayed.        Behavior: Behavior is slowed.        Thought Content: Thought content normal. Thought content does not include suicidal ideation.        Cognition and Memory: Cognition normal.        Judgment: Judgment normal.    Review of Systems  Constitutional: Negative.   HENT: Negative.   Eyes: Negative.   Respiratory: Negative.   Cardiovascular: Negative.   Gastrointestinal: Negative.   Musculoskeletal: Negative.   Skin: Negative.   Neurological: Negative.   Psychiatric/Behavioral: Negative for hallucinations, substance abuse and suicidal ideas. The patient is nervous/anxious and has insomnia.    Blood pressure (!) 136/101, pulse 89,  temperature 98.4 F (36.9 C), temperature source Oral, resp. rate (!) 23, height 5' 10"  (1.778 m), weight 77.1 kg, SpO2 100 %. Body mass index is 24.39 kg/m.  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  Seems to be having significant anxiety as might be expected under the circumstances with difficulty sleeping.  Spent time talking with the patient and it is clear that he has a good sense of what he values about his remaining time being able to be comfortable and awake and spend time with his family.  There is no history of substance abuse in his current benzodiazepine doses are extremely small.  I suggested that we add temazepam 15 mg at night for sleep.  Patient agrees to plan.  Prescription has been printed out.  It can be provided  to him at discharge.  I strongly encouraged him to follow-up with his primary doctors and cancer doctors about comfort measures like this.  I also suggested to him that he change his Effexor dosing to morning as that typically T seems to be the preferred and better tolerated and more effective dosing schedule.  Case reviewed with ER doctor.  No need for commitment or inpatient treatment.  Disposition: No evidence of imminent risk to self or others at present.   Patient does not meet criteria for psychiatric inpatient admission. Supportive therapy provided about ongoing stressors. Discussed crisis plan, support from social network, calling 911, coming to the Emergency Department, and calling Suicide Hotline.  Alethia Berthold, MD 12/01/2020 12:03 PM

## 2020-12-01 NOTE — ED Notes (Signed)
Wife at bedside.

## 2020-12-01 NOTE — ED Notes (Signed)
Pt forgot that he had promofit on and got out of bed again to walk to toilet. Pt urinated in toilet. Pt back in bed. Promofit reapplied. Warm blankets given and fresh ice with sprite given.

## 2020-12-01 NOTE — ED Notes (Signed)
Reassured pt that he can urinate into promofit. Peri care performed. Dry chux applied.

## 2020-12-01 NOTE — ED Notes (Signed)
Assisted pt to the tiolet

## 2020-12-01 NOTE — ED Provider Notes (Signed)
[facility] Emergency Department Provider Note    Event Date/Time   First MD Initiated Contact with Patient 12/01/20 0200     (approximate)  I have reviewed the triage vital signs and the nursing notes.   HISTORY  Chief Complaint Insomnia and Altered Mental Status    HPI [name] is a 65 y.o. male with extensive oncological history presents to the ER for 2 months of increasing feeling of care anxiety and depression now having trouble sleeping.  Is undergoing chemotherapy denies any new pain weakness shortness of breath or chest discomfort.  Is not currently on any antidepressant medication.  States that he is try to talk to his oncologist about the symptoms but they have been "very busy.  ".  Is not spoken with palliative care yet.  Does not have a psychiatrist.  He does not have any thoughts of wanting to harm himself currently.    Past Medical History:  Diagnosis Date  . Cancer ([place])   . Diabetes mellitus without complication ([place])    History reviewed. No pertinent family history. Past Surgical History:  Procedure Laterality Date  . OTHER SURGICAL HISTORY     J pouch 1997, history of ileostomy with reversal   Patient Active Problem List   Diagnosis Date Noted  . Chronic venous insufficiency 05/10/2020  . Hyponatremia 12/05/2019  . Diarrhea 12/05/2019  . Shingles 12/05/2019  . Dehydration 12/04/2019  . Post traumatic stress disorder (PTSD) 10/07/2018  . Right knee pain 05/27/2018  . Hypoglycemia 05/27/2017  . TBI (traumatic brain injury) ([place]) 04/02/2017  . 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 Essential hypertension 09/04/2016  . Uncontrolled type 2 diabetes mellitus ([place]) 08/08/2016  . Onychomycosis 09/12/2015  . Panic attack 08/24/2015  . Pouchitis ([place]) 09/01/2014  . Periodic limb movement disorder (PLMD) 10/13/2013  . 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 UC (ulcerative colitis) ([place])  08/18/2013  . Obstructive sleep apnea 01/29/2012  . Posterior subcapsular polar senile cataract 12/31/2011  . 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 Authorizing Provider  atorvastatin (LIPITOR) 40 MG tablet Take 40 mg by mouth daily. 03/14/17   [provider]  CIALIS 10 MG tablet Take 10 mg by mouth as needed. 03/22/20   [provider]  diazepam (VALIUM) 5 MG tablet Take 5 mg by mouth 2 (two) times daily as needed for anxiety. 10/01/19   [provider]  diphenoxylate-atropine (LOMOTIL) 2.5-0.025 MG tablet Take 1 tablet by mouth 4 (four) times daily as needed for diarrhea or loose stools. 11/02/19   [provider]  DPH-Lido-AlHydr-MgHydr-Simeth (FIRST-MOUTHWASH BLM) SUSP Swish 10 mL by mouth then swallow. Do this 4 (four) times a day as needed. 06/09/20   [provider]  fluticasone (FLONASE) 50 MCG/ACT nasal spray 1 spray into each nostril daily as needed for rhinitis.    [provider]  gabapentin (NEURONTIN) 600 MG tablet Take 0.5 tablets (300 mg total) by mouth at bedtime. Follow with your PCP regarding dose adjustment 12/07/19 01/06/20  Elodia Florence., MD  glipiZIDE (GLUCOTROL) 10 MG tablet Take 10 mg by mouth daily. Patient not taking: Reported on 05/09/2020 10/07/18   [provider]  HYDROcodone-acetaminophen (NORCO/VICODIN) 5-325 MG tablet Take 1-2 tablets by mouth every 6 (six) hours as needed for moderate pain or severe pain. Patient not taking: Reported on 05/09/2020 04/05/20  04/05/21  Duffy Bruce, MD  LANTUS SOLOSTAR 100 UNIT/ML Solostar Pen Inject 35 Units into the skin at bedtime. 09/29/19   [provider]  lisinopril (PRINIVIL,ZESTRIL) 5 MG tablet Take 5 mg by mouth daily. 05/23/17   [provider]  magnesium oxide (MAG-OX) 400 MG tablet Take by mouth. 06/09/20   [provider]  metFORMIN  (GLUCOPHAGE) 1000 MG tablet Take 1,000 mg by mouth 2 (two) times daily. 04/08/17   [provider]  nystatin (MYCOSTATIN) 100000 UNIT/ML suspension Take by mouth. 06/09/20   [provider]  omeprazole (PRILOSEC) 40 MG capsule Take 40 mg by mouth daily.     [provider]  ondansetron (ZOFRAN) 8 MG tablet Starting the day after chemotherapy on day 6, take one 8 mg tab every 8 hours x 3 days. Then, take one 8 mg tab every 8 hours as needed. 06/09/20   [provider]  pantoprazole (PROTONIX) 40 MG tablet Take 1 tablet (40 mg total) by mouth daily for 14 days. 04/05/20 04/19/20  Duffy Bruce, MD  prochlorperazine (COMPAZINE) 10 MG tablet Take by mouth. 06/09/20 07/09/20  [provider]  venlafaxine XR (EFFEXOR-XR) 150 MG 24 hr capsule Take 150 mg by mouth daily. 03/20/17   [provider]  VICTOZA 18 MG/3ML SOPN Inject 0.6 mg into the skin once a week. 03/22/20   [provider]    Allergies Sulfa antibiotics and Oxycodone    Social History Social History   Tobacco Use  . Smoking status: Never Smoker  . Smokeless tobacco: Never Used  Substance Use Topics  . Alcohol use: Never  . Drug use: Never    Review of Systems Patient denies headaches, rhinorrhea, blurry vision, numbness, shortness of breath, chest pain, edema, cough, abdominal pain, nausea, vomiting, diarrhea, dysuria, fevers, rashes or hallucinations unless otherwise stated above in HPI. ____________________________________________   PHYSICAL EXAM:  VITAL SIGNS: Vitals:   12/01/20 0330 12/01/20 0519  BP: 139/82 111/72  Pulse: 100 89  Resp: 19 19  Temp:    SpO2: 98% 97%    Constitutional: Alert and oriented.  Eyes: Conjunctivae are normal.  Head: Atraumatic. Nose: No congestion/rhinnorhea. Mouth/Throat: Mucous membranes are moist.   Neck: No stridor. Painless ROM.  Cardiovascular: Normal rate, regular rhythm. Grossly normal heart sounds.  Good peripheral  circulation. Respiratory: Normal respiratory effort.  No retractions. Lungs CTAB. Gastrointestinal: Soft and nontender. No distention. No abdominal bruits. No CVA tenderness. Genitourinary:  Musculoskeletal: No lower extremity tenderness nor edema.  No joint effusions. Neurologic:  Normal speech and language. No gross focal neurologic deficits are appreciated. No facial droop Skin:  Skin is warm, dry and intact. No rash noted. Psychiatric: Mood and affect are anxious. Speech and behavior are normal.  ____________________________________________   LABS (all labs ordered are listed, but only abnormal results are displayed)  Results for orders placed or performed during the hospital encounter of 12/01/20 (from the past 24 hour(s))  CBC with Differential     Status: Abnormal   Collection Time: 12/01/20  2:18 AM  Result Value Ref Range   WBC 17.0 (H) 4.0 - 10.5 K/uL   RBC 3.91 (L) 4.22 - 5.81 MIL/uL   Hemoglobin 11.7 (L) 13.0 - 17.0 g/dL   HCT 33.5 (L) 39.0 - 52.0 %   MCV 85.7 80.0 - 100.0 fL   MCH 29.9 26.0 - 34.0 pg   MCHC 34.9 30.0 - 36.0 g/dL   RDW 16.9 (H) 11.5 - 15.5 %   Platelets  158 150 - 400 K/uL   nRBC 0.0 0.0 - 0.2 %   Neutrophils Relative % 93 %   Neutro Abs 15.8 (H) 1.7 - 7.7 K/uL   Lymphocytes Relative 3 %   Lymphs Abs 0.5 (L) 0.7 - 4.0 K/uL   Monocytes Relative 2 %   Monocytes Absolute 0.3 0.1 - 1.0 K/uL   Eosinophils Relative 0 %   Eosinophils Absolute 0.0 0.0 - 0.5 K/uL   Basophils Relative 0 %   Basophils Absolute 0.0 0.0 - 0.1 K/uL   Immature Granulocytes 2 %   Abs Immature Granulocytes 0.35 (H) 0.00 - 0.07 K/uL  Comprehensive metabolic panel     Status: Abnormal   Collection Time: 12/01/20  2:18 AM  Result Value Ref Range   Sodium 129 (L) 135 - 145 mmol/L   Potassium 4.1 3.5 - 5.1 mmol/L   Chloride 98 98 - 111 mmol/L   CO2 20 (L) 22 - 32 mmol/L   Glucose, Bld 359 (H) 70 - 99 mg/dL   BUN 30 (H) 8 - 23 mg/dL   Creatinine, Ser 0.99 0.61 - 1.24 mg/dL    Calcium 8.9 8.9 - 10.3 mg/dL   Total Protein 5.9 (L) 6.5 - 8.1 g/dL   Albumin 3.4 (L) 3.5 - 5.0 g/dL   AST 41 15 - 41 U/L   ALT 58 (H) 0 - 44 U/L   Alkaline Phosphatase 229 (H) 38 - 126 U/L   Total Bilirubin 1.3 (H) 0.3 - 1.2 mg/dL   GFR, Estimated >60 >60 mL/min   Anion gap 11 5 - 15  Lipase, blood     Status: None   Collection Time: 12/01/20  2:18 AM  Result Value Ref Range   Lipase 24 11 - 51 U/L  Urinalysis, Complete w Microscopic     Status: Abnormal   Collection Time: 12/01/20  2:18 AM  Result Value Ref Range   Color, Urine STRAW (A) YELLOW   APPearance CLEAR (A) CLEAR   Specific Gravity, Urine 1.003 (L) 1.005 - 1.030   pH 6.0 5.0 - 8.0   Glucose, UA >=500 (A) NEGATIVE mg/dL   Hgb urine dipstick SMALL (A) NEGATIVE   Bilirubin Urine NEGATIVE NEGATIVE   Ketones, ur NEGATIVE NEGATIVE mg/dL   Protein, ur NEGATIVE NEGATIVE mg/dL   Nitrite NEGATIVE NEGATIVE   Leukocytes,Ua NEGATIVE NEGATIVE   WBC, UA 0-5 0 - 5 WBC/hpf   Bacteria, UA NONE SEEN NONE SEEN   Squamous Epithelial / LPF 0-5 0 - 5  Ammonia     Status: None   Collection Time: 12/01/20  2:18 AM  Result Value Ref Range   Ammonia 25 9 - 35 umol/L   ____________________________________________ ____________________________________________  RADIOLOGY   ____________________________________________   PROCEDURES  Procedure(s) performed:  Procedures    Critical Care performed: no ____________________________________________   INITIAL IMPRESSION / ASSESSMENT AND PLAN / ED COURSE  Pertinent labs & imaging results that were available during my care of the patient were reviewed by me and considered in my medical decision making (see chart for details).   DDX: Anxiety, depression, insomnia, electrolyte abnormality, dehydration, hypoglycemia  [name] is a 65 y.o. who presents to the ED with presentation as described above.  Patient nontoxic.  Blood work is roughly at baseline mild hyperglycemia.  No  infectious symptoms.  Patient was given Ativan with some improvement in his symptoms.  He does not meet criteria for IVC but is requesting psychiatric evaluation which I think is reasonable given the  extent of his symptoms.  Will consult psychiatry.     The patient was evaluated in Emergency Department today for the symptoms described in the history of present illness. He/she was evaluated in the context of the global COVID-19 pandemic, which necessitated consideration that the patient might be at risk for infection with the SARS-CoV-2 virus that causes COVID-19. Institutional protocols and algorithms that pertain to the evaluation of patients at risk for COVID-19 are in a state of rapid change based on information released by regulatory bodies including the CDC and federal and state organizations. These policies and algorithms were followed during the patient's care in the ED.  As part of my medical decision making, I reviewed the following data within the [place] notes reviewed and incorporated, Labs reviewed, notes from prior ED visits and [place] Controlled Substance Database   ____________________________________________   FINAL CLINICAL IMPRESSION(S) / ED DIAGNOSES  Final diagnoses:  Anxiety      NEW MEDICATIONS STARTED DURING THIS VISIT:  New Prescriptions   No medications on file     Note:  This document was prepared using Dragon voice recognition software and may include unintentional dictation errors.    Merlyn Lot, MD 12/01/20 [phone]

## 2020-12-01 NOTE — ED Provider Notes (Signed)
-----------------------------------------   12:06 PM on 12/01/2020 -----------------------------------------  Patient care assumed from Dr. Quentin Cornwall. Psychiatry is seen and evaluated the patient.  They will be prescribing Restoril for the patient.  I believe the patient is safe for discharge home from psychiatric standpoint.  Patient appears safe for discharge home from medical standpoint as well.  Patient is to follow-up with his doctor, could possibly benefit from increased anxiolytic or pain medication given his advanced cancer.   Harvest Dark, MD 12/01/20 [phone]

## 2020-12-07 ENCOUNTER — Encounter: Admit: 2020-12-07 | Discharge: 2020-12-08 | Payer: MEDICARE

## 2020-12-07 ENCOUNTER — Ambulatory Visit: Admit: 2020-12-07 | Discharge: 2020-12-08 | Payer: MEDICARE

## 2020-12-08 ENCOUNTER — Ambulatory Visit: Admit: 2020-12-08 | Discharge: 2020-12-09 | Payer: MEDICARE

## 2020-12-08 ENCOUNTER — Encounter: Admit: 2020-12-08 | Discharge: 2020-12-09 | Payer: MEDICARE

## 2020-12-09 ENCOUNTER — Encounter: Admit: 2020-12-09 | Discharge: 2020-12-09 | Payer: MEDICARE

## 2020-12-09 DIAGNOSIS — C8338 Diffuse large B-cell lymphoma, lymph nodes of multiple sites: Principal | ICD-10-CM

## 2020-12-09 MED ORDER — OLANZAPINE 5 MG TABLET
ORAL_TABLET | Freq: Every evening | ORAL | 2 refills | 30.00000 days | Status: CP
Start: 2020-12-09 — End: 2021-12-09

## 2020-12-09 MED ORDER — ALPRAZOLAM 0.25 MG TABLET
ORAL_TABLET | Freq: Three times a day (TID) | ORAL | 0 refills | 10 days | Status: CP | PRN
Start: 2020-12-09 — End: 2021-12-09

## 2020-12-09 MED ORDER — DEXTROMETHORPHAN POLISTIREX ER 30 MG/5 ML ORAL SUSP EXT.RELEASE 12HR
Freq: Two times a day (BID) | ORAL | 0 refills | 4 days | Status: CP
Start: 2020-12-09 — End: ?

## 2021-01-03 ENCOUNTER — Encounter: Admit: 2021-01-03 | Discharge: 2021-01-04 | Payer: MEDICARE

## 2021-01-03 DIAGNOSIS — R918 Other nonspecific abnormal finding of lung field: Principal | ICD-10-CM

## 2021-01-03 DIAGNOSIS — U071 Pneumonia due to COVID-19 virus: Principal | ICD-10-CM

## 2021-01-03 DIAGNOSIS — R0602 Shortness of breath: Principal | ICD-10-CM

## 2021-01-03 DIAGNOSIS — J1282 Pneumonia due to COVID-19 virus: Principal | ICD-10-CM

## 2021-01-06 ENCOUNTER — Encounter: Admit: 2021-01-06 | Discharge: 2021-01-07 | Payer: MEDICARE

## 2021-01-06 DIAGNOSIS — C8338 Diffuse large B-cell lymphoma, lymph nodes of multiple sites: Principal | ICD-10-CM

## 2021-01-06 DIAGNOSIS — K219 Gastro-esophageal reflux disease without esophagitis: Principal | ICD-10-CM

## 2021-01-06 DIAGNOSIS — F419 Anxiety disorder, unspecified: Principal | ICD-10-CM

## 2021-01-06 MED ORDER — OLANZAPINE 5 MG TABLET
ORAL_TABLET | Freq: Every evening | ORAL | 2 refills | 20 days | Status: CP
Start: 2021-01-06 — End: 2022-01-06

## 2021-01-06 MED ORDER — VENLAFAXINE ER 150 MG CAPSULE,EXTENDED RELEASE 24 HR
ORAL_CAPSULE | Freq: Every day | ORAL | 0 refills | 90 days | Status: CP
Start: 2021-01-06 — End: ?

## 2021-01-06 MED ORDER — ALPRAZOLAM 0.25 MG TABLET
ORAL_TABLET | Freq: Three times a day (TID) | ORAL | 0 refills | 20 days | Status: CP | PRN
Start: 2021-01-06 — End: 2022-01-06

## 2021-01-06 MED ORDER — OMEPRAZOLE 20 MG CAPSULE,DELAYED RELEASE
ORAL_CAPSULE | Freq: Every day | ORAL | 0 refills | 30 days | Status: CP
Start: 2021-01-06 — End: 2022-01-06

## 2021-03-11 DIAGNOSIS — F419 Anxiety disorder, unspecified: Principal | ICD-10-CM

## 2021-03-11 DIAGNOSIS — E1169 Type 2 diabetes mellitus with other specified complication: Principal | ICD-10-CM

## 2021-03-11 DIAGNOSIS — C8338 Diffuse large B-cell lymphoma, lymph nodes of multiple sites: Principal | ICD-10-CM

## 2021-03-11 MED ORDER — METFORMIN 500 MG TABLET
ORAL_TABLET | Freq: Two times a day (BID) | ORAL | 0 refills | 30 days | Status: CP
Start: 2021-03-11 — End: ?

## 2021-03-11 MED ORDER — OLANZAPINE 5 MG TABLET
ORAL_TABLET | Freq: Every evening | ORAL | 2 refills | 20 days | Status: CP
Start: 2021-03-11 — End: 2022-03-11

## 2021-03-11 MED ORDER — ALPRAZOLAM 0.25 MG TABLET
ORAL_TABLET | Freq: Three times a day (TID) | ORAL | 0 refills | 10 days | Status: CP | PRN
Start: 2021-03-11 — End: 2022-03-11

## 2021-03-11 MED ORDER — VENLAFAXINE ER 150 MG CAPSULE,EXTENDED RELEASE 24 HR
ORAL_CAPSULE | Freq: Every day | ORAL | 0 refills | 90.00000 days | Status: CP
Start: 2021-03-11 — End: ?

## 2021-03-15 DIAGNOSIS — C8338 Diffuse large B-cell lymphoma, lymph nodes of multiple sites: Principal | ICD-10-CM

## 2021-03-16 ENCOUNTER — Encounter: Admit: 2021-03-16 | Discharge: 2021-03-16 | Payer: MEDICARE

## 2021-03-16 DIAGNOSIS — C8338 Diffuse large B-cell lymphoma, lymph nodes of multiple sites: Principal | ICD-10-CM

## 2021-03-16 DIAGNOSIS — F419 Anxiety disorder, unspecified: Principal | ICD-10-CM

## 2021-03-16 MED ORDER — VENLAFAXINE ER 150 MG CAPSULE,EXTENDED RELEASE 24 HR
ORAL_CAPSULE | Freq: Every day | ORAL | 2 refills | 90 days | Status: CP
Start: 2021-03-16 — End: ?

## 2021-03-16 MED ORDER — OLANZAPINE 5 MG TABLET
ORAL_TABLET | Freq: Every evening | ORAL | 2 refills | 20 days | Status: CP
Start: 2021-03-16 — End: 2022-03-16

## 2021-03-20 IMAGING — CT CT ABD-PELV W/ CM
2 of 5 series · 16 of 46 positions shown, 18 images · IV contrast (APPLIED)
Comparison: 10/22/2014

CLINICAL DATA: Nausea and vomiting. Progressive abdominal pain in
the left lower quadrant. History of ulcerative colitis and colon
cancer with prior colectomy.

EXAM:
CT ABDOMEN AND PELVIS WITH CONTRAST
TECHNIQUE: Multidetector CT imaging of the abdomen and pelvis was performed
using the standard protocol following bolus administration of
intravenous contrast.
CONTRAST:  100mL OMNIPAQUE IOHEXOL 300 MG/ML  SOLN

[Series 2: routine abd/pel with · axial · 0.84mm/px · z∈[-1175,-725]mm · 13 of 102 slices shown, 15 images]
[im 6/102  soft-tissue]
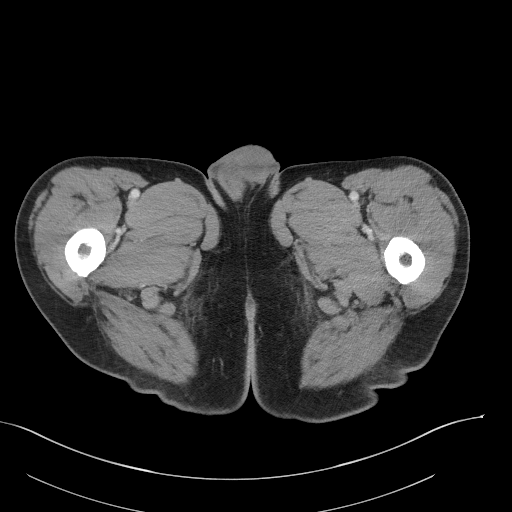
[im 6/102  bone]
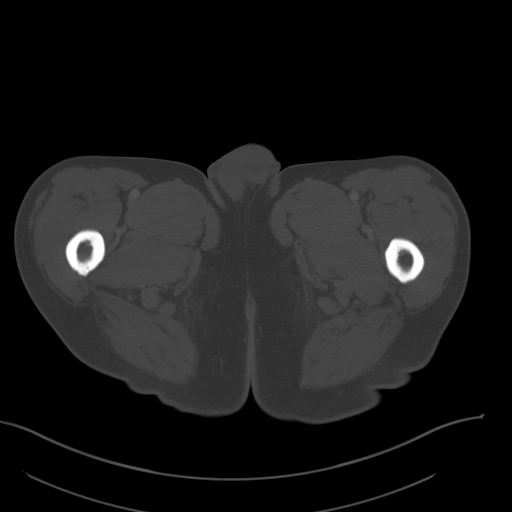
[im 16/102  soft-tissue]
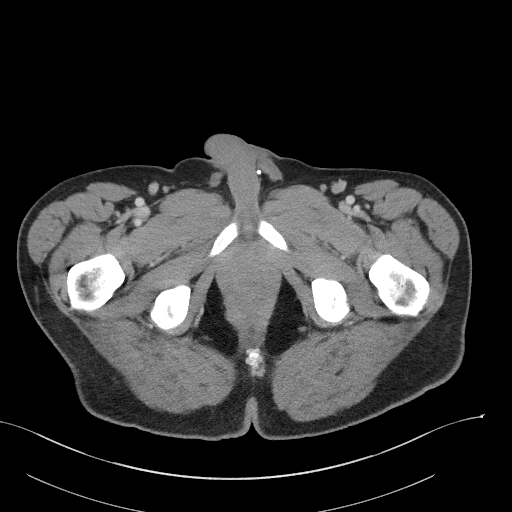
[im 21/102  soft-tissue]
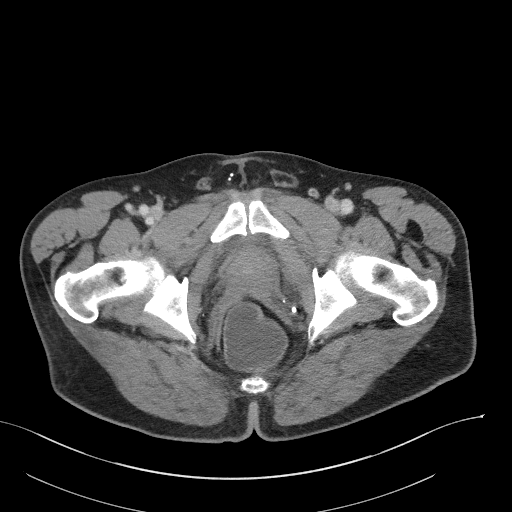
[im 31/102  soft-tissue]
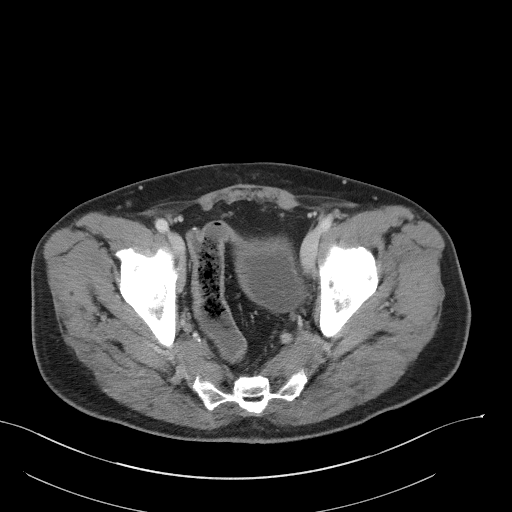
[im 36/102  soft-tissue]
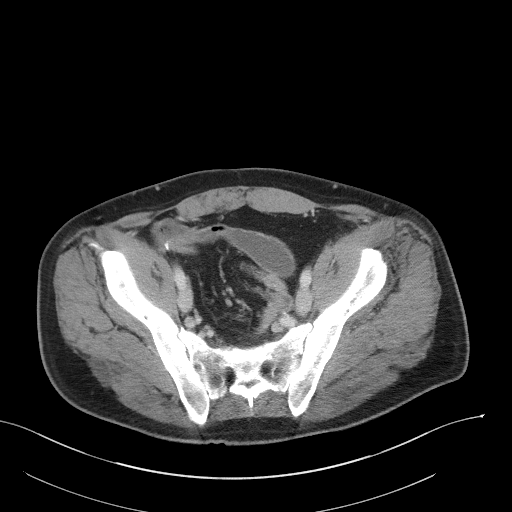
[im 46/102  soft-tissue]
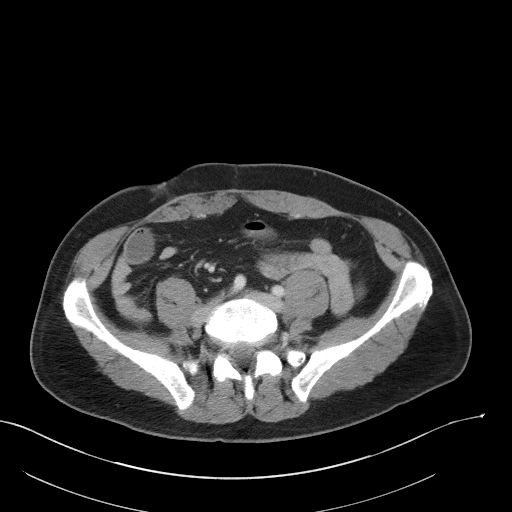
[im 51/102  soft-tissue]
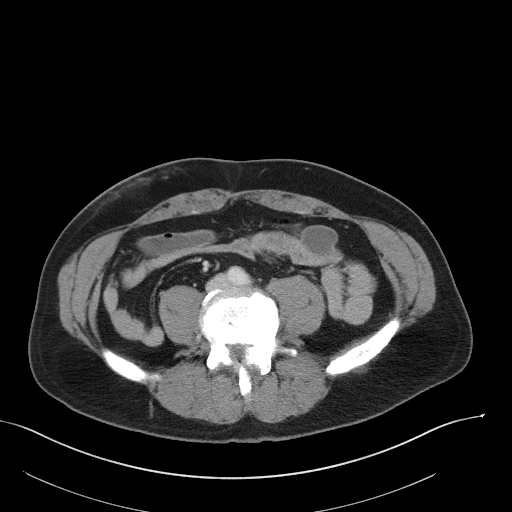
[im 56/102  soft-tissue]
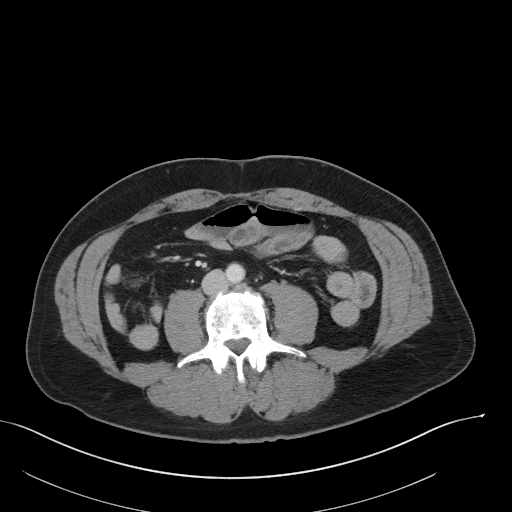
[im 66/102  soft-tissue]
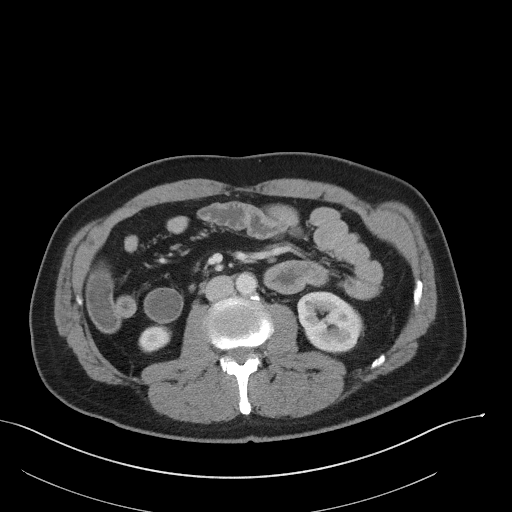
[im 66/102  bone]
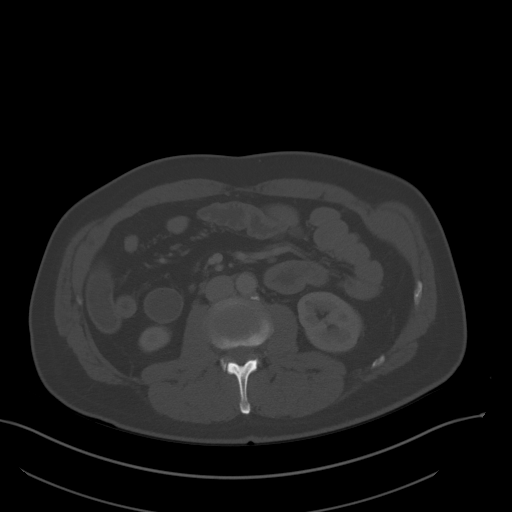
[im 71/102  soft-tissue]
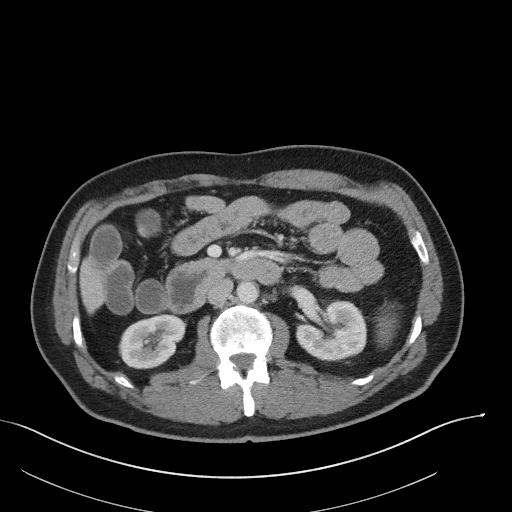
[im 81/102  soft-tissue]
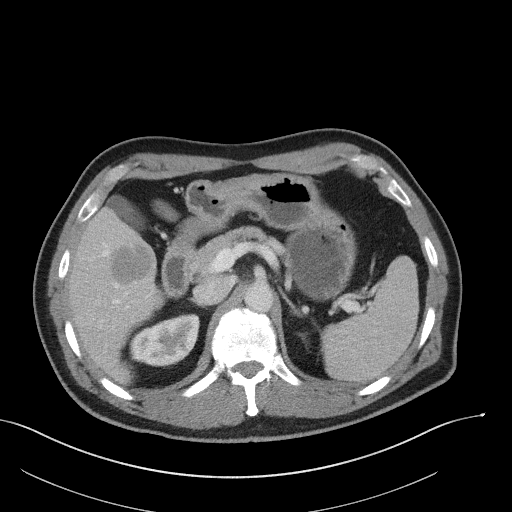
[im 86/102  soft-tissue]
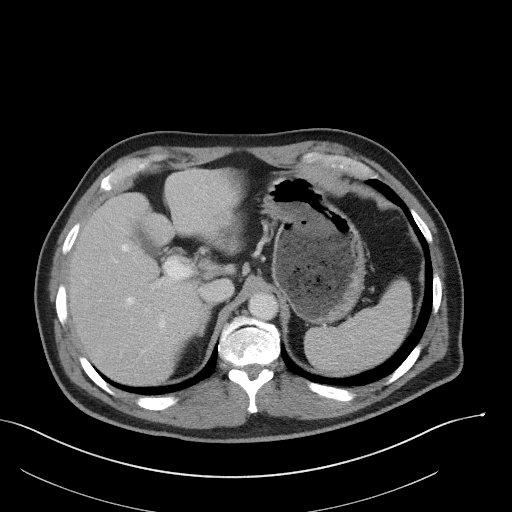
[im 96/102  soft-tissue]
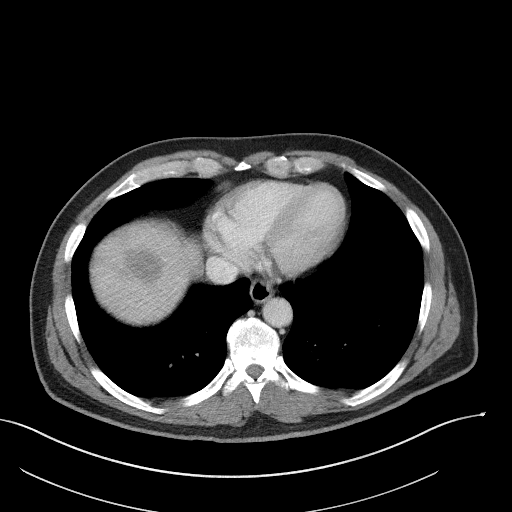

[Series 5: coronal st · coronal · 0.77mm/px · 3 of 83 slices shown]
[im 28/83  soft-tissue]
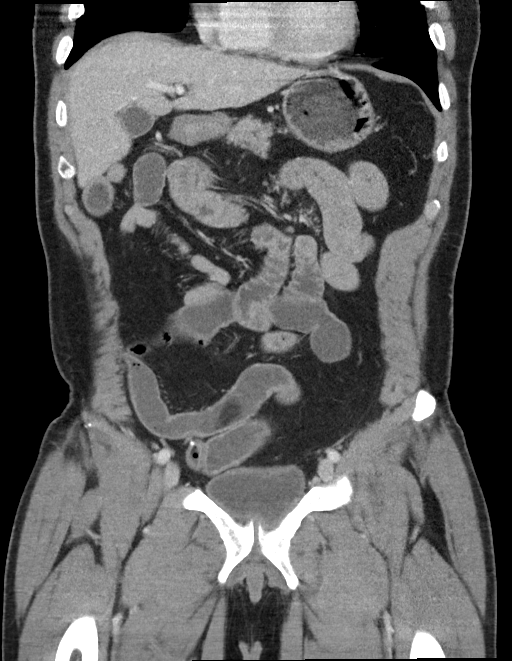
[im 37/83  soft-tissue]
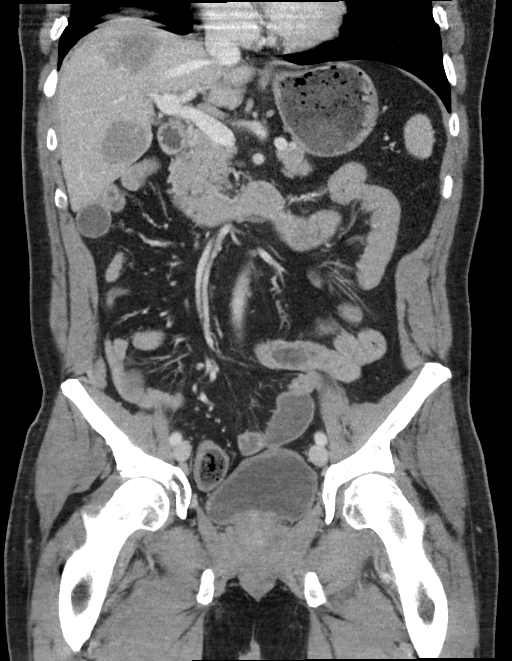
[im 46/83  soft-tissue]
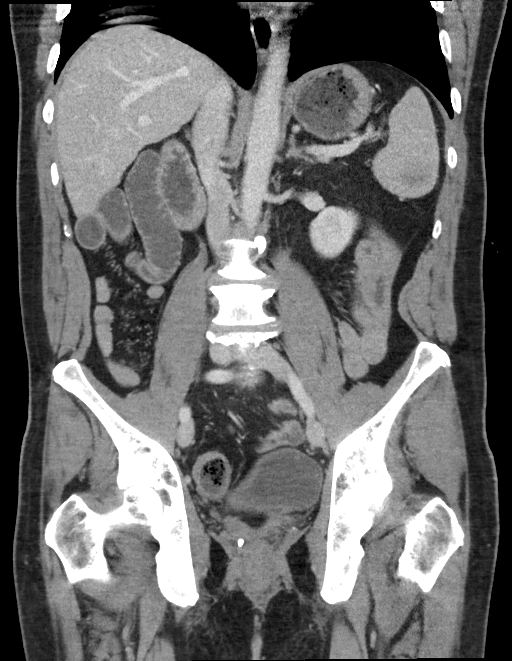

[16 of 46 positions shown; findings below may reference images not displayed]

FINDINGS: Lower chest: Minimal linear scarring in the lingula.

Hepatobiliary: New a hypoenhancing mass in segment 8 of the liver
measuring 4.9 by 3.9 cm on image [DATE]. New hypoenhancing mass in
segment 5 of the liver measuring 4.1 by 3.6 cm on image [DATE].

The gallbladder appears unremarkable.  No biliary dilatation.

Pancreas: Unremarkable

Spleen: New hypoenhancing 4.6 by 3.8 cm mass in the inferior spleen

Adrenals/Urinary Tract: Both adrenal glands appear normal. Urinary
bladder unremarkable. No significant renal abnormality is observed.

Stomach/Bowel: Borderline wall thickening in the transverse duodenum
proximally for example on image 33/2 although the appearance may
partially be due to nondistention. Colectomy. Fluid density and
rectal pouch with ileo anal anastomosis.

Vascular/Lymphatic: No pathologic adenopathy identified.

Reproductive: Borderline prostatomegaly.

Other: No supplemental non-categorized findings.

Musculoskeletal: Mild fatty prominence of the left spermatic cord.
Grade 1 degenerative anterolisthesis at L4-5. Lumbar spondylosis and
degenerative disc disease noted, with suspected impingement at the
L3-4 and L4-5 levels.
IMPRESSION: 1. Two new hypoenhancing lesions in the liver and a single new
hypoenhancing lesion in the spleen, concerning for possible
malignancy/metastatic disease. A specific primary is not identified.
2. Borderline wall thickening in the transverse duodenum proximally,
although the appearance may partially be due to nondistention.
3. Colectomy with ileoanal anastomosis.
4. Lumbar spondylosis and degenerative disc disease causing
impingement at L3-4 and L4-5 due to spondylosis and degenerative
disc disease.
5. Borderline prostatomegaly.

## 2021-06-15 ENCOUNTER — Ambulatory Visit: Admit: 2021-06-15 | Discharge: 2021-06-16 | Payer: MEDICARE

## 2021-06-15 ENCOUNTER — Other Ambulatory Visit: Admit: 2021-06-15 | Discharge: 2021-06-16 | Payer: MEDICARE

## 2021-06-15 DIAGNOSIS — C8338 Diffuse large B-cell lymphoma, lymph nodes of multiple sites: Principal | ICD-10-CM

## 2021-06-15 MED ORDER — PEN NEEDLE, DIABETIC 32 GAUGE X 5/32" (4 MM)
Freq: Every day | 5 refills | 30 days | Status: CP
Start: 2021-06-15 — End: 2021-06-15

## 2021-06-15 MED ORDER — INSULIN GLARGINE (U-100) 100 UNIT/ML SUBCUTANEOUS SOLUTION
Freq: Every evening | SUBCUTANEOUS | 5 refills | 33 days | Status: CP
Start: 2021-06-15 — End: 2021-06-15

## 2021-06-15 MED ORDER — INSULIN NPH ISOPHANE U-100 HUMAN 100 UNIT/ML SUBCUTANEOUS SUSPENSION
Freq: Two times a day (BID) | SUBCUTANEOUS | 0 refills | 33 days | Status: CP
Start: 2021-06-15 — End: 2021-06-15

## 2021-06-16 ENCOUNTER — Ambulatory Visit: Admit: 2021-06-16 | Discharge: 2021-06-16 | Payer: MEDICARE

## 2021-06-16 DIAGNOSIS — Z794 Long term (current) use of insulin: Principal | ICD-10-CM

## 2021-06-16 DIAGNOSIS — C8338 Diffuse large B-cell lymphoma, lymph nodes of multiple sites: Principal | ICD-10-CM

## 2021-06-16 DIAGNOSIS — E1340 Other specified diabetes mellitus with diabetic neuropathy, unspecified: Principal | ICD-10-CM

## 2021-06-16 DIAGNOSIS — E1165 Type 2 diabetes mellitus with hyperglycemia: Principal | ICD-10-CM

## 2021-06-16 MED ORDER — INSULIN GLARGINE (U-100) 100 UNIT/ML (3 ML) SUBCUTANEOUS PEN
Freq: Every evening | SUBCUTANEOUS | 3 refills | 30 days | Status: CP
Start: 2021-06-16 — End: 2021-07-16

## 2021-06-16 MED ORDER — INSULIN LISPRO (U-100) 100 UNIT/ML SUBCUTANEOUS SOLUTION
12 refills | 0.00000 days | Status: CP
Start: 2021-06-16 — End: ?

## 2021-06-16 MED ORDER — BLOOD-GLUCOSE METER KIT WRAPPER
11 refills | 0 days | Status: CP
Start: 2021-06-16 — End: 2022-06-16

## 2021-06-16 MED ORDER — BLOOD GLUCOSE TEST STRIPS
ORAL_STRIP | 11 refills | 0 days | Status: CP
Start: 2021-06-16 — End: 2022-06-16

## 2021-06-16 MED ORDER — LANCETS
11 refills | 0.00000 days | Status: CP
Start: 2021-06-16 — End: 2022-06-16

## 2021-06-19 ENCOUNTER — Ambulatory Visit: Admit: 2021-06-19 | Discharge: 2021-06-20 | Payer: MEDICARE

## 2021-06-19 DIAGNOSIS — R739 Hyperglycemia, unspecified: Principal | ICD-10-CM

## 2021-07-04 ENCOUNTER — Ambulatory Visit: Admit: 2021-07-04 | Discharge: 2021-07-05 | Payer: MEDICARE

## 2021-07-04 DIAGNOSIS — R918 Other nonspecific abnormal finding of lung field: Principal | ICD-10-CM

## 2021-07-06 DIAGNOSIS — J383 Other diseases of vocal cords: Principal | ICD-10-CM

## 2021-07-06 DIAGNOSIS — Z4659 Encounter for fitting and adjustment of other gastrointestinal appliance and device: Principal | ICD-10-CM

## 2021-07-06 DIAGNOSIS — R499 Unspecified voice and resonance disorder: Principal | ICD-10-CM

## 2021-08-01 ENCOUNTER — Ambulatory Visit: Admit: 2021-08-01 | Payer: MEDICARE | Attending: Speech-Language Pathologist | Primary: Speech-Language Pathologist

## 2021-08-01 ENCOUNTER — Ambulatory Visit: Admit: 2021-08-01 | Discharge: 2021-08-02 | Payer: MEDICARE | Attending: Otolaryngology | Primary: Otolaryngology

## 2021-08-09 DIAGNOSIS — J383 Other diseases of vocal cords: Principal | ICD-10-CM

## 2021-08-09 DIAGNOSIS — G4733 Obstructive sleep apnea (adult) (pediatric): Principal | ICD-10-CM

## 2021-08-29 ENCOUNTER — Institutional Professional Consult (permissible substitution): Admit: 2021-08-29 | Discharge: 2021-08-30 | Payer: MEDICARE | Attending: Otolaryngology | Primary: Otolaryngology

## 2021-09-01 ENCOUNTER — Encounter: Admit: 2021-09-01 | Discharge: 2021-09-01 | Payer: MEDICARE

## 2021-09-01 ENCOUNTER — Ambulatory Visit: Admit: 2021-09-01 | Discharge: 2021-09-01 | Payer: MEDICARE

## 2021-09-01 MED ORDER — OXYCODONE 5 MG TABLET
ORAL_TABLET | ORAL | 0 refills | 2 days | Status: CP | PRN
Start: 2021-09-01 — End: 2021-09-06
  Filled 2021-09-01: qty 10, 1d supply, fill #0

## 2021-09-12 ENCOUNTER — Ambulatory Visit: Admit: 2021-09-12 | Discharge: 2021-09-13 | Payer: MEDICARE | Attending: Otolaryngology | Primary: Otolaryngology

## 2021-09-26 DIAGNOSIS — C8338 Diffuse large B-cell lymphoma, lymph nodes of multiple sites: Principal | ICD-10-CM

## 2021-10-05 ENCOUNTER — Other Ambulatory Visit: Admit: 2021-10-05 | Discharge: 2021-10-06 | Payer: MEDICARE

## 2021-10-05 ENCOUNTER — Ambulatory Visit: Admit: 2021-10-05 | Discharge: 2021-10-06 | Payer: MEDICARE

## 2021-10-05 DIAGNOSIS — Z794 Long term (current) use of insulin: Principal | ICD-10-CM

## 2021-10-05 DIAGNOSIS — C8338 Diffuse large B-cell lymphoma, lymph nodes of multiple sites: Principal | ICD-10-CM

## 2021-10-05 DIAGNOSIS — I959 Hypotension, unspecified: Principal | ICD-10-CM

## 2021-10-05 DIAGNOSIS — F419 Anxiety disorder, unspecified: Principal | ICD-10-CM

## 2021-10-05 DIAGNOSIS — E1340 Other specified diabetes mellitus with diabetic neuropathy, unspecified: Principal | ICD-10-CM

## 2021-10-05 MED ORDER — OLANZAPINE 5 MG TABLET
ORAL_TABLET | Freq: Every evening | ORAL | 2 refills | 20.00000 days | Status: CP
Start: 2021-10-05 — End: 2022-10-05

## 2021-10-05 MED ORDER — VENLAFAXINE ER 150 MG CAPSULE,EXTENDED RELEASE 24 HR
ORAL_CAPSULE | Freq: Every day | ORAL | 2 refills | 90 days | Status: CP
Start: 2021-10-05 — End: ?

## 2021-10-05 MED ORDER — LORAZEPAM 0.5 MG TABLET
ORAL_TABLET | Freq: Two times a day (BID) | ORAL | 0 refills | 30 days | Status: CP | PRN
Start: 2021-10-05 — End: 2022-10-05

## 2021-10-26 ENCOUNTER — Ambulatory Visit: Admit: 2021-10-26 | Payer: MEDICARE | Attending: Otolaryngology | Primary: Otolaryngology

## 2021-10-29 DIAGNOSIS — F419 Anxiety disorder, unspecified: Principal | ICD-10-CM

## 2021-12-14 ENCOUNTER — Encounter: Payer: Self-pay | Admitting: Emergency Medicine

## 2021-12-14 ENCOUNTER — Emergency Department
Admission: EM | Admit: 2021-12-14 | Discharge: 2021-12-14 | Disposition: A | Discharge status: Home / Self Care | Payer: Medicare Other | Attending: Emergency Medicine | Admitting: Emergency Medicine

## 2021-12-14 ENCOUNTER — Emergency Department: Payer: Medicare Other

## 2021-12-14 ENCOUNTER — Other Ambulatory Visit: Payer: Self-pay

## 2021-12-14 DIAGNOSIS — R739 Hyperglycemia, unspecified: Secondary | ICD-10-CM

## 2021-12-14 DIAGNOSIS — R109 Unspecified abdominal pain: Secondary | ICD-10-CM

## 2021-12-14 DIAGNOSIS — E1165 Type 2 diabetes mellitus with hyperglycemia: Secondary | ICD-10-CM | POA: Diagnosis not present

## 2021-12-14 LAB — URINALYSIS, ROUTINE W REFLEX MICROSCOPIC
Bacteria, UA: NONE SEEN
Bilirubin Urine: NEGATIVE
Glucose, UA: >=500 mg/dL — AB
Hgb urine dipstick: NEGATIVE
Ketones, ur: NEGATIVE mg/dL
Leukocytes,Ua: NEGATIVE
Nitrite: NEGATIVE
Protein, ur: NEGATIVE mg/dL
Specific Gravity, Urine: 1.008 (ref 1.005–1.030)
Squamous Epithelial / HPF: NONE SEEN (ref 0–5)
pH: 5 (ref 5.0–8.0)

## 2021-12-14 LAB — COMPREHENSIVE METABOLIC PANEL
ALT: 43 U/L (ref 0–44)
AST: 44 U/L — ABNORMAL HIGH (ref 15–41)
Albumin: 3.7 g/dL (ref 3.5–5.0)
Alkaline Phosphatase: 284 U/L — ABNORMAL HIGH (ref 38–126)
Anion gap: 9 (ref 5–15)
BUN: 27 mg/dL — ABNORMAL HIGH (ref 8–23)
CO2: 24 mmol/L (ref 22–32)
Calcium: 9 mg/dL (ref 8.9–10.3)
Chloride: 91 mmol/L — ABNORMAL LOW (ref 98–111)
Creatinine, Ser: 1.26 mg/dL — ABNORMAL HIGH (ref 0.61–1.24)
GFR, Estimated: >60 mL/min (ref >60)
Glucose, Bld: 655 mg/dL (ref 70–99)
Potassium: 4.2 mmol/L (ref 3.5–5.1)
Sodium: 124 mmol/L — ABNORMAL LOW (ref 135–145)
Total Bilirubin: 0.9 mg/dL (ref 0.3–1.2)
Total Protein: 7.2 g/dL (ref 6.5–8.1)

## 2021-12-14 LAB — LIPASE, BLOOD: Lipase: 47 U/L (ref 11–51)

## 2021-12-14 LAB — CBC
HCT: 36 % — ABNORMAL LOW (ref 39.0–52.0)
Hemoglobin: 12.4 g/dL — ABNORMAL LOW (ref 13.0–17.0)
MCH: 27.4 pg (ref 26.0–34.0)
MCHC: 34.4 g/dL (ref 30.0–36.0)
MCV: 79.6 fL — ABNORMAL LOW (ref 80.0–100.0)
Platelets: 149 10*3/uL — ABNORMAL LOW (ref 150–400)
RBC: 4.52 MIL/uL (ref 4.22–5.81)
RDW: 12.8 % (ref 11.5–15.5)
WBC: 5.6 10*3/uL (ref 4.0–10.5)
nRBC: 0 % (ref 0.0–0.2)

## 2021-12-14 LAB — CBG MONITORING, ED
Glucose-Capillary: 444 mg/dL — ABNORMAL HIGH (ref 70–99)
Glucose-Capillary: 401 mg/dL — ABNORMAL HIGH (ref 70–99)

## 2021-12-14 MED ORDER — SODIUM CHLORIDE 0.9 % IV BOLUS
1000.0000 mL | Freq: Once | INTRAVENOUS | Status: AC
  Administered 2021-12-14: 1000 mL via INTRAVENOUS

## 2021-12-14 MED ORDER — INSULIN ASPART 100 UNIT/ML IJ SOLN
5.0000 [IU] | Freq: Once | INTRAMUSCULAR | Status: AC
  Administered 2021-12-14: 5 [IU] via INTRAVENOUS
  Filled 2021-12-14: qty 1

## 2021-12-14 MED ORDER — METFORMIN HCL 500 MG PO TABS
1000.0000 mg | ORAL_TABLET | Freq: Once | ORAL | Status: AC
Start: 2021-12-14 — End: 2021-12-14
  Administered 2021-12-14: 1000 mg via ORAL
  Filled 2021-12-14: qty 2

## 2021-12-14 MED ORDER — IOHEXOL 300 MG/ML  SOLN
100.0000 mL | Freq: Once | INTRAMUSCULAR | Status: AC | PRN
Start: 2021-12-14 — End: 2021-12-14
  Administered 2021-12-14: 100 mL via INTRAVENOUS
  Filled 2021-12-14: qty 100

## 2021-12-14 NOTE — ED Provider Notes (Signed)
? ?[facility] ?Provider Note ? ? ? Event Date/Time  ? First MD Initiated Contact with Patient 12/14/21 1332   ?  (approximate) ? ? ?History  ? ?Abdominal Pain ? ? ?HPI ? ?[name] is a 66 y.o. male  who, per [facility] oncology note has history of B cell lymphoma, ulcerative colitis, DM, who presents to the emergency department today because of concern for abdominal pain. The patient states the pain started 3 days ago. Located throughout the abdomen, however perhaps slightly worse on the right side. The patient has history of lymphoma and states that the pain today reminds him of the pain he had when he was first diagnosed with lymphoma. He has not had any fevers. No nausea or vomiting.   ? ? ?Physical Exam  ? ?Triage Vital Signs: ?ED Triage Vitals  ?Enc Vitals Group  ?   BP 12/14/21 1212 (!) 153/91  ?   Pulse Rate 12/14/21 1212 92  ?   Resp 12/14/21 1212 16  ?   Temp 12/14/21 1215 98.4 ?F (36.9 ?C)  ?   Temp src --   ?   SpO2 12/14/21 1212 100 %  ?   Weight 12/14/21 1215 187 lb (84.8 kg)  ?   Height 12/14/21 1215 5' 10"  (1.778 m)  ?   Head Circumference --   ?   Peak Flow --   ?   Pain Score 12/14/21 1215 7  ? ?Most recent vital signs: ?Vitals:  ? 12/14/21 1212 12/14/21 1215  ?BP: (!) 153/91   ?Pulse: 92   ?Resp: 16   ?Temp:  98.4 ?F (36.9 ?C)  ?SpO2: 100%   ? ? ?General: Awake, no distress.  ?CV:  Good peripheral perfusion.  ?Resp:  Normal effort.  ?Abd:  No distention. Mildly tender in the right abdomen. ? ? ?ED Results / Procedures / Treatments  ? ?Labs ?(all labs ordered are listed, but only abnormal results are displayed) ?Labs Reviewed  ?COMPREHENSIVE METABOLIC PANEL - Abnormal; Notable for the following components:  ?    Result Value  ? Sodium 124 (*)   ? Chloride 91 (*)   ? Glucose, Bld 655 (*)   ? BUN 27 (*)   ? Creatinine, Ser 1.26 (*)   ? AST 44 (*)   ? Alkaline Phosphatase 284 (*)   ? All other components within normal limits  ?CBC - Abnormal; Notable for the following components:   ? Hemoglobin 12.4 (*)   ? HCT 36.0 (*)   ? MCV 79.6 (*)   ? Platelets 149 (*)   ? All other components within normal limits  ?URINALYSIS, ROUTINE W REFLEX MICROSCOPIC - Abnormal; Notable for the following components:  ? Color, Urine COLORLESS (*)   ? APPearance CLEAR (*)   ? Glucose, UA >=500 (*)   ? All other components within normal limits  ?LIPASE, BLOOD  ? ? ? ?EKG ? ?None ? ? ?RADIOLOGY ?I independently interpreted and visualized the ct abd/pel. My interpretation: No free air ?Radiology interpretation:  ?   ?IMPRESSION:  ?1. Decreased size of known liver metastases.  ?2. Gallbladder sludge versus noncalcified gallstone. No evidence of  ?acute cholecystitis.  ?3. Otherwise no acute intra-abdominal or intrapelvic process.  ? ? ? ? ?PROCEDURES: ? ?Critical Care performed: No ? ?Procedures ? ? ?MEDICATIONS ORDERED IN ED: ?Medications - No data to display ? ? ?IMPRESSION / MDM / ASSESSMENT AND PLAN / ED COURSE  ?I reviewed the triage vital signs  and the nursing notes. ?             ?               ? ?Differential diagnosis includes, but is not limited to, cancer, perforation, colitis, gastroenteritis. ? ?Patient presented to the emergency department today because of concerns for abdominal pain.  Patient is primarily concerned because this is how he felt prior to his diagnosis of lymphoma.  No fevers.  Patient does state that his blood sugar while running high.  Blood sugar was extremely elevated here today although no anion gap.  I did obtain a CT scan which did not show any new lesions and in fact showed improvement of liver lesion.  I discussed this with the patient.  Additionally in terms of his elevated blood sugar he was given his home metformin as well as some insulin in IV fluids.  The patient did have improvement of his blood sugar.  When it was close to 400 I had a discussion with the patient.  He felt comfortable managing his sugars at home.  I did discuss with the patient importance of close monitoring  of his blood sugar and to make sure to follow-up with his primary care. ?  ? ? ?FINAL CLINICAL IMPRESSION(S) / ED DIAGNOSES  ? ?Final diagnoses:  ?Abdominal pain, unspecified abdominal location  ?Hyperglycemia  ? ? ? ?Note:  This document was prepared using Dragon voice recognition software and may include unintentional dictation errors. ? ?  ?Nance Pear, MD ?12/14/21 [place] [place] ? ?

## 2021-12-14 NOTE — Discharge Instructions (Signed)
Please seek medical attention for any high fevers, chest pain, shortness of breath, change in behavior, persistent vomiting, bloody stool or any other new or concerning symptoms.  

## 2021-12-14 NOTE — ED Triage Notes (Signed)
Pt here with abd pain. Pt was recently here for same and was transferred to [facility] where he was dx with lymphoma. Pt states pain is RLQ and radiates to his back and leg. Pt has diarrhea often due to another issue but denies N/V. Pt stable in triage. ?

## 2022-01-04 ENCOUNTER — Ambulatory Visit: Admit: 2022-01-04 | Discharge: 2022-01-04 | Payer: MEDICARE

## 2022-01-04 ENCOUNTER — Other Ambulatory Visit: Admit: 2022-01-04 | Discharge: 2022-01-04 | Payer: MEDICARE

## 2022-01-04 DIAGNOSIS — I959 Hypotension, unspecified: Principal | ICD-10-CM

## 2022-01-04 DIAGNOSIS — C8338 Diffuse large B-cell lymphoma, lymph nodes of multiple sites: Principal | ICD-10-CM

## 2022-01-08 ENCOUNTER — Ambulatory Visit: Admit: 2022-01-08 | Discharge: 2022-01-09 | Payer: MEDICARE

## 2022-01-08 DIAGNOSIS — C8330 Diffuse large B-cell lymphoma, unspecified site: Principal | ICD-10-CM

## 2022-01-10 DIAGNOSIS — F419 Anxiety disorder, unspecified: Principal | ICD-10-CM

## 2022-01-10 DIAGNOSIS — C8338 Diffuse large B-cell lymphoma, lymph nodes of multiple sites: Principal | ICD-10-CM

## 2022-01-11 DIAGNOSIS — F419 Anxiety disorder, unspecified: Principal | ICD-10-CM

## 2022-01-11 DIAGNOSIS — C8338 Diffuse large B-cell lymphoma, lymph nodes of multiple sites: Principal | ICD-10-CM

## 2022-03-16 ENCOUNTER — Emergency Department
Admission: EM | Admit: 2022-03-16 | Discharge: 2022-03-16 | Disposition: A | Discharge status: Home / Self Care | Payer: Medicare Other | Attending: Emergency Medicine | Admitting: Emergency Medicine

## 2022-03-16 ENCOUNTER — Encounter: Payer: Self-pay | Admitting: Emergency Medicine

## 2022-03-16 DIAGNOSIS — G4701 Insomnia due to medical condition: Secondary | ICD-10-CM

## 2022-03-16 DIAGNOSIS — G4700 Insomnia, unspecified: Secondary | ICD-10-CM | POA: Diagnosis not present

## 2022-03-16 DIAGNOSIS — F419 Anxiety disorder, unspecified: Secondary | ICD-10-CM | POA: Insufficient documentation

## 2022-03-16 DIAGNOSIS — R1032 Left lower quadrant pain: Secondary | ICD-10-CM | POA: Insufficient documentation

## 2022-03-16 LAB — URINALYSIS, ROUTINE W REFLEX MICROSCOPIC
Bilirubin Urine: NEGATIVE
Glucose, UA: 150 mg/dL — AB
Hgb urine dipstick: NEGATIVE
Ketones, ur: NEGATIVE mg/dL
Leukocytes,Ua: NEGATIVE
Nitrite: NEGATIVE
Protein, ur: NEGATIVE mg/dL
Specific Gravity, Urine: 1.012 (ref 1.005–1.030)
pH: 6 (ref 5.0–8.0)

## 2022-03-16 LAB — COMPREHENSIVE METABOLIC PANEL
ALT: 27 U/L (ref 0–44)
AST: 25 U/L (ref 15–41)
Albumin: 4.2 g/dL (ref 3.5–5.0)
Alkaline Phosphatase: 251 U/L — ABNORMAL HIGH (ref 38–126)
Anion gap: 8 (ref 5–15)
BUN: 19 mg/dL (ref 8–23)
CO2: 25 mmol/L (ref 22–32)
Calcium: 9.8 mg/dL (ref 8.9–10.3)
Chloride: 100 mmol/L (ref 98–111)
Creatinine, Ser: 1.32 mg/dL — ABNORMAL HIGH (ref 0.61–1.24)
GFR, Estimated: 59 mL/min — ABNORMAL LOW (ref >60)
Glucose, Bld: 303 mg/dL — ABNORMAL HIGH (ref 70–99)
Potassium: 4.2 mmol/L (ref 3.5–5.1)
Sodium: 133 mmol/L — ABNORMAL LOW (ref 135–145)
Total Bilirubin: 1.2 mg/dL (ref 0.3–1.2)
Total Protein: 7.7 g/dL (ref 6.5–8.1)

## 2022-03-16 LAB — CBC
HCT: 39.7 % (ref 39.0–52.0)
Hemoglobin: 13.8 g/dL (ref 13.0–17.0)
MCH: 27.7 pg (ref 26.0–34.0)
MCHC: 34.8 g/dL (ref 30.0–36.0)
MCV: 79.7 fL — ABNORMAL LOW (ref 80.0–100.0)
Platelets: 156 10*3/uL (ref 150–400)
RBC: 4.98 MIL/uL (ref 4.22–5.81)
RDW: 12.3 % (ref 11.5–15.5)
WBC: 6.4 10*3/uL (ref 4.0–10.5)
nRBC: 0 % (ref 0.0–0.2)

## 2022-03-16 LAB — LIPASE, BLOOD: Lipase: 44 U/L (ref 11–51)

## 2022-03-16 MED ORDER — HYDROXYZINE HCL 25 MG PO TABS
50.0000 mg | ORAL_TABLET | Freq: Once | ORAL | Status: AC
  Administered 2022-03-16: 50 mg via ORAL
  Filled 2022-03-16: qty 2

## 2022-03-16 MED ORDER — LORAZEPAM 0.5 MG PO TABS: 0.5000 mg | ORAL_TABLET | Freq: Two times a day (BID) | ORAL | 0 refills | Status: AC

## 2022-03-16 MED ORDER — LORAZEPAM 0.5 MG PO TABS: 0.5000 mg | ORAL_TABLET | Freq: Two times a day (BID) | ORAL | 0 refills | Status: DC

## 2022-03-16 MED ORDER — OLANZAPINE 5 MG PO TABS: 7.5000 mg | ORAL_TABLET | Freq: Every day | ORAL | 0 refills | Status: DC

## 2022-03-16 NOTE — ED Triage Notes (Signed)
Pt presents via POV with complaints of abdominal pain - he notes having "J-pouch-itis" for the last several days. 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 He also notes having a hx of anxiety which is exacerbated with the pain. Denies CP or SOB.

## 2022-04-06 ENCOUNTER — Ambulatory Visit: Admit: 2022-04-06 | Discharge: 2022-04-06 | Payer: MEDICARE

## 2022-04-07 ENCOUNTER — Encounter: Payer: Self-pay | Admitting: *Deleted

## 2022-04-07 ENCOUNTER — Inpatient Hospital Stay
Admission: EM | Admit: 2022-04-07 | Discharge: 2022-04-11 | DRG: 683 | Disposition: A | Discharge status: Home / Self Care | Payer: Medicare Other | Attending: Internal Medicine | Admitting: Internal Medicine

## 2022-04-07 ENCOUNTER — Other Ambulatory Visit: Payer: Self-pay

## 2022-04-07 DIAGNOSIS — K5190 Ulcerative colitis, unspecified, without complications: Secondary | ICD-10-CM | POA: Diagnosis present

## 2022-04-07 DIAGNOSIS — K91850 Pouchitis: Secondary | ICD-10-CM | POA: Diagnosis present

## 2022-04-07 DIAGNOSIS — E860 Dehydration: Secondary | ICD-10-CM | POA: Diagnosis present

## 2022-04-07 DIAGNOSIS — Z885 Allergy status to narcotic agent status: Secondary | ICD-10-CM

## 2022-04-07 DIAGNOSIS — I129 Hypertensive chronic kidney disease with stage 1 through stage 4 chronic kidney disease, or unspecified chronic kidney disease: Secondary | ICD-10-CM | POA: Diagnosis present

## 2022-04-07 DIAGNOSIS — K51214 Ulcerative (chronic) proctitis with abscess: Secondary | ICD-10-CM | POA: Diagnosis present

## 2022-04-07 DIAGNOSIS — E871 Hypo-osmolality and hyponatremia: Secondary | ICD-10-CM | POA: Diagnosis present

## 2022-04-07 DIAGNOSIS — F4320 Adjustment disorder, unspecified: Secondary | ICD-10-CM | POA: Diagnosis present

## 2022-04-07 DIAGNOSIS — R197 Diarrhea, unspecified: Secondary | ICD-10-CM

## 2022-04-07 DIAGNOSIS — Z800 Family history of malignant neoplasm of digestive organs: Secondary | ICD-10-CM | POA: Diagnosis not present

## 2022-04-07 DIAGNOSIS — E1165 Type 2 diabetes mellitus with hyperglycemia: Secondary | ICD-10-CM | POA: Diagnosis not present

## 2022-04-07 DIAGNOSIS — K219 Gastro-esophageal reflux disease without esophagitis: Secondary | ICD-10-CM | POA: Diagnosis present

## 2022-04-07 DIAGNOSIS — E1129 Type 2 diabetes mellitus with other diabetic kidney complication: Secondary | ICD-10-CM | POA: Diagnosis present

## 2022-04-07 DIAGNOSIS — N1831 Chronic kidney disease, stage 3a: Secondary | ICD-10-CM | POA: Diagnosis present

## 2022-04-07 DIAGNOSIS — F4310 Post-traumatic stress disorder, unspecified: Secondary | ICD-10-CM | POA: Diagnosis present

## 2022-04-07 DIAGNOSIS — F4322 Adjustment disorder with anxiety: Secondary | ICD-10-CM | POA: Diagnosis present

## 2022-04-07 DIAGNOSIS — E1122 Type 2 diabetes mellitus with diabetic chronic kidney disease: Secondary | ICD-10-CM | POA: Diagnosis present

## 2022-04-07 DIAGNOSIS — Z7984 Long term (current) use of oral hypoglycemic drugs: Secondary | ICD-10-CM

## 2022-04-07 DIAGNOSIS — I872 Venous insufficiency (chronic) (peripheral): Secondary | ICD-10-CM | POA: Diagnosis present

## 2022-04-07 DIAGNOSIS — Z882 Allergy status to sulfonamides status: Secondary | ICD-10-CM

## 2022-04-07 DIAGNOSIS — R739 Hyperglycemia, unspecified: Secondary | ICD-10-CM | POA: Diagnosis present

## 2022-04-07 DIAGNOSIS — K120 Recurrent oral aphthae: Secondary | ICD-10-CM | POA: Diagnosis present

## 2022-04-07 DIAGNOSIS — E861 Hypovolemia: Secondary | ICD-10-CM | POA: Diagnosis present

## 2022-04-07 DIAGNOSIS — Z79899 Other long term (current) drug therapy: Secondary | ICD-10-CM | POA: Diagnosis not present

## 2022-04-07 DIAGNOSIS — F410 Panic disorder [episodic paroxysmal anxiety] without agoraphobia: Secondary | ICD-10-CM | POA: Diagnosis present

## 2022-04-07 DIAGNOSIS — N179 Acute kidney failure, unspecified: Principal | ICD-10-CM

## 2022-04-07 DIAGNOSIS — I10 Essential (primary) hypertension: Secondary | ICD-10-CM | POA: Diagnosis present

## 2022-04-07 LAB — BASIC METABOLIC PANEL
Anion gap: 8 (ref 5–15)
Anion gap: 6 (ref 5–15)
BUN: 31 mg/dL — ABNORMAL HIGH (ref 8–23)
BUN: 30 mg/dL — ABNORMAL HIGH (ref 8–23)
CO2: 23 mmol/L (ref 22–32)
CO2: 22 mmol/L (ref 22–32)
Calcium: 9.3 mg/dL (ref 8.9–10.3)
Calcium: 8.9 mg/dL (ref 8.9–10.3)
Chloride: 102 mmol/L (ref 98–111)
Chloride: 104 mmol/L (ref 98–111)
Creatinine, Ser: 1.83 mg/dL — ABNORMAL HIGH (ref 0.61–1.24)
Creatinine, Ser: 1.69 mg/dL — ABNORMAL HIGH (ref 0.61–1.24)
GFR, Estimated: 40 mL/min — ABNORMAL LOW (ref >60)
GFR, Estimated: 44 mL/min — ABNORMAL LOW (ref >60)
Glucose, Bld: 257 mg/dL — ABNORMAL HIGH (ref 70–99)
Glucose, Bld: 210 mg/dL — ABNORMAL HIGH (ref 70–99)
Potassium: 3.6 mmol/L (ref 3.5–5.1)
Potassium: 4.3 mmol/L (ref 3.5–5.1)
Sodium: 133 mmol/L — ABNORMAL LOW (ref 135–145)
Sodium: 132 mmol/L — ABNORMAL LOW (ref 135–145)

## 2022-04-07 LAB — URINALYSIS, ROUTINE W REFLEX MICROSCOPIC
Bacteria, UA: NONE SEEN
Bilirubin Urine: NEGATIVE
Glucose, UA: >=500 mg/dL — AB
Hgb urine dipstick: NEGATIVE
Ketones, ur: NEGATIVE mg/dL
Leukocytes,Ua: NEGATIVE
Nitrite: NEGATIVE
Protein, ur: NEGATIVE mg/dL
Specific Gravity, Urine: 1.012 (ref 1.005–1.030)
Squamous Epithelial / HPF: NONE SEEN (ref 0–5)
WBC, UA: NONE SEEN WBC/hpf (ref 0–5)
pH: 6 (ref 5.0–8.0)

## 2022-04-07 LAB — GLUCOSE, CAPILLARY
Glucose-Capillary: 233 mg/dL — ABNORMAL HIGH (ref 70–99)
Glucose-Capillary: 207 mg/dL — ABNORMAL HIGH (ref 70–99)

## 2022-04-07 LAB — CBG MONITORING, ED
Glucose-Capillary: 320 mg/dL — ABNORMAL HIGH (ref 70–99)
Glucose-Capillary: 273 mg/dL — ABNORMAL HIGH (ref 70–99)
Glucose-Capillary: 120 mg/dL — ABNORMAL HIGH (ref 70–99)

## 2022-04-07 LAB — GASTROINTESTINAL PANEL BY PCR, STOOL (REPLACES STOOL CULTURE)

## 2022-04-07 LAB — CBC
HCT: 36.2 % — ABNORMAL LOW (ref 39.0–52.0)
Hemoglobin: 12.9 g/dL — ABNORMAL LOW (ref 13.0–17.0)
MCH: 28.2 pg (ref 26.0–34.0)
MCHC: 35.6 g/dL (ref 30.0–36.0)
MCV: 79 fL — ABNORMAL LOW (ref 80.0–100.0)
Platelets: 188 10*3/uL (ref 150–400)
RBC: 4.58 MIL/uL (ref 4.22–5.81)
RDW: 12.6 % (ref 11.5–15.5)
WBC: 7.4 10*3/uL (ref 4.0–10.5)
nRBC: 0 % (ref 0.0–0.2)

## 2022-04-07 LAB — COMPREHENSIVE METABOLIC PANEL
ALT: 36 U/L (ref 0–44)
AST: 34 U/L (ref 15–41)
Albumin: 4.1 g/dL (ref 3.5–5.0)
Alkaline Phosphatase: 316 U/L — ABNORMAL HIGH (ref 38–126)
Anion gap: 10 (ref 5–15)
BUN: 31 mg/dL — ABNORMAL HIGH (ref 8–23)
CO2: 20 mmol/L — ABNORMAL LOW (ref 22–32)
Calcium: 9.2 mg/dL (ref 8.9–10.3)
Chloride: 97 mmol/L — ABNORMAL LOW (ref 98–111)
Creatinine, Ser: 1.89 mg/dL — ABNORMAL HIGH (ref 0.61–1.24)
GFR, Estimated: 38 mL/min — ABNORMAL LOW (ref >60)
Glucose, Bld: 494 mg/dL — ABNORMAL HIGH (ref 70–99)
Potassium: 4.2 mmol/L (ref 3.5–5.1)
Sodium: 127 mmol/L — ABNORMAL LOW (ref 135–145)
Total Bilirubin: 0.9 mg/dL (ref 0.3–1.2)
Total Protein: 7.4 g/dL (ref 6.5–8.1)

## 2022-04-07 LAB — OSMOLALITY: Osmolality: 301 mOsm/kg — ABNORMAL HIGH (ref 275–295)

## 2022-04-07 LAB — SODIUM, URINE, RANDOM: Sodium, Ur: <10 mmol/L

## 2022-04-07 LAB — HEMOGLOBIN A1C
Hgb A1c MFr Bld: 13.4 % — ABNORMAL HIGH (ref 4.8–5.6)
Mean Plasma Glucose: 337.88 mg/dL

## 2022-04-07 LAB — OSMOLALITY, URINE: Osmolality, Ur: 248 mOsm/kg — ABNORMAL LOW (ref 300–900)

## 2022-04-07 LAB — C DIFFICILE QUICK SCREEN W PCR REFLEX
C Diff antigen: NEGATIVE
C Diff interpretation: NOT DETECTED
C Diff toxin: NEGATIVE

## 2022-04-07 LAB — HIV ANTIBODY (ROUTINE TESTING W REFLEX): HIV Screen 4th Generation wRfx: NONREACTIVE

## 2022-04-07 LAB — LIPASE, BLOOD: Lipase: 44 U/L (ref 11–51)

## 2022-04-07 MED ORDER — ALPRAZOLAM ER 0.5 MG PO TB24
0.5000 mg | ORAL_TABLET | Freq: Once | ORAL | Status: AC
  Administered 2022-04-07: 0.5 mg via ORAL
  Filled 2022-04-07: qty 1

## 2022-04-07 MED ORDER — LORAZEPAM 0.5 MG PO TABS
0.5000 mg | ORAL_TABLET | Freq: Three times a day (TID) | ORAL | Status: DC
  Administered 2022-04-07 – 2022-04-11 (×12): 0.5 mg via ORAL
  Filled 2022-04-07 (×12): qty 1

## 2022-04-07 MED ORDER — INSULIN ASPART 100 UNIT/ML IJ SOLN
5.0000 [IU] | Freq: Once | INTRAMUSCULAR | Status: AC
  Administered 2022-04-07: 5 [IU] via INTRAVENOUS
  Filled 2022-04-07: qty 1

## 2022-04-07 MED ORDER — PANTOPRAZOLE SODIUM 40 MG PO TBEC
40.0000 mg | DELAYED_RELEASE_TABLET | Freq: Every day | ORAL | Status: DC
  Administered 2022-04-07 – 2022-04-11 (×5): 40 mg via ORAL
  Filled 2022-04-07 (×5): qty 1

## 2022-04-07 MED ORDER — ENOXAPARIN SODIUM 40 MG/0.4ML IJ SOSY
40.0000 mg | PREFILLED_SYRINGE | INTRAMUSCULAR | Status: DC
  Administered 2022-04-07 – 2022-04-11 (×4): 40 mg via SUBCUTANEOUS
  Filled 2022-04-07 (×4): qty 0.4

## 2022-04-07 MED ORDER — MAGNESIUM HYDROXIDE 400 MG/5ML PO SUSP: 30.0000 mL | Freq: Every day | ORAL | Status: DC | PRN

## 2022-04-07 MED ORDER — INSULIN ASPART 100 UNIT/ML IJ SOLN
0.0000 [IU] | Freq: Three times a day (TID) | INTRAMUSCULAR | Status: DC
  Administered 2022-04-07: 15 [IU] via SUBCUTANEOUS
  Filled 2022-04-07: qty 1

## 2022-04-07 MED ORDER — HYDRALAZINE HCL 20 MG/ML IJ SOLN
5.0000 mg | INTRAMUSCULAR | Status: DC | PRN
Start: 2022-04-07 — End: 2022-04-11

## 2022-04-07 MED ORDER — ONDANSETRON HCL 4 MG/2ML IJ SOLN: 4.0000 mg | Freq: Four times a day (QID) | INTRAMUSCULAR | Status: DC | PRN

## 2022-04-07 MED ORDER — SODIUM CHLORIDE 0.9 % IV BOLUS
1000.0000 mL | Freq: Once | INTRAVENOUS | Status: AC
  Administered 2022-04-07: 1000 mL via INTRAVENOUS

## 2022-04-07 MED ORDER — INSULIN ASPART 100 UNIT/ML IJ SOLN
0.0000 [IU] | Freq: Three times a day (TID) | INTRAMUSCULAR | Status: DC
  Administered 2022-04-07: 3 [IU] via SUBCUTANEOUS
  Administered 2022-04-07: 5 [IU] via SUBCUTANEOUS
  Administered 2022-04-08: 7 [IU] via SUBCUTANEOUS
  Administered 2022-04-08: 3 [IU] via SUBCUTANEOUS
  Administered 2022-04-08: 1 [IU] via SUBCUTANEOUS
  Administered 2022-04-09: 9 [IU] via SUBCUTANEOUS
  Administered 2022-04-09: 5 [IU] via SUBCUTANEOUS
  Administered 2022-04-09: 1 [IU] via SUBCUTANEOUS
  Administered 2022-04-10: 2 [IU] via SUBCUTANEOUS
  Administered 2022-04-11: 5 [IU] via SUBCUTANEOUS
  Filled 2022-04-07 (×11): qty 1

## 2022-04-07 MED ORDER — VENLAFAXINE HCL ER 75 MG PO CP24
150.0000 mg | ORAL_CAPSULE | Freq: Every day | ORAL | Status: DC
  Administered 2022-04-07 – 2022-04-11 (×5): 150 mg via ORAL
  Filled 2022-04-07 (×4): qty 2
  Filled 2022-04-07: qty 1

## 2022-04-07 MED ORDER — INSULIN ASPART 100 UNIT/ML IJ SOLN: 0.0000 [IU] | Freq: Three times a day (TID) | INTRAMUSCULAR | Status: DC

## 2022-04-07 MED ORDER — LOPERAMIDE HCL 2 MG PO CAPS: 2.0000 mg | ORAL_CAPSULE | ORAL | Status: DC | PRN

## 2022-04-07 MED ORDER — LOPERAMIDE HCL 2 MG PO CAPS
2.0000 mg | ORAL_CAPSULE | Freq: Two times a day (BID) | ORAL | Status: DC
  Administered 2022-04-07 – 2022-04-08 (×4): 2 mg via ORAL
  Filled 2022-04-07 (×4): qty 1

## 2022-04-07 MED ORDER — INSULIN ASPART 100 UNIT/ML IJ SOLN
0.0000 [IU] | Freq: Every day | INTRAMUSCULAR | Status: DC
  Administered 2022-04-07 – 2022-04-09 (×2): 2 [IU] via SUBCUTANEOUS
  Administered 2022-04-10: 3 [IU] via SUBCUTANEOUS
  Filled 2022-04-07 (×3): qty 1

## 2022-04-07 MED ORDER — OLANZAPINE 7.5 MG PO TABS
7.5000 mg | ORAL_TABLET | Freq: Every day | ORAL | Status: DC
  Administered 2022-04-07 – 2022-04-10 (×4): 7.5 mg via ORAL
  Filled 2022-04-07 (×5): qty 1

## 2022-04-07 MED ORDER — LORAZEPAM 0.5 MG PO TABS
0.5000 mg | ORAL_TABLET | Freq: Two times a day (BID) | ORAL | Status: DC
  Administered 2022-04-07: 0.5 mg via ORAL
  Filled 2022-04-07: qty 1

## 2022-04-07 MED ORDER — ACETAMINOPHEN 650 MG RE SUPP: 650.0000 mg | Freq: Four times a day (QID) | RECTAL | Status: DC | PRN

## 2022-04-07 MED ORDER — TRAZODONE HCL 50 MG PO TABS
25.0000 mg | ORAL_TABLET | Freq: Every evening | ORAL | Status: DC | PRN
  Administered 2022-04-09: 25 mg via ORAL
  Filled 2022-04-07: qty 1

## 2022-04-07 MED ORDER — ACETAMINOPHEN 325 MG PO TABS
650.0000 mg | ORAL_TABLET | Freq: Four times a day (QID) | ORAL | Status: DC | PRN
  Administered 2022-04-08: 650 mg via ORAL
  Filled 2022-04-07: qty 2

## 2022-04-07 MED ORDER — MUPIROCIN 2 % EX OINT
1.0000 | TOPICAL_OINTMENT | Freq: Every day | CUTANEOUS | Status: DC
  Administered 2022-04-07 – 2022-04-11 (×5): 1 via TOPICAL
  Filled 2022-04-07: qty 22

## 2022-04-07 MED ORDER — INSULIN GLARGINE-YFGN 100 UNIT/ML ~~LOC~~ SOLN
35.0000 [IU] | Freq: Every day | SUBCUTANEOUS | Status: DC
  Administered 2022-04-07 – 2022-04-09 (×3): 35 [IU] via SUBCUTANEOUS
  Filled 2022-04-07 (×3): qty 0.35

## 2022-04-07 MED ORDER — ONDANSETRON HCL 4 MG PO TABS: 4.0000 mg | ORAL_TABLET | Freq: Four times a day (QID) | ORAL | Status: DC | PRN

## 2022-04-07 MED ORDER — EMPAGLIFLOZIN 10 MG PO TABS
10.0000 mg | ORAL_TABLET | Freq: Every day | ORAL | Status: DC
  Filled 2022-04-07: qty 1

## 2022-04-07 MED ORDER — SODIUM CHLORIDE 0.9 % IV SOLN: INTRAVENOUS | Status: DC

## 2022-04-07 MED ORDER — FENTANYL CITRATE PF 50 MCG/ML IJ SOSY: 12.5000 ug | PREFILLED_SYRINGE | INTRAMUSCULAR | Status: DC | PRN

## 2022-04-07 NOTE — ED Triage Notes (Signed)
Pt stating lower abdominal discomfort since that started tonight. Denies nausea. States that he has a J-pouch and his stool are normally loose, however over the past 2 months or so his stools are "more watery" and frequent.

## 2022-04-07 NOTE — H&P (Addendum)
History and Physical    [name] CVE:[id number] DOB: [birth year] DOA: 04/07/2022  Referring MD/NP/PA:   PCP: Theotis Burrow, MD   Patient coming from:  The patient is coming from home.  At baseline, pt is independent for most of ADL.        Chief Complaint: Diarrhea  HPI: [name] is a 66 y.o. 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  Patient states that he has chronic intermittent diarrhea, but his diarrhea has worsened since last night.  Patient has had at least 7 times of watery diarrhea, no blood in the stool.  He feels lightheaded.  Initially he reported to ED physician that he had some crampy abdominal pain, but currently denies abdominal pain to me.  Denies nausea and vomiting.  No fever or chills.  Denies chest pain, cough, shortness breath.  Denies symptoms of UTI.  Patient is very anxious during the interview.  Data reviewed independently and ED Course: pt was found to have WBC 7.4, BNP 44, negative urinalysis, worsening renal function with creatinine 1.89, BUN 31 (baseline creatinine 1.2-1.3), temperature normal, blood pressure 127/84, heart rate 109, RR 22, oxygen saturation 98% on room air.   EKG: I have personally reviewed.  Sinus rhythm, QTc 434, LAD, anteroseptal infarction pattern, T wave inversion in inferior leads.   Review of Systems:   General: no fevers, chills, no body weight gain, has poor appetite, has fatigue HEENT: no blurry vision, hearing changes or sore throat Respiratory: no dyspnea, coughing, wheezing CV: no chest pain, no palpitations GI: no nausea, vomiting, abdominal pain, has diarrhea, no constipation GU: no dysuria, burning on urination, increased urinary  frequency, hematuria  Ext: no leg edema Neuro: no unilateral weakness, numbness, or tingling, no vision change or hearing loss Skin: no rash, no skin tear. MSK: No muscle spasm, no deformity, no limitation of range of movement in spin Heme: No easy bruising.  Travel history: No recent long distant travel.   Allergy:  Allergies  Allergen Reactions   Sulfa Antibiotics Rash and Hives   Oxycodone Other (See Comments) and Rash    Rash and headache    Past Medical History:  Diagnosis Date   Cancer ([place])    Diabetes mellitus without complication ([place])     Past Surgical History:  Procedure Laterality Date   OTHER SURGICAL HISTORY     J pouch 1997, history of ileostomy with reversal    Social History:  reports that he has never smoked. He has never used smokeless tobacco. He reports that he does not drink alcohol and does not use drugs.  Family History:  Family History  Problem Relation Age of Onset   Colon cancer Sister    Crohn's disease Brother      Prior to Admission medications   Medication Sig Start Date End Date Taking? Authorizing Provider  JARDIANCE 10 MG TABS tablet Take 10 mg by mouth daily. 03/28/22  Yes [provider]  LANTUS SOLOSTAR 100 UNIT/ML Solostar Pen Inject 35 Units into the skin at bedtime. 09/29/19  Yes [provider]  LORazepam (ATIVAN) 0.5 MG tablet Take 1 tablet (0.5 mg total) by mouth 2 (two) times daily. 03/16/22 04/15/22 Yes Naaman Plummer, MD  metFORMIN (GLUCOPHAGE) 1000 MG tablet Take 1,000 mg by mouth 2 (two)  times daily. 04/08/17  Yes [provider]  mupirocin ointment (BACTROBAN) 2 % Apply 1 Application topically daily.   Yes [provider]  OLANZapine (ZYPREXA) 5 MG tablet Take 1.5 tablets (7.5 mg total) by mouth at bedtime. 03/16/22 04/15/22 Yes Naaman Plummer, MD  omeprazole (PRILOSEC) 40 MG capsule Take 40 mg by mouth daily.    Yes [provider]  venlafaxine XR (EFFEXOR-XR) 150 MG 24 hr capsule  Take 150 mg by mouth daily. 03/20/17  Yes [provider]    Physical Exam: Vitals:   04/07/22 0630 04/07/22 0831 04/07/22 1000 04/07/22 1346  BP: 113/72 127/84 129/86 116/84  Pulse: 97 98 97 91  Resp: 13 18 (!) 21 18  Temp:    98.5 F (36.9 C)  TempSrc:    Oral  SpO2: 98% 98% 96% 100%  Weight:      Height:       General: Not in acute distress.  Dry mucous membrane.  Patient has anxiety. HEENT:       Eyes: PERRL, EOMI, no scleral icterus.       ENT: No discharge from the ears and nose, no pharynx injection, no tonsillar enlargement.        Neck: No JVD, no bruit, no mass felt. Heme: No neck lymph node enlargement. Cardiac: S1/S2, RRR, No murmurs, No gallops or rubs. Respiratory: No rales, wheezing, rhonchi or rubs. GI: Soft, nondistended, nontender, no rebound pain, no organomegaly, BS present. GU: No hematuria Ext: No pitting leg edema bilaterally. 1+DP/PT pulse bilaterally. Musculoskeletal: No joint deformities, No joint redness or warmth, no limitation of ROM in spin. Skin: No rashes.  Neuro: Alert, oriented X3, cranial nerves II-XII grossly intact, moves all extremities normally.  Psych: Patient is not psychotic, no suicidal or hemocidal ideation.  Labs on Admission: I have personally reviewed following labs and imaging studies  CBC: Recent Labs  Lab 04/07/22 0037  WBC 7.4  HGB 12.9*  HCT 36.2*  MCV 79.0*  PLT 160   Basic Metabolic Panel: Recent Labs  Lab 04/07/22 0037 04/07/22 0924  NA 127* 133*  K 4.2 3.6  CL 97* 102  CO2 20* 23  GLUCOSE 494* 257*  BUN 31* 31*  CREATININE 1.89* 1.83*  CALCIUM 9.2 9.3   GFR: Estimated Creatinine Clearance: 40.4 mL/min (A) (by C-G formula based on SCr of 1.83 mg/dL (H)). Liver Function Tests: Recent Labs  Lab 04/07/22 0037  AST 34  ALT 36  ALKPHOS 316*  BILITOT 0.9  PROT 7.4  ALBUMIN 4.1   Recent Labs  Lab 04/07/22 0037  LIPASE 44   No results for input(s): "AMMONIA" in the last 168  hours. Coagulation Profile: No results for input(s): "INR", "PROTIME" in the last 168 hours. Cardiac Enzymes: No results for input(s): "CKTOTAL", "CKMB", "CKMBINDEX", "TROPONINI" in the last 168 hours. BNP (last 3 results) No results for input(s): "PROBNP" in the last 8760 hours. HbA1C: Recent Labs    04/07/22 0652  HGBA1C 13.4*   CBG: Recent Labs  Lab 04/07/22 0725 04/07/22 0900 04/07/22 1132  GLUCAP 320* 273* 120*   Lipid Profile: No results for input(s): "CHOL", "HDL", "LDLCALC", "TRIG", "CHOLHDL", "LDLDIRECT" in the last 72 hours. Thyroid Function Tests: No results for input(s): "TSH", "T4TOTAL", "FREET4", "T3FREE", "THYROIDAB" in the last 72 hours. Anemia Panel: No results for input(s): "VITAMINB12", "FOLATE", "FERRITIN", "TIBC", "IRON", "RETICCTPCT" in the last 72 hours. Urine analysis:    Component Value Date/Time   COLORURINE COLORLESS (A) 04/07/2022 0036  APPEARANCEUR CLEAR (A) 04/07/2022 0036   APPEARANCEUR Clear 10/22/2014 1107   LABSPEC 1.012 04/07/2022 0036   LABSPEC 1.015 10/22/2014 1107   PHURINE 6.0 04/07/2022 0036   GLUCOSEU >=500 (A) 04/07/2022 0036   GLUCOSEU Negative 10/22/2014 1107   HGBUR NEGATIVE 04/07/2022 0036   BILIRUBINUR NEGATIVE 04/07/2022 0036   BILIRUBINUR Negative 10/22/2014 1107   KETONESUR NEGATIVE 04/07/2022 0036   PROTEINUR NEGATIVE 04/07/2022 0036   NITRITE NEGATIVE 04/07/2022 0036   LEUKOCYTESUR NEGATIVE 04/07/2022 0036   LEUKOCYTESUR Negative 10/22/2014 1107   Sepsis Labs: @LABRCNTIP (procalcitonin:4,lacticidven:4) ) Recent Results (from the past 240 hour(s))  C Difficile Quick Screen w PCR reflex     Status: None   Collection Time: 04/07/22  9:24 AM   Specimen: STOOL  Result Value Ref Range Status   C Diff antigen NEGATIVE NEGATIVE Final   C Diff toxin NEGATIVE NEGATIVE Final   C Diff interpretation No C. difficile detected.  Final    Comment: Performed at [facility], [address] [place], [place]  [id number]  Gastrointestinal Panel by PCR , Stool     Status: None   Collection Time: 04/07/22  9:24 AM   Specimen: Stool  Result Value Ref Range Status   Campylobacter species NOT DETECTED NOT DETECTED Final   Plesimonas shigelloides NOT DETECTED NOT DETECTED Final   Salmonella species NOT DETECTED NOT DETECTED Final   Yersinia enterocolitica NOT DETECTED NOT DETECTED Final   Vibrio species NOT DETECTED NOT DETECTED Final   Vibrio cholerae NOT DETECTED NOT DETECTED Final   Enteroaggregative E coli (EAEC) NOT DETECTED NOT DETECTED Final   Enteropathogenic E coli (EPEC) NOT DETECTED NOT DETECTED Final   Enterotoxigenic E coli (ETEC) NOT DETECTED NOT DETECTED Final   Shiga like toxin producing E coli (STEC) NOT DETECTED NOT DETECTED Final   Shigella/Enteroinvasive E coli (EIEC) NOT DETECTED NOT DETECTED Final   Cryptosporidium NOT DETECTED NOT DETECTED Final   Cyclospora cayetanensis NOT DETECTED NOT DETECTED Final   Entamoeba histolytica NOT DETECTED NOT DETECTED Final   Giardia lamblia NOT DETECTED NOT DETECTED Final   Adenovirus F40/41 NOT DETECTED NOT DETECTED Final   Astrovirus NOT DETECTED NOT DETECTED Final   Norovirus GI/GII NOT DETECTED NOT DETECTED Final   Rotavirus A NOT DETECTED NOT DETECTED Final   Sapovirus (I, II, IV, and V) NOT DETECTED NOT DETECTED Final    Comment: Performed at [facility], [address] [place], [place] [address]     Radiological Exams on Admission: No results found.    Assessment/Plan Principal Problem:   Acute renal failure superimposed on stage 3a chronic kidney disease (HCC) Active Problems:   Hyponatremia   Diarrhea   UC (ulcerative colitis) ([place])   Type II diabetes mellitus with renal manifestations (HCC)   Adjustment disorder with anxiety   Panic attack   Essential hypertension   Assessment and Plan: * Acute renal failure superimposed on stage 3a chronic kidney disease ([place]) Baseline creatinine 1.2-11.3.  Her creatinine  is 1.89, BUN 31, GFR 38.  Likely due to dehydration secondary to diarrhea.  -Admitted to telemetry bed as inpatient -IV fluid: 1 L normal saline, followed by 75 cc/h -Avoid using renal toxic medications   Hyponatremia Sodium 127, likely due to GI loss.  - Will check urine sodium, urine osmolality, serum osmolality. - Fluid restriction - IVF: 1L NS in ED, will continue with IV normal saline at 75 mL/h - f/u by BMP q8h - avoid over correction too fast due to  risk of central pontine myelinolysis   Addendum: the repeated the BMP 9:24 showed sodium 133 (Na was 127 at 00: 37).  -will d/C IV NS now   Diarrhea Etiology is not clear.  Currently patient does not have abdominal pain.  No fever or leukocytosis.  C diff test negative.  GI pathogen panel negative. -Supportive care -As needed Zofran -Imodium 2 mg twice daily -IV fluid as above   UC (ulcerative colitis) ([place]) -s/p of cholectomy and J-pouch  Type II diabetes mellitus with renal manifestations (HCC) Recent A1c 9.8.  Blood sugar 494 --> 320.  Anion gap normal.  No DKA.  Patient is taking metformin, Jardiance, glargine insulin 35 units daily -Glargine insulin 35 units daily -sliding scale insulin  Adjustment disorder with anxiety Adjustment disorder with anxiety, panic disorder -Ativan 0.5 mg 3 times daily -Continue home olanzapine and Effexor -Give 1 dose of Xanax 0.5 mg  Panic attack -see abvoe  Essential hypertension Blood pressure 127/84.  Patient is not taking medications currently -IV hydralazine as needed          DVT ppx: SQ Lovenox  Code Status: Full code  Family Communication:  I spoke to his son by phone  Disposition Plan:  Anticipate discharge back to previous environment  Consults called:  none  Admission status and Level of care: Telemetry Medical:  as inpt    Severity of Illness:  The appropriate patient status for this patient is INPATIENT. Inpatient status is judged to be  reasonable and necessary in order to provide the required intensity of service to ensure the patient's safety. The patient's presenting symptoms, physical exam findings, and initial radiographic and laboratory data in the context of their chronic comorbidities is felt to place them at high risk for further clinical deterioration. Furthermore, it is not anticipated that the patient will be medically stable for discharge from the hospital within 2 midnights of admission.   * I certify that at the point of admission it is my clinical judgment that the patient will require inpatient hospital care spanning beyond 2 midnights from the point of admission due to high intensity of service, high risk for further deterioration and high frequency of surveillance required.*       Date of Service 04/07/2022    Ivor Costa [facility]   If 7PM-7AM, please contact night-coverage [email or web address] 04/07/2022, 2:11 PM

## 2022-04-07 NOTE — Assessment & Plan Note (Addendum)
Sodium 127, likely due to GI loss.  - Will check urine sodium, urine osmolality, serum osmolality. - Fluid restriction - IVF: 1L NS in ED, will continue with IV normal saline at 75 mL/h - f/u by BMP q8h - avoid over correction too fast due to risk of central pontine myelinolysis   Addendum: the repeated the BMP 9:24 showed sodium 133 (Na was 127 at 00: 37).  -will d/C IV NS now

## 2022-04-07 NOTE — Assessment & Plan Note (Signed)
Baseline creatinine 1.2-11.3.  Her creatinine is 1.89, BUN 31, GFR 38.  Likely due to dehydration secondary to diarrhea.  -Admitted to telemetry bed as inpatient -IV fluid: 1 L normal saline, followed by 75 cc/h -Avoid using renal toxic medications

## 2022-04-07 NOTE — Assessment & Plan Note (Signed)
Recent A1c 9.8.  Blood sugar 494 --> 320.  Anion gap normal.  No DKA.  Patient is taking metformin, Jardiance, glargine insulin 35 units daily -Glargine insulin 35 units daily -sliding scale insulin

## 2022-04-07 NOTE — Assessment & Plan Note (Signed)
Adjustment disorder with anxiety, panic disorder -Ativan 0.5 mg 3 times daily -Continue home olanzapine and Effexor -Give 1 dose of Xanax 0.5 mg

## 2022-04-07 NOTE — Progress Notes (Signed)
Patient received from ER via w/c in stable condition. Patient very anxious appearing, moving around frequently and unable to sit still. Eyes noted to be darting around room when speaking. Slow, calculated responses to questions. Steady gait noted. Denies any pain or discomfort at present.

## 2022-04-07 NOTE — ED Notes (Signed)
ED TO INPATIENT HANDOFF REPORT  ED Nurse Name and Phone #: Baxter Flattery, RN  S Name/Age/Gender [name] 66 y.o. male Room/Bed: ED37A/ED37A  Code Status   Code Status: Full Code  Home/SNF/Other Home Patient oriented to: self, place, time, and situation Is this baseline? Yes   Triage Complete: Triage complete  Chief Complaint AKI (acute kidney injury) ([place]) [N17.9]  Triage Note Pt stating lower abdominal discomfort since that started tonight. Denies nausea. States that he has a J-pouch and his stool are normally loose, however over the past 2 months or so his stools are "more watery" and frequent.    Allergies Allergies  Allergen Reactions   Sulfa Antibiotics Rash and Hives   Oxycodone Other (See Comments) and Rash    Rash and headache    Level of Care/Admitting Diagnosis ED Disposition     ED Disposition  Admit   Condition  --   [place]: [place] [[id number]]  Level of Care: Telemetry Medical [104]  Covid Evaluation: Asymptomatic - no recent exposure (last 10 days) testing not required  Diagnosis: AKI (acute kidney injury) [facility]) [[id number]]  Admitting Physician: Christel Mormon [[id number]]  Attending Physician: Christel Mormon [[id number]]  Certification:: I certify this patient will need inpatient services for at least 2 midnights  Estimated Length of Stay: 2          B Medical/Surgery History Past Medical History:  Diagnosis Date   Cancer ([place])    Diabetes mellitus without complication ([place])    Past Surgical History:  Procedure Laterality Date   OTHER SURGICAL HISTORY     J pouch 1997, history of ileostomy with reversal     A IV Location/Drains/Wounds Patient Lines/Drains/Airways Status     Active Line/Drains/Airways     Name Placement date Placement time Site Days   Peripheral IV 04/07/22 22 G Anterior;Proximal;Right Forearm 04/07/22  0727  Forearm  less than 1            Intake/Output Last 24 hours No intake or  output data in the 24 hours ending 04/07/22 1522  Labs/Imaging Results for orders placed or performed during the hospital encounter of 04/07/22 (from the past 48 hour(s))  Urinalysis, Routine w reflex microscopic Urine, Clean Catch     Status: Abnormal   Collection Time: 04/07/22 12:36 AM  Result Value Ref Range   Color, Urine COLORLESS (A) YELLOW   APPearance CLEAR (A) CLEAR   Specific Gravity, Urine 1.012 1.005 - 1.030   pH 6.0 5.0 - 8.0   Glucose, UA >=500 (A) NEGATIVE mg/dL   Hgb urine dipstick NEGATIVE NEGATIVE   Bilirubin Urine NEGATIVE NEGATIVE   Ketones, ur NEGATIVE NEGATIVE mg/dL   Protein, ur NEGATIVE NEGATIVE mg/dL   Nitrite NEGATIVE NEGATIVE   Leukocytes,Ua NEGATIVE NEGATIVE   WBC, UA NONE SEEN 0 - 5 WBC/hpf   Bacteria, UA NONE SEEN NONE SEEN   Squamous Epithelial / LPF NONE SEEN 0 - 5    Comment: Performed at [facility], [address] [place], [place] [address]  Lipase, blood     Status: None   Collection Time: 04/07/22 12:37 AM  Result Value Ref Range   Lipase 44 11 - 51 U/L    Comment: Performed at [facility], [address] [place], [place] [address]  Comprehensive metabolic panel     Status: Abnormal   Collection Time: 04/07/22 12:37 AM  Result Value Ref Range   Sodium 127 (L) 135 - 145  mmol/L   Potassium 4.2 3.5 - 5.1 mmol/L   Chloride 97 (L) 98 - 111 mmol/L   CO2 20 (L) 22 - 32 mmol/L   Glucose, Bld 494 (H) 70 - 99 mg/dL    Comment: Glucose reference range applies only to samples taken after fasting for at least 8 hours.   BUN 31 (H) 8 - 23 mg/dL   Creatinine, Ser 1.89 (H) 0.61 - 1.24 mg/dL   Calcium 9.2 8.9 - 10.3 mg/dL   Total Protein 7.4 6.5 - 8.1 g/dL   Albumin 4.1 3.5 - 5.0 g/dL   AST 34 15 - 41 U/L   ALT 36 0 - 44 U/L   Alkaline Phosphatase 316 (H) 38 - 126 U/L   Total Bilirubin 0.9 0.3 - 1.2 mg/dL   GFR, Estimated 38 (L) >60 mL/min    Comment: (NOTE) Calculated using the CKD-EPI Creatinine Equation (2021)    Anion  gap 10 5 - 15    Comment: Performed at [facility], [address] [place], [place] [address]  CBC     Status: Abnormal   Collection Time: 04/07/22 12:37 AM  Result Value Ref Range   WBC 7.4 4.0 - 10.5 K/uL   RBC 4.58 4.22 - 5.81 MIL/uL   Hemoglobin 12.9 (L) 13.0 - 17.0 g/dL   HCT 36.2 (L) 39.0 - 52.0 %   MCV 79.0 (L) 80.0 - 100.0 fL   MCH 28.2 26.0 - 34.0 pg   MCHC 35.6 30.0 - 36.0 g/dL   RDW 12.6 11.5 - 15.5 %   Platelets 188 150 - 400 K/uL   nRBC 0.0 0.0 - 0.2 %    Comment: Performed at [facility], [address] [place], [place] [address]  HIV Antibody (routine testing w rflx)     Status: None   Collection Time: 04/07/22  6:52 AM  Result Value Ref Range   HIV Screen 4th Generation wRfx Non Reactive Non Reactive    Comment: Performed at [facility] [address], [place], [place] [address]  Hemoglobin A1c     Status: Abnormal   Collection Time: 04/07/22  6:52 AM  Result Value Ref Range   Hgb A1c MFr Bld 13.4 (H) 4.8 - 5.6 %    Comment: (NOTE) Pre diabetes:          5.7%-6.4%  Diabetes:              >6.4%  Glycemic control for   <7.0% adults with diabetes    Mean Plasma Glucose 337.88 mg/dL    Comment: Performed at [facility] [address], [place], [place] [address]  CBG monitoring, ED     Status: Abnormal   Collection Time: 04/07/22  7:25 AM  Result Value Ref Range   Glucose-Capillary 320 (H) 70 - 99 mg/dL    Comment: Glucose reference range applies only to samples taken after fasting for at least 8 hours.  CBG monitoring, ED     Status: Abnormal   Collection Time: 04/07/22  9:00 AM  Result Value Ref Range   Glucose-Capillary 273 (H) 70 - 99 mg/dL    Comment: Glucose reference range applies only to samples taken after fasting for at least 8 hours.   Comment 1 Notify RN    Comment 2 Document in Chart   Osmolality     Status: Abnormal   Collection Time: 04/07/22  9:24 AM  Result Value Ref Range   Osmolality 301 (H) 275 -  295 mOsm/kg    Comment: RESULT REPEATED AND VERIFIED Performed at [facility], [address] [place], [place] [address]   Osmolality, urine     Status: Abnormal   Collection Time: 04/07/22  9:24 AM  Result Value Ref Range   Osmolality, Ur 248 (L) 300 - 900 mOsm/kg    Comment: RESULT REPEATED AND VERIFIED Performed at [facility], [address] [place], [place] [address]   Sodium, urine, random     Status: None   Collection Time: 04/07/22  9:24 AM  Result Value Ref Range   Sodium, Ur <10 mmol/L    Comment: Performed at [facility], [address] [place], [place] [address]  Basic metabolic panel     Status: Abnormal   Collection Time: 04/07/22  9:24 AM  Result Value Ref Range   Sodium 133 (L) 135 - 145 mmol/L   Potassium 3.6 3.5 - 5.1 mmol/L   Chloride 102 98 - 111 mmol/L   CO2 23 22 - 32 mmol/L   Glucose, Bld 257 (H) 70 - 99 mg/dL    Comment: Glucose reference range applies only to samples taken after fasting for at least 8 hours.   BUN 31 (H) 8 - 23 mg/dL   Creatinine, Ser 1.83 (H) 0.61 - 1.24 mg/dL   Calcium 9.3 8.9 - 10.3 mg/dL   GFR, Estimated 40 (L) >60 mL/min    Comment: (NOTE) Calculated using the CKD-EPI Creatinine Equation (2021)    Anion gap 8 5 - 15    Comment: Performed at [facility], [address], [place], [place] [address]  C Difficile Quick Screen w PCR reflex     Status: None   Collection Time: 04/07/22  9:24 AM   Specimen: STOOL  Result Value Ref Range   C Diff antigen NEGATIVE NEGATIVE   C Diff toxin NEGATIVE NEGATIVE   C Diff interpretation No C. difficile detected.     Comment: Performed at [facility], [address] [place], [place] [address]  Gastrointestinal Panel by PCR , Stool     Status: None   Collection Time: 04/07/22  9:24 AM   Specimen: Stool  Result Value Ref Range   Campylobacter species NOT DETECTED NOT DETECTED   Plesimonas shigelloides NOT DETECTED NOT DETECTED    Salmonella species NOT DETECTED NOT DETECTED   Yersinia enterocolitica NOT DETECTED NOT DETECTED   Vibrio species NOT DETECTED NOT DETECTED   Vibrio cholerae NOT DETECTED NOT DETECTED   Enteroaggregative E coli (EAEC) NOT DETECTED NOT DETECTED   Enteropathogenic E coli (EPEC) NOT DETECTED NOT DETECTED   Enterotoxigenic E coli (ETEC) NOT DETECTED NOT DETECTED   Shiga like toxin producing E coli (STEC) NOT DETECTED NOT DETECTED   Shigella/Enteroinvasive E coli (EIEC) NOT DETECTED NOT DETECTED   Cryptosporidium NOT DETECTED NOT DETECTED   Cyclospora cayetanensis NOT DETECTED NOT DETECTED   Entamoeba histolytica NOT DETECTED NOT DETECTED   Giardia lamblia NOT DETECTED NOT DETECTED   Adenovirus F40/41 NOT DETECTED NOT DETECTED   Astrovirus NOT DETECTED NOT DETECTED   Norovirus GI/GII NOT DETECTED NOT DETECTED   Rotavirus A NOT DETECTED NOT DETECTED   Sapovirus (I, II, IV, and V) NOT DETECTED NOT DETECTED    Comment: Performed at [facility], [address] [place], [place] [address]  CBG monitoring, ED     Status: Abnormal   Collection Time: 04/07/22 11:32 AM  Result Value Ref Range   Glucose-Capillary 120 (H) 70 -  99 mg/dL    Comment: Glucose reference range applies only to samples taken after fasting for at least 8 hours.   Comment 1 Notify RN    Comment 2 Document in Chart    No results found.  Pending Labs Unresulted Labs (From admission, onward)     Start     Ordered   04/07/22 3491  Basic metabolic panel  Now then every 8 hours,   R      04/07/22 0854            Vitals/Pain Today's Vitals   04/07/22 0630 04/07/22 0831 04/07/22 1000 04/07/22 1346  BP: 113/72 127/84 129/86 116/84  Pulse: 97 98 97 91  Resp: 13 18 (!) 21 18  Temp:    98.5 F (36.9 C)  TempSrc:    Oral  SpO2: 98% 98% 96% 100%  Weight:      Height:      PainSc:        Isolation Precautions No active isolations  Medications Medications  OLANZapine (ZYPREXA) tablet 7.5 mg (has no  administration in time range)  venlafaxine XR (EFFEXOR-XR) 24 hr capsule 150 mg (150 mg Oral Given 04/07/22 0921)  insulin glargine-yfgn (SEMGLEE) injection 35 Units (35 Units Subcutaneous Given 04/07/22 0926)  pantoprazole (PROTONIX) EC tablet 40 mg (40 mg Oral Given 04/07/22 0907)  mupirocin ointment (BACTROBAN) 2 % 1 Application (1 Application Topical Given 04/07/22 0922)  enoxaparin (LOVENOX) injection 40 mg (40 mg Subcutaneous Given 04/07/22 0726)  acetaminophen (TYLENOL) tablet 650 mg (has no administration in time range)    Or  acetaminophen (TYLENOL) suppository 650 mg (has no administration in time range)  traZODone (DESYREL) tablet 25 mg (has no administration in time range)  magnesium hydroxide (MILK OF MAGNESIA) suspension 30 mL (has no administration in time range)  ondansetron (ZOFRAN) tablet 4 mg (has no administration in time range)    Or  ondansetron (ZOFRAN) injection 4 mg (has no administration in time range)  insulin aspart (novoLOG) injection 0-5 Units (has no administration in time range)  insulin aspart (novoLOG) injection 0-9 Units ( Subcutaneous Not Given 04/07/22 1137)  hydrALAZINE (APRESOLINE) injection 5 mg (has no administration in time range)  fentaNYL (SUBLIMAZE) injection 12.5 mcg (has no administration in time range)  LORazepam (ATIVAN) tablet 0.5 mg (has no administration in time range)  loperamide (IMODIUM) capsule 2 mg (2 mg Oral Given 04/07/22 1343)  sodium chloride 0.9 % bolus 1,000 mL (0 mLs Intravenous Stopped 04/07/22 0512)  insulin aspart (novoLOG) injection 5 Units (5 Units Intravenous Given 04/07/22 0345)  ALPRAZolam (XANAX XR) 24 hr tablet 0.5 mg (0.5 mg Oral Given 04/07/22 1156)    Mobility walks Low fall risk   Focused Assessments Cardiac Assessment Handoff:    Lab Results  Component Value Date   CKTOTAL 69 05/28/2013   CKMB 1.7 05/28/2013   TROPONINI <0.03 05/27/2017   No results found for: "DDIMER" Does the Patient currently have chest  pain? No    R Recommendations: See Admitting Provider Note  Report given to:   Additional Notes:

## 2022-04-07 NOTE — Assessment & Plan Note (Signed)
Blood pressure 127/84.  Patient is not taking medications currently -IV hydralazine as needed

## 2022-04-07 NOTE — ED Notes (Signed)
Pt unable to sit still in room and requesting to walk around to help calm him.

## 2022-04-07 NOTE — Assessment & Plan Note (Signed)
-  see abvoe

## 2022-04-07 NOTE — Assessment & Plan Note (Signed)
-  s/p of cholectomy and J-pouch

## 2022-04-07 NOTE — Assessment & Plan Note (Addendum)
Etiology is not clear.  Currently patient does not have abdominal pain.  No fever or leukocytosis.  C diff test negative.  GI pathogen panel negative. -Supportive care -As needed Zofran -Imodium 2 mg twice daily -IV fluid as above

## 2022-04-07 NOTE — ED Notes (Signed)
Provider at bedside

## 2022-04-07 NOTE — ED Provider Notes (Signed)
[facility] Provider Note    Event Date/Time   First MD Initiated Contact with Patient 04/07/22 0227     (approximate)   History   Chief Complaint Abdominal Pain   HPI  [name] is a 66 y.o. male with past medical history of hypertension, diabetes, lymphoma, and ulcerative colitis status post colectomy who presents to the ED complaining of diarrhea and abdominal pain.  Patient reports that he has been dealing with diarrhea for multiple weeks although it has seemed to get worse over the past couple of days.  He describes intermittent crampy pain in his lower abdomen as well as occasional nausea, but has not had any fevers or vomiting.  He has not had any fevers, dysuria, hematuria, or flank pain.  He does state that he was feeling increasingly lightheaded today so he decided to seek care.  He denies any chest pain or shortness of breath.     Physical Exam   Triage Vital Signs: ED Triage Vitals  Enc Vitals Group     BP 04/07/22 0026 121/75     Pulse Rate 04/07/22 0026 (!) 109     Resp 04/07/22 0026 16     Temp 04/07/22 0026 98.3 F (36.8 C)     Temp Source 04/07/22 0026 Oral     SpO2 04/07/22 0026 99 %     Weight 04/07/22 0027 180 lb (81.6 kg)     Height 04/07/22 0027 5' 10"  (1.778 m)     Head Circumference --      Peak Flow --      Pain Score 04/07/22 0027 7     Pain Loc --      Pain Edu? --      Excl. in [place]? --     Most recent vital signs: Vitals:   04/07/22 0026  BP: 121/75  Pulse: (!) 109  Resp: 16  Temp: 98.3 F (36.8 C)  SpO2: 99%    Constitutional: Alert and oriented. Eyes: Conjunctivae are normal. Head: Atraumatic. Nose: No congestion/rhinnorhea. Mouth/Throat: Mucous membranes are dry.  Cardiovascular: Normal rate, regular rhythm. Grossly normal heart sounds.  2+ radial pulses bilaterally. Respiratory: Normal respiratory effort.  No retractions. Lungs CTAB. Gastrointestinal: Soft and nontender. No  distention. Musculoskeletal: No lower extremity tenderness nor edema.  Neurologic:  Normal speech and language. No gross focal neurologic deficits are appreciated.    ED Results / Procedures / Treatments   Labs (all labs ordered are listed, but only abnormal results are displayed) Labs Reviewed  COMPREHENSIVE METABOLIC PANEL - Abnormal; Notable for the following components:      Result Value   Sodium 127 (*)    Chloride 97 (*)    CO2 20 (*)    Glucose, Bld 494 (*)    BUN 31 (*)    Creatinine, Ser 1.89 (*)    Alkaline Phosphatase 316 (*)    GFR, Estimated 38 (*)    All other components within normal limits  CBC - Abnormal; Notable for the following components:   Hemoglobin 12.9 (*)    HCT 36.2 (*)    MCV 79.0 (*)    All other components within normal limits  URINALYSIS, ROUTINE W REFLEX MICROSCOPIC - Abnormal; Notable for the following components:   Color, Urine COLORLESS (*)    APPearance CLEAR (*)    Glucose, UA >=500 (*)    All other components within normal limits  LIPASE, BLOOD    PROCEDURES:  Critical Care performed: No  Procedures   MEDICATIONS ORDERED IN ED: Medications  sodium chloride 0.9 % bolus 1,000 mL (has no administration in time range)  insulin aspart (novoLOG) injection 5 Units (has no administration in time range)     IMPRESSION / MDM / ASSESSMENT AND PLAN / ED COURSE  I reviewed the triage vital signs and the nursing notes.                              66 y.o. male with past medical history of hypertension, diabetes, ulcerative colitis, and lymphoma who presents to the ED complaining of about 2 weeks of diarrhea worse over the past 3 days now associated with some lightheadedness and intermittent crampy abdominal pain.  Patient's presentation is most consistent with acute presentation with potential threat to life or bodily function.  Differential diagnosis includes, but is not limited to, gastroenteritis, GI bleed, diverticulitis,  appendicitis, UTI, dehydration, electrolyte abnormality, AKI.  Patient nontoxic-appearing and in no acute distress, vital signs are unremarkable and he has a benign abdominal exam.  He appears to be experiencing acute on chronic diarrhea, may have element of gastroenteritis but no evidence of GI bleed at this time.  Labs show no significant anemia or leukocytosis, however he is significantly hyperglycemic with AKI.  Anion gap within normal limits and I doubt DKA, patient does have low sodium level that normalizes when corrected for hyperglycemia.  We will hydrate with IV fluids and give dose of IV insulin.  Renal function worsening from patient's ED visit about 3 weeks ago and also worse from his baseline last year.  Given benign abdominal exam, do not feel CT is indicated at this time, but patient would benefit from admission for AKI as well as improved control of his diabetes.  Case discussed with hospitalist for admission.      FINAL CLINICAL IMPRESSION(S) / ED DIAGNOSES   Final diagnoses:  AKI (acute kidney injury) ([place])  Hyperglycemia  Diarrhea, unspecified type     Rx / DC Orders   ED Discharge Orders     None        Note:  This document was prepared using Dragon voice recognition software and may include unintentional dictation errors.   [name], MD 04/07/22 [phone]

## 2022-04-08 ENCOUNTER — Inpatient Hospital Stay: Payer: Medicare Other

## 2022-04-08 LAB — BASIC METABOLIC PANEL
Anion gap: 3 — ABNORMAL LOW (ref 5–15)
Anion gap: 8 (ref 5–15)
BUN: 29 mg/dL — ABNORMAL HIGH (ref 8–23)
BUN: 26 mg/dL — ABNORMAL HIGH (ref 8–23)
CO2: 26 mmol/L (ref 22–32)
CO2: 23 mmol/L (ref 22–32)
Calcium: 8.6 mg/dL — ABNORMAL LOW (ref 8.9–10.3)
Calcium: 8.5 mg/dL — ABNORMAL LOW (ref 8.9–10.3)
Chloride: 107 mmol/L (ref 98–111)
Chloride: 101 mmol/L (ref 98–111)
Creatinine, Ser: 1.79 mg/dL — ABNORMAL HIGH (ref 0.61–1.24)
Creatinine, Ser: 1.57 mg/dL — ABNORMAL HIGH (ref 0.61–1.24)
GFR, Estimated: 41 mL/min — ABNORMAL LOW (ref >60)
GFR, Estimated: 48 mL/min — ABNORMAL LOW (ref >60)
Glucose, Bld: 278 mg/dL — ABNORMAL HIGH (ref 70–99)
Glucose, Bld: 311 mg/dL — ABNORMAL HIGH (ref 70–99)
Potassium: 4.6 mmol/L (ref 3.5–5.1)
Potassium: 3.7 mmol/L (ref 3.5–5.1)
Sodium: 136 mmol/L (ref 135–145)
Sodium: 132 mmol/L — ABNORMAL LOW (ref 135–145)

## 2022-04-08 LAB — GLUCOSE, CAPILLARY
Glucose-Capillary: 235 mg/dL — ABNORMAL HIGH (ref 70–99)
Glucose-Capillary: 325 mg/dL — ABNORMAL HIGH (ref 70–99)
Glucose-Capillary: 140 mg/dL — ABNORMAL HIGH (ref 70–99)
Glucose-Capillary: 157 mg/dL — ABNORMAL HIGH (ref 70–99)

## 2022-04-08 MED ORDER — INSULIN ASPART 100 UNIT/ML IJ SOLN
6.0000 [IU] | Freq: Three times a day (TID) | INTRAMUSCULAR | Status: DC
Start: 2022-04-08 — End: 2022-04-09
  Administered 2022-04-08 – 2022-04-09 (×3): 6 [IU] via SUBCUTANEOUS
  Filled 2022-04-08 (×3): qty 1

## 2022-04-08 NOTE — Progress Notes (Signed)
Progress Note   Patient: [name] OEV:[id number] DOB: 01/25/1956 DOA: 04/07/2022     1 DOS: the patient was seen and examined on 04/08/2022   Brief hospital course: 66 y.o. 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 7/16- still w/ diarrhea, cultures pending  Assessment and Plan: #Acute renal failure superimposed on stage 3a chronic kidney disease ([place]) Baseline creatinine 1.2-11.3.  creatinine is 1.89, BUN 31, GFR 38.  Likely due to dehydration secondary to diarrhea. - Cr today- 1.79, stable US renal pending, bladder scan  Hyponatremia Sodium admission- 127, likely due to GI loss. Sodium today 132, corrected appropriately with IVF, goal <8 meq /24. Q8 Sodiums given confusion Urine sodium low, low osm, ongoing GI losses established - Fluid restriction - f/u by BMP q8h - avoid over correction too fast due to risk of central pontine myelinolysis   Diarrhea Etiology is not clear.  Currently patient does not have abdominal pain.  No fever or leukocytosis.  C diff test negative.  GI pathogen panel negative. -Supportive care -As needed Zofran -Imodium 2 mg twice daily -IV fluid as above   UC (ulcerative colitis) ([place]) -s/p of cholectomy and J-pouch  Type II diabetes mellitus with renal manifestations ([facility]) Recent A1c 9.8.  Blood sugar 494 --> 320.  Anion gap normal.  No DKA.  Patient is taking metformin, Jardiance, glargine insulin 35 units daily -Glargine insulin 35 units daily -sliding scale insulin  Adjustment disorder with anxiety Adjustment disorder with anxiety, panic disorder -Ativan 0.5 mg 3 times daily -Continue home olanzapine and Effexor -Give 1 dose of Xanax 0.5 mg  Panic attack -see  abvoe  Essential hypertension Blood pressure 127/84.  Patient is not taking medications currently -IV hydralazine as needed     Subjective: no reported events o/n. Progressing as expected  Physical Exam: Vitals:   04/07/22 1616 04/07/22 2102 04/08/22 0432 04/08/22 0752  BP: 122/82 126/78 137/81 124/73  Pulse: 86 76 88 85  Resp: 18 16 16 18   Temp: 98.1 F (36.7 C) 97.9 F (36.6 C) 97.7 F (36.5 C) 97.9 F (36.6 C)  TempSrc: Oral Oral Oral   SpO2: 100% 100% 100% 100%  Weight:      Height:      General: Not in acute distress.  Dry mucous membrane.  Patient has anxiety. HEENT:       Eyes: PERRL, EOMI, no scleral icterus.       ENT: No discharge from the ears and nose, no pharynx injection, no tonsillar enlargement.        Neck: No JVD, no bruit, no mass felt. Heme: No neck lymph node enlargement. Cardiac: S1/S2, RRR, No murmurs, No gallops or rubs. Respiratory: No rales, wheezing, rhonchi or rubs. GI: Soft, nondistended, nontender, no rebound pain, no organomegaly, BS present. GU: No hematuria Ext: No pitting leg edema bilaterally. 1+DP/PT pulse bilaterally. Musculoskeletal: No joint deformities, No joint redness or warmth, no limitation of ROM in spin. Skin: No rashes.  Neuro: Alert, oriented X3, cranial nerves II-XII grossly intact, moves all extremities normally.  Psych: Patient is not psychotic, no suicidal or hemocidal ideation.     Data Reviewed:  Results are pending, will review when available.  Family Communication: none available  Disposition: Status is: Inpatient Remains inpatient appropriate because: dehydration, hyponatremia, confusion  Planned  Discharge Destination: Home    Time spent: 30 minutes  Author: Vanna Scotland, MD 04/08/2022 8:33 AM  For on call review [email or web address].

## 2022-04-09 ENCOUNTER — Inpatient Hospital Stay: Payer: Medicare Other

## 2022-04-09 LAB — BASIC METABOLIC PANEL
Anion gap: 5 (ref 5–15)
BUN: 21 mg/dL (ref 8–23)
CO2: 23 mmol/L (ref 22–32)
Calcium: 8.9 mg/dL (ref 8.9–10.3)
Chloride: 107 mmol/L (ref 98–111)
Creatinine, Ser: 1.47 mg/dL — ABNORMAL HIGH (ref 0.61–1.24)
GFR, Estimated: 52 mL/min — ABNORMAL LOW (ref >60)
Glucose, Bld: 251 mg/dL — ABNORMAL HIGH (ref 70–99)
Potassium: 4.1 mmol/L (ref 3.5–5.1)
Sodium: 135 mmol/L (ref 135–145)

## 2022-04-09 LAB — GLUCOSE, CAPILLARY
Glucose-Capillary: 255 mg/dL — ABNORMAL HIGH (ref 70–99)
Glucose-Capillary: 335 mg/dL — ABNORMAL HIGH (ref 70–99)
Glucose-Capillary: 123 mg/dL — ABNORMAL HIGH (ref 70–99)
Glucose-Capillary: 243 mg/dL — ABNORMAL HIGH (ref 70–99)

## 2022-04-09 MED ORDER — INSULIN GLARGINE-YFGN 100 UNIT/ML ~~LOC~~ SOLN
40.0000 [IU] | Freq: Every day | SUBCUTANEOUS | Status: DC
  Administered 2022-04-10 – 2022-04-11 (×2): 40 [IU] via SUBCUTANEOUS
  Filled 2022-04-09 (×2): qty 0.4

## 2022-04-09 MED ORDER — INSULIN ASPART 100 UNIT/ML IJ SOLN
8.0000 [IU] | Freq: Three times a day (TID) | INTRAMUSCULAR | Status: DC
  Administered 2022-04-09 – 2022-04-11 (×4): 8 [IU] via SUBCUTANEOUS
  Filled 2022-04-09 (×4): qty 1

## 2022-04-09 MED ORDER — SODIUM CHLORIDE 0.9 % IV BOLUS
1000.0000 mL | Freq: Once | INTRAVENOUS | Status: AC
Start: 2022-04-09 — End: 2022-04-09
  Administered 2022-04-09: 1000 mL via INTRAVENOUS

## 2022-04-09 MED ORDER — FLEET ENEMA 7-19 GM/118ML RE ENEM
1.0000 | ENEMA | Freq: Once | RECTAL | Status: AC
  Administered 2022-04-10: 1 via RECTAL

## 2022-04-09 MED ORDER — DIPHENOXYLATE-ATROPINE 2.5-0.025 MG PO TABS
2.0000 | ORAL_TABLET | Freq: Four times a day (QID) | ORAL | Status: AC
  Administered 2022-04-09 (×4): 2 via ORAL
  Filled 2022-04-09 (×4): qty 2

## 2022-04-09 MED ORDER — INSULIN GLARGINE-YFGN 100 UNIT/ML ~~LOC~~ SOLN
5.0000 [IU] | Freq: Once | SUBCUTANEOUS | Status: AC
  Administered 2022-04-09: 5 [IU] via SUBCUTANEOUS
  Filled 2022-04-09: qty 0.05

## 2022-04-09 NOTE — Consult Note (Signed)
[facility]  CONSULT NOTE    Date: 04/09/2022                  Patient Name:  [name]  MRN: [id number]  DOB: 11-Feb-1956  Age / Sex: 66 y.o., male         PCP: Alene Mires Elyse Jarvis, MD                 Service Requesting Consult: [facility]                 Reason for Consult: Acute kidney injury with hyponatremia            History of Present Illness: Mr. [name] is a 66 y.o.  male with past medical history including PTSD, diabetes, UC, diffuse large B cell lymphoma, GERD, hypertension, and chronic kidney disease stage II, who was admitted to [facility] on 04/07/2022 for Hyperglycemia [R73.9] AKI (acute kidney injury) ([place]) [N17.9] Diarrhea, unspecified type [R19.7]  Patient presents to the emergency department with a chief complaint of diarrhea.  Patient states he has had this for the past few days on and off.  States he has worsened in the past 24 hours.  Chart review reveals patient states at least 7 occurrences in 24 hours.  Denies melena.  Denies shortness of breath or cough however states he does feel lightheaded.  No known fever or chills.  Denies NSAID use.  Labs on admission include sodium 127, glucose 494, serum bicarb 20, BUN 31, creatinine 1.89 with GFR 38, hemoglobin 12.9.  Urinalysis negative for UTI.  Serum osmolality 301 with urine osmolality 248.  C. difficile panel negative.  Complete GI stool panel negative.   Medications: Outpatient medications: Medications Prior to Admission  Medication Sig Dispense Refill Last Dose   JARDIANCE 10 MG TABS tablet Take 10 mg by mouth daily.   04/06/2022   LANTUS SOLOSTAR 100 UNIT/ML Solostar Pen Inject 35 Units into the skin at bedtime.   04/06/2022   LORazepam (ATIVAN) 0.5 MG tablet Take 1 tablet (0.5 mg total) by mouth 2 (two) times daily. 60 tablet 0 04/06/2022   metFORMIN (GLUCOPHAGE) 1000 MG tablet Take 1,000 mg by mouth 2 (two) times daily.  2 04/06/2022   mupirocin ointment (BACTROBAN) 2 % Apply 1  Application topically daily.   04/06/2022   OLANZapine (ZYPREXA) 5 MG tablet Take 1.5 tablets (7.5 mg total) by mouth at bedtime. 45 tablet 0 04/06/2022   omeprazole (PRILOSEC) 40 MG capsule Take 40 mg by mouth daily.   4 04/06/2022   venlafaxine XR (EFFEXOR-XR) 150 MG 24 hr capsule Take 150 mg by mouth daily.  1 04/06/2022    Current medications: Current Facility-Administered Medications  Medication Dose Route Frequency Provider Last Rate Last Admin   acetaminophen (TYLENOL) tablet 650 mg  650 mg Oral Q6H PRN Mansy, Jan A, MD   650 mg at 04/08/22 9326   Or   acetaminophen (TYLENOL) suppository 650 mg  650 mg Rectal Q6H PRN Mansy, Jan A, MD       diphenoxylate-atropine (LOMOTIL) 2.5-0.025 MG per tablet 2 tablet  2 tablet Oral QID Vanna Scotland, MD   2 tablet at 04/09/22 0956   enoxaparin (LOVENOX) injection 40 mg  40 mg Subcutaneous Q24H Mansy, Jan A, MD   40 mg at 04/09/22 0849   fentaNYL (SUBLIMAZE) injection 12.5 mcg  12.5 mcg Intravenous Q2H PRN Ivor Costa, MD       hydrALAZINE (APRESOLINE) injection 5 mg  5  mg Intravenous Q2H PRN Ivor Costa, MD       insulin aspart (novoLOG) injection 0-5 Units  0-5 Units Subcutaneous QHS Ivor Costa, MD   2 Units at 04/07/22 2107   insulin aspart (novoLOG) injection 0-9 Units  0-9 Units Subcutaneous TID WC Benita Gutter, RPH   9 Units at 04/09/22 1218   insulin aspart (novoLOG) injection 8 Units  8 Units Subcutaneous TID WC Vanna Scotland, MD   8 Units at 04/09/22 1218   [START ON 04/10/2022] insulin glargine-yfgn (SEMGLEE) injection 40 Units  40 Units Subcutaneous Daily Vanna Scotland, MD       LORazepam (ATIVAN) tablet 0.5 mg  0.5 mg Oral TID Ivor Costa, MD   0.5 mg at 04/09/22 0847   mupirocin ointment (BACTROBAN) 2 % 1 Application  1 Application Topical Daily Mansy, Jan A, MD   1 Application at 09/40/76 0850   OLANZapine (ZYPREXA) tablet 7.5 mg  7.5 mg Oral QHS Mansy, Jan A, MD   7.5 mg at 04/08/22 2107   ondansetron (ZOFRAN) tablet 4 mg  4 mg Oral Q6H PRN  Mansy, Jan A, MD       Or   ondansetron [facility]) injection 4 mg  4 mg Intravenous Q6H PRN Mansy, Jan A, MD       pantoprazole (PROTONIX) EC tablet 40 mg  40 mg Oral Daily Mansy, Jan A, MD   40 mg at 04/09/22 0847   [START ON 04/10/2022] sodium phosphate (FLEET) 7-19 GM/118ML enema 1 enema  1 enema Rectal Once Annamaria Helling, DO       traZODone (DESYREL) tablet 25 mg  25 mg Oral QHS PRN Mansy, Jan A, MD       venlafaxine XR (EFFEXOR-XR) 24 hr capsule 150 mg  150 mg Oral Daily Mansy, Jan A, MD   150 mg at 04/09/22 0847      Allergies: Allergies  Allergen Reactions   Sulfa Antibiotics Rash and Hives   Oxycodone Other (See Comments) and Rash    Rash and headache      Past Medical History: Past Medical History:  Diagnosis Date   Cancer ([place])    Diabetes mellitus without complication ([place])      Past Surgical History: Past Surgical History:  Procedure Laterality Date   OTHER SURGICAL HISTORY     J pouch 1997, history of ileostomy with reversal     Family History: Family History  Problem Relation Age of Onset   Colon cancer Sister    Crohn's disease Brother      Social History: Social History   Socioeconomic History   Marital status: Married    Spouse name: Not on file   Number of children: Not on file   Years of education: Not on file   Highest education level: Not on file  Occupational History   Not on file  Tobacco Use   Smoking status: Never   Smokeless tobacco: Never  Vaping Use   Vaping Use: Never used  Substance and Sexual Activity   Alcohol use: Never   Drug use: Never   Sexual activity: Not on file  Other Topics Concern   Not on file  Social History Narrative   Not on file   Social Determinants of Health   Financial Resource Strain: Not on file  Food Insecurity: Not on file  Transportation Needs: Not on file  Physical Activity: Not on file  Stress: Not on file  Social Connections: Not on file  Intimate Partner Violence:  Not on file      Review of Systems: Review of Systems  Constitutional:  Negative for chills, fever and malaise/fatigue.  HENT:  Negative for congestion, sore throat and tinnitus.   Eyes:  Negative for blurred vision and redness.  Respiratory:  Negative for cough, shortness of breath and wheezing.   Cardiovascular:  Negative for chest pain, palpitations, claudication and leg swelling.  Gastrointestinal:  Positive for diarrhea. Negative for abdominal pain, blood in stool, nausea and vomiting.  Genitourinary:  Negative for flank pain, frequency and hematuria.  Musculoskeletal:  Negative for back pain, falls and myalgias.  Skin:  Negative for rash.  Neurological:  Positive for dizziness. Negative for weakness and headaches.  Endo/Heme/Allergies:  Does not bruise/bleed easily.  Psychiatric/Behavioral:  Negative for depression. The patient is not nervous/anxious and does not have insomnia.     Vital Signs: Blood pressure 115/82, pulse 79, temperature 98 F (36.7 C), resp. rate 18, height 5' 10"  (1.778 m), weight 81.6 kg, SpO2 100 %.  Weight trends: Filed Weights   04/07/22 0027  Weight: 81.6 kg    Physical Exam: General: NAD, resting comfortably  Head: Normocephalic, atraumatic. Moist oral mucosal membranes  Eyes: Anicteric  Lungs:  Clear to auscultation, normal effort, room air  Heart: Regular rate and rhythm  Abdomen:  Soft, nontender, nondistended  Extremities: No peripheral edema.  Neurologic: Nonfocal, moving all four extremities  Skin: No lesions  Access: None     Lab results: Basic Metabolic Panel: Recent Labs  Lab 04/08/22 0045 04/08/22 0852 04/09/22 0803  NA 136 132* 135  K 4.6 3.7 4.1  CL 107 101 107  CO2 26 23 23   GLUCOSE 278* 311* 251*  BUN 29* 26* 21  CREATININE 1.79* 1.57* 1.47*  CALCIUM 8.6* 8.5* 8.9    Liver Function Tests: Recent Labs  Lab 04/07/22 0037  AST 34  ALT 36  ALKPHOS 316*  BILITOT 0.9  PROT 7.4  ALBUMIN 4.1   Recent Labs  Lab  04/07/22 0037  LIPASE 44   No results for input(s): "AMMONIA" in the last 168 hours.  CBC: Recent Labs  Lab 04/07/22 0037  WBC 7.4  HGB 12.9*  HCT 36.2*  MCV 79.0*  PLT 188    Cardiac Enzymes: No results for input(s): "CKTOTAL", "CKMB", "CKMBINDEX", "TROPONINI" in the last 168 hours.  BNP: Invalid input(s): "POCBNP"  CBG: Recent Labs  Lab 04/08/22 1202 04/08/22 1720 04/08/22 2156 04/09/22 0819 04/09/22 1127  GLUCAP 325* 140* 157* 255* 335*    Microbiology: Results for orders placed or performed during the hospital encounter of 04/07/22  C Difficile Quick Screen w PCR reflex     Status: None   Collection Time: 04/07/22  9:24 AM   Specimen: STOOL  Result Value Ref Range Status   C Diff antigen NEGATIVE NEGATIVE Final   C Diff toxin NEGATIVE NEGATIVE Final   C Diff interpretation No C. difficile detected.  Final    Comment: Performed at [facility], [address] [place], [place] [address]  Gastrointestinal Panel by PCR , Stool     Status: None   Collection Time: 04/07/22  9:24 AM   Specimen: Stool  Result Value Ref Range Status   Campylobacter species NOT DETECTED NOT DETECTED Final   Plesimonas shigelloides NOT DETECTED NOT DETECTED Final   Salmonella species NOT DETECTED NOT DETECTED Final   Yersinia enterocolitica NOT DETECTED NOT DETECTED Final   Vibrio species NOT DETECTED NOT DETECTED Final   Vibrio cholerae  NOT DETECTED NOT DETECTED Final   Enteroaggregative E coli (EAEC) NOT DETECTED NOT DETECTED Final   Enteropathogenic E coli (EPEC) NOT DETECTED NOT DETECTED Final   Enterotoxigenic E coli (ETEC) NOT DETECTED NOT DETECTED Final   Shiga like toxin producing E coli (STEC) NOT DETECTED NOT DETECTED Final   Shigella/Enteroinvasive E coli (EIEC) NOT DETECTED NOT DETECTED Final   Cryptosporidium NOT DETECTED NOT DETECTED Final   Cyclospora cayetanensis NOT DETECTED NOT DETECTED Final   Entamoeba histolytica NOT DETECTED NOT DETECTED Final    Giardia lamblia NOT DETECTED NOT DETECTED Final   Adenovirus F40/41 NOT DETECTED NOT DETECTED Final   Astrovirus NOT DETECTED NOT DETECTED Final   Norovirus GI/GII NOT DETECTED NOT DETECTED Final   Rotavirus A NOT DETECTED NOT DETECTED Final   Sapovirus (I, II, IV, and V) NOT DETECTED NOT DETECTED Final    Comment: Performed at [facility], [address] [place], [place] [address]    Coagulation Studies: No results for input(s): "LABPROT", "INR" in the last 72 hours.  Urinalysis: Recent Labs    04/07/22 0036  COLORURINE COLORLESS*  LABSPEC 1.012  PHURINE 6.0  GLUCOSEU >=500*  HGBUR NEGATIVE  BILIRUBINUR NEGATIVE  KETONESUR NEGATIVE  PROTEINUR NEGATIVE  NITRITE NEGATIVE  LEUKOCYTESUR NEGATIVE      Imaging: US RENAL  Result Date: 04/08/2022 CLINICAL DATA:  Acute renal insufficiency EXAM: RENAL / URINARY TRACT ULTRASOUND COMPLETE COMPARISON:  CT scan of the abdomen and pelvis December 14, 2021 FINDINGS: Right Kidney: Renal measurements: 9.5 x 4.3 x 3.8 cm = volume: 82 mL. Increased cortical echogenicity. Left Kidney: Renal measurements: 10.1 x 5.9 x 5.3 cm = volume: 167 mL. Mild hydronephrosis. Increased cortical echogenicity. Bladder: Appears normal for degree of bladder distention. Other: None. IMPRESSION: 1. Mild left-sided hydronephrosis, new since December 14, 2021. CT imaging could better look for an underlying cause. 2. Medical renal disease with increased cortical echogenicity. Electronically Signed   By: Dorise Bullion III M.D.   On: 04/08/2022 12:05     Assessment & Plan: Mr. Abram Sax is a 66 y.o.  male with past medical history including PTSD, diabetes, UC, diffuse large B cell lymphoma, GERD, hypertension, and chronic kidney disease stage II,, who was admitted to [facility] on 04/07/2022 for Hyperglycemia [R73.9] AKI (acute kidney injury) ([place]) [N17.9] Diarrhea, unspecified type [R19.7]  Acute kidney injury with chronic kidney disease stage II. Baseline  creatinine 1.26 an GFR 63 on 01/04/22. Acute kidney injury likely secondary to hydronephrosis. No IV contrast exposure. Jardiance and metformin held. Renal ultrasound shows mild left hydronephrosis. No acute indication for dialysis at this time. Renal function elevated on admission. Responding well to IVF. Continue IVF at this time. Trace lower extremity edema. Will order CT abd/pelvis without contrast to evaluate obstruction. Will continue to monitor  2. Hyponatremia possibly due to acute kidney injury or Right upper lobe mass seen on CT imaging in Jan 2022. Patient has had no follow up for this finding. Sodium correcting with improved nutrition and IVF.     LOS: 2   7/17/20232:24 PM

## 2022-04-09 NOTE — Progress Notes (Signed)
Progress Note   Patient: [name] YQM:[id number] DOB: [birth year] DOA: 04/07/2022     2 DOS: the patient was seen and examined on 04/09/2022   Brief hospital course: 66 y.o. 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 7/16- still w/ diarrhea, cultures pending 7/17 increasing lomotil, awaiting repeat Cr (trending down to normal)    Assessment and Plan: #Acute renal failure superimposed on stage 3a chronic kidney disease (HCC) Baseline creatinine 1.2-1.3.  creatinine is 1.89, BUN 31, GFR 38.  Likely due to dehydration secondary to diarrhea. - Cr today- 1.79, stable US renal with mild renal hydronephrosis Given decreased kidney function at admission, hyponatremia, mild hydronephrosis will t/b Nephrology ensure we are not missing intrinsic renal disease vs diabetic nephropathy and pre-renal disease.  Hyponatremia, Complicated by confusion Resolving Sodium admission- 127, likely due to GI loss. Sodium improved, goal <8 meq /24. Urine sodium low, low osm, ongoing GI losses established  Diarrhea Etiology is not clear.  Currently patient does not have abdominal pain.  No fever or leukocytosis.  C diff test negative.  GI pathogen panel negative. -Supportive care -As needed Zofran -Imodium 2 mg QID, if not controlled will need GI evaluation  UC (ulcerative colitis) ([place]) -s/p of cholectomy and J-pouch  Type II diabetes mellitus with renal manifestations (HCC) Recent A1c 9.8.  Blood sugar 494 --> 320.  Anion gap normal.  No DKA.  Patient is taking metformin, Jardiance, glargine insulin 35 units daily -Glargine insulin 35 units daily -sliding scale insulin  Adjustment disorder with anxiety Adjustment disorder with  anxiety, panic disorder -Ativan 0.5 mg 3 times daily -Continue home olanzapine and Effexor -Give 1 dose of Xanax 0.5 mg  Panic attack -see abvoe  Essential hypertension Blood pressure 127/84.  Patient is not taking medications currently -IV hydralazine as needed     Subjective: energy improving, walking the halls. HDS. NAEON  Physical Exam: Vitals:   04/08/22 0752 04/08/22 1720 04/08/22 2159 04/09/22 0601  BP: 124/73 92/71 115/73 98/64  Pulse: 85 83 85 80  Resp: 18 18 16 16   Temp: 97.9 F (36.6 C) 98.1 F (36.7 C) 97.8 F (36.6 C) 97.8 F (36.6 C)  TempSrc:    Oral  SpO2: 100% 99% 100% 97%  Weight:      Height:      General: Not in acute distress.  +anxiety. HEENT:       Eyes: PERRL, EOMI, no scleral icterus.       ENT: No discharge from the ears and nose, no pharynx injection, no tonsillar enlargement.        Neck: No JVD, no bruit, no mass felt. Heme: No neck lymph node enlargement. Cardiac: S1/S2, RRR, No murmurs, No gallops or rubs. Respiratory: No rales, wheezing, rhonchi or rubs. GI: Soft, nondistended, nontender, no rebound pain, no organomegaly, BS present. GU: No hematuria Ext: No pitting leg edema bilaterally. 1+DP/PT pulse bilaterally. Musculoskeletal: No joint deformities, No joint redness or warmth, no limitation of ROM in spin. Skin: No rashes.  Neuro: Alert, oriented X3, cranial nerves II-XII grossly intact, moves all extremities normally.  Psych: Patient is not psychotic, no suicidal or hemocidal ideation.     Data Reviewed:  Results are pending, will review when available.  Family Communication: none available  Disposition: Status is: Inpatient Remains inpatient  appropriate because: dehydration, hyponatremia, confusion  Planned Discharge Destination: Home    Time spent: 30 minutes  Author: Vanna Scotland, MD 04/09/2022 8:14 AM  For on call review [email or web address].

## 2022-04-09 NOTE — Inpatient Diabetes Management (Addendum)
Inpatient Diabetes Program Recommendations  AACE/ADA: New Consensus Statement on Inpatient Glycemic Control (2015)  Target Ranges:  Prepandial:   less than 140 mg/dL      Peak postprandial:   less than 180 mg/dL (1-2 hours)      Critically ill patients:  140 - 180 mg/dL    Latest Reference Range & Units 04/07/22 06:52  Hemoglobin A1C 4.8 - 5.6 % 13.4 (H)  337 mg/dl    Latest Reference Range & Units 04/07/22 00:37  Glucose 70 - 99 mg/dL 494 (H) Admission Glucose  (H): Data is abnormally high  Latest Reference Range & Units 04/08/22 07:53 04/08/22 12:02 04/08/22 17:20 04/08/22 21:56  Glucose-Capillary 70 - 99 mg/dL 235 (H)  3 units Novolog  35 units Semglee _0   325 (H)  13 units Novolog  140 (H)  7 units Novolog  157 (H)  (H): Data is abnormally high  Latest Reference Range & Units 04/09/22 08:19  Glucose-Capillary 70 - 99 mg/dL 255 (H)  11 units Novolog  35 units Semglee  (H): Data is abnormally high  Admit with: Diarrhea  Acute renal failure superimposed on stage 3a chronic kidney disease  Hyponatremia  History: DM,  Ulcerative colitis (s/p of colectomy, with J-pouch), CKD  Home DM Meds: Jardiance 10 mg daily       Lantus 35 units QHS       Metformin 1000 mg BID  Current Orders: Semglee 35 units Daily     Novolog Sensitive Correction Scale/ SSI (0-9 units) TID AC + HS     Novolog 6 units TID with meals    MD- Note AM CBG today 255 mg/dl  Please consider:  Increase Semglee to 40 units Daily Since 35 unit dose already given this AM, please also order Semglee 5 units X 1 to be given this AM as well  Increase Novolog Meal Coverage to 8 units TID with meals   Addendum 11:15am--Met w/ pt at bedside this AM to discuss current A1c of 13.4%.  Pt told me he goes to the [facility] center and sees Dr. Aletta Edouard for medical care.  Told me He has a new appt with a new provider at this location (Dr. Rexene Edison) but unsure when appt is.  Gets  meds through the clinic and states they are usually affordable but does state he does have some affordability issues sometimes given he is on numerous meds.  Has CBG meter at home but only checking a few times per week.  Told me he has no problem taking his meds and exercising, however, he feels like his diet may cause issues with high CBGs (admits to liking "sweets" and eating lots of carbohydrate rich foods like bread)--Does not drink juice or regular soda.  Pt seemed mildly surprised to hear his A1c was so high this time.  Explained what an A1c is and what it measures.  Reminded patient that his goal A1c is 7% or less per ADA standards to prevent both acute and long-term complications.  Explained to patient the extreme importance of good glucose control at home.  Encouraged patient to check his CBGs at least daily at home (pre or post meals) and to record all CBGs in a logbook for his PCP to review.  Reviewed long-term home CBG goals.  Also discussed DM diet information with patient.  Encouraged patient to avoid beverages with sugar (regular soda, sweet tea, lemonade, fruit juice) and to consume mostly water.  Discussed what foods contain carbohydrates and how  carbohydrates affect the body's blood sugar levels.  Encouraged patient to be careful with his portion sizes (especially grains, starchy vegetables, and fruits).       --Will follow patient during hospitalization--  Wyn Quaker RN, MSN, [place] Diabetes Coordinator Inpatient Glycemic Control Team Team Pager: [phone] (8a-5p)

## 2022-04-09 NOTE — Consult Note (Signed)
Inpatient Consultation   Patient ID: [name] is a 67 y.o. male.  Requesting Provider: Vanna Scotland, MD  Date of Admission: 04/07/2022  Date of Consult: 04/09/22   Reason for Consultation: diarrhea   Patient's Chief Complaint:   Chief Complaint  Patient presents with   Abdominal Pain    66 year old Caucasian male with history of ulcerative colitis status post IPAA (2/2 high grade dysplasia) (1997/98 two-step surgery), pouchitis, PTSD, anxiety, uncontrolled DM2, Diffuse large B-cell lymphoma, TBI who presents to the hospital with diarrhea.  Patient is a vague historian, but per report he had worsening of his chronic diarrhea which prompted his presentation.  He notes 7 bowel movements.  Upon my evaluation the patient reports 16/day.  He reports taking loperamide 2 tablets 3 times a day.  He has not followed up with his gastroenterologist at [facility] since 2021 when he last underwent endoscopy.  He noted some lightheadedness on presentation was found to have an AKI with creatinine of 1.89 which is improved.  No melena or hematochezia.  No abdominal pain nausea or vomiting.  Weight and appetite have been stable.  He occasionally has abdominal cramping other relieved by ambulation and bowel movement.  He notes some urgency and reports nocturnal awakening which is normal for him.  Notably drinks multiple diet beverages and takes metformin as well as PPI.  Sugar has also been uncontrolled presenting with a glucose of 494.  A1c 13.4.  Infectious stool work-up negative.  No recent antibiotic or steroid use.  Vital signs stable. No fever or leukocytosis. Denies any tobacco or alcohol use  Denies NSAIDs, Anti-plt agents, and anticoagulants Denies family history of gastrointestinal disease and malignancy Previous Endoscopies: EGD 04/2020 - normal Pouchoscopy 04/2020 at [facility]- normal    Past Medical History:  Diagnosis Date   Cancer ([place])    Diabetes mellitus without complication ([place])      Past Surgical History:  Procedure Laterality Date   OTHER SURGICAL HISTORY     J pouch 1997, history of ileostomy with reversal    Allergies  Allergen Reactions   Sulfa Antibiotics Rash and Hives   Oxycodone Other (See Comments) and Rash    Rash and headache    Family History  Problem Relation Age of Onset   Colon cancer Sister    Crohn's disease Brother   IBD- father, brother, sister; Liver Disease- Sister  Social History   Tobacco Use   Smoking status: Never   Smokeless tobacco: Never  Vaping Use   Vaping Use: Never used  Substance Use Topics   Alcohol use: Never   Drug use: Never     Pertinent GI related history and allergies were reviewed with the patient  Review of Systems  Constitutional:  Negative for activity change, appetite change, chills, diaphoresis, fatigue, fever and unexpected weight change.  HENT:  Negative for trouble swallowing and voice change.   Respiratory:  Negative for shortness of breath and wheezing.   Cardiovascular:  Negative for chest pain, palpitations and leg swelling.  Gastrointestinal:  Positive for diarrhea. Negative for abdominal distention, abdominal pain, anal bleeding, blood in stool, constipation, nausea and vomiting.  Musculoskeletal:  Negative for arthralgias and myalgias.  Skin:  Negative for color change and pallor.  Neurological:  Negative for dizziness, syncope and weakness.  Psychiatric/Behavioral:  Negative for confusion. The patient is not nervous/anxious.   All other systems reviewed and are negative.    Medications Home Medications No current facility-administered medications on file prior to  encounter.   Current Outpatient Medications on File Prior to Encounter  Medication Sig Dispense Refill   JARDIANCE 10 MG TABS tablet Take 10 mg by mouth daily.     LANTUS SOLOSTAR 100 UNIT/ML Solostar Pen Inject 35 Units into the skin at bedtime.     LORazepam (ATIVAN) 0.5 MG tablet Take 1 tablet (0.5 mg total) by mouth  2 (two) times daily. 60 tablet 0   metFORMIN (GLUCOPHAGE) 1000 MG tablet Take 1,000 mg by mouth 2 (two) times daily.  2   mupirocin ointment (BACTROBAN) 2 % Apply 1 Application topically daily.     OLANZapine (ZYPREXA) 5 MG tablet Take 1.5 tablets (7.5 mg total) by mouth at bedtime. 45 tablet 0   omeprazole (PRILOSEC) 40 MG capsule Take 40 mg by mouth daily.   4   venlafaxine XR (EFFEXOR-XR) 150 MG 24 hr capsule Take 150 mg by mouth daily.  1   Pertinent GI related medications were reviewed with the patient  Inpatient Medications  Current Facility-Administered Medications:    acetaminophen (TYLENOL) tablet 650 mg, 650 mg, Oral, Q6H PRN, 650 mg at 04/08/22 0903 **OR** acetaminophen (TYLENOL) suppository 650 mg, 650 mg, Rectal, Q6H PRN, Mansy, Jan A, MD   diphenoxylate-atropine (LOMOTIL) 2.5-0.025 MG per tablet 2 tablet, 2 tablet, Oral, QID, Alanda Amass, Aseem, MD, 2 tablet at 04/09/22 0956   enoxaparin (LOVENOX) injection 40 mg, 40 mg, Subcutaneous, Q24H, Mansy, Jan A, MD, 40 mg at 04/09/22 0849   fentaNYL (SUBLIMAZE) injection 12.5 mcg, 12.5 mcg, Intravenous, Q2H PRN, Ivor Costa, MD   hydrALAZINE (APRESOLINE) injection 5 mg, 5 mg, Intravenous, Q2H PRN, Ivor Costa, MD   insulin aspart (novoLOG) injection 0-5 Units, 0-5 Units, Subcutaneous, QHS, Ivor Costa, MD, 2 Units at 04/07/22 2107   insulin aspart (novoLOG) injection 0-9 Units, 0-9 Units, Subcutaneous, TID WC, Benita Gutter, RPH, 5 Units at 04/09/22 0848   insulin aspart (novoLOG) injection 6 Units, 6 Units, Subcutaneous, TID WC, Vanna Scotland, MD, 6 Units at 04/09/22 0847   insulin glargine-yfgn (SEMGLEE) injection 35 Units, 35 Units, Subcutaneous, Daily, Mansy, Jan A, MD, 35 Units at 04/09/22 0849   LORazepam (ATIVAN) tablet 0.5 mg, 0.5 mg, Oral, TID, Ivor Costa, MD, 0.5 mg at 04/09/22 0847   magnesium hydroxide (MILK OF MAGNESIA) suspension 30 mL, 30 mL, Oral, Daily PRN, Mansy, Jan A, MD   mupirocin ointment (BACTROBAN) 2 % 1 Application, 1  Application, Topical, Daily, Mansy, Jan A, MD, 1 Application at 42/59/56 0850   OLANZapine (ZYPREXA) tablet 7.5 mg, 7.5 mg, Oral, QHS, Mansy, Jan A, MD, 7.5 mg at 04/08/22 2107   ondansetron (ZOFRAN) tablet 4 mg, 4 mg, Oral, Q6H PRN **OR** ondansetron (ZOFRAN) injection 4 mg, 4 mg, Intravenous, Q6H PRN, Mansy, Jan A, MD   pantoprazole (PROTONIX) EC tablet 40 mg, 40 mg, Oral, Daily, Mansy, Jan A, MD, 40 mg at 04/09/22 0847   traZODone (DESYREL) tablet 25 mg, 25 mg, Oral, QHS PRN, Mansy, Jan A, MD   venlafaxine XR (EFFEXOR-XR) 24 hr capsule 150 mg, 150 mg, Oral, Daily, Mansy, Jan A, MD, 150 mg at 04/09/22 0847   acetaminophen **OR** acetaminophen, fentaNYL (SUBLIMAZE) injection, hydrALAZINE, magnesium hydroxide, ondansetron **OR** ondansetron (ZOFRAN) IV, traZODone   Objective   Vitals:   04/08/22 1720 04/08/22 2159 04/09/22 0601 04/09/22 0820  BP: 92/71 115/73 98/64 115/82  Pulse: 83 85 80 79  Resp: 18 16 16 18   Temp: 98.1 F (36.7 C) 97.8 F (36.6 C) 97.8 F (36.6 C) 98 F (  36.7 C)  TempSrc:   Oral   SpO2: 99% 100% 97% 100%  Weight:      Height:         Physical Exam Vitals and nursing note reviewed.  Constitutional:      General: He is not in acute distress.    Appearance: Normal appearance. He is not ill-appearing, toxic-appearing or diaphoretic.  HENT:     Head: Normocephalic and atraumatic.     Nose: Nose normal.     Mouth/Throat:     Mouth: Mucous membranes are moist.     Pharynx: Oropharynx is clear.  Eyes:     General: No scleral icterus.    Extraocular Movements: Extraocular movements intact.  Cardiovascular:     Rate and Rhythm: Normal rate and regular rhythm.     Heart sounds: Normal heart sounds. No murmur heard.    No friction rub. No gallop.  Pulmonary:     Effort: Pulmonary effort is normal. No respiratory distress.     Breath sounds: Normal breath sounds. No wheezing, rhonchi or rales.  Abdominal:     General: Abdomen is flat. Bowel sounds are normal.  There is no distension.     Palpations: Abdomen is soft.     Tenderness: There is no abdominal tenderness. There is no guarding or rebound.     Comments: Well-healed surgical incision sites midline.  Well-healed previous PEG tube site  Musculoskeletal:     Cervical back: Neck supple.     Right lower leg: No edema.     Left lower leg: No edema.  Skin:    General: Skin is warm and dry.     Coloration: Skin is not jaundiced or pale.  Neurological:     General: No focal deficit present.     Mental Status: He is alert and oriented to person, place, and time. Mental status is at baseline.  Psychiatric:        Mood and Affect: Mood normal.        Behavior: Behavior normal.        Thought Content: Thought content normal.        Judgment: Judgment normal.     Laboratory Data Recent Labs  Lab 04/07/22 0037  WBC 7.4  HGB 12.9*  HCT 36.2*  PLT 188   Recent Labs  Lab 04/07/22 0037 04/07/22 0924 04/08/22 0045 04/08/22 0852 04/09/22 0803  NA 127*   < > 136 132* 135  K 4.2   < > 4.6 3.7 4.1  CL 97*   < > 107 101 107  CO2 20*   < > 26 23 23   BUN 31*   < > 29* 26* 21  CALCIUM 9.2   < > 8.6* 8.5* 8.9  PROT 7.4  --   --   --   --   BILITOT 0.9  --   --   --   --   ALKPHOS 316*  --   --   --   --   ALT 36  --   --   --   --   AST 34  --   --   --   --   GLUCOSE 494*   < > 278* 311* 251*   < > = values in this interval not displayed.   No results for input(s): "INR" in the last 168 hours.  Recent Labs    04/07/22 0037  LIPASE 44        Imaging Studies: US RENAL  Result Date: 04/08/2022 CLINICAL DATA:  Acute renal insufficiency EXAM: RENAL / URINARY TRACT ULTRASOUND COMPLETE COMPARISON:  CT scan of the abdomen and pelvis December 14, 2021 FINDINGS: Right Kidney: Renal measurements: 9.5 x 4.3 x 3.8 cm = volume: 82 mL. Increased cortical echogenicity. Left Kidney: Renal measurements: 10.1 x 5.9 x 5.3 cm = volume: 167 mL. Mild hydronephrosis. Increased cortical echogenicity.  Bladder: Appears normal for degree of bladder distention. Other: None. IMPRESSION: 1. Mild left-sided hydronephrosis, new since December 14, 2021. CT imaging could better look for an underlying cause. 2. Medical renal disease with increased cortical echogenicity. Electronically Signed   By: Dorise Bullion III M.D.   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 Diarrhea is documented in his oncology notes as well - Currently on lomotil in hospital - infectious w/u negative  - suspect multifactorial- may have osmotic component given uncontrolled blood sugars, potentially pouchitis however no urgency (last scope 04/2020 normal); pt on metformin and ppi - ptsd and anxiety can also contribute as well - consuming diet drinks; pt notes cramping relieved with BM and ambulation- may have IBS component as well - pt denies new medications or dose changes  # altered mental status  # Pseudohyponatremia in setting of hyperglycemia - on presentation glucose was 494 and \na was 127; corrected it was 133 - Potassium and Chloride were wnl - unlikely 2/2 GI losses  # Liver lesions on imaging - defer to oncology; previously seen on CT in March and in oncology note as far back as 06/2020  # Uncontrolled DM2- a1c >13  # AKI-resolving  # TBI, PTSD, Adjustment disorder with anxiety  # Diffuse large B Cell lymphoma  # HTN  Plan:  Plan for pouchoscopy tomorrow Full liquids the rest of the day and npo at midnight Hold dvt ppx Two enemas tomorrow morning Labs in am- bmp, cbc Continue lomotil Further recommendations pending pouchoscopy Supportive care as per primary  team Outside GI records reviewed  Pouchoscopy with possible biopsy, control of bleeding, polypectomy, and interventions as necessary has been discussed with the patient/patient representative. Informed consent was obtained from the patient/patient representative after explaining the indication, nature, and risks of the procedure including but not limited to death, bleeding, perforation, missed neoplasm/lesions, cardiorespiratory compromise, and reaction to medications. Opportunity for questions was given and appropriate answers were provided. Patient/patient representative has verbalized understanding is amenable to undergoing the procedure.   I personally performed the service.  Management of other medical comorbidities as per primary team  Thank you for allowing [place] to participate in this patient's care. Please don't hesitate to call if any questions or concerns arise.   Annamaria Helling, DO [facility]  Portions of the record may have been created with voice recognition software. Occasional wrong-word or 'sound-a-like' substitutions may have occurred due to the inherent limitations of voice recognition software.  Read the chart carefully and recognize, using context, where substitutions may have occurred.

## 2022-04-10 ENCOUNTER — Encounter: Payer: Self-pay | Admitting: Internal Medicine

## 2022-04-10 ENCOUNTER — Encounter
Admission: EM | Disposition: A | Discharge status: Home / Self Care | Payer: Self-pay | Source: Home / Self Care | Attending: General Practice

## 2022-04-10 ENCOUNTER — Inpatient Hospital Stay: Payer: Medicare Other | Admitting: Anesthesiology

## 2022-04-10 DIAGNOSIS — K91850 Pouchitis: Secondary | ICD-10-CM | POA: Diagnosis not present

## 2022-04-10 DIAGNOSIS — N1831 Chronic kidney disease, stage 3a: Secondary | ICD-10-CM | POA: Diagnosis not present

## 2022-04-10 DIAGNOSIS — N179 Acute kidney failure, unspecified: Secondary | ICD-10-CM | POA: Diagnosis not present

## 2022-04-10 HISTORY — PX: POUCHOSCOPY: SHX6321

## 2022-04-10 LAB — CBC WITH DIFFERENTIAL/PLATELET
Abs Immature Granulocytes: 0.07 10*3/uL (ref 0.00–0.07)
Basophils Absolute: 0.1 10*3/uL (ref 0.0–0.1)
Basophils Relative: 1 %
Eosinophils Absolute: 0.1 10*3/uL (ref 0.0–0.5)
Eosinophils Relative: 1 %
HCT: 38.3 % — ABNORMAL LOW (ref 39.0–52.0)
Hemoglobin: 13.1 g/dL (ref 13.0–17.0)
Immature Granulocytes: 1 %
Lymphocytes Relative: 17 %
Lymphs Abs: 1.2 10*3/uL (ref 0.7–4.0)
MCH: 28.1 pg (ref 26.0–34.0)
MCHC: 34.2 g/dL (ref 30.0–36.0)
MCV: 82 fL (ref 80.0–100.0)
Monocytes Absolute: 0.5 10*3/uL (ref 0.1–1.0)
Monocytes Relative: 8 %
Neutro Abs: 5.2 10*3/uL (ref 1.7–7.7)
Neutrophils Relative %: 72 %
Platelets: 168 10*3/uL (ref 150–400)
RBC: 4.67 MIL/uL (ref 4.22–5.81)
RDW: 13.2 % (ref 11.5–15.5)
WBC: 7.2 10*3/uL (ref 4.0–10.5)
nRBC: 0 % (ref 0.0–0.2)

## 2022-04-10 LAB — BASIC METABOLIC PANEL
Anion gap: 7 (ref 5–15)
BUN: 16 mg/dL (ref 8–23)
CO2: 19 mmol/L — ABNORMAL LOW (ref 22–32)
Calcium: 8.5 mg/dL — ABNORMAL LOW (ref 8.9–10.3)
Chloride: 108 mmol/L (ref 98–111)
Creatinine, Ser: 1.34 mg/dL — ABNORMAL HIGH (ref 0.61–1.24)
GFR, Estimated: 58 mL/min — ABNORMAL LOW (ref >60)
Glucose, Bld: 128 mg/dL — ABNORMAL HIGH (ref 70–99)
Potassium: 3.4 mmol/L — ABNORMAL LOW (ref 3.5–5.1)
Sodium: 134 mmol/L — ABNORMAL LOW (ref 135–145)

## 2022-04-10 LAB — MAGNESIUM: Magnesium: 2 mg/dL (ref 1.7–2.4)

## 2022-04-10 LAB — GLUCOSE, CAPILLARY
Glucose-Capillary: 99 mg/dL (ref 70–99)
Glucose-Capillary: 168 mg/dL — ABNORMAL HIGH (ref 70–99)
Glucose-Capillary: 160 mg/dL — ABNORMAL HIGH (ref 70–99)
Glucose-Capillary: 253 mg/dL — ABNORMAL HIGH (ref 70–99)

## 2022-04-10 SURGERY — ENDOSCOPY, POUCH, SMALL INTESTINE, DIAGNOSTIC
Anesthesia: General

## 2022-04-10 MED ORDER — LIDOCAINE HCL (PF) 2 % IJ SOLN
INTRAMUSCULAR | Status: AC
  Filled 2022-04-10: qty 5

## 2022-04-10 MED ORDER — METRONIDAZOLE 500 MG PO TABS
500.0000 mg | ORAL_TABLET | Freq: Two times a day (BID) | ORAL | Status: DC
  Administered 2022-04-10 – 2022-04-11 (×3): 500 mg via ORAL
  Filled 2022-04-10 (×4): qty 1

## 2022-04-10 MED ORDER — MELATONIN 5 MG PO TABS
2.5000 mg | ORAL_TABLET | Freq: Every evening | ORAL | Status: DC | PRN
  Administered 2022-04-10: 2.5 mg via ORAL
  Filled 2022-04-10: qty 1

## 2022-04-10 MED ORDER — PHENYLEPHRINE HCL (PRESSORS) 10 MG/ML IV SOLN
INTRAVENOUS | Status: DC | PRN
  Administered 2022-04-10 (×3): 80 ug via INTRAVENOUS

## 2022-04-10 MED ORDER — PHENYLEPHRINE 80 MCG/ML (10ML) SYRINGE FOR IV PUSH (FOR BLOOD PRESSURE SUPPORT)
PREFILLED_SYRINGE | INTRAVENOUS | Status: AC
  Filled 2022-04-10: qty 10

## 2022-04-10 MED ORDER — TRAZODONE HCL 50 MG PO TABS: 25.0000 mg | ORAL_TABLET | Freq: Every evening | ORAL | Status: DC | PRN

## 2022-04-10 MED ORDER — CIPROFLOXACIN HCL 500 MG PO TABS
500.0000 mg | ORAL_TABLET | Freq: Two times a day (BID) | ORAL | Status: DC
  Administered 2022-04-10 – 2022-04-11 (×2): 500 mg via ORAL
  Filled 2022-04-10 (×2): qty 1

## 2022-04-10 MED ORDER — SODIUM CHLORIDE 0.9 % IV SOLN: INTRAVENOUS | Status: DC

## 2022-04-10 MED ORDER — PROPOFOL 500 MG/50ML IV EMUL
INTRAVENOUS | Status: DC | PRN
  Administered 2022-04-10: 150 ug/kg/min via INTRAVENOUS

## 2022-04-10 MED ORDER — PROPOFOL 10 MG/ML IV BOLUS
INTRAVENOUS | Status: DC | PRN
  Administered 2022-04-10: 50 mg via INTRAVENOUS

## 2022-04-10 NOTE — Progress Notes (Signed)
Progress Note    [name]   INO:[id number]  DOB: 26-Sep-1955  DOA: 04/07/2022     3 PCP: Theotis Burrow, MD  Initial CC: diarrhea  Hospital Course: [name] is a 66 y.o. 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  Interval History:  Seen this afternoon in his room after returning from PACU. He was eating and denied any nausea or abdominal pain.  Assessment and Plan:  Acute renal failure superimposed on stage 3a chronic kidney disease (HCC) Baseline creatinine 1.2-1.3.  creatinine is 1.89, BUN 31, GFR 38.  Likely due to dehydration secondary to diarrhea. -Creatinine peaked at 1.89 and has been improving slowly -Nephrology following as well, appreciate assistance - Repeat BMP in a.m.  Pouchitis - underwent pouchoscopy on 04/10/2022 - Findings noted to show moderate in severity erosions, erythema, granularity, loss of vascularity, and aphthous ulcers in the rectal pouch - Follow-up pouchoscopy biopsies - Continue Cipro and Flagyl per GI recommendations - Outpatient follow-up with Dr. Alisia Ferrari  Hyponatremia, Complicated by confusion Resolving - likely due to GI losses Urine sodium low, low osm, ongoing GI losses established   Diarrhea Etiology is not clear.  Currently patient does not have abdominal pain.  No fever or leukocytosis.  C diff test negative.  GI pathogen panel negative. -Supportive care -As needed Zofran -Imodium 2 mg QID, if not controlled will need GI evaluation   UC (ulcerative colitis) ([place]) -s/p of colectomy and J-pouch   Type II diabetes mellitus with renal manifestations ([place]) -Continue Semglee and SSI   Adjustment disorder with anxiety Adjustment disorder with anxiety, panic  disorder -Ativan 0.5 mg 3 times daily -Continue home olanzapine and Effexor   Panic attack -see abvoe   Essential hypertension -Continue current regimen   Old records reviewed in assessment of this patient  Antimicrobials: Cipro 04/10/2022 >> current Flagyl 04/10/2022 >> current  DVT prophylaxis:  enoxaparin (LOVENOX) injection 40 mg Start: 04/07/22 0800   Code Status:   Code Status: Full Code  Mobility Assessment (last 72 hours)     Mobility Assessment     Row Name 04/10/22 1100 04/10/22 0806 04/09/22 2224 04/08/22 0820 04/07/22 2100   Does patient have an order for bedrest or is patient medically unstable No - Continue assessment No - Continue assessment No - Continue assessment No - Continue assessment No - Continue assessment   What is the highest level of mobility based on the progressive mobility assessment? Level 6 (Walks independently in room and hall) - Balance while walking in room without assist - Complete -- Level 6 (Walks independently in room and hall) - Balance while walking in room without assist - Complete Level 6 (Walks independently in room and hall) - Balance while walking in room without assist - Complete Level 6 (Walks independently in room and hall) - Balance while walking in room without assist - Complete            Disposition Plan: Home Wednesday Status is: Inpatient  Objective: Blood pressure 114/73, pulse 72, temperature 97.6 F (36.4 C), resp. rate 18, height 5' 10"  (1.778 m), weight 81.6 kg, SpO2 100 %.  Examination:  Physical Exam Constitutional:      General: He is not in acute distress.    Appearance: Normal appearance.  HENT:     Head: Normocephalic  and atraumatic.     Mouth/Throat:     Mouth: Mucous membranes are moist.  Eyes:     Extraocular Movements: Extraocular movements intact.  Cardiovascular:     Rate and Rhythm: Normal rate and regular rhythm.     Heart sounds: Normal heart sounds.  Pulmonary:     Effort: Pulmonary  effort is normal. No respiratory distress.     Breath sounds: Normal breath sounds. No wheezing.  Abdominal:     General: Bowel sounds are normal. There is no distension.     Palpations: Abdomen is soft.     Tenderness: There is no abdominal tenderness.  Musculoskeletal:        General: Normal range of motion.     Cervical back: Normal range of motion and neck supple.  Skin:    General: Skin is warm and dry.  Neurological:     General: No focal deficit present.     Mental Status: He is alert.  Psychiatric:        Mood and Affect: Mood normal.        Behavior: Behavior normal.      Consultants:  GI  Procedures:  Pouchoscopy, 04/10/2022  Data Reviewed: Results for orders placed or performed during the hospital encounter of 04/07/22 (from the past 24 hour(s))  Glucose, capillary     Status: Abnormal   Collection Time: 04/09/22  8:13 PM  Result Value Ref Range   Glucose-Capillary 243 (H) 70 - 99 mg/dL  Glucose, capillary     Status: None   Collection Time: 04/10/22  8:21 AM  Result Value Ref Range   Glucose-Capillary 99 70 - 99 mg/dL  Basic metabolic panel     Status: Abnormal   Collection Time: 04/10/22 10:14 AM  Result Value Ref Range   Sodium 134 (L) 135 - 145 mmol/L   Potassium 3.4 (L) 3.5 - 5.1 mmol/L   Chloride 108 98 - 111 mmol/L   CO2 19 (L) 22 - 32 mmol/L   Glucose, Bld 128 (H) 70 - 99 mg/dL   BUN 16 8 - 23 mg/dL   Creatinine, Ser 1.34 (H) 0.61 - 1.24 mg/dL   Calcium 8.5 (L) 8.9 - 10.3 mg/dL   GFR, Estimated 58 (L) >60 mL/min   Anion gap 7 5 - 15  CBC with Differential/Platelet     Status: Abnormal   Collection Time: 04/10/22 10:14 AM  Result Value Ref Range   WBC 7.2 4.0 - 10.5 K/uL   RBC 4.67 4.22 - 5.81 MIL/uL   Hemoglobin 13.1 13.0 - 17.0 g/dL   HCT 38.3 (L) 39.0 - 52.0 %   MCV 82.0 80.0 - 100.0 fL   MCH 28.1 26.0 - 34.0 pg   MCHC 34.2 30.0 - 36.0 g/dL   RDW 13.2 11.5 - 15.5 %   Platelets 168 150 - 400 K/uL   nRBC 0.0 0.0 - 0.2 %   Neutrophils  Relative % 72 %   Neutro Abs 5.2 1.7 - 7.7 K/uL   Lymphocytes Relative 17 %   Lymphs Abs 1.2 0.7 - 4.0 K/uL   Monocytes Relative 8 %   Monocytes Absolute 0.5 0.1 - 1.0 K/uL   Eosinophils Relative 1 %   Eosinophils Absolute 0.1 0.0 - 0.5 K/uL   Basophils Relative 1 %   Basophils Absolute 0.1 0.0 - 0.1 K/uL   Immature Granulocytes 1 %   Abs Immature Granulocytes 0.07 0.00 - 0.07 K/uL  Magnesium     Status: None  Collection Time: 04/10/22 10:14 AM  Result Value Ref Range   Magnesium 2.0 1.7 - 2.4 mg/dL  Glucose, capillary     Status: Abnormal   Collection Time: 04/10/22  2:54 PM  Result Value Ref Range   Glucose-Capillary 168 (H) 70 - 99 mg/dL  Glucose, capillary     Status: Abnormal   Collection Time: 04/10/22  4:36 PM  Result Value Ref Range   Glucose-Capillary 160 (H) 70 - 99 mg/dL    I have Reviewed nursing notes, Vitals, and Lab results since pt's last encounter. Pertinent lab results : see above I have ordered test including BMP, CBC, Mg I have reviewed the last note from staff over past 24 hours I have discussed pt's care plan and test results with nursing staff, case manager   LOS: 3 days   Dwyane Dee, MD [facility] 04/10/2022, 5:21 PM

## 2022-04-10 NOTE — Anesthesia Procedure Notes (Signed)
Procedure Name: MAC Date/Time: 04/10/2022 12:50 PM  Performed by: Jerrye Noble, CRNAPre-anesthesia Checklist: Patient identified, Suction available, Patient being monitored and Emergency Drugs available Patient Re-evaluated:Patient Re-evaluated prior to induction Oxygen Delivery Method: Nasal cannula

## 2022-04-10 NOTE — Interval H&P Note (Signed)
History and Physical Interval Note: Progress note from 04/10/22  was reviewed and there was no interval change after seeing and examining the patient.  Written consent was obtained from the patient after discussion of risks, benefits, and alternatives. Patient has consented to proceed with  Pouchoscopy  with possible intervention   04/10/2022 12:35 PM  [name]  has presented today for surgery, with the diagnosis of pouchitis.  The various methods of treatment have been discussed with the patient and family. After consideration of risks, benefits and other options for treatment, the patient has consented to  Procedure(s): POUCHOSCOPY (N/A) as a surgical intervention.  The patient's history has been reviewed, patient examined, no change in status, stable for surgery.  I have reviewed the patient's chart and labs.  Questions were answered to the patient's satisfaction.     Annamaria Helling

## 2022-04-10 NOTE — Anesthesia Postprocedure Evaluation (Signed)
Anesthesia Post Note  Patient: [name]  Procedure(s) Performed: POUCHOSCOPY  Patient location during evaluation: Endoscopy Anesthesia Type: General Level of consciousness: awake and alert Pain management: pain level controlled Vital Signs Assessment: post-procedure vital signs reviewed and stable Respiratory status: spontaneous breathing, nonlabored ventilation, respiratory function stable and patient connected to nasal cannula oxygen Cardiovascular status: blood pressure returned to baseline and stable Postop Assessment: no apparent nausea or vomiting Anesthetic complications: no   No notable events documented.   Last Vitals:  Vitals:   04/10/22 1356 04/10/22 1456  BP: 114/71 114/73  Pulse:  72  Resp:  18  Temp:  36.4 C  SpO2:  100%    Last Pain:  Vitals:   04/10/22 1356  TempSrc:   PainSc: 0-No pain                 Arita Miss

## 2022-04-10 NOTE — Care Management Important Message (Signed)
Important Message  Patient Details  Name: [name] MRN: [id number] Date of Birth: 1956/06/29   Medicare Important Message Given:  N/A - LOS <3 / Initial given by admissions     [name] 04/10/2022, 9:21 AM

## 2022-04-10 NOTE — Transfer of Care (Signed)
Immediate Anesthesia Transfer of Care Note  Patient: [name]  Procedure(s) Performed: POUCHOSCOPY  Patient Location: PACU and Endoscopy Unit  Anesthesia Type:General  Level of Consciousness: drowsy  Airway & Oxygen Therapy: Patient Spontanous Breathing  Post-op Assessment: Report given to RN and Post -op Vital signs reviewed and stable  Post vital signs: Reviewed and stable  Last Vitals:  Vitals Value Taken Time  BP 112/66 04/10/22 1337  Temp 35.6 C 04/10/22 1336  Pulse 73 04/10/22 1337  Resp 16 04/10/22 1337  SpO2 100 % 04/10/22 1337  Vitals shown include unvalidated device data.  Last Pain:  Vitals:   04/10/22 1336  TempSrc: Temporal  PainSc:          Complications: No notable events documented.

## 2022-04-10 NOTE — Hospital Course (Signed)
[name] is a 66 y.o. 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

## 2022-04-10 NOTE — Progress Notes (Signed)
[facility]  ROUNDING NOTE   Subjective:   Patient seen resting in bed Alert and oriented Currently NPO for EGD Denies pain and discomfort States diarrhea has decreased with admission  Creatinine 1.34   Objective:  Vital signs in last 24 hours:  Temp:  [96 F (35.6 C)-98 F (36.7 C)] 97.6 F (36.4 C) (07/18 1456) Pulse Rate:  [68-94] 72 (07/18 1456) Resp:  [16-20] 18 (07/18 1456) BP: (100-125)/(66-77) 114/73 (07/18 1456) SpO2:  [98 %-100 %] 100 % (07/18 1456)  Weight change:  Filed Weights   04/07/22 0027  Weight: 81.6 kg    Intake/Output: No intake/output data recorded.   Intake/Output this shift:  No intake/output data recorded.  Physical Exam: General: NAD  Head: Normocephalic, atraumatic. Moist oral mucosal membranes  Eyes: Anicteric  Lungs:  Clear to auscultation, normal effort  Heart: Regular rate and rhythm  Abdomen:  Soft, nontender  Extremities:  no peripheral edema.  Neurologic: Nonfocal, moving all four extremities  Skin: No lesions  Access: None    Basic Metabolic Panel: Recent Labs  Lab 04/07/22 1718 04/08/22 0045 04/08/22 0852 04/09/22 0803 04/10/22 1014  NA 132* 136 132* 135 134*  K 4.3 4.6 3.7 4.1 3.4*  CL 104 107 101 107 108  CO2 22 26 23 23  19*  GLUCOSE 210* 278* 311* 251* 128*  BUN 30* 29* 26* 21 16  CREATININE 1.69* 1.79* 1.57* 1.47* 1.34*  CALCIUM 8.9 8.6* 8.5* 8.9 8.5*  MG  --   --   --   --  2.0    Liver Function Tests: Recent Labs  Lab 04/07/22 0037  AST 34  ALT 36  ALKPHOS 316*  BILITOT 0.9  PROT 7.4  ALBUMIN 4.1   Recent Labs  Lab 04/07/22 0037  LIPASE 44   No results for input(s): "AMMONIA" in the last 168 hours.  CBC: Recent Labs  Lab 04/07/22 0037 04/10/22 1014  WBC 7.4 7.2  NEUTROABS  --  5.2  HGB 12.9* 13.1  HCT 36.2* 38.3*  MCV 79.0* 82.0  PLT 188 168    Cardiac Enzymes: No results for input(s): "CKTOTAL", "CKMB", "CKMBINDEX", "TROPONINI" in the last 168  hours.  BNP: Invalid input(s): "POCBNP"  CBG: Recent Labs  Lab 04/09/22 1127 04/09/22 1652 04/09/22 2013 04/10/22 0821 04/10/22 1454  GLUCAP 335* 123* 243* 99 168*    Microbiology: Results for orders placed or performed during the hospital encounter of 04/07/22  C Difficile Quick Screen w PCR reflex     Status: None   Collection Time: 04/07/22  9:24 AM   Specimen: STOOL  Result Value Ref Range Status   C Diff antigen NEGATIVE NEGATIVE Final   C Diff toxin NEGATIVE NEGATIVE Final   C Diff interpretation No C. difficile detected.  Final    Comment: Performed at [facility], [address] [place], [place] [address]  Gastrointestinal Panel by PCR , Stool     Status: None   Collection Time: 04/07/22  9:24 AM   Specimen: Stool  Result Value Ref Range Status   Campylobacter species NOT DETECTED NOT DETECTED Final   Plesimonas shigelloides NOT DETECTED NOT DETECTED Final   Salmonella species NOT DETECTED NOT DETECTED Final   Yersinia enterocolitica NOT DETECTED NOT DETECTED Final   Vibrio species NOT DETECTED NOT DETECTED Final   Vibrio cholerae NOT DETECTED NOT DETECTED Final   Enteroaggregative E coli (EAEC) NOT DETECTED NOT DETECTED Final   Enteropathogenic E coli (EPEC) NOT DETECTED NOT DETECTED  Final   Enterotoxigenic E coli (ETEC) NOT DETECTED NOT DETECTED Final   Shiga like toxin producing E coli (STEC) NOT DETECTED NOT DETECTED Final   Shigella/Enteroinvasive E coli (EIEC) NOT DETECTED NOT DETECTED Final   Cryptosporidium NOT DETECTED NOT DETECTED Final   Cyclospora cayetanensis NOT DETECTED NOT DETECTED Final   Entamoeba histolytica NOT DETECTED NOT DETECTED Final   Giardia lamblia NOT DETECTED NOT DETECTED Final   Adenovirus F40/41 NOT DETECTED NOT DETECTED Final   Astrovirus NOT DETECTED NOT DETECTED Final   Norovirus GI/GII NOT DETECTED NOT DETECTED Final   Rotavirus A NOT DETECTED NOT DETECTED Final   Sapovirus (I, II, IV, and V) NOT DETECTED NOT  DETECTED Final    Comment: Performed at [facility], [address] [place], [place] [id number]    Coagulation Studies: No results for input(s): "LABPROT", "INR" in the last 72 hours.  Urinalysis: No results for input(s): "COLORURINE", "LABSPEC", "PHURINE", "GLUCOSEU", "HGBUR", "BILIRUBINUR", "KETONESUR", "PROTEINUR", "UROBILINOGEN", "NITRITE", "LEUKOCYTESUR" in the last 72 hours.  Invalid input(s): "APPERANCEUR"    Imaging: CT ABDOMEN PELVIS WO CONTRAST  Result Date: 04/09/2022 CLINICAL DATA:  Acute renal insufficiency, diarrhea EXAM: CT ABDOMEN AND PELVIS WITHOUT CONTRAST TECHNIQUE: Multidetector CT imaging of the abdomen and pelvis was performed following the standard protocol without IV contrast. Unenhanced CT was performed per clinician order. Lack of IV contrast limits sensitivity and specificity, especially for evaluation of abdominal/pelvic solid viscera. RADIATION DOSE REDUCTION: This exam was performed according to the departmental dose-optimization program which includes automated exposure control, adjustment of the mA and/or kV according to patient size and/or use of iterative reconstruction technique. COMPARISON:  12/14/2021, 04/08/2022 FINDINGS: Lower chest: No acute pleural or parenchymal lung disease. Hepatobiliary: Calcified gallstone without evidence of acute cholecystitis. Unremarkable unenhanced appearance of the liver. No biliary duct dilation. Pancreas: Unremarkable unenhanced appearance. Spleen: Unremarkable unenhanced appearance. Adrenals/Urinary Tract: No evidence of urinary tract calculi or obstructive uropathy within either kidney. The mild left hydronephrosis seen on previous ultrasound has resolved in the interim. The adrenals are unremarkable. Bladder is decompressed, with nonspecific bladder wall thickening. Stomach/Bowel: Postsurgical changes are seen from prior partial colectomy and reanastomosis. There gas fluid levels within the residual rectosigmoid  colon consistent with given history of diarrhea. No evidence of bowel obstruction or ileus. No bowel wall thickening or inflammatory change. Vascular/Lymphatic: Aortic atherosclerosis. No enlarged abdominal or pelvic lymph nodes. Reproductive: Prostate is unremarkable. Other: No free fluid or free intraperitoneal gas. No abdominal wall hernia. Musculoskeletal: No acute or destructive bony lesions. Reconstructed images demonstrate no additional findings. IMPRESSION: 1. Interval resolution of the left-sided hydronephrosis seen on recent ultrasound. No urinary tract calculi or obstructive uropathy on this exam. 2. Colonic gas fluid levels consistent with history of diarrhea. No bowel obstruction or ileus. 3. Decompressed urinary bladder, which limits evaluation. Nonspecific bladder wall thickening felt to be due to decompressed state. 4. Cholelithiasis without cholecystitis. 5.  Aortic Atherosclerosis (ICD10-I70.0). Electronically Signed   By: Randa Ngo M.D.   On: 04/09/2022 15:22     Medications:     ciprofloxacin  500 mg Oral BID   enoxaparin (LOVENOX) injection  40 mg Subcutaneous Q24H   insulin aspart  0-5 Units Subcutaneous QHS   insulin aspart  0-9 Units Subcutaneous TID WC   insulin aspart  8 Units Subcutaneous TID WC   insulin glargine-yfgn  40 Units Subcutaneous Daily   LORazepam  0.5 mg Oral TID   metroNIDAZOLE  500 mg Oral Q12H   mupirocin ointment  1 Application Topical Daily   OLANZapine  7.5 mg Oral QHS   pantoprazole  40 mg Oral Daily   venlafaxine XR  150 mg Oral Daily   acetaminophen **OR** acetaminophen, fentaNYL (SUBLIMAZE) injection, hydrALAZINE, melatonin, ondansetron **OR** ondansetron (ZOFRAN) IV, traZODone  Assessment/ Plan:  Mr. [name] is a 67 y.o.  male with past medical history including PTSD, diabetes, UC, diffuse large B cell lymphoma, GERD, hypertension, and chronic kidney disease stage II,, who was admitted to [facility] on 04/07/2022 for Hyperglycemia  [R73.9] AKI (acute kidney injury) ([place]) [N17.9] Diarrhea, unspecified type [R19.7]    Acute kidney injury with chronic kidney disease stage II. Baseline creatinine 1.26 an GFR 63 on 01/04/22. Acute kidney injury likely secondary to hydronephrosis vs hypovolemia. No IV contrast exposure. Jardiance and metformin held. Renal ultrasound shows mild left hydronephrosis. No acute indication for dialysis at this time. Renal function elevated on admission.  Renal function continues to slowly improve with IVF. No acute need for dialysis. Continue iVF until patient able to maintain oral intake. Avoid nephrotoxic agents and therapies.    Lab Results  Component Value Date   CREATININE 1.34 (H) 04/10/2022   CREATININE 1.47 (H) 04/09/2022   CREATININE 1.57 (H) 04/08/2022   No intake or output data in the 24 hours ending 04/10/22 1459  2. Hyponatremia possibly due to acute kidney injury or Right upper lobe mass seen on CT imaging in Jan 2022. Patient has had no follow up for this finding.   Sodium improved to 134 with IVF.    LOS: 3   7/18/20232:59 PM

## 2022-04-10 NOTE — H&P (View-Only) (Signed)
Inpatient Follow-up/Progress Note   Patient ID: [name] is a 66 y.o. male.  Overnight Events / Subjective Findings NAEON. Only 5 BM yesterday per nursing record. Pt rcd 2 enemas this morning in anticipation of pouchoscopy today. No abdominal pain. Was tolerating PO yesterday. NPO since midnight No other acute GI complaints.  Review of Systems  Constitutional:  Negative for activity change, appetite change, chills, diaphoresis, fatigue, fever and unexpected weight change.  HENT:  Negative for trouble swallowing and voice change.   Respiratory:  Negative for shortness of breath and wheezing.   Cardiovascular:  Negative for chest pain, palpitations and leg swelling.  Gastrointestinal:  Positive for diarrhea. Negative for abdominal distention, abdominal pain, anal bleeding, blood in stool, constipation, nausea and vomiting.  Musculoskeletal:  Negative for arthralgias and myalgias.  Skin:  Negative for color change and pallor.  Neurological:  Negative for dizziness, syncope and weakness.  Psychiatric/Behavioral:  Negative for confusion. The patient is not nervous/anxious.   All other systems reviewed and are negative.    Medications  Current Facility-Administered Medications:    0.9 %  sodium chloride infusion, , Intravenous, Continuous, Annamaria Helling, DO, Last Rate: 20 mL/hr at 04/10/22 0935, New Bag at 04/10/22 0935   acetaminophen (TYLENOL) tablet 650 mg, 650 mg, Oral, Q6H PRN, 650 mg at 04/08/22 5366 **OR** acetaminophen (TYLENOL) suppository 650 mg, 650 mg, Rectal, Q6H PRN, Mansy, Jan A, MD   enoxaparin (LOVENOX) injection 40 mg, 40 mg, Subcutaneous, Q24H, Mansy, Jan A, MD, 40 mg at 04/09/22 0849   fentaNYL (SUBLIMAZE) injection 12.5 mcg, 12.5 mcg, Intravenous, Q2H PRN, Ivor Costa, MD   hydrALAZINE (APRESOLINE) injection 5 mg, 5 mg, Intravenous, Q2H PRN, Ivor Costa, MD   insulin aspart (novoLOG) injection 0-5 Units, 0-5 Units, Subcutaneous, QHS, Ivor Costa, MD, 2 Units  at 04/09/22 2222   insulin aspart (novoLOG) injection 0-9 Units, 0-9 Units, Subcutaneous, TID WC, Benita Gutter, RPH, 1 Units at 04/09/22 1709   insulin aspart (novoLOG) injection 8 Units, 8 Units, Subcutaneous, TID WC, Alanda Amass, Aseem, MD, 8 Units at 04/09/22 1709   insulin glargine-yfgn (SEMGLEE) injection 40 Units, 40 Units, Subcutaneous, Daily, Alanda Amass, Aseem, MD   LORazepam (ATIVAN) tablet 0.5 mg, 0.5 mg, Oral, TID, Ivor Costa, MD, 0.5 mg at 04/10/22 0930   melatonin tablet 2.5 mg, 2.5 mg, Oral, QHS PRN, Mansy, Jan A, MD, 2.5 mg at 04/10/22 0223   mupirocin ointment (BACTROBAN) 2 % 1 Application, 1 Application, Topical, Daily, Mansy, Jan A, MD, 1 Application at 44/03/47 0931   OLANZapine (ZYPREXA) tablet 7.5 mg, 7.5 mg, Oral, QHS, Mansy, Jan A, MD, 7.5 mg at 04/09/22 2221   ondansetron (ZOFRAN) tablet 4 mg, 4 mg, Oral, Q6H PRN **OR** ondansetron (ZOFRAN) injection 4 mg, 4 mg, Intravenous, Q6H PRN, Mansy, Jan A, MD   pantoprazole (PROTONIX) EC tablet 40 mg, 40 mg, Oral, Daily, Mansy, Jan A, MD, 40 mg at 04/10/22 0929   traZODone (DESYREL) tablet 25 mg, 25 mg, Oral, QHS PRN, Mansy, Jan A, MD, 25 mg at 04/09/22 2222   venlafaxine XR (EFFEXOR-XR) 24 hr capsule 150 mg, 150 mg, Oral, Daily, Mansy, Jan A, MD, 150 mg at 04/10/22 0930  sodium chloride 20 mL/hr at 04/10/22 0935    acetaminophen **OR** acetaminophen, fentaNYL (SUBLIMAZE) injection, hydrALAZINE, melatonin, ondansetron **OR** ondansetron (ZOFRAN) IV, traZODone   Objective    Vitals:   04/09/22 0820 04/09/22 1651 04/09/22 2030 04/10/22 0905  BP: 115/82 116/72 125/67 100/67  Pulse: 79 94 87 87  Resp: 18 20 18    Temp: 98 F (36.7 C) 97.8 F (36.6 C) 98 F (36.7 C)   TempSrc:  Oral Oral   SpO2: 100% 100% 98% 99%  Weight:      Height:         Physical Exam Vitals and nursing note reviewed.  Constitutional:      General: He is not in acute distress.    Appearance: Normal appearance. He is not ill-appearing, toxic-appearing or  diaphoretic.  HENT:     Head: Normocephalic and atraumatic.     Nose: Nose normal.     Mouth/Throat:     Mouth: Mucous membranes are moist.     Pharynx: Oropharynx is clear.  Eyes:     General: No scleral icterus.    Extraocular Movements: Extraocular movements intact.  Cardiovascular:     Rate and Rhythm: Normal rate and regular rhythm.     Heart sounds: Normal heart sounds. No murmur heard.    No friction rub. No gallop.  Pulmonary:     Effort: Pulmonary effort is normal. No respiratory distress.     Breath sounds: Normal breath sounds. No wheezing, rhonchi or rales.  Abdominal:     General: Abdomen is flat. Bowel sounds are normal. There is no distension.     Palpations: Abdomen is soft.     Tenderness: There is no abdominal tenderness. There is no guarding or rebound.     Comments: Well-healed surgical incision sites midline.  Well-healed previous PEG tube site   Musculoskeletal:     Cervical back: Neck supple.     Right lower leg: No edema.     Left lower leg: No edema.  Skin:    General: Skin is warm and dry.     Coloration: Skin is not jaundiced or pale.  Neurological:     General: No focal deficit present.     Mental Status: He is alert and oriented to person, place, and time. Mental status is at baseline.  Psychiatric:        Mood and Affect: Mood normal.        Behavior: Behavior normal.        Thought Content: Thought content normal.        Judgment: Judgment normal.      Laboratory Data Recent Labs  Lab 04/07/22 0037  WBC 7.4  HGB 12.9*  HCT 36.2*  PLT 188   Recent Labs  Lab 04/07/22 0037 04/07/22 0924 04/08/22 0045 04/08/22 0852 04/09/22 0803  NA 127*   < > 136 132* 135  K 4.2   < > 4.6 3.7 4.1  CL 97*   < > 107 101 107  CO2 20*   < > 26 23 23   BUN 31*   < > 29* 26* 21  CREATININE 1.89*   < > 1.79* 1.57* 1.47*  CALCIUM 9.2   < > 8.6* 8.5* 8.9  PROT 7.4  --   --   --   --   BILITOT 0.9  --   --   --   --   ALKPHOS 316*  --   --   --   --    ALT 36  --   --   --   --   AST 34  --   --   --   --   GLUCOSE 494*   < > 278* 311* 251*   < > = values in this interval not displayed.   No  results for input(s): "INR" in the last 168 hours.    Imaging Studies: CT ABDOMEN PELVIS WO CONTRAST  Result Date: 04/09/2022 CLINICAL DATA:  Acute renal insufficiency, diarrhea EXAM: CT ABDOMEN AND PELVIS WITHOUT CONTRAST TECHNIQUE: Multidetector CT imaging of the abdomen and pelvis was performed following the standard protocol without IV contrast. Unenhanced CT was performed per clinician order. Lack of IV contrast limits sensitivity and specificity, especially for evaluation of abdominal/pelvic solid viscera. RADIATION DOSE REDUCTION: This exam was performed according to the departmental dose-optimization program which includes automated exposure control, adjustment of the mA and/or kV according to patient size and/or use of iterative reconstruction technique. COMPARISON:  12/14/2021, 04/08/2022 FINDINGS: Lower chest: No acute pleural or parenchymal lung disease. Hepatobiliary: Calcified gallstone without evidence of acute cholecystitis. Unremarkable unenhanced appearance of the liver. No biliary duct dilation. Pancreas: Unremarkable unenhanced appearance. Spleen: Unremarkable unenhanced appearance. Adrenals/Urinary Tract: No evidence of urinary tract calculi or obstructive uropathy within either kidney. The mild left hydronephrosis seen on previous ultrasound has resolved in the interim. The adrenals are unremarkable. Bladder is decompressed, with nonspecific bladder wall thickening. Stomach/Bowel: Postsurgical changes are seen from prior partial colectomy and reanastomosis. There gas fluid levels within the residual rectosigmoid colon consistent with given history of diarrhea. No evidence of bowel obstruction or ileus. No bowel wall thickening or inflammatory change. Vascular/Lymphatic: Aortic atherosclerosis. No enlarged abdominal or pelvic lymph nodes.  Reproductive: Prostate is unremarkable. Other: No free fluid or free intraperitoneal gas. No abdominal wall hernia. Musculoskeletal: No acute or destructive bony lesions. Reconstructed images demonstrate no additional findings. IMPRESSION: 1. Interval resolution of the left-sided hydronephrosis seen on recent ultrasound. No urinary tract calculi or obstructive uropathy on this exam. 2. Colonic gas fluid levels consistent with history of diarrhea. No bowel obstruction or ileus. 3. Decompressed urinary bladder, which limits evaluation. Nonspecific bladder wall thickening felt to be due to decompressed state. 4. Cholelithiasis without cholecystitis. 5.  Aortic Atherosclerosis (ICD10-I70.0). Electronically Signed   By: Randa Ngo M.D.   On: 04/09/2022 15:22   US RENAL  Result Date: 04/08/2022 CLINICAL DATA:  Acute renal insufficiency EXAM: RENAL / URINARY TRACT ULTRASOUND COMPLETE COMPARISON:  CT scan of the abdomen and pelvis December 14, 2021 FINDINGS: Right Kidney: Renal measurements: 9.5 x 4.3 x 3.8 cm = volume: 82 mL. Increased cortical echogenicity. Left Kidney: Renal measurements: 10.1 x 5.9 x 5.3 cm = volume: 167 mL. Mild hydronephrosis. Increased cortical echogenicity. Bladder: Appears normal for degree of bladder distention. Other: None. IMPRESSION: 1. Mild left-sided hydronephrosis, new since December 14, 2021. CT imaging could better look for an underlying cause. 2. Medical renal disease with increased cortical echogenicity. Electronically Signed   By: Dorise Bullion III M.D.   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 Diarrhea is documented in his oncology notes as  well - Currently on lomotil in hospital - infectious w/u negative   - suspect multifactorial- may have osmotic component given uncontrolled blood sugars, potentially pouchitis however no urgency (last scope 04/2020 normal); pt on metformin and ppi - ptsd and anxiety can also contribute as well - consuming diet drinks; pt  notes cramping relieved with BM and ambulation- may have IBS component as well - pt denies new medications or dose changes   # altered mental status   # hyponatremia in setting of hyperglycemia - on presentation glucose was 494 and na was 127; corrected it was 133 - Potassium and Chloride were wnl - unlikely 2/2 GI losses   # Liver lesions on imaging - defer to oncology; previously seen on CT in March and in oncology note as far back as 06/2020   # Uncontrolled DM2- a1c >13   # AKI-resolving   # TBI, PTSD, Adjustment disorder with anxiety   # Diffuse large B Cell lymphoma   # HTN  Plan:  Pouchoscopy today- pt rcd 2 enemas NPO since midnight Hold dvt ppx Continue lomotil Supportive care as per primary team  Further recommendations pending endoscopy; please see report for further details  Pouchoscopy with possible biopsy, control of bleeding, polypectomy, and interventions as necessary has been discussed with the patient/patient representative. Informed consent was obtained from the patient/patient representative after explaining the indication, nature, and risks of the procedure including but not limited to death, bleeding, perforation, missed neoplasm/lesions, cardiorespiratory compromise, and reaction to medications. Opportunity for questions was given and appropriate answers were provided. Patient/patient representative has verbalized understanding is amenable to undergoing the procedure.  I personally performed the service.  Management of other medical comorbidities as per primary team  Thank you for allowing [place] to participate in this patient's care.  Please don't hesitate to call if any questions or concerns arise.   Annamaria Helling, DO [facility]  Portions of the record may have been created with voice recognition software. Occasional wrong-word or 'sound-a-like' substitutions may have occurred due to the inherent limitations of voice recognition software.  Read the chart carefully and recognize, using context, where substitutions may have occurred.

## 2022-04-10 NOTE — Progress Notes (Signed)
Inpatient Follow-up/Progress Note   Patient ID: [name] is a 66 y.o. male.  Overnight Events / Subjective Findings NAEON. Only 5 BM yesterday per nursing record. Pt rcd 2 enemas this morning in anticipation of pouchoscopy today. No abdominal pain. Was tolerating PO yesterday. NPO since midnight No other acute GI complaints.  Review of Systems  Constitutional:  Negative for activity change, appetite change, chills, diaphoresis, fatigue, fever and unexpected weight change.  HENT:  Negative for trouble swallowing and voice change.   Respiratory:  Negative for shortness of breath and wheezing.   Cardiovascular:  Negative for chest pain, palpitations and leg swelling.  Gastrointestinal:  Positive for diarrhea. Negative for abdominal distention, abdominal pain, anal bleeding, blood in stool, constipation, nausea and vomiting.  Musculoskeletal:  Negative for arthralgias and myalgias.  Skin:  Negative for color change and pallor.  Neurological:  Negative for dizziness, syncope and weakness.  Psychiatric/Behavioral:  Negative for confusion. The patient is not nervous/anxious.   All other systems reviewed and are negative.    Medications  Current Facility-Administered Medications:    0.9 %  sodium chloride infusion, , Intravenous, Continuous, Annamaria Helling, DO, Last Rate: 20 mL/hr at 04/10/22 0935, New Bag at 04/10/22 0935   acetaminophen (TYLENOL) tablet 650 mg, 650 mg, Oral, Q6H PRN, 650 mg at 04/08/22 3419 **OR** acetaminophen (TYLENOL) suppository 650 mg, 650 mg, Rectal, Q6H PRN, Mansy, Jan A, MD   enoxaparin (LOVENOX) injection 40 mg, 40 mg, Subcutaneous, Q24H, Mansy, Jan A, MD, 40 mg at 04/09/22 0849   fentaNYL (SUBLIMAZE) injection 12.5 mcg, 12.5 mcg, Intravenous, Q2H PRN, Ivor Costa, MD   hydrALAZINE (APRESOLINE) injection 5 mg, 5 mg, Intravenous, Q2H PRN, Ivor Costa, MD   insulin aspart (novoLOG) injection 0-5 Units, 0-5 Units, Subcutaneous, QHS, Ivor Costa, MD, 2 Units  at 04/09/22 2222   insulin aspart (novoLOG) injection 0-9 Units, 0-9 Units, Subcutaneous, TID WC, Benita Gutter, RPH, 1 Units at 04/09/22 1709   insulin aspart (novoLOG) injection 8 Units, 8 Units, Subcutaneous, TID WC, Alanda Amass, Aseem, MD, 8 Units at 04/09/22 1709   insulin glargine-yfgn (SEMGLEE) injection 40 Units, 40 Units, Subcutaneous, Daily, Alanda Amass, Aseem, MD   LORazepam (ATIVAN) tablet 0.5 mg, 0.5 mg, Oral, TID, Ivor Costa, MD, 0.5 mg at 04/10/22 0930   melatonin tablet 2.5 mg, 2.5 mg, Oral, QHS PRN, Mansy, Jan A, MD, 2.5 mg at 04/10/22 0223   mupirocin ointment (BACTROBAN) 2 % 1 Application, 1 Application, Topical, Daily, Mansy, Jan A, MD, 1 Application at 37/90/24 0931   OLANZapine (ZYPREXA) tablet 7.5 mg, 7.5 mg, Oral, QHS, Mansy, Jan A, MD, 7.5 mg at 04/09/22 2221   ondansetron (ZOFRAN) tablet 4 mg, 4 mg, Oral, Q6H PRN **OR** ondansetron (ZOFRAN) injection 4 mg, 4 mg, Intravenous, Q6H PRN, Mansy, Jan A, MD   pantoprazole (PROTONIX) EC tablet 40 mg, 40 mg, Oral, Daily, Mansy, Jan A, MD, 40 mg at 04/10/22 0929   traZODone (DESYREL) tablet 25 mg, 25 mg, Oral, QHS PRN, Mansy, Jan A, MD, 25 mg at 04/09/22 2222   venlafaxine XR (EFFEXOR-XR) 24 hr capsule 150 mg, 150 mg, Oral, Daily, Mansy, Jan A, MD, 150 mg at 04/10/22 0930  sodium chloride 20 mL/hr at 04/10/22 0935    acetaminophen **OR** acetaminophen, fentaNYL (SUBLIMAZE) injection, hydrALAZINE, melatonin, ondansetron **OR** ondansetron (ZOFRAN) IV, traZODone   Objective    Vitals:   04/09/22 0820 04/09/22 1651 04/09/22 2030 04/10/22 0905  BP: 115/82 116/72 125/67 100/67  Pulse: 79 94 87 87  Resp: 18 20 18    Temp: 98 F (36.7 C) 97.8 F (36.6 C) 98 F (36.7 C)   TempSrc:  Oral Oral   SpO2: 100% 100% 98% 99%  Weight:      Height:         Physical Exam Vitals and nursing note reviewed.  Constitutional:      General: He is not in acute distress.    Appearance: Normal appearance. He is not ill-appearing, toxic-appearing or  diaphoretic.  HENT:     Head: Normocephalic and atraumatic.     Nose: Nose normal.     Mouth/Throat:     Mouth: Mucous membranes are moist.     Pharynx: Oropharynx is clear.  Eyes:     General: No scleral icterus.    Extraocular Movements: Extraocular movements intact.  Cardiovascular:     Rate and Rhythm: Normal rate and regular rhythm.     Heart sounds: Normal heart sounds. No murmur heard.    No friction rub. No gallop.  Pulmonary:     Effort: Pulmonary effort is normal. No respiratory distress.     Breath sounds: Normal breath sounds. No wheezing, rhonchi or rales.  Abdominal:     General: Abdomen is flat. Bowel sounds are normal. There is no distension.     Palpations: Abdomen is soft.     Tenderness: There is no abdominal tenderness. There is no guarding or rebound.     Comments: Well-healed surgical incision sites midline.  Well-healed previous PEG tube site   Musculoskeletal:     Cervical back: Neck supple.     Right lower leg: No edema.     Left lower leg: No edema.  Skin:    General: Skin is warm and dry.     Coloration: Skin is not jaundiced or pale.  Neurological:     General: No focal deficit present.     Mental Status: He is alert and oriented to person, place, and time. Mental status is at baseline.  Psychiatric:        Mood and Affect: Mood normal.        Behavior: Behavior normal.        Thought Content: Thought content normal.        Judgment: Judgment normal.      Laboratory Data Recent Labs  Lab 04/07/22 0037  WBC 7.4  HGB 12.9*  HCT 36.2*  PLT 188   Recent Labs  Lab 04/07/22 0037 04/07/22 0924 04/08/22 0045 04/08/22 0852 04/09/22 0803  NA 127*   < > 136 132* 135  K 4.2   < > 4.6 3.7 4.1  CL 97*   < > 107 101 107  CO2 20*   < > 26 23 23   BUN 31*   < > 29* 26* 21  CREATININE 1.89*   < > 1.79* 1.57* 1.47*  CALCIUM 9.2   < > 8.6* 8.5* 8.9  PROT 7.4  --   --   --   --   BILITOT 0.9  --   --   --   --   ALKPHOS 316*  --   --   --   --    ALT 36  --   --   --   --   AST 34  --   --   --   --   GLUCOSE 494*   < > 278* 311* 251*   < > = values in this interval not displayed.   No  results for input(s): "INR" in the last 168 hours.    Imaging Studies: CT ABDOMEN PELVIS WO CONTRAST  Result Date: 04/09/2022 CLINICAL DATA:  Acute renal insufficiency, diarrhea EXAM: CT ABDOMEN AND PELVIS WITHOUT CONTRAST TECHNIQUE: Multidetector CT imaging of the abdomen and pelvis was performed following the standard protocol without IV contrast. Unenhanced CT was performed per clinician order. Lack of IV contrast limits sensitivity and specificity, especially for evaluation of abdominal/pelvic solid viscera. RADIATION DOSE REDUCTION: This exam was performed according to the departmental dose-optimization program which includes automated exposure control, adjustment of the mA and/or kV according to patient size and/or use of iterative reconstruction technique. COMPARISON:  12/14/2021, 04/08/2022 FINDINGS: Lower chest: No acute pleural or parenchymal lung disease. Hepatobiliary: Calcified gallstone without evidence of acute cholecystitis. Unremarkable unenhanced appearance of the liver. No biliary duct dilation. Pancreas: Unremarkable unenhanced appearance. Spleen: Unremarkable unenhanced appearance. Adrenals/Urinary Tract: No evidence of urinary tract calculi or obstructive uropathy within either kidney. The mild left hydronephrosis seen on previous ultrasound has resolved in the interim. The adrenals are unremarkable. Bladder is decompressed, with nonspecific bladder wall thickening. Stomach/Bowel: Postsurgical changes are seen from prior partial colectomy and reanastomosis. There gas fluid levels within the residual rectosigmoid colon consistent with given history of diarrhea. No evidence of bowel obstruction or ileus. No bowel wall thickening or inflammatory change. Vascular/Lymphatic: Aortic atherosclerosis. No enlarged abdominal or pelvic lymph nodes.  Reproductive: Prostate is unremarkable. Other: No free fluid or free intraperitoneal gas. No abdominal wall hernia. Musculoskeletal: No acute or destructive bony lesions. Reconstructed images demonstrate no additional findings. IMPRESSION: 1. Interval resolution of the left-sided hydronephrosis seen on recent ultrasound. No urinary tract calculi or obstructive uropathy on this exam. 2. Colonic gas fluid levels consistent with history of diarrhea. No bowel obstruction or ileus. 3. Decompressed urinary bladder, which limits evaluation. Nonspecific bladder wall thickening felt to be due to decompressed state. 4. Cholelithiasis without cholecystitis. 5.  Aortic Atherosclerosis (ICD10-I70.0). Electronically Signed   By: Randa Ngo M.D.   On: 04/09/2022 15:22   US RENAL  Result Date: 04/08/2022 CLINICAL DATA:  Acute renal insufficiency EXAM: RENAL / URINARY TRACT ULTRASOUND COMPLETE COMPARISON:  CT scan of the abdomen and pelvis December 14, 2021 FINDINGS: Right Kidney: Renal measurements: 9.5 x 4.3 x 3.8 cm = volume: 82 mL. Increased cortical echogenicity. Left Kidney: Renal measurements: 10.1 x 5.9 x 5.3 cm = volume: 167 mL. Mild hydronephrosis. Increased cortical echogenicity. Bladder: Appears normal for degree of bladder distention. Other: None. IMPRESSION: 1. Mild left-sided hydronephrosis, new since December 14, 2021. CT imaging could better look for an underlying cause. 2. Medical renal disease with increased cortical echogenicity. Electronically Signed   By: Dorise Bullion III M.D.   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 Diarrhea is documented in his oncology notes as  well - Currently on lomotil in hospital - infectious w/u negative   - suspect multifactorial- may have osmotic component given uncontrolled blood sugars, potentially pouchitis however no urgency (last scope 04/2020 normal); pt on metformin and ppi - ptsd and anxiety can also contribute as well - consuming diet drinks; pt  notes cramping relieved with BM and ambulation- may have IBS component as well - pt denies new medications or dose changes   # altered mental status   # hyponatremia in setting of hyperglycemia - on presentation glucose was 494 and na was 127; corrected it was 133 - Potassium and Chloride were wnl - unlikely 2/2 GI losses   # Liver lesions on imaging - defer to oncology; previously seen on CT in March and in oncology note as far back as 06/2020   # Uncontrolled DM2- a1c >13   # AKI-resolving   # TBI, PTSD, Adjustment disorder with anxiety   # Diffuse large B Cell lymphoma   # HTN  Plan:  Pouchoscopy today- pt rcd 2 enemas NPO since midnight Hold dvt ppx Continue lomotil Supportive care as per primary team  Further recommendations pending endoscopy; please see report for further details  Pouchoscopy with possible biopsy, control of bleeding, polypectomy, and interventions as necessary has been discussed with the patient/patient representative. Informed consent was obtained from the patient/patient representative after explaining the indication, nature, and risks of the procedure including but not limited to death, bleeding, perforation, missed neoplasm/lesions, cardiorespiratory compromise, and reaction to medications. Opportunity for questions was given and appropriate answers were provided. Patient/patient representative has verbalized understanding is amenable to undergoing the procedure.  I personally performed the service.  Management of other medical comorbidities as per primary team  Thank you for allowing [place] to participate in this patient's care.  Please don't hesitate to call if any questions or concerns arise.   Annamaria Helling, DO [facility] Gastroenter[facility]  Portions of the record may have been created with voice recognition software. Occasional wrong-word or 'sound-a-like' substitutions may have occurred due to the inherent limitations of voice recognition software.  Read the chart carefully and recognize, using context, where substitutions may have occurred.

## 2022-04-10 NOTE — Op Note (Addendum)
[facility] Gastroenterology Patient Name: [name] Procedure Date: 04/10/2022 10:39 AM MRN: [id number] Account #: [id number] Date of Birth: 05/17/1956 Admit Type: Inpatient Age: 66 Room: [facility] ENDO ROOM 3 Gender: Male Note Status: [place] Instrument Name: Michaelle Birks [id number] Procedure:             Pouchoscopy Indications:           Chronic diarrhea Providers:             Rueben Bash, DO Referring MD:          Theotis Burrow (Referring MD) Medicines:             Monitored Anesthesia Care Complications:         No immediate complications. Estimated blood loss:                         Minimal. Procedure:             Pre-Anesthesia Assessment:                        - Prior to the procedure, a History and Physical was                         performed, and patient medications and allergies were                         reviewed. The patient is competent. The risks and                         benefits of the procedure and the sedation options and                         risks were discussed with the patient. All questions                         were answered and informed consent was obtained.                         Patient identification and proposed procedure were                         verified by the physician, the nurse, the anesthetist                         and the technician in the endoscopy suite. Mental                         Status Examination: alert and oriented. Airway                         Examination: normal oropharyngeal airway and neck                         mobility. Respiratory Examination: clear to                         auscultation. CV Examination: RRR, no murmurs, no S3  or S4. Prophylactic Antibiotics: The patient does not                         require prophylactic antibiotics. Prior                         Anticoagulants: The patient has taken no previous                          anticoagulant or antiplatelet agents. ASA Grade                         Assessment: III - A patient with severe systemic                         disease. After reviewing the risks and benefits, the                         patient was deemed in satisfactory condition to                         undergo the procedure. The anesthesia plan was to use                         monitored anesthesia care (MAC). Immediately prior to                         administration of medications, the patient was                         re-assessed for adequacy to receive sedatives. The                         heart rate, respiratory rate, oxygen saturations,                         blood pressure, adequacy of pulmonary ventilation, and                         response to care were monitored throughout the                         procedure. The physical status of the patient was                         re-assessed after the procedure.                        After obtaining informed consent, the endoscope was                         passed under direct vision. Throughout the procedure,                         the patient's blood pressure, pulse, and oxygen                         saturations were monitored continuously. The Endoscope  was introduced through the ileoanal anastomosis via                         the anus and advanced to the ileoanal pouch and into                         the neo-terminal ileum. The procedure was performed                         without difficulty. The patient tolerated the                         procedure well. The quality of the bowel preparation                         was adequate. Findings:      Patient is status-post IPAA with an end-to-side ileo-rectal anastomosis.      The digital rectal exam findings include rectal stricture- finger and       gastroscope traverse with ease. Pertinent negatives include no palpable       rectal lesions.      The  neo-terminal ileum appeared normal. Estimated blood loss: none.      Inflammation characterized by altered vascularity, congestion (edema),       erosions, erythema, granularity, loss of vascularity and aphthous       ulcerations was found in the rectal pouch. This was moderate in       severity. Biopsies were taken with a cold forceps for histology. Rule       out CMV and HSV. Estimated blood loss was minimal.      The retroflexed view of the distal rectum and anal verge was normal and       showed no anal or rectal abnormalities. Impression:            - Rectal stricture- finger and gastroscope traverse                         with ease found on digital rectal exam.                        - The examined portion of the ileum was normal.                        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 Biopsied.                        - The distal rectum and anal verge are normal on                         retroflexion view. Recommendation:        - Return patient to hospital ward for ongoing care.                        - Low residue diet.                        -  Recommend initiation of metronidazole and                         ciprofloxacin for pouchitis.                        - Await pathology results.                        - Refer to a gastroenterologist that you were                         previously following with- Dr. Alisia Ferrari.                        - The findings and recommendations were discussed with                         the patient.                        - The findings and recommendations were discussed with                         the referring physician. Procedure Code(s):     --- Professional ---                        [phone], Endoscopic evaluation of small intestinal pouch                         (eg, Kock pouch, ileal reservoir [S or J]); with                         biopsy, single or multiple Diagnosis Code(s):      --- Professional ---                        H63.149, Pouchitis                        K52.9, Noninfective gastroenteritis and colitis,                         unspecified CPT copyright 2019 American Medical Association. All rights reserved. The codes documented in this report are preliminary and upon coder review may  be revised to meet current compliance requirements. Attending Participation:      I personally performed the entire procedure. Volney American, DO Annamaria Helling DO, DO 04/10/2022 1:40:15 PM This report has been signed electronically. Number of Addenda: 0 Note Initiated On: 04/10/2022 10:39 AM Total Procedure Duration: 0 hours 11 minutes 49 seconds  Estimated Blood Loss:  Estimated blood loss was minimal.      [facility]

## 2022-04-10 NOTE — Care Plan (Addendum)
Recommend  Ciprofloxacin 523m q12h po and metronidazole 500 mg q12h for two weeks  SRonne Binning DO [facility]

## 2022-04-10 NOTE — Anesthesia Preprocedure Evaluation (Signed)
Anesthesia Evaluation  Patient identified by MRN, date of birth, ID band Patient awake    Reviewed: Allergy & Precautions, NPO status , Patient's Chart, lab work & pertinent test results  Airway Mallampati: III  TM Distance: >3 FB Neck ROM: full    Dental  (+) Chipped   Pulmonary sleep apnea ,  history of lung mass previously   Pulmonary exam normal        Cardiovascular Normal cardiovascular exam  Chronic venous insufficiency   Neuro/Psych PSYCHIATRIC DISORDERS Post traumatic stress disorder (PTSD)Periodic limb movement disorder (PLMD) negative neurological ROS     GI/Hepatic Neg liver ROS, GERD  Controlled,Status post intestinal bypass or anastomosis UC (ulcerative colitis)   Endo/Other  diabetes, Poorly Controlled, Type 2A1c 13.4  Renal/GU Renal InsufficiencyRenal diseaseacute kidney injury in the setting of hyponatremia and 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   Past Medical History: No date: Cancer ([place]) No date: Diabetes mellitus without complication (HCC)  Vocal fold scar Vocal process granuloma    Past Surgical History: No date: OTHER SURGICAL HISTORY     Comment:  J pouch 1997, history of ileostomy with reversal  BMI    Body Mass Index: 25.83 kg/m      Reproductive/Obstetrics negative OB ROS                             Anesthesia Physical Anesthesia Plan  ASA: 3  Anesthesia Plan: General   Post-op Pain Management:    Induction:   PONV Risk Score and Plan: Propofol infusion and TIVA  Airway Management Planned:   Additional Equipment:   Intra-op Plan:   Post-operative Plan:   Informed Consent:     Dental Advisory Given  Plan  Discussed with: Anesthesiologist, CRNA and Surgeon  Anesthesia Plan Comments:         Anesthesia Quick Evaluation

## 2022-04-11 ENCOUNTER — Encounter: Payer: Self-pay | Admitting: Gastroenterology

## 2022-04-11 DIAGNOSIS — N179 Acute kidney failure, unspecified: Secondary | ICD-10-CM | POA: Diagnosis not present

## 2022-04-11 DIAGNOSIS — N1831 Chronic kidney disease, stage 3a: Secondary | ICD-10-CM | POA: Diagnosis not present

## 2022-04-11 DIAGNOSIS — K91850 Pouchitis: Secondary | ICD-10-CM | POA: Diagnosis not present

## 2022-04-11 LAB — CBC WITH DIFFERENTIAL/PLATELET
Abs Immature Granulocytes: 0.03 10*3/uL (ref 0.00–0.07)
Basophils Absolute: 0 10*3/uL (ref 0.0–0.1)
Basophils Relative: 1 %
Eosinophils Absolute: 0.2 10*3/uL (ref 0.0–0.5)
Eosinophils Relative: 4 %
HCT: 34 % — ABNORMAL LOW (ref 39.0–52.0)
Hemoglobin: 11.7 g/dL — ABNORMAL LOW (ref 13.0–17.0)
Immature Granulocytes: 1 %
Lymphocytes Relative: 22 %
Lymphs Abs: 1.1 10*3/uL (ref 0.7–4.0)
MCH: 28.2 pg (ref 26.0–34.0)
MCHC: 34.4 g/dL (ref 30.0–36.0)
MCV: 81.9 fL (ref 80.0–100.0)
Monocytes Absolute: 0.2 10*3/uL (ref 0.1–1.0)
Monocytes Relative: 5 %
Neutro Abs: 3.3 10*3/uL (ref 1.7–7.7)
Neutrophils Relative %: 67 %
Platelets: 154 10*3/uL (ref 150–400)
RBC: 4.15 MIL/uL — ABNORMAL LOW (ref 4.22–5.81)
RDW: 13.6 % (ref 11.5–15.5)
WBC: 4.9 10*3/uL (ref 4.0–10.5)
nRBC: 0 % (ref 0.0–0.2)

## 2022-04-11 LAB — BASIC METABOLIC PANEL
Anion gap: 1 — ABNORMAL LOW (ref 5–15)
BUN: 16 mg/dL (ref 8–23)
CO2: 21 mmol/L — ABNORMAL LOW (ref 22–32)
Calcium: 8.4 mg/dL — ABNORMAL LOW (ref 8.9–10.3)
Chloride: 113 mmol/L — ABNORMAL HIGH (ref 98–111)
Creatinine, Ser: 1.3 mg/dL — ABNORMAL HIGH (ref 0.61–1.24)
GFR, Estimated: >60 mL/min (ref >60)
Glucose, Bld: 282 mg/dL — ABNORMAL HIGH (ref 70–99)
Potassium: 4.4 mmol/L (ref 3.5–5.1)
Sodium: 135 mmol/L (ref 135–145)

## 2022-04-11 LAB — GLUCOSE, CAPILLARY
Glucose-Capillary: 300 mg/dL — ABNORMAL HIGH (ref 70–99)
Glucose-Capillary: 337 mg/dL — ABNORMAL HIGH (ref 70–99)

## 2022-04-11 LAB — MAGNESIUM: Magnesium: 1.9 mg/dL (ref 1.7–2.4)

## 2022-04-11 MED ORDER — METRONIDAZOLE 500 MG PO TABS
500.0000 mg | ORAL_TABLET | Freq: Two times a day (BID) | ORAL | 0 refills | Status: AC
Start: 2022-04-11 — End: 2022-04-24

## 2022-04-11 MED ORDER — CIPROFLOXACIN HCL 500 MG PO TABS
500.0000 mg | ORAL_TABLET | Freq: Two times a day (BID) | ORAL | 0 refills | Status: AC
Start: 2022-04-11 — End: 2022-04-24

## 2022-04-11 MED ORDER — LOPERAMIDE HCL 2 MG PO CAPS
2.0000 mg | ORAL_CAPSULE | ORAL | Status: DC | PRN
Start: 2022-04-11 — End: 2022-04-11
  Administered 2022-04-11: 2 mg via ORAL
  Filled 2022-04-11: qty 1

## 2022-04-11 NOTE — Care Management Important Message (Deleted)
Important Message  Patient Details  Name: [name] MRN: [id number] Date of Birth: 16-Aug-1956   Medicare Important Message Given:  N/A - LOS <3 / Initial given by admissions     Juliann Pulse A Shawnese Magner 04/11/2022, 11:11 AM

## 2022-04-11 NOTE — Discharge Summary (Signed)
Physician Discharge Summary   [name] JKK:[id number] DOB: 1956-06-09 DOA: 04/07/2022  PCP: Theotis Burrow, MD  Admit date: 04/07/2022 Discharge date: 04/11/2022   Admitted From: Home Disposition:  Home Discharging physician: Dwyane Dee, MD  Recommendations for Outpatient Follow-up:  Follow up with GI Follow up biopsies from pouchoscopy and H pylori  Repeat BMP in 1-2 weeks  Home Health:  Equipment/Devices:   Discharge Condition: stable CODE STATUS: Full Diet recommendation:  Diet Orders (From admission, onward)     Start     Ordered   04/10/22 1342  DIET SOFT Room service appropriate? Yes; Fluid consistency: Thin  Diet effective now       Question Answer Comment  Room service appropriate? Yes   Fluid consistency: Thin      04/10/22 1341            Hospital Course: Mr. Tonne is a 66 y.o. 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  Assessment and Plan:  Acute renal failure superimposed on stage 3a chronic kidney disease (HCC) Baseline creatinine 1.2-1.3.  creatinine is 1.89, BUN 31, GFR 38.  Likely due to dehydration secondary to diarrhea. -Creatinine peaked at 1.89 and has been improving slowly -Nephrology followed - creat 1.3 at discharge and back to baseline   Pouchitis - underwent pouchoscopy on 04/10/2022 - Findings noted to show moderate in severity erosions, erythema, granularity, loss of vascularity, and aphthous ulcers in the rectal pouch - Follow-up pouchoscopy biopsies and H pylori - Continue Cipro and Flagyl per GI recommendations - Outpatient follow-up with Dr. Alisia Ferrari   Hyponatremia, Complicated by confusion Resolving - likely due to GI losses Urine sodium low, low osm, ongoing GI  losses established   Diarrhea Etiology is not clear.  Currently patient does not have abdominal pain.  No fever or leukocytosis.  C diff test negative.  GI pathogen panel negative. -Imodium PRN   UC (ulcerative colitis) ([place]) -s/p of colectomy and J-pouch   Type II diabetes mellitus with renal manifestations ([place]) -Continue home regimen   Adjustment disorder with anxiety Adjustment disorder with anxiety, panic disorder -continue home regimen    Panic attack -see abvoe   Essential hypertension -Continue current regimen   The patient's chronic medical conditions were treated accordingly per the patient's home medication regimen except as noted.  On day of discharge, patient was felt deemed stable for discharge. Patient/family member advised to call PCP or come back to ER if needed.   Principal Diagnosis: Acute renal failure superimposed on stage 3a chronic kidney disease [facility])  Discharge Diagnoses: Active Hospital Problems   Diagnosis Date Noted   Acute renal failure superimposed on stage 3a chronic kidney disease ([place]) 04/07/2022    Priority: High   Hyponatremia 12/05/2019    Priority: 2.   Diarrhea 12/05/2019    Priority: 3.   UC (ulcerative colitis) ([place]) 08/18/2013    Priority: 4.   Type II diabetes mellitus with renal manifestations ([place]) 04/07/2022    Priority: 5.   Adjustment disorder with anxiety 12/01/2020    Priority: 6.   Panic attack 08/24/2015    Priority: 7.   Essential hypertension 09/04/2016    Priority: 9.   Pouchitis ([place]) 09/01/2014    Resolved Hospital Problems  No resolved problems to display.     Discharge Instructions  Increase activity slowly   Complete by: As directed       Allergies as of 04/11/2022       Reactions   Sulfa Antibiotics Rash, Hives   Oxycodone Other (See Comments), Rash   Rash and headache        Medication List     TAKE these medications    ciprofloxacin 500 MG tablet Commonly known as: CIPRO Take 1  tablet (500 mg total) by mouth 2 (two) times daily for 13 days.   Jardiance 10 MG Tabs tablet Generic drug: empagliflozin Take 10 mg by mouth daily.   Lantus SoloStar 100 UNIT/ML Solostar Pen Generic drug: insulin glargine Inject 35 Units into the skin at bedtime.   LORazepam 0.5 MG tablet Commonly known as: Ativan Take 1 tablet (0.5 mg total) by mouth 2 (two) times daily.   metFORMIN 1000 MG tablet Commonly known as: GLUCOPHAGE Take 1,000 mg by mouth 2 (two) times daily.   metroNIDAZOLE 500 MG tablet Commonly known as: FLAGYL Take 1 tablet (500 mg total) by mouth every 12 (twelve) hours for 13 days.   mupirocin ointment 2 % Commonly known as: BACTROBAN Apply 1 Application topically daily.   OLANZapine 5 MG tablet Commonly known as: ZyPREXA Take 1.5 tablets (7.5 mg total) by mouth at bedtime.   omeprazole 40 MG capsule Commonly known as: PRILOSEC Take 40 mg by mouth daily.   venlafaxine XR 150 MG 24 hr capsule Commonly known as: EFFEXOR-XR Take 150 mg by mouth daily.        Allergies  Allergen Reactions   Sulfa Antibiotics Rash and Hives   Oxycodone Other (See Comments) and Rash    Rash and headache    Consultations: GI Nephrology  Procedures: Pouchoscopy, 04/10/22  Discharge Exam: BP 125/85 (BP Location: Left Arm)   Pulse 88   Temp 98 F (36.7 C)   Resp 18   Ht 5' 10"  (1.778 m)   Wt 81.6 kg   SpO2 100%   BMI 25.83 kg/m  Physical Exam Constitutional:      General: He is not in acute distress.    Appearance: Normal appearance.  HENT:     Head: Normocephalic and atraumatic.     Mouth/Throat:     Mouth: Mucous membranes are moist.  Eyes:     Extraocular Movements: Extraocular movements intact.  Cardiovascular:     Rate and Rhythm: Normal rate and regular rhythm.     Heart sounds: Normal heart sounds.  Pulmonary:     Effort: Pulmonary effort is normal. No respiratory distress.     Breath sounds: Normal breath sounds. No wheezing.   Abdominal:     General: Bowel sounds are normal. There is no distension.     Palpations: Abdomen is soft.     Tenderness: There is no abdominal tenderness.  Musculoskeletal:        General: Normal range of motion.     Cervical back: Normal range of motion and neck supple.  Skin:    General: Skin is warm and dry.  Neurological:     General: No focal deficit present.     Mental Status: He is alert.  Psychiatric:        Mood and Affect: Mood normal.        Behavior: Behavior normal.      The results of significant diagnostics from this hospitalization (including imaging, microbiology, ancillary and laboratory) are listed below for reference.   Microbiology: Recent Results (from the past 240  hour(s))  C Difficile Quick Screen w PCR reflex     Status: None   Collection Time: 04/07/22  9:24 AM   Specimen: STOOL  Result Value Ref Range Status   C Diff antigen NEGATIVE NEGATIVE Final   C Diff toxin NEGATIVE NEGATIVE Final   C Diff interpretation No C. difficile detected.  Final    Comment: Performed at [facility], [address] [place], [place] [address]  Gastrointestinal Panel by PCR , Stool     Status: None   Collection Time: 04/07/22  9:24 AM   Specimen: Stool  Result Value Ref Range Status   Campylobacter species NOT DETECTED NOT DETECTED Final   Plesimonas shigelloides NOT DETECTED NOT DETECTED Final   Salmonella species NOT DETECTED NOT DETECTED Final   Yersinia enterocolitica NOT DETECTED NOT DETECTED Final   Vibrio species NOT DETECTED NOT DETECTED Final   Vibrio cholerae NOT DETECTED NOT DETECTED Final   Enteroaggregative E coli (EAEC) NOT DETECTED NOT DETECTED Final   Enteropathogenic E coli (EPEC) NOT DETECTED NOT DETECTED Final   Enterotoxigenic E coli (ETEC) NOT DETECTED NOT DETECTED Final   Shiga like toxin producing E coli (STEC) NOT DETECTED NOT DETECTED Final   Shigella/Enteroinvasive E coli (EIEC) NOT DETECTED NOT DETECTED Final   Cryptosporidium  NOT DETECTED NOT DETECTED Final   Cyclospora cayetanensis NOT DETECTED NOT DETECTED Final   Entamoeba histolytica NOT DETECTED NOT DETECTED Final   Giardia lamblia NOT DETECTED NOT DETECTED Final   Adenovirus F40/41 NOT DETECTED NOT DETECTED Final   Astrovirus NOT DETECTED NOT DETECTED Final   Norovirus GI/GII NOT DETECTED NOT DETECTED Final   Rotavirus A NOT DETECTED NOT DETECTED Final   Sapovirus (I, II, IV, and V) NOT DETECTED NOT DETECTED Final    Comment: Performed at [facility], [address] [place], [place] [address]     Labs: BNP (last 3 results) No results for input(s): "BNP" in the last 8760 hours. Basic Metabolic Panel: Recent Labs  Lab 04/08/22 0045 04/08/22 0852 04/09/22 0803 04/10/22 1014 04/11/22 0534  NA 136 132* 135 134* 135  K 4.6 3.7 4.1 3.4* 4.4  CL 107 101 107 108 113*  CO2 26 23 23  19* 21*  GLUCOSE 278* 311* 251* 128* 282*  BUN 29* 26* 21 16 16   CREATININE 1.79* 1.57* 1.47* 1.34* 1.30*  CALCIUM 8.6* 8.5* 8.9 8.5* 8.4*  MG  --   --   --  2.0 1.9   Liver Function Tests: Recent Labs  Lab 04/07/22 0037  AST 34  ALT 36  ALKPHOS 316*  BILITOT 0.9  PROT 7.4  ALBUMIN 4.1   Recent Labs  Lab 04/07/22 0037  LIPASE 44   No results for input(s): "AMMONIA" in the last 168 hours. CBC: Recent Labs  Lab 04/07/22 0037 04/10/22 1014 04/11/22 0534  WBC 7.4 7.2 4.9  NEUTROABS  --  5.2 3.3  HGB 12.9* 13.1 11.7*  HCT 36.2* 38.3* 34.0*  MCV 79.0* 82.0 81.9  PLT 188 168 154   Cardiac Enzymes: No results for input(s): "CKTOTAL", "CKMB", "CKMBINDEX", "TROPONINI" in the last 168 hours. BNP: Invalid input(s): "POCBNP" CBG: Recent Labs  Lab 04/10/22 1454 04/10/22 1636 04/10/22 2032 04/11/22 0820 04/11/22 1120  GLUCAP 168* 160* 253* 300* 337*   D-Dimer No results for input(s): "DDIMER" in the last 72 hours. Hgb A1c No results for input(s): "HGBA1C" in the last 72 hours. Lipid Profile No results for input(s): "CHOL", "HDL",  "LDLCALC", "TRIG", "CHOLHDL", "LDLDIRECT"  in the last 72 hours. Thyroid function studies No results for input(s): "TSH", "T4TOTAL", "T3FREE", "THYROIDAB" in the last 72 hours.  Invalid input(s): "FREET3" Anemia work up No results for input(s): "VITAMINB12", "FOLATE", "FERRITIN", "TIBC", "IRON", "RETICCTPCT" in the last 72 hours. Urinalysis    Component Value Date/Time   COLORURINE COLORLESS (A) 04/07/2022 0036   APPEARANCEUR CLEAR (A) 04/07/2022 0036   APPEARANCEUR Clear 10/22/2014 1107   LABSPEC 1.012 04/07/2022 0036   LABSPEC 1.015 10/22/2014 1107   PHURINE 6.0 04/07/2022 0036   GLUCOSEU >=500 (A) 04/07/2022 0036   GLUCOSEU Negative 10/22/2014 1107   HGBUR NEGATIVE 04/07/2022 0036   BILIRUBINUR NEGATIVE 04/07/2022 0036   BILIRUBINUR Negative 10/22/2014 1107   KETONESUR NEGATIVE 04/07/2022 0036   PROTEINUR NEGATIVE 04/07/2022 0036   NITRITE NEGATIVE 04/07/2022 0036   LEUKOCYTESUR NEGATIVE 04/07/2022 0036   LEUKOCYTESUR Negative 10/22/2014 1107   Sepsis Labs Recent Labs  Lab 04/07/22 0037 04/10/22 1014 04/11/22 0534  WBC 7.4 7.2 4.9   Microbiology Recent Results (from the past 240 hour(s))  C Difficile Quick Screen w PCR reflex     Status: None   Collection Time: 04/07/22  9:24 AM   Specimen: STOOL  Result Value Ref Range Status   C Diff antigen NEGATIVE NEGATIVE Final   C Diff toxin NEGATIVE NEGATIVE Final   C Diff interpretation No C. difficile detected.  Final    Comment: Performed at [facility], [address] [place], [place] [address]  Gastrointestinal Panel by PCR , Stool     Status: None   Collection Time: 04/07/22  9:24 AM   Specimen: Stool  Result Value Ref Range Status   Campylobacter species NOT DETECTED NOT DETECTED Final   Plesimonas shigelloides NOT DETECTED NOT DETECTED Final   Salmonella species NOT DETECTED NOT DETECTED Final   Yersinia enterocolitica NOT DETECTED NOT DETECTED Final   Vibrio species NOT DETECTED NOT DETECTED Final    Vibrio cholerae NOT DETECTED NOT DETECTED Final   Enteroaggregative E coli (EAEC) NOT DETECTED NOT DETECTED Final   Enteropathogenic E coli (EPEC) NOT DETECTED NOT DETECTED Final   Enterotoxigenic E coli (ETEC) NOT DETECTED NOT DETECTED Final   Shiga like toxin producing E coli (STEC) NOT DETECTED NOT DETECTED Final   Shigella/Enteroinvasive E coli (EIEC) NOT DETECTED NOT DETECTED Final   Cryptosporidium NOT DETECTED NOT DETECTED Final   Cyclospora cayetanensis NOT DETECTED NOT DETECTED Final   Entamoeba histolytica NOT DETECTED NOT DETECTED Final   Giardia lamblia NOT DETECTED NOT DETECTED Final   Adenovirus F40/41 NOT DETECTED NOT DETECTED Final   Astrovirus NOT DETECTED NOT DETECTED Final   Norovirus GI/GII NOT DETECTED NOT DETECTED Final   Rotavirus A NOT DETECTED NOT DETECTED Final   Sapovirus (I, II, IV, and V) NOT DETECTED NOT DETECTED Final    Comment: Performed at [facility], [address] [place], [place] [address]    Procedures/Studies: CT ABDOMEN PELVIS WO CONTRAST  Result Date: 04/09/2022 CLINICAL DATA:  Acute renal insufficiency, diarrhea EXAM: CT ABDOMEN AND PELVIS WITHOUT CONTRAST TECHNIQUE: Multidetector CT imaging of the abdomen and pelvis was performed following the standard protocol without IV contrast. Unenhanced CT was performed per clinician order. Lack of IV contrast limits sensitivity and specificity, especially for evaluation of abdominal/pelvic solid viscera. RADIATION DOSE REDUCTION: This exam was performed according to the departmental dose-optimization program which includes automated exposure control, adjustment of the mA and/or kV according to patient size and/or use of iterative reconstruction technique. COMPARISON:  12/14/2021,  04/08/2022 FINDINGS: Lower chest: No acute pleural or parenchymal lung disease. Hepatobiliary: Calcified gallstone without evidence of acute cholecystitis. Unremarkable unenhanced appearance of the liver. No biliary duct  dilation. Pancreas: Unremarkable unenhanced appearance. Spleen: Unremarkable unenhanced appearance. Adrenals/Urinary Tract: No evidence of urinary tract calculi or obstructive uropathy within either kidney. The mild left hydronephrosis seen on previous ultrasound has resolved in the interim. The adrenals are unremarkable. Bladder is decompressed, with nonspecific bladder wall thickening. Stomach/Bowel: Postsurgical changes are seen from prior partial colectomy and reanastomosis. There gas fluid levels within the residual rectosigmoid colon consistent with given history of diarrhea. No evidence of bowel obstruction or ileus. No bowel wall thickening or inflammatory change. Vascular/Lymphatic: Aortic atherosclerosis. No enlarged abdominal or pelvic lymph nodes. Reproductive: Prostate is unremarkable. Other: No free fluid or free intraperitoneal gas. No abdominal wall hernia. Musculoskeletal: No acute or destructive bony lesions. Reconstructed images demonstrate no additional findings. IMPRESSION: 1. Interval resolution of the left-sided hydronephrosis seen on recent ultrasound. No urinary tract calculi or obstructive uropathy on this exam. 2. Colonic gas fluid levels consistent with history of diarrhea. No bowel obstruction or ileus. 3. Decompressed urinary bladder, which limits evaluation. Nonspecific bladder wall thickening felt to be due to decompressed state. 4. Cholelithiasis without cholecystitis. 5.  Aortic Atherosclerosis (ICD10-I70.0). Electronically Signed   By: Randa Ngo M.D.   On: 04/09/2022 15:22   US RENAL  Result Date: 04/08/2022 CLINICAL DATA:  Acute renal insufficiency EXAM: RENAL / URINARY TRACT ULTRASOUND COMPLETE COMPARISON:  CT scan of the abdomen and pelvis December 14, 2021 FINDINGS: Right Kidney: Renal measurements: 9.5 x 4.3 x 3.8 cm = volume: 82 mL. Increased cortical echogenicity. Left Kidney: Renal measurements: 10.1 x 5.9 x 5.3 cm = volume: 167 mL. Mild hydronephrosis. Increased  cortical echogenicity. Bladder: Appears normal for degree of bladder distention. Other: None. IMPRESSION: 1. Mild left-sided hydronephrosis, new since December 14, 2021. CT imaging could better look for an underlying cause. 2. Medical renal disease with increased cortical echogenicity. Electronically Signed   By: Dorise Bullion III M.D.   On: 04/08/2022 12:05     Time coordinating discharge: Over 30 minutes    Dwyane Dee, MD  [facility] 04/11/2022, 12:41 PM

## 2022-04-11 NOTE — Progress Notes (Addendum)
[facility]  ROUNDING NOTE   Subjective:   Patient seen resting quietly Easily aroused States he feels better, appetite improving Remains on room air with no lower extremity edema  Creatinine 1.30  Objective:  Vital signs in last 24 hours:  Temp:  [96 F (35.6 C)-98.1 F (36.7 C)] 98 F (36.7 C) (07/19 0806) Pulse Rate:  [68-95] 88 (07/19 0806) Resp:  [16-20] 18 (07/19 0806) BP: (104-128)/(66-85) 125/85 (07/19 0806) SpO2:  [97 %-100 %] 100 % (07/19 0806)  Weight change:  Filed Weights   04/07/22 0027  Weight: 81.6 kg    Intake/Output: I/O last 3 completed shifts: In: 95.6 [I.V.:95.6] Out: -    Intake/Output this shift:  Total I/O In: 240 [P.O.:240] Out: -   Physical Exam: General: NAD  Head: Normocephalic, atraumatic. Moist oral mucosal membranes  Eyes: Anicteric  Lungs:  Clear to auscultation, normal effort  Heart: Regular rate and rhythm  Abdomen:  Soft, nontender  Extremities:  no peripheral edema.  Neurologic: Nonfocal, moving all four extremities  Skin: No lesions  Access: None    Basic Metabolic Panel: Recent Labs  Lab 04/08/22 0045 04/08/22 0852 04/09/22 0803 04/10/22 1014 04/11/22 0534  NA 136 132* 135 134* 135  K 4.6 3.7 4.1 3.4* 4.4  CL 107 101 107 108 113*  CO2 26 23 23  19* 21*  GLUCOSE 278* 311* 251* 128* 282*  BUN 29* 26* 21 16 16   CREATININE 1.79* 1.57* 1.47* 1.34* 1.30*  CALCIUM 8.6* 8.5* 8.9 8.5* 8.4*  MG  --   --   --  2.0 1.9     Liver Function Tests: Recent Labs  Lab 04/07/22 0037  AST 34  ALT 36  ALKPHOS 316*  BILITOT 0.9  PROT 7.4  ALBUMIN 4.1    Recent Labs  Lab 04/07/22 0037  LIPASE 44    No results for input(s): "AMMONIA" in the last 168 hours.  CBC: Recent Labs  Lab 04/07/22 0037 04/10/22 1014 04/11/22 0534  WBC 7.4 7.2 4.9  NEUTROABS  --  5.2 3.3  HGB 12.9* 13.1 11.7*  HCT 36.2* 38.3* 34.0*  MCV 79.0* 82.0 81.9  PLT 188 168 154     Cardiac Enzymes: No results for  input(s): "CKTOTAL", "CKMB", "CKMBINDEX", "TROPONINI" in the last 168 hours.  BNP: Invalid input(s): "POCBNP"  CBG: Recent Labs  Lab 04/10/22 1454 04/10/22 1636 04/10/22 2032 04/11/22 0820 04/11/22 1120  GLUCAP 168* 160* 253* 300* 40*     Microbiology: Results for orders placed or performed during the hospital encounter of 04/07/22  C Difficile Quick Screen w PCR reflex     Status: None   Collection Time: 04/07/22  9:24 AM   Specimen: STOOL  Result Value Ref Range Status   C Diff antigen NEGATIVE NEGATIVE Final   C Diff toxin NEGATIVE NEGATIVE Final   C Diff interpretation No C. difficile detected.  Final    Comment: Performed at [facility], [address] [place], [place] [address]  Gastrointestinal Panel by PCR , Stool     Status: None   Collection Time: 04/07/22  9:24 AM   Specimen: Stool  Result Value Ref Range Status   Campylobacter species NOT DETECTED NOT DETECTED Final   Plesimonas shigelloides NOT DETECTED NOT DETECTED Final   Salmonella species NOT DETECTED NOT DETECTED Final   Yersinia enterocolitica NOT DETECTED NOT DETECTED Final   Vibrio species NOT DETECTED NOT DETECTED Final   Vibrio cholerae NOT DETECTED NOT DETECTED Final  Enteroaggregative E coli (EAEC) NOT DETECTED NOT DETECTED Final   Enteropathogenic E coli (EPEC) NOT DETECTED NOT DETECTED Final   Enterotoxigenic E coli (ETEC) NOT DETECTED NOT DETECTED Final   Shiga like toxin producing E coli (STEC) NOT DETECTED NOT DETECTED Final   Shigella/Enteroinvasive E coli (EIEC) NOT DETECTED NOT DETECTED Final   Cryptosporidium NOT DETECTED NOT DETECTED Final   Cyclospora cayetanensis NOT DETECTED NOT DETECTED Final   Entamoeba histolytica NOT DETECTED NOT DETECTED Final   Giardia lamblia NOT DETECTED NOT DETECTED Final   Adenovirus F40/41 NOT DETECTED NOT DETECTED Final   Astrovirus NOT DETECTED NOT DETECTED Final   Norovirus GI/GII NOT DETECTED NOT DETECTED Final   Rotavirus A NOT  DETECTED NOT DETECTED Final   Sapovirus (I, II, IV, and V) NOT DETECTED NOT DETECTED Final    Comment: Performed at [facility], [address] [place], [place] [address]    Coagulation Studies: No results for input(s): "LABPROT", "INR" in the last 72 hours.  Urinalysis: No results for input(s): "COLORURINE", "LABSPEC", "PHURINE", "GLUCOSEU", "HGBUR", "BILIRUBINUR", "KETONESUR", "PROTEINUR", "UROBILINOGEN", "NITRITE", "LEUKOCYTESUR" in the last 72 hours.  Invalid input(s): "APPERANCEUR"    Imaging: CT ABDOMEN PELVIS WO CONTRAST  Result Date: 04/09/2022 CLINICAL DATA:  Acute renal insufficiency, diarrhea EXAM: CT ABDOMEN AND PELVIS WITHOUT CONTRAST TECHNIQUE: Multidetector CT imaging of the abdomen and pelvis was performed following the standard protocol without IV contrast. Unenhanced CT was performed per clinician order. Lack of IV contrast limits sensitivity and specificity, especially for evaluation of abdominal/pelvic solid viscera. RADIATION DOSE REDUCTION: This exam was performed according to the departmental dose-optimization program which includes automated exposure control, adjustment of the mA and/or kV according to patient size and/or use of iterative reconstruction technique. COMPARISON:  12/14/2021, 04/08/2022 FINDINGS: Lower chest: No acute pleural or parenchymal lung disease. Hepatobiliary: Calcified gallstone without evidence of acute cholecystitis. Unremarkable unenhanced appearance of the liver. No biliary duct dilation. Pancreas: Unremarkable unenhanced appearance. Spleen: Unremarkable unenhanced appearance. Adrenals/Urinary Tract: No evidence of urinary tract calculi or obstructive uropathy within either kidney. The mild left hydronephrosis seen on previous ultrasound has resolved in the interim. The adrenals are unremarkable. Bladder is decompressed, with nonspecific bladder wall thickening. Stomach/Bowel: Postsurgical changes are seen from prior partial colectomy  and reanastomosis. There gas fluid levels within the residual rectosigmoid colon consistent with given history of diarrhea. No evidence of bowel obstruction or ileus. No bowel wall thickening or inflammatory change. Vascular/Lymphatic: Aortic atherosclerosis. No enlarged abdominal or pelvic lymph nodes. Reproductive: Prostate is unremarkable. Other: No free fluid or free intraperitoneal gas. No abdominal wall hernia. Musculoskeletal: No acute or destructive bony lesions. Reconstructed images demonstrate no additional findings. IMPRESSION: 1. Interval resolution of the left-sided hydronephrosis seen on recent ultrasound. No urinary tract calculi or obstructive uropathy on this exam. 2. Colonic gas fluid levels consistent with history of diarrhea. No bowel obstruction or ileus. 3. Decompressed urinary bladder, which limits evaluation. Nonspecific bladder wall thickening felt to be due to decompressed state. 4. Cholelithiasis without cholecystitis. 5.  Aortic Atherosclerosis (ICD10-I70.0). Electronically Signed   By: Randa Ngo M.D.   On: 04/09/2022 15:22     Medications:     ciprofloxacin  500 mg Oral BID   enoxaparin (LOVENOX) injection  40 mg Subcutaneous Q24H   insulin aspart  0-5 Units Subcutaneous QHS   insulin aspart  0-9 Units Subcutaneous TID WC   insulin aspart  8 Units Subcutaneous TID WC   insulin glargine-yfgn  40 Units Subcutaneous Daily  LORazepam  0.5 mg Oral TID   metroNIDAZOLE  500 mg Oral Q12H   mupirocin ointment  1 Application Topical Daily   OLANZapine  7.5 mg Oral QHS   pantoprazole  40 mg Oral Daily   venlafaxine XR  150 mg Oral Daily   acetaminophen **OR** acetaminophen, fentaNYL (SUBLIMAZE) injection, hydrALAZINE, loperamide, melatonin, ondansetron **OR** ondansetron (ZOFRAN) IV, traZODone  Assessment/ Plan:  [name] is a 66 y.o.  male with past medical history including PTSD, diabetes, UC, diffuse large B cell lymphoma, GERD, hypertension, and chronic  kidney disease stage II,, who was admitted to [facility] on 04/07/2022 for Hyperglycemia [R73.9] AKI (acute kidney injury) ([place]) [N17.9] Diarrhea, unspecified type [R19.7]    Acute kidney injury with chronic kidney disease stage II. Baseline creatinine 1.26 an GFR 63 on 01/04/22. Acute kidney injury likely secondary to hypovolemia. No IV contrast exposure. Jardiance and metformin held.  No acute indication for dialysis at this time. Renal function elevated on admission.  Creatinine continues to decrease and patient considered back to baseline.  Patient cleared to discharge from renal stance.  Lab Results  Component Value Date   CREATININE 1.30 (H) 04/11/2022   CREATININE 1.34 (H) 04/10/2022   CREATININE 1.47 (H) 04/09/2022    Intake/Output Summary (Last 24 hours) at 04/11/2022 1211 Last data filed at 04/11/2022 1053 Gross per 24 hour  Intake 335.64 ml  Output --  Net 335.64 ml    2. Hyponatremia possibly due to acute kidney injury or Right upper lobe mass seen on CT imaging in Jan 2022. Patient has had no follow up for this finding.   Sodium recovered to 135.   LOS: 4 [place] 7/19/202312:11 PM

## 2022-04-11 NOTE — Care Management (Signed)
  Transition of Care [facility]) Screening Note   Patient Details  Name: [name] Date of Birth: December 04, 1955   Transition of Care [facility]) CM/SW Contact:    Pete Pelt, RN Phone Number: 04/11/2022, 11:56 AM    Transition of Care Department [facility]) has reviewed patient and no TOC needs have been identified at this time. We will continue to monitor patient advancement through interdisciplinary progression rounds. If new patient transition needs arise, please place a TOC consult.

## 2022-04-11 NOTE — Care Management Important Message (Signed)
Important Message  Patient Details  Name: [name] MRN: [id number] Date of Birth: [birth year]   Medicare Important Message Given:  Yes     Juliann Pulse A Darbi Chandran 04/11/2022, 11:11 AM

## 2022-04-12 LAB — SURGICAL PATHOLOGY

## 2022-05-10 ENCOUNTER — Ambulatory Visit: Admit: 2022-05-10 | Discharge: 2022-05-11 | Payer: MEDICARE

## 2022-05-11 ENCOUNTER — Other Ambulatory Visit: Payer: Self-pay

## 2022-05-11 DIAGNOSIS — I129 Hypertensive chronic kidney disease with stage 1 through stage 4 chronic kidney disease, or unspecified chronic kidney disease: Secondary | ICD-10-CM | POA: Diagnosis not present

## 2022-05-11 DIAGNOSIS — Z794 Long term (current) use of insulin: Secondary | ICD-10-CM | POA: Diagnosis not present

## 2022-05-11 DIAGNOSIS — E1122 Type 2 diabetes mellitus with diabetic chronic kidney disease: Secondary | ICD-10-CM | POA: Diagnosis not present

## 2022-05-11 DIAGNOSIS — Z859 Personal history of malignant neoplasm, unspecified: Secondary | ICD-10-CM | POA: Insufficient documentation

## 2022-05-11 DIAGNOSIS — Z7984 Long term (current) use of oral hypoglycemic drugs: Secondary | ICD-10-CM | POA: Insufficient documentation

## 2022-05-11 DIAGNOSIS — E1165 Type 2 diabetes mellitus with hyperglycemia: Secondary | ICD-10-CM | POA: Insufficient documentation

## 2022-05-11 DIAGNOSIS — N1831 Chronic kidney disease, stage 3a: Secondary | ICD-10-CM | POA: Insufficient documentation

## 2022-05-11 DIAGNOSIS — H6121 Impacted cerumen, right ear: Secondary | ICD-10-CM | POA: Insufficient documentation

## 2022-05-11 DIAGNOSIS — H9201 Otalgia, right ear: Secondary | ICD-10-CM | POA: Diagnosis present

## 2022-05-11 NOTE — ED Triage Notes (Signed)
Pt states right ear pain with sensation of clogged right ear for several hours. Pt appears in no acute distress.

## 2022-05-12 ENCOUNTER — Emergency Department
Admission: EM | Admit: 2022-05-12 | Discharge: 2022-05-12 | Disposition: A | Discharge status: Home / Self Care | Payer: Medicare Other | Attending: Emergency Medicine | Admitting: Emergency Medicine

## 2022-05-12 DIAGNOSIS — H6121 Impacted cerumen, right ear: Secondary | ICD-10-CM

## 2022-05-12 LAB — CBG MONITORING, ED: Glucose-Capillary: 294 mg/dL — ABNORMAL HIGH (ref 70–99)

## 2022-05-12 NOTE — Discharge Instructions (Signed)
I recommend you use a product called swim ear which will dry out your ear after getting water in it.  To remove earwax, I recommend Debrox eardrops.  Return to the ER for worsening symptoms, persistent vomiting, dizziness or other concerns.

## 2022-05-12 NOTE — ED Notes (Signed)
Flushed bilateral ears with of NS/hydrogen peroxide solution. Minimal cerumen obtained from this procedure. Impactions still visualized with inspection. MD notified

## 2022-05-12 NOTE — ED Notes (Signed)
Pt updated on wait. Pt ambulatory around lobby.

## 2022-05-12 NOTE — ED Provider Notes (Signed)
[facility] Provider Note    Event Date/Time   First MD Initiated Contact with Patient 05/12/22 [phone]     (approximate)   History   Ear Pain   HPI  Erickson Yamashiro is a 66 y.o. male who presents to the ED from home with a chief complaint of right ear sensation of being clogged for several hours.  Patient states he got water in his ear after showering.  Ear feels clogged.  Denies discharge, nausea/vomiting or dizziness.     Past Medical History   Past Medical History:  Diagnosis Date   Cancer ([place])    Diabetes mellitus without complication [facility])      Active Problem List   Patient Active Problem List   Diagnosis Date Noted   Acute renal failure superimposed on stage 3a chronic kidney disease ([place]) 04/07/2022   Type II diabetes mellitus with renal manifestations ([place]) 04/07/2022   Insomnia 12/01/2020   Adjustment disorder with anxiety 12/01/2020   Chronic venous insufficiency 05/10/2020   Hyponatremia 12/05/2019   Diarrhea 12/05/2019   Shingles 12/05/2019   Dehydration 12/04/2019   Post traumatic stress disorder (PTSD) 10/07/2018   Right knee pain 05/27/2018   Hypoglycemia 05/27/2017   TBI (traumatic brain injury) ([place]) 04/02/2017   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   Essential hypertension 09/04/2016   Uncontrolled type 2 diabetes mellitus 08/08/2016   Onychomycosis 09/12/2015   Panic attack 08/24/2015   Pouchitis ([place]) 09/01/2014   Periodic limb movement disorder (PLMD) 10/13/2013   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   Vocal process granuloma 10/06/2013   UC (ulcerative colitis) ([place]) 08/18/2013   Obstructive sleep apnea 01/29/2012   Posterior subcapsular polar senile cataract 12/31/2011   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 Authorizing Provider  JARDIANCE 10 MG TABS tablet Take 10 mg by mouth daily. 03/28/22   [provider]  LANTUS SOLOSTAR 100 UNIT/ML Solostar Pen Inject 35 Units into the skin at bedtime. 09/29/19   [provider]  metFORMIN (GLUCOPHAGE) 1000 MG tablet Take 1,000 mg by mouth 2 (two) times daily. 04/08/17   [provider]  mupirocin ointment (BACTROBAN) 2 % Apply 1 Application topically daily.    [provider]  OLANZapine (ZYPREXA) 5 MG tablet Take 1.5 tablets (7.5 mg total) by mouth at bedtime. 03/16/22 04/15/22  Naaman Plummer, MD  omeprazole (PRILOSEC) 40 MG capsule Take 40 mg by mouth daily.     [provider]  venlafaxine XR (EFFEXOR-XR) 150 MG 24 hr capsule Take 150 mg by mouth daily. 03/20/17   [provider]     Allergies  Sulfa antibiotics and Oxycodone   Family History   Family History  Problem Relation Age of Onset   Colon cancer Sister    Crohn's disease Brother      Physical Exam  Triage Vital Signs: ED Triage Vitals  Enc Vitals Group     BP 05/11/22 2306 138/73     Pulse Rate 05/11/22 2306 97     Resp 05/11/22 2306 16  Temp 05/11/22 2306 98 F (36.7 C)     Temp Source 05/11/22 2306 Oral     SpO2 05/11/22 2306 100 %     Weight 05/11/22 2307 172 lb (78 kg)     Height 05/11/22 2306 5' 10"  (1.778 m)     Head Circumference --      Peak Flow --      Pain Score 05/11/22 2306 6     Pain Loc --      Pain Edu? --      Excl. in [place]? --     Updated Vital Signs: BP 132/78   Pulse 88   Temp 98.4 F (36.9 C) (Oral)   Resp 18   Ht 5' 10"  (1.778 m)   Wt 78 kg   SpO2 100%   BMI 24.68 kg/m    General: Awake, no distress.  CV:  Good peripheral perfusion.  Resp:  Normal  effort.  Abd:  No distention.  Other:  Slight earwax in left ear; TM unremarkable.  Cerumen impaction right ear.   ED Results / Procedures / Treatments  Labs (all labs ordered are listed, but only abnormal results are displayed) Labs Reviewed  CBG MONITORING, ED - Abnormal; Notable for the following components:      Result Value   Glucose-Capillary 294 (*)    All other components within normal limits     EKG  None   RADIOLOGY None   Official radiology report(s): No results found.   PROCEDURES:  Critical Care performed: No  Procedures   MEDICATIONS ORDERED IN ED: Medications - No data to display   IMPRESSION / MDM / [place] / ED COURSE  I reviewed the triage vital signs and the nursing notes.                             66 year old male presenting with clogged right ear; cerumen impaction noted.  Will irrigate with hydrogen peroxide + NS and reassess.   Patient's presentation is most consistent with acute, uncomplicated illness.   Clinical Course as of 05/12/22 0654  Sat May 12, 2022  0651 Able to scoop cerumen from right ear after irrigation.  TM able to be seen which is not perforated.  Will discharge home with follow-up with ENT as needed.  Strict return precautions given.  Patient verbalizes understanding agrees with plan of care. [JS]    Clinical Course User Index [JS] Paulette Blanch, MD     FINAL CLINICAL IMPRESSION(S) / ED DIAGNOSES   Final diagnoses:  Impacted cerumen of right ear     Rx / DC Orders   ED Discharge Orders     None        Note:  This document was prepared using Dragon voice recognition software and may include unintentional dictation errors.   Paulette Blanch, MD 05/12/22 [phone]

## 2022-07-26 ENCOUNTER — Emergency Department: Payer: Medicare Other

## 2022-07-26 ENCOUNTER — Observation Stay
Admission: EM | Admit: 2022-07-26 | Discharge: 2022-07-27 | Disposition: A | Discharge status: Home / Self Care | Payer: Medicare Other | Attending: Osteopathic Medicine | Admitting: Osteopathic Medicine

## 2022-07-26 ENCOUNTER — Other Ambulatory Visit: Payer: Self-pay

## 2022-07-26 DIAGNOSIS — A419 Sepsis, unspecified organism: Principal | ICD-10-CM | POA: Diagnosis present

## 2022-07-26 DIAGNOSIS — N179 Acute kidney failure, unspecified: Secondary | ICD-10-CM | POA: Diagnosis not present

## 2022-07-26 DIAGNOSIS — Z79899 Other long term (current) drug therapy: Secondary | ICD-10-CM | POA: Diagnosis not present

## 2022-07-26 DIAGNOSIS — R739 Hyperglycemia, unspecified: Secondary | ICD-10-CM

## 2022-07-26 DIAGNOSIS — Z1152 Encounter for screening for COVID-19: Secondary | ICD-10-CM | POA: Insufficient documentation

## 2022-07-26 DIAGNOSIS — F32A Depression, unspecified: Secondary | ICD-10-CM | POA: Diagnosis not present

## 2022-07-26 DIAGNOSIS — R1012 Left upper quadrant pain: Secondary | ICD-10-CM | POA: Diagnosis not present

## 2022-07-26 DIAGNOSIS — Z794 Long term (current) use of insulin: Secondary | ICD-10-CM | POA: Insufficient documentation

## 2022-07-26 DIAGNOSIS — F419 Anxiety disorder, unspecified: Secondary | ICD-10-CM | POA: Diagnosis not present

## 2022-07-26 DIAGNOSIS — J189 Pneumonia, unspecified organism: Secondary | ICD-10-CM

## 2022-07-26 DIAGNOSIS — Z7984 Long term (current) use of oral hypoglycemic drugs: Secondary | ICD-10-CM | POA: Diagnosis not present

## 2022-07-26 DIAGNOSIS — E1165 Type 2 diabetes mellitus with hyperglycemia: Secondary | ICD-10-CM | POA: Diagnosis not present

## 2022-07-26 DIAGNOSIS — Z859 Personal history of malignant neoplasm, unspecified: Secondary | ICD-10-CM | POA: Diagnosis not present

## 2022-07-26 DIAGNOSIS — R0602 Shortness of breath: Secondary | ICD-10-CM | POA: Diagnosis present

## 2022-07-26 DIAGNOSIS — I10 Essential (primary) hypertension: Secondary | ICD-10-CM | POA: Insufficient documentation

## 2022-07-26 DIAGNOSIS — Z7951 Long term (current) use of inhaled steroids: Secondary | ICD-10-CM | POA: Diagnosis not present

## 2022-07-26 DIAGNOSIS — K219 Gastro-esophageal reflux disease without esophagitis: Secondary | ICD-10-CM | POA: Insufficient documentation

## 2022-07-26 LAB — LACTIC ACID, PLASMA
Lactic Acid, Venous: 1.5 mmol/L (ref 0.5–1.9)
Lactic Acid, Venous: 1.1 mmol/L (ref 0.5–1.9)

## 2022-07-26 LAB — BASIC METABOLIC PANEL
Anion gap: 8 (ref 5–15)
BUN: 32 mg/dL — ABNORMAL HIGH (ref 8–23)
CO2: 23 mmol/L (ref 22–32)
Calcium: 9.1 mg/dL (ref 8.9–10.3)
Chloride: 93 mmol/L — ABNORMAL LOW (ref 98–111)
Creatinine, Ser: 1.68 mg/dL — ABNORMAL HIGH (ref 0.61–1.24)
GFR, Estimated: 44 mL/min — ABNORMAL LOW (ref >60)
Glucose, Bld: 507 mg/dL (ref 70–99)
Potassium: 4.5 mmol/L (ref 3.5–5.1)
Sodium: 124 mmol/L — ABNORMAL LOW (ref 135–145)

## 2022-07-26 LAB — RESP PANEL BY RT-PCR (FLU A&B, COVID) ARPGX2
Influenza A by PCR: NEGATIVE
Influenza B by PCR: NEGATIVE
SARS Coronavirus 2 by RT PCR: NEGATIVE

## 2022-07-26 LAB — TROPONIN I (HIGH SENSITIVITY)
Troponin I (High Sensitivity): 8 ng/L (ref <18)
Troponin I (High Sensitivity): 9 ng/L (ref <18)

## 2022-07-26 LAB — CBC
HCT: 40.3 % (ref 39.0–52.0)
Hemoglobin: 14 g/dL (ref 13.0–17.0)
MCH: 27.6 pg (ref 26.0–34.0)
MCHC: 34.7 g/dL (ref 30.0–36.0)
MCV: 79.3 fL — ABNORMAL LOW (ref 80.0–100.0)
Platelets: 209 10*3/uL (ref 150–400)
RBC: 5.08 MIL/uL (ref 4.22–5.81)
RDW: 13.2 % (ref 11.5–15.5)
WBC: 14.3 10*3/uL — ABNORMAL HIGH (ref 4.0–10.5)
nRBC: 0 % (ref 0.0–0.2)

## 2022-07-26 LAB — GLUCOSE, CAPILLARY: Glucose-Capillary: 281 mg/dL — ABNORMAL HIGH (ref 70–99)

## 2022-07-26 LAB — LIPASE, BLOOD: Lipase: 43 U/L (ref 11–51)

## 2022-07-26 LAB — GLUCOSE, RANDOM: Glucose, Bld: 471 mg/dL — ABNORMAL HIGH (ref 70–99)

## 2022-07-26 MED ORDER — SODIUM CHLORIDE 0.9 % IV BOLUS
1000.0000 mL | Freq: Once | INTRAVENOUS | Status: AC
  Administered 2022-07-26: 1000 mL via INTRAVENOUS

## 2022-07-26 MED ORDER — ONDANSETRON HCL 4 MG/2ML IJ SOLN: 4.0000 mg | Freq: Four times a day (QID) | INTRAMUSCULAR | Status: DC | PRN

## 2022-07-26 MED ORDER — ENOXAPARIN SODIUM 40 MG/0.4ML IJ SOSY
40.0000 mg | PREFILLED_SYRINGE | INTRAMUSCULAR | Status: DC
  Administered 2022-07-26: 40 mg via SUBCUTANEOUS
  Filled 2022-07-26: qty 0.4

## 2022-07-26 MED ORDER — ONDANSETRON HCL 4 MG/2ML IJ SOLN
4.0000 mg | Freq: Once | INTRAMUSCULAR | Status: AC
  Administered 2022-07-26: 4 mg via INTRAVENOUS
  Filled 2022-07-26: qty 2

## 2022-07-26 MED ORDER — VENLAFAXINE HCL ER 150 MG PO CP24
150.0000 mg | ORAL_CAPSULE | Freq: Every day | ORAL | Status: DC
  Administered 2022-07-27: 150 mg via ORAL
  Filled 2022-07-26 (×2): qty 1

## 2022-07-26 MED ORDER — PANTOPRAZOLE SODIUM 40 MG PO TBEC
40.0000 mg | DELAYED_RELEASE_TABLET | Freq: Every day | ORAL | Status: DC
  Administered 2022-07-26 – 2022-07-27 (×2): 40 mg via ORAL
  Filled 2022-07-26 (×2): qty 1

## 2022-07-26 MED ORDER — SODIUM CHLORIDE 0.9 % IV SOLN: INTRAVENOUS | Status: DC

## 2022-07-26 MED ORDER — ACETAMINOPHEN 325 MG PO TABS: 650.0000 mg | ORAL_TABLET | Freq: Four times a day (QID) | ORAL | Status: DC | PRN

## 2022-07-26 MED ORDER — SODIUM CHLORIDE 0.9 % IV SOLN
500.0000 mg | Freq: Once | INTRAVENOUS | Status: AC
  Administered 2022-07-26: 500 mg via INTRAVENOUS
  Filled 2022-07-26: qty 5

## 2022-07-26 MED ORDER — ALBUTEROL SULFATE HFA 108 (90 BASE) MCG/ACT IN AERS
2.0000 | INHALATION_SPRAY | RESPIRATORY_TRACT | Status: DC | PRN
  Filled 2022-07-26: qty 6.7

## 2022-07-26 MED ORDER — SODIUM CHLORIDE 0.9 % IV SOLN
500.0000 mg | INTRAVENOUS | Status: DC
  Filled 2022-07-26: qty 5

## 2022-07-26 MED ORDER — MAGNESIUM HYDROXIDE 400 MG/5ML PO SUSP: 30.0000 mL | Freq: Every day | ORAL | Status: DC | PRN

## 2022-07-26 MED ORDER — EMPAGLIFLOZIN 10 MG PO TABS
10.0000 mg | ORAL_TABLET | Freq: Every day | ORAL | Status: DC
  Administered 2022-07-27: 10 mg via ORAL
  Filled 2022-07-26: qty 1

## 2022-07-26 MED ORDER — SODIUM CHLORIDE 0.9 % IV SOLN
2.0000 g | INTRAVENOUS | Status: DC
  Administered 2022-07-27: 2 g via INTRAVENOUS
  Filled 2022-07-26: qty 20

## 2022-07-26 MED ORDER — IOHEXOL 300 MG/ML  SOLN
100.0000 mL | Freq: Once | INTRAMUSCULAR | Status: AC | PRN
  Administered 2022-07-26: 100 mL via INTRAVENOUS

## 2022-07-26 MED ORDER — MUPIROCIN 2 % EX OINT
1.0000 | TOPICAL_OINTMENT | Freq: Every day | CUTANEOUS | Status: DC
  Administered 2022-07-27: 1 via TOPICAL
  Filled 2022-07-26: qty 22

## 2022-07-26 MED ORDER — SODIUM CHLORIDE 0.9 % IV SOLN
1.0000 g | Freq: Once | INTRAVENOUS | Status: AC
  Administered 2022-07-26: 1 g via INTRAVENOUS
  Filled 2022-07-26: qty 10

## 2022-07-26 MED ORDER — INSULIN GLARGINE-YFGN 100 UNIT/ML ~~LOC~~ SOLN
35.0000 [IU] | Freq: Every day | SUBCUTANEOUS | Status: DC
  Administered 2022-07-26: 35 [IU] via SUBCUTANEOUS
  Filled 2022-07-26 (×3): qty 0.35

## 2022-07-26 MED ORDER — OLANZAPINE 5 MG PO TABS
7.5000 mg | ORAL_TABLET | Freq: Every day | ORAL | Status: DC
  Administered 2022-07-26: 7.5 mg via ORAL
  Filled 2022-07-26 (×2): qty 2

## 2022-07-26 MED ORDER — TRAZODONE HCL 50 MG PO TABS
25.0000 mg | ORAL_TABLET | Freq: Every evening | ORAL | Status: DC | PRN
  Administered 2022-07-26: 25 mg via ORAL
  Filled 2022-07-26: qty 1

## 2022-07-26 MED ORDER — MORPHINE SULFATE (PF) 4 MG/ML IV SOLN
4.0000 mg | Freq: Once | INTRAVENOUS | Status: AC
  Administered 2022-07-26: 4 mg via INTRAVENOUS
  Filled 2022-07-26: qty 1

## 2022-07-26 MED ORDER — MORPHINE SULFATE (PF) 2 MG/ML IV SOLN
2.0000 mg | INTRAVENOUS | Status: DC | PRN
  Administered 2022-07-27 (×2): 2 mg via INTRAVENOUS
  Filled 2022-07-26 (×2): qty 1

## 2022-07-26 MED ORDER — ACETAMINOPHEN 650 MG RE SUPP: 650.0000 mg | Freq: Four times a day (QID) | RECTAL | Status: DC | PRN

## 2022-07-26 MED ORDER — ONDANSETRON HCL 4 MG PO TABS: 4.0000 mg | ORAL_TABLET | Freq: Four times a day (QID) | ORAL | Status: DC | PRN

## 2022-07-26 NOTE — H&P (Signed)
[place]   PATIENT NAME: [name]    MR#:  [id number]  DATE OF BIRTH:  1955-10-02  DATE OF ADMISSION:  07/26/2022  PRIMARY CARE PHYSICIAN: Theotis Burrow, MD   Patient is coming from: Home  REQUESTING/REFERRING PHYSICIAN: Ashok Cordia, PA-C  CHIEF COMPLAINT:   Chief Complaint  Patient presents with   Shortness of Breath    HISTORY OF PRESENT ILLNESS:  [name] is a 66 y.o. male with medical history significant for type 2 diabetes mellitus, hypertension and lymphoma, who presented to the emergency room with acute onset of worsening cough productive of greenish sputum with associated dyspnea and occasional wheezing which have been going on over the last several days.  The patient has been having flulike symptoms for weeks.  He admitted to left upper quadrant abdominal pain without nausea or vomiting or diarrhea.  No dysuria, oliguria or hematuria or flank pain.  No chest pain or palpitation.  No fever or chills.  ED Course: When into the ER, heart rate was 116 with otherwise normal vital signs.  Labs revealed hyponatremia 124 and hypochloremia 93 and hyperglycemia of 507, BUN of 32 and creatinine 1.68 above previous levels.  Serum lipase was 43.  CBC showed leukocytosis of 14.3 with microcytosis. EKG as reviewed by me : showed sinus tachycardia with rate 110 with right axis deviation and T wave inversion inferiorly and laterally Imaging: Two-view chest x-ray showed suspect left lower lobe pneumonia. Abdominal pelvic and chest CT scan showed the following: 1. Heterogeneous ground-glass density and streaky consolidations most evident within the left greater than right lower lungs with lesser involvement of right middle lobe and lingula, suspicious for multifocal pneumonia. 2. The previously noted mass or masslike consolidation at the right apex has largely resolved, there is residual linear density with mild bronchiectasis likely due to scarring 3.  Status post colectomy with ileoanal anastomosis. Mild wall thickening of the bowel upstream from the anastomosis, correlate for any symptoms of diarrhea. 4. Similar appearance of hypodense liver metastatic lesions 5. Hyperdensity adherent to the wall of gallbladder, question due to adherent tumefactive sludge or stone disease. Polyp or mass could also be considered. This may be correlated with ultrasound.   The patient was given IV Rocephin and Zithromax as well as IV morphine sulfate, Zofran and 2 L bolus of IV normal saline.  He will be admitted to a medical telemetry bed for further evaluation and management.  PAST MEDICAL HISTORY:   Past Medical History:  Diagnosis Date   Cancer ([place])    Diabetes mellitus without complication ([place])     PAST SURGICAL HISTORY:   Past Surgical History:  Procedure Laterality Date   OTHER SURGICAL HISTORY     J pouch 1997, history of ileostomy with reversal   POUCHOSCOPY N/A 04/10/2022   Procedure: POUCHOSCOPY;  Surgeon: Annamaria Helling, DO;  Location: [facility] ENDOSCOPY;  Service: Gastroenterology;  Laterality: N/A;    SOCIAL HISTORY:   Social History   Tobacco Use   Smoking status: Never   Smokeless tobacco: Never  Substance Use Topics   Alcohol use: Never    FAMILY HISTORY:   Family History  Problem Relation Age of Onset   Colon cancer Sister    Crohn's disease Brother     DRUG ALLERGIES:   Allergies  Allergen Reactions   Sulfa Antibiotics Rash and Hives   Oxycodone Other (See Comments) and Rash    Rash and headache    REVIEW OF  SYSTEMS:   ROS As per history of present illness. All pertinent systems were reviewed above. Constitutional, HEENT, cardiovascular, respiratory, GI, GU, musculoskeletal, neuro, psychiatric, endocrine, integumentary and hematologic systems were reviewed and are otherwise negative/unremarkable except for positive findings mentioned above in the HPI.   MEDICATIONS AT HOME:   Prior to Admission  medications   Medication Sig Start Date End Date Taking? Authorizing Provider  JARDIANCE 10 MG TABS tablet Take 10 mg by mouth daily. 03/28/22   [provider]  LANTUS SOLOSTAR 100 UNIT/ML Solostar Pen Inject 35 Units into the skin at bedtime. 09/29/19   [provider]  metFORMIN (GLUCOPHAGE) 1000 MG tablet Take 1,000 mg by mouth 2 (two) times daily. 04/08/17   [provider]  mupirocin ointment (BACTROBAN) 2 % Apply 1 Application topically daily.    [provider]  OLANZapine (ZYPREXA) 5 MG tablet Take 1.5 tablets (7.5 mg total) by mouth at bedtime. 03/16/22 04/15/22  Naaman Plummer, MD  omeprazole (PRILOSEC) 40 MG capsule Take 40 mg by mouth daily.     [provider]  venlafaxine XR (EFFEXOR-XR) 150 MG 24 hr capsule Take 150 mg by mouth daily. 03/20/17   [provider]      VITAL SIGNS:  Blood pressure 121/88, pulse (!) 109, temperature 98.9 F (37.2 C), resp. rate 20, height 5' 10"  (1.778 m), weight 78.5 kg, SpO2 100 %.  PHYSICAL EXAMINATION:  Physical Exam  GENERAL:  66 y.o.-year-old Caucasian male patient lying in the bed with no acute distress.  EYES: Pupils equal, round, reactive to light and accommodation. No scleral icterus. Extraocular muscles intact.  HEENT: Head atraumatic, normocephalic. Oropharynx and nasopharynx clear.  NECK:  Supple, no jugular venous distention. No thyroid enlargement, no tenderness.  LUNGS:Diminished left basal breath sounds with left basal crackles.. No use of accessory muscles of respiration.  CARDIOVASCULAR: Regular rate and rhythm, S1, S2 normal. No murmurs, rubs, or gallops.  ABDOMEN: Soft, nondistended, nontender. Bowel sounds present. No organomegaly or mass.  EXTREMITIES: No pedal edema, cyanosis, or clubbing.  NEUROLOGIC: Cranial nerves II through XII are intact. Muscle strength 5/5 in all extremities. Sensation intact. Gait not checked.  PSYCHIATRIC: The patient is alert and oriented x 3.   Normal affect and good eye contact. SKIN: No obvious rash, lesion, or ulcer.   LABORATORY PANEL:   CBC Recent Labs  Lab 07/26/22 1625  WBC 14.3*  HGB 14.0  HCT 40.3  PLT 209   ------------------------------------------------------------------------------------------------------------------  Chemistries  Recent Labs  Lab 07/26/22 1625 07/26/22 1745  NA 124*  --   K 4.5  --   CL 93*  --   CO2 23  --   GLUCOSE 507* 471*  BUN 32*  --   CREATININE 1.68*  --   CALCIUM 9.1  --    ------------------------------------------------------------------------------------------------------------------  Cardiac Enzymes No results for input(s): "TROPONINI" in the last 168 hours. ------------------------------------------------------------------------------------------------------------------  RADIOLOGY:  CT CHEST ABDOMEN PELVIS W CONTRAST  Result Date: 07/26/2022 CLINICAL DATA:  Chest pain shortness of breath abdomen pain EXAM: CT CHEST, ABDOMEN, AND PELVIS WITH CONTRAST TECHNIQUE: Multidetector CT imaging of the chest, abdomen and pelvis was performed following the standard protocol during bolus administration of intravenous contrast. RADIATION DOSE REDUCTION: This exam was performed according to the departmental dose-optimization program which includes automated exposure control, adjustment of the mA and/or kV according to patient size and/or use of iterative reconstruction technique. CONTRAST:  19m OMNIPAQUE IOHEXOL 300 MG/ML  SOLN COMPARISON:  Chest x-ray 07/26/2022,  CT 04/09/2022, 12/14/2021, chest CT 10/20/2020, 08/16/2020 FINDINGS: CT CHEST FINDINGS Cardiovascular: Right-sided central venous port with tip at the cavoatrial region. Nonaneurysmal aorta. Normal cardiac size. Mild coronary vascular calcification. No pericardial effusion Mediastinum/Nodes: Midline trachea. No thyroid mass. No suspicious mediastinal lymph nodes. Esophagus unremarkable Lungs/Pleura: Previously noted right  apical lung mass/masslike consolidation has largely resolved. Linear density extending to the pleural surface with minimal bronchiectasis is now noted in the region. Heterogeneous ground-glass densities and streaky consolidations in the left greater than right lower lobes with lesser involvement of right middle lobe and lingula suspicious for pneumonia. Musculoskeletal: Sternum is intact.  No acute osseous abnormality. CT ABDOMEN PELVIS FINDINGS Hepatobiliary: Vague hypodense liver lesion at the hepatic dome on the right measures about 21 mm compared with 21 mm previously. Additional hypodense mass adjacent to the gallbladder fossa, inferior right hepatic lobe measures 19 by 13 mm, previously 19 x 12 mm. Hyperdense area adherent to the wall of gallbladder, series 2, image 59. No biliary dilatation. Question subtle surface nodularity of the liver. Pancreas: Unremarkable. No pancreatic ductal dilatation or surrounding inflammatory changes. Spleen: Normal in size without focal abnormality. Adrenals/Urinary Tract: Adrenal glands are unremarkable. Kidneys are normal, without renal calculi, focal lesion, or hydronephrosis. Bladder is unremarkable. Stomach/Bowel: The stomach is nonenlarged. Patient is status post colectomy with ileoanal anastomosis. Mild wall thickening of bowel proximal to the anal anastomosis. Mild gas distension upstream bowel in the pelvis but no convincing obstruction. The remainder of the bowel is nondistended. Vascular/Lymphatic: Nonaneurysmal aorta. Mild atherosclerosis. No suspicious lymph nodes Reproductive: Slightly enlarged prostate Other: Negative for pelvic effusion or free air. Musculoskeletal: No acute osseous abnormality. IMPRESSION: 1. Heterogeneous ground-glass density and streaky consolidations most evident within the left greater than right lower lungs with lesser involvement of right middle lobe and lingula, suspicious for multifocal pneumonia. 2. The previously noted mass or masslike  consolidation at the right apex has largely resolved, there is residual linear density with mild bronchiectasis likely due to scarring 3. Status post colectomy with ileoanal anastomosis. Mild wall thickening of the bowel upstream from the anastomosis, correlate for any symptoms of diarrhea. 4. Similar appearance of hypodense liver metastatic lesions 5. Hyperdensity adherent to the wall of gallbladder, question due to adherent tumefactive sludge or stone disease. Polyp or mass could also be considered. This may be correlated with ultrasound. Electronically Signed   By: Donavan Foil M.D.   On: 07/26/2022 19:16   DG Chest 2 View  Result Date: 07/26/2022 CLINICAL DATA:  Shortness of breath. EXAM: CHEST - 2 VIEW COMPARISON:  Chest two views 10/20/2020 FINDINGS: Right chest wall porta catheter is again seen with the tip overlying the superior vena cava/right atrial junction. Cardiac silhouette and mediastinal contours are within normal limits with mild calcification within aortic arch. Mildly decreased lung volumes. Horizontal linear left costophrenic angle densities are increased from prior, possibly anterior left lower lobe subsegmental atelectasis versus scarring. No pleural effusion or pneumothorax. Mild-to-moderate multilevel degenerative disc changes of the thoracic spine. Partially visualized ACDF hardware. Left upper abdominal quadrant surgical clips. IMPRESSION: Mildly decreased lung volumes. Increased linear left costophrenic angle densities, possibly subsegmental atelectasis versus scarring. It is difficult to exclude mild pneumonia. Electronically Signed   By: Yvonne Kendall M.D.   On: 07/26/2022 17:14      IMPRESSION AND PLAN:  Assessment and Plan: * Sepsis due to pneumonia [facility]) - The patient will be admitted to a medical bed. - Sepsis manifested by leukocytosis and tachycardia. - This is  secondary to multifocal pneumonia as shown on chest CT. - We will continue antibiotic therapy with IV  Rocephin and Zithromax. - We will continue hydration with IV normal saline. - We will follow blood cultures. - Mucolytic therapy will be provided as well as bronchodilator therapy.  AKI (acute kidney injury) ([place]) - The will be hydrated with IV normal saline and will follow BMP. - We will avoid nephrotoxins. - This likely prerenal due to volume depletion and dehydration  Uncontrolled type 2 diabetes mellitus with hyperglycemia, with long-term current use of insulin ([place]) - The patient will be placed on supplemental coverage with resistant NovoLog. - We will continue basal coverage. - We will continue Jardiance and hold off metformin.  Essential hypertension - We will continue his antihypertensives  Anxiety and depression - We will continue Ativan and Effexor XR.  GERD without esophagitis We will continue PPI therapy    DVT prophylaxis: Lovenox.  Advanced Care Planning:  Code Status: full code.  Family Communication:  The plan of care was discussed in details with the patient (and family). I answered all questions. The patient agreed to proceed with the above mentioned plan. Further management will depend upon hospital course. Disposition Plan: Back to previous home environment Consults called: none. All the records are reviewed and case discussed with ED provider.  Status is: Inpatient   At the time of the admission, it appears that the appropriate admission status for this patient is inpatient.  This is judged to be reasonable and necessary in order to provide the required intensity of service to ensure the patient's safety given the presenting symptoms, physical exam findings and initial radiographic and laboratory data in the context of comorbid conditions.  The patient requires inpatient status due to high intensity of service, high risk of further deterioration and high frequency of surveillance required.  I certify that at the time of admission, it is my clinical  judgment that the patient will require inpatient hospital care extending more than 2 midnights.                            Dispo: The patient is from: Home              Anticipated d/c is to: Home              Patient currently is not medically stable to d/c.              Difficult to place patient: No  Christel Mormon M.D on 07/27/2022 at 3:59 AM  [facility]   From 7 PM-7 AM, contact night-coverage [email or web address]  CC: Primary care physician; Revelo, Elyse Jarvis, MD

## 2022-07-26 NOTE — ED Provider Notes (Signed)
[facility] Provider Note    Event Date/Time   First MD Initiated Contact with Patient 07/26/22 1652     (approximate)   History   Shortness of Breath   HPI  [name] is a 66 y.o. male with history of diabetes release, hypertension, and lymphoma presents emergency department complaining of pain behind the left scapula, pain in his abdomen, some sore throat, just generally not feeling well.  Symptoms have been ongoing for 2 days.  No vomiting or diarrhea.  Does complain of some shortness of breath      Physical Exam   Triage Vital Signs: ED Triage Vitals  Enc Vitals Group     BP 07/26/22 1623 138/73     Pulse Rate 07/26/22 1623 (!) 116     Resp 07/26/22 1623 18     Temp 07/26/22 1623 97.8 F (36.6 C)     Temp Source 07/26/22 1623 Oral     SpO2 07/26/22 1623 100 %     Weight 07/26/22 1623 173 lb (78.5 kg)     Height 07/26/22 1623 5' 10"  (1.778 m)     Head Circumference --      Peak Flow --      Pain Score 07/26/22 1626 4     Pain Loc --      Pain Edu? --      Excl. in [place]? --     Most recent vital signs: Vitals:   07/26/22 2003 07/26/22 2141  BP: 131/86 121/88  Pulse: (!) 111 (!) 109  Resp: (!) 21 20  Temp: 98.1 F (36.7 C) 98.9 F (37.2 C)  SpO2: 95% 100%     General: Awake, no distress.   CV:  Good peripheral perfusion.  Tachycardic and regular rhythm Resp:  Normal effort. Lungs CTA Abd:  No distention.  Abdomen is tender in the lower quadrants Other:  No swelling in lower extremities, neurovascular is intact   ED Results / Procedures / Treatments   Labs (all labs ordered are listed, but only abnormal results are displayed) Labs Reviewed  CBC - Abnormal; Notable for the following components:      Result Value   WBC 14.3 (*)    MCV 79.3 (*)    All other components within normal limits  BASIC METABOLIC PANEL - Abnormal; Notable for the following components:   Sodium 124 (*)    Chloride 93 (*)    Glucose, Bld 507  (*)    BUN 32 (*)    Creatinine, Ser 1.68 (*)    GFR, Estimated 44 (*)    All other components within normal limits  GLUCOSE, RANDOM - Abnormal; Notable for the following components:   Glucose, Bld 471 (*)    All other components within normal limits  GLUCOSE, CAPILLARY - Abnormal; Notable for the following components:   Glucose-Capillary 281 (*)    All other components within normal limits  RESP PANEL BY RT-PCR (FLU A&B, COVID) ARPGX2  LACTIC ACID, PLASMA  LACTIC ACID, PLASMA  LIPASE, BLOOD  PROTIME-INR  CORTISOL-AM, BLOOD  PROCALCITONIN  BASIC METABOLIC PANEL  CBC  CBG MONITORING, ED  TROPONIN I (HIGH SENSITIVITY)  TROPONIN I (HIGH SENSITIVITY)     EKG  EKG   RADIOLOGY Chest x-ray, CT chest abdomen pelvis    PROCEDURES:   .Critical Care  Performed by: Versie Starks, PA-C Authorized by: Versie Starks, PA-C   Critical care provider statement:    Critical care time (minutes):  35   Critical care time was exclusive of:  Separately billable procedures and treating other patients   Critical care was necessary to treat or prevent imminent or life-threatening deterioration of the following conditions:  Respiratory failure, metabolic crisis and sepsis   Critical care was time spent personally by me on the following activities:  Blood draw for specimens, development of treatment plan with patient or surrogate, evaluation of patient's response to treatment, examination of patient, interpretation of cardiac output measurements, obtaining history from patient or surrogate, ordering and performing treatments and interventions, ordering and review of laboratory studies, ordering and review of radiographic studies, pulse oximetry, re-evaluation of patient's condition and review of old charts   Care discussed with: admitting provider      [place] ED: Medications  albuterol (VENTOLIN HFA) 108 (90 Base) MCG/ACT inhaler 2 puff (has no administration in time  range)  OLANZapine (ZYPREXA) tablet 7.5 mg (7.5 mg Oral Given 07/26/22 2245)  venlafaxine XR (EFFEXOR-XR) 24 hr capsule 150 mg (has no administration in time range)  empagliflozin (JARDIANCE) tablet 10 mg (has no administration in time range)  insulin glargine-yfgn (SEMGLEE) injection 35 Units (35 Units Subcutaneous Given 07/26/22 2329)  pantoprazole (PROTONIX) EC tablet 40 mg (40 mg Oral Given 07/26/22 2245)  mupirocin ointment (BACTROBAN) 2 % 1 Application (has no administration in time range)  enoxaparin (LOVENOX) injection 40 mg (40 mg Subcutaneous Given 07/26/22 2245)  0.9 %  sodium chloride infusion ( Intravenous New Bag/Given 07/26/22 2255)  cefTRIAXone (ROCEPHIN) 2 g in sodium chloride 0.9 % 100 mL IVPB (has no administration in time range)  azithromycin (ZITHROMAX) 500 mg in sodium chloride 0.9 % 250 mL IVPB (has no administration in time range)  acetaminophen (TYLENOL) tablet 650 mg (has no administration in time range)    Or  acetaminophen (TYLENOL) suppository 650 mg (has no administration in time range)  traZODone (DESYREL) tablet 25 mg (25 mg Oral Given 07/26/22 2245)  magnesium hydroxide (MILK OF MAGNESIA) suspension 30 mL (has no administration in time range)  ondansetron (ZOFRAN) tablet 4 mg (has no administration in time range)    Or  ondansetron (ZOFRAN) injection 4 mg (has no administration in time range)  morphine (PF) 2 MG/ML injection 2 mg (has no administration in time range)  sodium chloride 0.9 % bolus 1,000 mL (0 mLs Intravenous Stopped 07/26/22 1908)  iohexol (OMNIPAQUE) 300 MG/ML solution 100 mL (100 mLs Intravenous Contrast Given 07/26/22 1836)  cefTRIAXone (ROCEPHIN) 1 g in sodium chloride 0.9 % 100 mL IVPB (0 g Intravenous Stopped 07/26/22 2023)  azithromycin (ZITHROMAX) 500 mg in sodium chloride 0.9 % 250 mL IVPB (0 mg Intravenous Stopped 07/26/22 2136)  sodium chloride 0.9 % bolus 1,000 mL (0 mLs Intravenous Stopped 07/26/22 2048)  morphine (PF) 4 MG/ML injection 4 mg  (4 mg Intravenous Given 07/26/22 2005)  ondansetron (ZOFRAN) injection 4 mg (4 mg Intravenous Given 07/26/22 2002)     IMPRESSION / MDM / [place] / ED COURSE  I reviewed the triage vital signs and the nursing notes.                              Differential diagnosis includes, but is not limited to, COVID, CAP, MI, gastroenteritis, bowel obstruction, diverticulitis, recurrent lymphoma  Patient's presentation is most consistent with acute presentation with potential threat to life or bodily function.   EKG shows tachycardia, 117 bpm, normal QT, some right axis  deviation, no STEMI noted, see physician read  Patient CBC is concerning for infection with elevated WBC of 34.3, basic metabolic panel is critical value glucose at 507, elevated BUN and creatinine and sodium is depressed at 124  Added labs for lipase, troponin, lactic acid and respiratory panel.  Chest x-ray independently reviewed and interpreted by me possible infiltrate left lower lung,  CT chest abdomen and pelvis with IV contrast independently reviewed and interpreted by me.  This is showing a multifocal pneumonia.  Due to the patient being diabetic with elevated glucose of 507, he continues to be tachycardic even after 1 L of fluid, will opt for admission for the patient.  He is in agreement with this.  He was started on Rocephin 1 g IV along with Zithromax 500 mg IV.  We gave him a second liter of normal saline which is compatible with the antibiotics.  Consult hospitalist for admission  Dr Sidney Ace to admit, pt is stable at this time      FINAL CLINICAL IMPRESSION(S) / ED DIAGNOSES   Final diagnoses:  Multifocal pneumonia  Hyperglycemia  Acute sepsis ([place])     Rx / DC Orders   ED Discharge Orders     None        Note:  This document was prepared using Dragon voice recognition software and may include unintentional dictation errors.    Versie Starks, PA-C 07/26/22 Linward Foster    Lucillie Garfinkel,  MD 07/27/22 [phone]

## 2022-07-26 NOTE — ED Notes (Signed)
ED Provider at bedside. 

## 2022-07-26 NOTE — ED Triage Notes (Signed)
Pt comes with c/o sob that started today. Pt also states he just feels bad all over. Pt states belly pain.

## 2022-07-26 NOTE — ED Notes (Signed)
Critical test result: Glucose: 62 Manuela Schwartz, [place] notified.

## 2022-07-26 NOTE — ED Notes (Signed)
Request made for transport to the floor ?

## 2022-07-27 ENCOUNTER — Encounter: Payer: Self-pay | Admitting: Family Medicine

## 2022-07-27 DIAGNOSIS — F32A Depression, unspecified: Secondary | ICD-10-CM | POA: Insufficient documentation

## 2022-07-27 DIAGNOSIS — E1165 Type 2 diabetes mellitus with hyperglycemia: Secondary | ICD-10-CM

## 2022-07-27 DIAGNOSIS — I10 Essential (primary) hypertension: Secondary | ICD-10-CM | POA: Diagnosis not present

## 2022-07-27 DIAGNOSIS — F419 Anxiety disorder, unspecified: Secondary | ICD-10-CM

## 2022-07-27 DIAGNOSIS — N179 Acute kidney failure, unspecified: Secondary | ICD-10-CM | POA: Diagnosis not present

## 2022-07-27 DIAGNOSIS — J189 Pneumonia, unspecified organism: Secondary | ICD-10-CM

## 2022-07-27 DIAGNOSIS — Z794 Long term (current) use of insulin: Secondary | ICD-10-CM

## 2022-07-27 DIAGNOSIS — A419 Sepsis, unspecified organism: Secondary | ICD-10-CM

## 2022-07-27 DIAGNOSIS — K219 Gastro-esophageal reflux disease without esophagitis: Secondary | ICD-10-CM | POA: Insufficient documentation

## 2022-07-27 LAB — BASIC METABOLIC PANEL
Anion gap: 8 (ref 5–15)
BUN: 25 mg/dL — ABNORMAL HIGH (ref 8–23)
CO2: 22 mmol/L (ref 22–32)
Calcium: 9 mg/dL (ref 8.9–10.3)
Chloride: 107 mmol/L (ref 98–111)
Creatinine, Ser: 1.56 mg/dL — ABNORMAL HIGH (ref 0.61–1.24)
GFR, Estimated: 48 mL/min — ABNORMAL LOW (ref >60)
Glucose, Bld: 172 mg/dL — ABNORMAL HIGH (ref 70–99)
Potassium: 4.1 mmol/L (ref 3.5–5.1)
Sodium: 137 mmol/L (ref 135–145)

## 2022-07-27 LAB — CBC
HCT: 37 % — ABNORMAL LOW (ref 39.0–52.0)
Hemoglobin: 12.6 g/dL — ABNORMAL LOW (ref 13.0–17.0)
MCH: 27.2 pg (ref 26.0–34.0)
MCHC: 34.1 g/dL (ref 30.0–36.0)
MCV: 79.7 fL — ABNORMAL LOW (ref 80.0–100.0)
Platelets: 180 10*3/uL (ref 150–400)
RBC: 4.64 MIL/uL (ref 4.22–5.81)
RDW: 13.6 % (ref 11.5–15.5)
WBC: 11 10*3/uL — ABNORMAL HIGH (ref 4.0–10.5)
nRBC: 0 % (ref 0.0–0.2)

## 2022-07-27 LAB — PROTIME-INR
INR: 1.1 (ref 0.8–1.2)
Prothrombin Time: 14.5 seconds (ref 11.4–15.2)

## 2022-07-27 LAB — CORTISOL-AM, BLOOD: Cortisol - AM: 17.2 ug/dL (ref 6.7–22.6)

## 2022-07-27 LAB — GLUCOSE, CAPILLARY: Glucose-Capillary: 220 mg/dL — ABNORMAL HIGH (ref 70–99)

## 2022-07-27 LAB — PROCALCITONIN: Procalcitonin: 0.44 ng/mL

## 2022-07-27 MED ORDER — INSULIN ASPART 100 UNIT/ML IJ SOLN
0.0000 [IU] | Freq: Three times a day (TID) | INTRAMUSCULAR | Status: DC
  Administered 2022-07-27: 4 [IU] via SUBCUTANEOUS
  Administered 2022-07-27: 7 [IU] via SUBCUTANEOUS
  Filled 2022-07-27 (×2): qty 1

## 2022-07-27 MED ORDER — ALBUTEROL SULFATE HFA 108 (90 BASE) MCG/ACT IN AERS: 2.0000 | INHALATION_SPRAY | RESPIRATORY_TRACT | 0 refills | Status: DC | PRN

## 2022-07-27 MED ORDER — CYCLOBENZAPRINE HCL 10 MG PO TABS: 10.0000 mg | ORAL_TABLET | Freq: Three times a day (TID) | ORAL | 0 refills | Status: DC | PRN

## 2022-07-27 MED ORDER — OLANZAPINE 5 MG PO TABS: 7.5000 mg | ORAL_TABLET | Freq: Every day | ORAL | 0 refills | Status: DC

## 2022-07-27 MED ORDER — AMOXICILLIN-POT CLAVULANATE 500-125 MG PO TABS: 1.0000 | ORAL_TABLET | Freq: Two times a day (BID) | ORAL | 0 refills | Status: DC

## 2022-07-27 MED ORDER — INSULIN ASPART 100 UNIT/ML IJ SOLN: 0.0000 [IU] | Freq: Every day | INTRAMUSCULAR | Status: DC

## 2022-07-27 MED ORDER — AZITHROMYCIN 250 MG PO TABS: 250.0000 mg | ORAL_TABLET | Freq: Every day | ORAL | 0 refills | Status: AC

## 2022-07-27 MED ORDER — AMOXICILLIN-POT CLAVULANATE 500-125 MG PO TABS: 1.0000 | ORAL_TABLET | Freq: Two times a day (BID) | ORAL | 0 refills | Status: AC

## 2022-07-27 MED ORDER — INFLUENZA VAC A&B SA ADJ QUAD 0.5 ML IM PRSY: 0.5000 mL | PREFILLED_SYRINGE | INTRAMUSCULAR | Status: DC

## 2022-07-27 MED ORDER — PNEUMOCOCCAL 20-VAL CONJ VACC 0.5 ML IM SUSY: 0.5000 mL | PREFILLED_SYRINGE | INTRAMUSCULAR | Status: DC

## 2022-07-27 MED ORDER — CYCLOBENZAPRINE HCL 10 MG PO TABS: 10.0000 mg | ORAL_TABLET | Freq: Three times a day (TID) | ORAL | 0 refills | Status: AC | PRN

## 2022-07-27 MED ORDER — ALBUTEROL SULFATE HFA 108 (90 BASE) MCG/ACT IN AERS: 2.0000 | INHALATION_SPRAY | RESPIRATORY_TRACT | 0 refills | Status: AC | PRN

## 2022-07-27 MED ORDER — CYCLOBENZAPRINE HCL 10 MG PO TABS: 10.0000 mg | ORAL_TABLET | Freq: Three times a day (TID) | ORAL | Status: DC | PRN

## 2022-07-27 MED ORDER — AZITHROMYCIN 250 MG PO TABS: 250.0000 mg | ORAL_TABLET | Freq: Every day | ORAL | 0 refills | Status: DC

## 2022-07-27 NOTE — Assessment & Plan Note (Signed)
-   The will be hydrated with IV normal saline and will follow BMP. - We will avoid nephrotoxins. - This likely prerenal due to volume depletion and dehydration

## 2022-07-27 NOTE — Assessment & Plan Note (Signed)
-   We will continue Ativan and Effexor XR.

## 2022-07-27 NOTE — Hospital Course (Addendum)
[name] is a 66 y.o. male with medical history significant for type 2 diabetes mellitus, hypertension and lymphoma, who presented to the emergency room with acute onset of worsening cough productive of greenish sputum with associated dyspnea and occasional wheezing which have been going on over the last several days.    11/02: heart rate 116 with otherwise normal vital signs.  Labs hyponatremia 124 and hypochloremia 93 and hyperglycemia of 507, BUN of 32 and creatinine 1.68 above previous levels.  Serum lipase was 43.  CBC showed leukocytosis of 14.3 with microcytosis. EKG sinus tachycardia with rate 110 with right axis deviation and T wave inversion inferiorly and laterally. Imaging: Two-view chest x-ray showed suspect left lower lobe pneumonia.  CT chest/abdomen/pelvis concerning for multifocal pneumonia, mild bowel wall thickening upstream from colectomy anastomosis, stable/similar hypodense liver metastatic lesions, possible polyp/mass adherent to the wall of the gallbladder consider ultrasound (of note, patient reported LUQ pain, no diarrhea).  Treated with Rocephin, Zithromax, admitted to hospitalist service for sepsis due to pneumonia, AKI. 11/03: Remains slightly tachycardic but heart rate has improved, SPO2 99% RA, hyponatremia resolved, hyperglycemia resolved, creatinine improved to 1.56, WBC improved to 11.0  Consultants:  none  Procedures: none      ASSESSMENT & PLAN:   Principal Problem:   Sepsis due to pneumonia [facility]) Active Problems:   AKI (acute kidney injury) ([place])   Uncontrolled type 2 diabetes mellitus with hyperglycemia, with long-term current use of insulin ([place])   Essential hypertension   GERD without esophagitis   Anxiety and depression   Multifocal pneumonia   Sepsis due to pneumonia ([place]) Sepsis manifested by leukocytosis and tachycardia. This is secondary to multifocal pneumonia as shown on chest CT. antibiotic therapy with IV Rocephin and  Zithromax. hydration with IV normal saline. follow blood cultures. Mucolytic therapy will be provided as well as bronchodilator therapy.  Uncontrolled type 2 diabetes mellitus with hyperglycemia, with long-term current use of insulin (HCC) supplemental coverage with resistant NovoLog. continue basal coverage. continue Jardiance and hold off metformin.  Pseudohyponatremia d/t hyperglycemia / possible true Hyponatremia  Intial Sodium corrected to 133-136, still potentially low Recheck BMP --> normalized   Essential hypertension continue his antihypertensives  AKI (acute kidney injury) ([place]) likely prerenal due to volume depletion and dehydration IV normal saline and will follow BMP. avoid nephrotoxins.  GERD without esophagitis continue PPI therapy  Anxiety and depression continue Ativan and Effexor XR.    DVT prophylaxis: *** Pertinent IV fluids/nutrition: Discontinued IV fluids this morning Central lines / invasive devices: None  Code Status: Full code Family Communication: ***  Disposition: Inpatient, anticipate discharge back to previous home environment TOC needs: None at this time Barriers to discharge / significant pending items: Awaiting blood cultures, improvement in AKI, improvement in respiratory status will likely need at least 1-2 more days IV antibiotics for multifocal pneumonia

## 2022-07-27 NOTE — Assessment & Plan Note (Signed)
-   We will continue PPI therapy 

## 2022-07-27 NOTE — Assessment & Plan Note (Signed)
-   We will continue his antihypertensives. 

## 2022-07-27 NOTE — Discharge Summary (Signed)
Physician Discharge Summary   Patient: [name] MRN: [id number]  DOB: Apr 29, 1956   Admit:     Date of Admission: 07/26/2022 Admitted from: home   Discharge: Date of discharge: 07/27/22 Disposition: Home Condition at discharge: good  CODE STATUS: FULL CODE     Discharge Physician: Emeterio Reeve, DO [facility]     PCP: Theotis Burrow, MD  Recommendations for Outpatient Follow-up:  Follow up with PCP Revelo, Elyse Jarvis, MD in 1-2 weeks Please follow up on the following pending results: blood cultures  PCP AND OTHER OUTPATIENT PROVIDERS: SEE BELOW FOR SPECIFIC DISCHARGE INSTRUCTIONS PRINTED FOR PATIENT IN ADDITION TO GENERIC AVS PATIENT INFO    Discharge Instructions     Diet - low sodium heart healthy   Complete by: As directed    Increase activity slowly   Complete by: As directed          Discharge Diagnoses: Principal Problem:   Sepsis due to pneumonia [facility]) Active Problems:   AKI (acute kidney injury) ([place])   Uncontrolled type 2 diabetes mellitus with hyperglycemia, with long-term current use of insulin ([place])   Essential hypertension   GERD without esophagitis   Anxiety and depression   Multifocal pneumonia       Hospital Course: [name] is a 66 y.o. male with medical history significant for type 2 diabetes mellitus, hypertension and lymphoma, who presented to the emergency room with acute onset of worsening cough productive of greenish sputum with associated dyspnea and occasional wheezing which have been going on over the last several days.    11/02: heart rate 116 with otherwise normal vital signs.  Labs hyponatremia 124 and hypochloremia 93 and hyperglycemia of 507, BUN of 32 and creatinine 1.68 above previous levels.  Serum lipase was 43.  CBC showed leukocytosis of 14.3 with microcytosis. EKG sinus tachycardia with rate 110 with right axis deviation and T wave inversion inferiorly and laterally. Imaging:  Two-view chest x-ray showed suspect left lower lobe pneumonia.  CT chest/abdomen/pelvis concerning for multifocal pneumonia, mild bowel wall thickening upstream from colectomy anastomosis, stable/similar hypodense liver metastatic lesions, possible polyp/mass adherent to the wall of the gallbladder consider ultrasound (of note, patient reported LUQ pain, no diarrhea).  Treated with Rocephin, Zithromax, admitted to hospitalist service for sepsis due to pneumonia, AKI. 11/03: Remains slightly tachycardic but heart rate has improved, SPO2 99% RA, hyponatremia resolved, hyperglycemia resolved, creatinine improved to 1.56, WBC improved to 11.0. Pt feeling well this afternoon and despite tachycardia would like to go home if possible, I think this is reasonable w/ close outpatient follow up - he has been ambulating the halls without difficulty and not needing supplemental O2   Consultants:  none  Procedures: none      ASSESSMENT & PLAN:   Principal Problem:   Sepsis due to pneumonia [facility]) Active Problems:   AKI (acute kidney injury) ([place])   Uncontrolled type 2 diabetes mellitus with hyperglycemia, with long-term current use of insulin ([place])   Essential hypertension   GERD without esophagitis   Anxiety and depression   Multifocal pneumonia   Sepsis due to pneumonia ([place]) Sepsis manifested by leukocytosis and tachycardia. This is secondary to multifocal pneumonia as shown on chest CT. antibiotic therapy with IV Rocephin and Zithromax. hydration with IV normal saline. follow blood cultures. Mucolytic therapy will be provided as well as bronchodilator therapy. Pt feeling well this afternoon and despite tachycardia would like to go home if possible, I think this is  reasonable w/ close outpatient follow up - he has been ambulating the halls without difficulty and not needing supplemental O2   Uncontrolled type 2 diabetes mellitus with hyperglycemia, with long-term current use of insulin  (HCC) supplemental coverage with resistant NovoLog. continue basal coverage. continue Jardiance and hold off metformin.  Pseudohyponatremia d/t hyperglycemia / possible true Hyponatremia  Intial Sodium corrected to 133-136, still potentially low Recheck BMP --> normalized   Essential hypertension continue his antihypertensives  AKI (acute kidney injury) ([place]) likely prerenal due to volume depletion and dehydration IV normal saline and will follow BMP --> improved, continue po fluids   avoid nephrotoxins.  GERD without esophagitis continue PPI therapy  Anxiety and depression continue Ativan and Effexor XR.            Discharge Instructions  Allergies as of 07/27/2022       Reactions   Sulfa Antibiotics Rash, Hives   Oxycodone Other (See Comments), Rash   Rash and headache        Medication List     STOP taking these medications    atorvastatin 40 MG tablet Commonly known as: LIPITOR   midodrine 5 MG tablet Commonly known as: PROAMATINE       TAKE these medications    albuterol 108 (90 Base) MCG/ACT inhaler Commonly known as: VENTOLIN HFA Inhale 2 puffs into the lungs every 2 (two) hours as needed for wheezing or shortness of breath.   amoxicillin-clavulanate 500-125 MG tablet Commonly known as: AUGMENTIN Take 1 tablet by mouth 2 (two) times daily for 5 days.   azithromycin 250 MG tablet Commonly known as: Zithromax Take 1 tablet (250 mg total) by mouth daily for 4 days. Start taking on: July 28, 2022   cyclobenzaprine 10 MG tablet Commonly known as: FLEXERIL Take 1 tablet (10 mg total) by mouth 3 (three) times daily as needed for muscle spasms.   Jardiance 10 MG Tabs tablet Generic drug: empagliflozin Take 10 mg by mouth daily.   Lantus SoloStar 100 UNIT/ML Solostar Pen Generic drug: insulin glargine Inject 35 Units into the skin at bedtime.   LORazepam 0.5 MG tablet Commonly known as: ATIVAN Take 0.5 mg by mouth daily as  needed.   metFORMIN 1000 MG tablet Commonly known as: GLUCOPHAGE Take 1,000 mg by mouth 2 (two) times daily.   mupirocin ointment 2 % Commonly known as: BACTROBAN Apply 1 Application topically daily.   OLANZapine 5 MG tablet Commonly known as: ZyPREXA Take 1.5 tablets (7.5 mg total) by mouth at bedtime.   omeprazole 40 MG capsule Commonly known as: PRILOSEC Take 40 mg by mouth daily.   venlafaxine XR 150 MG 24 hr capsule Commonly known as: EFFEXOR-XR Take 150 mg by mouth daily.         Follow-up Information     Revelo, Elyse Jarvis, MD. Call.   Specialty: Family Medicine Why: Make appointment for hospital follow up for pneumonia Contact information: [address] [place] [address] [phone]                 Allergies  Allergen Reactions   Sulfa Antibiotics Rash and Hives   Oxycodone Other (See Comments) and Rash    Rash and headache     Subjective: pt feeling well, no SOB, no cough, no palpitations    Discharge Exam: BP 106/69 (BP Location: Right Arm)   Pulse 76   Temp 98.3 F (36.8 C)   Resp 16   Ht 5' 10"  (1.778 m)  Wt 78.5 kg   SpO2 99%   BMI 24.82 kg/m  General: Pt is alert, awake, not in acute distress Cardiovascular: Reg rhythm, tachycardic, S1/S2 +, no rubs, no gallops Respiratory: CTA bilaterally, no wheezing, no rhonchi Abdominal: Soft, NT, ND, bowel sounds + Extremities: no edema, no cyanosis     The results of significant diagnostics from this hospitalization (including imaging, microbiology, ancillary and laboratory) are listed below for reference.     Microbiology: Recent Results (from the past 240 hour(s))  Resp Panel by RT-PCR (Flu A&B, Covid) Anterior Nasal Swab     Status: None   Collection Time: 07/26/22  7:21 PM   Specimen: Anterior Nasal Swab  Result Value Ref Range Status   SARS Coronavirus 2 by RT PCR NEGATIVE NEGATIVE Final    Comment: (NOTE) SARS-CoV-2 target nucleic acids are NOT  DETECTED.  The SARS-CoV-2 RNA is generally detectable in upper respiratory specimens during the acute phase of infection. The lowest concentration of SARS-CoV-2 viral copies this assay can detect is 138 copies/mL. A negative result does not preclude SARS-Cov-2 infection and should not be used as the sole basis for treatment or other patient management decisions. A negative result may occur with  improper specimen collection/handling, submission of specimen other than nasopharyngeal swab, presence of viral mutation(s) within the areas targeted by this assay, and inadequate number of viral copies(<138 copies/mL). A negative result must be combined with clinical observations, patient history, and epidemiological information. The expected result is Negative.  Fact Sheet for Patients:  EntrepreneurPulse.com.au  Fact Sheet for Healthcare Providers:  IncredibleEmployment.be  This test is no t yet approved or cleared by the [place] FDA and  has been authorized for detection and/or diagnosis of SARS-CoV-2 by FDA under an Emergency Use Authorization (EUA). This EUA will remain  in effect (meaning this test can be used) for the duration of the COVID-19 declaration under Section 564(b)(1) of the Act, 21 U.S.C.section 360bbb-3(b)(1), unless the authorization is terminated  or revoked sooner.       Influenza A by PCR NEGATIVE NEGATIVE Final   Influenza B by PCR NEGATIVE NEGATIVE Final    Comment: (NOTE) The Xpert Xpress SARS-CoV-2/FLU/RSV plus assay is intended as an aid in the diagnosis of influenza from Nasopharyngeal swab specimens and should not be used as a sole basis for treatment. Nasal washings and aspirates are unacceptable for Xpert Xpress SARS-CoV-2/FLU/RSV testing.  Fact Sheet for Patients: EntrepreneurPulse.com.au  Fact Sheet for Healthcare Providers: IncredibleEmployment.be  This test is not yet  approved or cleared by the [place] FDA and has been authorized for detection and/or diagnosis of SARS-CoV-2 by FDA under an Emergency Use Authorization (EUA). This EUA will remain in effect (meaning this test can be used) for the duration of the COVID-19 declaration under Section 564(b)(1) of the Act, 21 U.S.C. section 360bbb-3(b)(1), unless the authorization is terminated or revoked.  Performed at [facility], [address] [place], [place] [address]      Labs: BNP (last 3 results) No results for input(s): "BNP" in the last 8760 hours. Basic Metabolic Panel: Recent Labs  Lab 07/26/22 1625 07/26/22 1745 07/27/22 0508  NA 124*  --  137  K 4.5  --  4.1  CL 93*  --  107  CO2 23  --  22  GLUCOSE 507* 471* 172*  BUN 32*  --  25*  CREATININE 1.68*  --  1.56*  CALCIUM 9.1  --  9.0   Liver Function Tests: No  results for input(s): "AST", "ALT", "ALKPHOS", "BILITOT", "PROT", "ALBUMIN" in the last 168 hours. Recent Labs  Lab 07/26/22 1745  LIPASE 43   No results for input(s): "AMMONIA" in the last 168 hours. CBC: Recent Labs  Lab 07/26/22 1625 07/27/22 0508  WBC 14.3* 11.0*  HGB 14.0 12.6*  HCT 40.3 37.0*  MCV 79.3* 79.7*  PLT 209 180   Cardiac Enzymes: No results for input(s): "CKTOTAL", "CKMB", "CKMBINDEX", "TROPONINI" in the last 168 hours. BNP: Invalid input(s): "POCBNP" CBG: Recent Labs  Lab 07/26/22 2319 07/27/22 1200  GLUCAP 281* 220*   D-Dimer No results for input(s): "DDIMER" in the last 72 hours. Hgb A1c No results for input(s): "HGBA1C" in the last 72 hours. Lipid Profile No results for input(s): "CHOL", "HDL", "LDLCALC", "TRIG", "CHOLHDL", "LDLDIRECT" in the last 72 hours. Thyroid function studies No results for input(s): "TSH", "T4TOTAL", "T3FREE", "THYROIDAB" in the last 72 hours.  Invalid input(s): "FREET3" Anemia work up No results for input(s): "VITAMINB12", "FOLATE", "FERRITIN", "TIBC", "IRON", "RETICCTPCT" in the last  72 hours. Urinalysis    Component Value Date/Time   COLORURINE COLORLESS (A) 04/07/2022 0036   APPEARANCEUR CLEAR (A) 04/07/2022 0036   APPEARANCEUR Clear 10/22/2014 1107   LABSPEC 1.012 04/07/2022 0036   LABSPEC 1.015 10/22/2014 1107   PHURINE 6.0 04/07/2022 0036   GLUCOSEU >=500 (A) 04/07/2022 0036   GLUCOSEU Negative 10/22/2014 1107   HGBUR NEGATIVE 04/07/2022 0036   BILIRUBINUR NEGATIVE 04/07/2022 0036   BILIRUBINUR Negative 10/22/2014 1107   KETONESUR NEGATIVE 04/07/2022 0036   PROTEINUR NEGATIVE 04/07/2022 0036   NITRITE NEGATIVE 04/07/2022 0036   LEUKOCYTESUR NEGATIVE 04/07/2022 0036   LEUKOCYTESUR Negative 10/22/2014 1107   Sepsis Labs Recent Labs  Lab 07/26/22 1625 07/27/22 0508  WBC 14.3* 11.0*   Microbiology Recent Results (from the past 240 hour(s))  Resp Panel by RT-PCR (Flu A&B, Covid) Anterior Nasal Swab     Status: None   Collection Time: 07/26/22  7:21 PM   Specimen: Anterior Nasal Swab  Result Value Ref Range Status   SARS Coronavirus 2 by RT PCR NEGATIVE NEGATIVE Final    Comment: (NOTE) SARS-CoV-2 target nucleic acids are NOT DETECTED.  The SARS-CoV-2 RNA is generally detectable in upper respiratory specimens during the acute phase of infection. The lowest concentration of SARS-CoV-2 viral copies this assay can detect is 138 copies/mL. A negative result does not preclude SARS-Cov-2 infection and should not be used as the sole basis for treatment or other patient management decisions. A negative result may occur with  improper specimen collection/handling, submission of specimen other than nasopharyngeal swab, presence of viral mutation(s) within the areas targeted by this assay, and inadequate number of viral copies(<138 copies/mL). A negative result must be combined with clinical observations, patient history, and epidemiological information. The expected result is Negative.  Fact Sheet for Patients:   EntrepreneurPulse.com.au  Fact Sheet for Healthcare Providers:  IncredibleEmployment.be  This test is no t yet approved or cleared by the [place] FDA and  has been authorized for detection and/or diagnosis of SARS-CoV-2 by FDA under an Emergency Use Authorization (EUA). This EUA will remain  in effect (meaning this test can be used) for the duration of the COVID-19 declaration under Section 564(b)(1) of the Act, 21 U.S.C.section 360bbb-3(b)(1), unless the authorization is terminated  or revoked sooner.       Influenza A by PCR NEGATIVE NEGATIVE Final   Influenza B by PCR NEGATIVE NEGATIVE Final    Comment: (NOTE) The Xpert Xpress SARS-CoV-2/FLU/RSV plus  assay is intended as an aid in the diagnosis of influenza from Nasopharyngeal swab specimens and should not be used as a sole basis for treatment. Nasal washings and aspirates are unacceptable for Xpert Xpress SARS-CoV-2/FLU/RSV testing.  Fact Sheet for Patients: EntrepreneurPulse.com.au  Fact Sheet for Healthcare Providers: IncredibleEmployment.be  This test is not yet approved or cleared by the [place] FDA and has been authorized for detection and/or diagnosis of SARS-CoV-2 by FDA under an Emergency Use Authorization (EUA). This EUA will remain in effect (meaning this test can be used) for the duration of the COVID-19 declaration under Section 564(b)(1) of the Act, 21 U.S.C. section 360bbb-3(b)(1), unless the authorization is terminated or revoked.  Performed at [facility], [address] [place], [place] [address]    Imaging CT CHEST ABDOMEN PELVIS W CONTRAST  Result Date: 07/26/2022 CLINICAL DATA:  Chest pain shortness of breath abdomen pain EXAM: CT CHEST, ABDOMEN, AND PELVIS WITH CONTRAST TECHNIQUE: Multidetector CT imaging of the chest, abdomen and pelvis was performed following the standard protocol during bolus  administration of intravenous contrast. RADIATION DOSE REDUCTION: This exam was performed according to the departmental dose-optimization program which includes automated exposure control, adjustment of the mA and/or kV according to patient size and/or use of iterative reconstruction technique. CONTRAST:  110m OMNIPAQUE IOHEXOL 300 MG/ML  SOLN COMPARISON:  Chest x-ray 07/26/2022, CT 04/09/2022, 12/14/2021, chest CT 10/20/2020, 08/16/2020 FINDINGS: CT CHEST FINDINGS Cardiovascular: Right-sided central venous port with tip at the cavoatrial region. Nonaneurysmal aorta. Normal cardiac size. Mild coronary vascular calcification. No pericardial effusion Mediastinum/Nodes: Midline trachea. No thyroid mass. No suspicious mediastinal lymph nodes. Esophagus unremarkable Lungs/Pleura: Previously noted right apical lung mass/masslike consolidation has largely resolved. Linear density extending to the pleural surface with minimal bronchiectasis is now noted in the region. Heterogeneous ground-glass densities and streaky consolidations in the left greater than right lower lobes with lesser involvement of right middle lobe and lingula suspicious for pneumonia. Musculoskeletal: Sternum is intact.  No acute osseous abnormality. CT ABDOMEN PELVIS FINDINGS Hepatobiliary: Vague hypodense liver lesion at the hepatic dome on the right measures about 21 mm compared with 21 mm previously. Additional hypodense mass adjacent to the gallbladder fossa, inferior right hepatic lobe measures 19 by 13 mm, previously 19 x 12 mm. Hyperdense area adherent to the wall of gallbladder, series 2, image 59. No biliary dilatation. Question subtle surface nodularity of the liver. Pancreas: Unremarkable. No pancreatic ductal dilatation or surrounding inflammatory changes. Spleen: Normal in size without focal abnormality. Adrenals/Urinary Tract: Adrenal glands are unremarkable. Kidneys are normal, without renal calculi, focal lesion, or hydronephrosis.  Bladder is unremarkable. Stomach/Bowel: The stomach is nonenlarged. Patient is status post colectomy with ileoanal anastomosis. Mild wall thickening of bowel proximal to the anal anastomosis. Mild gas distension upstream bowel in the pelvis but no convincing obstruction. The remainder of the bowel is nondistended. Vascular/Lymphatic: Nonaneurysmal aorta. Mild atherosclerosis. No suspicious lymph nodes Reproductive: Slightly enlarged prostate Other: Negative for pelvic effusion or free air. Musculoskeletal: No acute osseous abnormality. IMPRESSION: 1. Heterogeneous ground-glass density and streaky consolidations most evident within the left greater than right lower lungs with lesser involvement of right middle lobe and lingula, suspicious for multifocal pneumonia. 2. The previously noted mass or masslike consolidation at the right apex has largely resolved, there is residual linear density with mild bronchiectasis likely due to scarring 3. Status post colectomy with ileoanal anastomosis. Mild wall thickening of the bowel upstream from the anastomosis, correlate for any symptoms of diarrhea. 4. Similar  appearance of hypodense liver metastatic lesions 5. Hyperdensity adherent to the wall of gallbladder, question due to adherent tumefactive sludge or stone disease. Polyp or mass could also be considered. This may be correlated with ultrasound. Electronically Signed   By: Donavan Foil M.D.   On: 07/26/2022 19:16   DG Chest 2 View  Result Date: 07/26/2022 CLINICAL DATA:  Shortness of breath. EXAM: CHEST - 2 VIEW COMPARISON:  Chest two views 10/20/2020 FINDINGS: Right chest wall porta catheter is again seen with the tip overlying the superior vena cava/right atrial junction. Cardiac silhouette and mediastinal contours are within normal limits with mild calcification within aortic arch. Mildly decreased lung volumes. Horizontal linear left costophrenic angle densities are increased from prior, possibly anterior left  lower lobe subsegmental atelectasis versus scarring. No pleural effusion or pneumothorax. Mild-to-moderate multilevel degenerative disc changes of the thoracic spine. Partially visualized ACDF hardware. Left upper abdominal quadrant surgical clips. IMPRESSION: Mildly decreased lung volumes. Increased linear left costophrenic angle densities, possibly subsegmental atelectasis versus scarring. It is difficult to exclude mild pneumonia. Electronically Signed   By: Yvonne Kendall M.D.   On: 07/26/2022 17:14      Time coordinating discharge: over 30 minutes  SIGNED:  Emeterio Reeve DO [facility]

## 2022-07-27 NOTE — Assessment & Plan Note (Addendum)
-   The patient will be placed on supplemental coverage with resistant NovoLog. - We will continue basal coverage. - We will continue Jardiance and hold off metformin.

## 2022-07-27 NOTE — Care Management CC44 (Signed)
Condition Code 44 Documentation Completed  Patient Details  Name: [name] MRN: [id number] Date of Birth: 09-06-1956   Condition Code 44 given:  Yes Patient signature on Condition Code 44 notice:  Yes Documentation of 2 MD's agreement:  Yes Code 44 added to claim:  Yes    Conception Oms, RN 07/27/2022, 4:02 PM

## 2022-07-27 NOTE — Assessment & Plan Note (Addendum)
-   The patient will be admitted to a medical bed. - Sepsis manifested by leukocytosis and tachycardia. - This is secondary to multifocal pneumonia as shown on chest CT. - We will continue antibiotic therapy with IV Rocephin and Zithromax. - We will continue hydration with IV normal saline. - We will follow blood cultures. - Mucolytic therapy will be provided as well as bronchodilator therapy.

## 2022-07-27 NOTE — TOC Progression Note (Signed)
Transition of Care [facility]) - Progression Note    Patient Details  Name: [name] MRN: [id number] Date of Birth: [birth year]  Transition of Care [facility]) CM/SW [place], RN Phone Number: 07/27/2022, 3:25 PM  Clinical Narrative:       Transition of Care (TOC) Screening Note   Patient Details  Name: [name] Date of Birth: August 17, 1956   Transition of Care [facility]) CM/SW Contact:    Conception Oms, RN Phone Number: 07/27/2022, 3:26 PM    Transition of Care Department [facility]) has reviewed patient and no TOC needs have been identified at this time. We will continue to monitor patient advancement through interdisciplinary progression rounds. If new patient transition needs arise, please place a TOC consult.        Expected Discharge Plan and Services           Expected Discharge Date: 07/27/22                                     Social Determinants of Health (SDOH) Interventions    Readmission Risk Interventions     No data to display

## 2022-07-27 NOTE — Progress Notes (Signed)
  Chaplain On-Call responded to Spiritual Care Consult Order from Eugenie Norrie, MD, for Advance Directives information for the patient.  Chaplain met the patient and provided the AD documents and education to him.  Chaplain assured patient of availability for further support if needed.  Chaplain Pollyann Samples M.Div., [facility]

## 2022-08-06 ENCOUNTER — Ambulatory Visit: Admit: 2022-08-06 | Discharge: 2022-08-07 | Payer: MEDICARE

## 2022-08-06 DIAGNOSIS — C8590 Non-Hodgkin lymphoma, unspecified, unspecified site: Principal | ICD-10-CM

## 2022-08-09 ENCOUNTER — Other Ambulatory Visit: Admit: 2022-08-09 | Discharge: 2022-08-10 | Payer: MEDICARE

## 2022-08-09 ENCOUNTER — Ambulatory Visit: Admit: 2022-08-09 | Discharge: 2022-08-10 | Payer: MEDICARE

## 2022-08-09 DIAGNOSIS — C8338 Diffuse large B-cell lymphoma, lymph nodes of multiple sites: Principal | ICD-10-CM

## 2022-08-09 DIAGNOSIS — R059 Cough, unspecified type: Principal | ICD-10-CM

## 2022-08-09 MED ORDER — CODEINE 10 MG-GUAIFENESIN 100 MG/5 ML ORAL LIQUID
Freq: Three times a day (TID) | ORAL | 0 refills | 8 days | Status: CP | PRN
Start: 2022-08-09 — End: ?

## 2022-08-30 ENCOUNTER — Ambulatory Visit: Admit: 2022-08-30 | Discharge: 2022-08-31 | Payer: MEDICARE

## 2022-09-25 DIAGNOSIS — K824 Cholesterolosis of gallbladder: Principal | ICD-10-CM

## 2022-09-25 MED ORDER — LORAZEPAM 1 MG TABLET
ORAL_TABLET | Freq: Three times a day (TID) | ORAL | 0 refills | 1 days | Status: CP | PRN
Start: 2022-09-25 — End: ?

## 2022-10-15 ENCOUNTER — Ambulatory Visit: Admit: 2022-10-15 | Discharge: 2022-10-16 | Payer: MEDICARE

## 2022-10-18 DIAGNOSIS — K828 Other specified diseases of gallbladder: Principal | ICD-10-CM

## 2022-10-26 ENCOUNTER — Ambulatory Visit: Admit: 2022-10-26 | Discharge: 2022-10-27 | Payer: MEDICARE

## 2022-10-26 DIAGNOSIS — E0822 Diabetes mellitus due to underlying condition with diabetic chronic kidney disease: Principal | ICD-10-CM

## 2022-10-26 DIAGNOSIS — K828 Other specified diseases of gallbladder: Principal | ICD-10-CM

## 2022-10-26 DIAGNOSIS — C23 Malignant neoplasm of gallbladder: Principal | ICD-10-CM

## 2022-11-12 ENCOUNTER — Ambulatory Visit: Admit: 2022-11-12 | Discharge: 2022-11-13 | Payer: MEDICARE

## 2022-11-12 DIAGNOSIS — J383 Other diseases of vocal cords: Principal | ICD-10-CM

## 2022-11-12 DIAGNOSIS — Z794 Long term (current) use of insulin: Principal | ICD-10-CM

## 2022-11-12 DIAGNOSIS — S069X0S Unspecified intracranial injury without loss of consciousness, sequela: Principal | ICD-10-CM

## 2022-11-12 DIAGNOSIS — K51919 Ulcerative colitis, unspecified with unspecified complications: Principal | ICD-10-CM

## 2022-11-12 DIAGNOSIS — C8338 Diffuse large B-cell lymphoma, lymph nodes of multiple sites: Principal | ICD-10-CM

## 2022-11-12 DIAGNOSIS — F4310 Post-traumatic stress disorder, unspecified: Principal | ICD-10-CM

## 2022-11-12 DIAGNOSIS — Z01818 Encounter for other preprocedural examination: Principal | ICD-10-CM

## 2022-11-12 DIAGNOSIS — E1140 Type 2 diabetes mellitus with diabetic neuropathy, unspecified: Principal | ICD-10-CM

## 2022-11-13 ENCOUNTER — Encounter: Admit: 2022-11-13 | Discharge: 2022-11-13 | Payer: MEDICARE | Attending: Anesthesiology | Primary: Anesthesiology

## 2022-11-13 ENCOUNTER — Ambulatory Visit: Admit: 2022-11-13 | Discharge: 2022-11-13 | Payer: MEDICARE

## 2022-11-13 MED ORDER — OXYCODONE 5 MG TABLET
ORAL_TABLET | ORAL | 0 refills | 1.00000 days | Status: CP | PRN
Start: 2022-11-13 — End: 2022-11-18
  Filled 2022-11-13: qty 5, 1d supply, fill #0

## 2022-11-22 DIAGNOSIS — C23 Malignant neoplasm of gallbladder: Principal | ICD-10-CM

## 2022-11-22 DIAGNOSIS — G4700 Insomnia, unspecified: Principal | ICD-10-CM

## 2022-11-22 DIAGNOSIS — F410 Panic disorder [episodic paroxysmal anxiety] without agoraphobia: Principal | ICD-10-CM

## 2022-11-23 ENCOUNTER — Ambulatory Visit: Admit: 2022-11-23 | Discharge: 2022-11-24 | Payer: MEDICARE

## 2022-11-23 DIAGNOSIS — K8301 Primary sclerosing cholangitis: Principal | ICD-10-CM

## 2022-11-23 DIAGNOSIS — C23 Malignant neoplasm of gallbladder: Principal | ICD-10-CM

## 2022-11-23 DIAGNOSIS — C260 Malignant neoplasm of intestinal tract, part unspecified: Principal | ICD-10-CM

## 2022-11-26 ENCOUNTER — Ambulatory Visit: Admit: 2022-11-26 | Discharge: 2022-11-27 | Payer: MEDICARE

## 2022-11-26 DIAGNOSIS — G4700 Insomnia, unspecified: Principal | ICD-10-CM

## 2022-11-26 DIAGNOSIS — C8338 Diffuse large B-cell lymphoma, lymph nodes of multiple sites: Principal | ICD-10-CM

## 2022-11-26 DIAGNOSIS — F419 Anxiety disorder, unspecified: Principal | ICD-10-CM

## 2022-11-26 DIAGNOSIS — F4310 Post-traumatic stress disorder, unspecified: Principal | ICD-10-CM

## 2022-11-26 MED ORDER — VENLAFAXINE ER 150 MG CAPSULE,EXTENDED RELEASE 24 HR
ORAL_CAPSULE | Freq: Every day | ORAL | 3 refills | 90 days | Status: CP
Start: 2022-11-26 — End: ?

## 2022-11-26 MED ORDER — TRAZODONE 100 MG TABLET
ORAL_TABLET | Freq: Every evening | ORAL | 1 refills | 30 days | Status: CP | PRN
Start: 2022-11-26 — End: 2023-01-25

## 2022-11-26 MED ORDER — QUETIAPINE 100 MG TABLET
ORAL_TABLET | Freq: Every evening | ORAL | 1 refills | 30 days | Status: CP | PRN
Start: 2022-11-26 — End: 2023-01-25

## 2022-11-28 ENCOUNTER — Ambulatory Visit: Admit: 2022-11-28 | Discharge: 2022-11-29 | Payer: MEDICARE

## 2022-11-28 IMAGING — CT CT ABD-PELV W/ CM
2 of 5 series · 15 of 46 positions shown, 17 images · IV contrast (agent unspecified)
Comparison: 10/20/2020, 04/05/2020, 07/27/2020

CLINICAL DATA: Right lower quadrant pain radiating to back, history
of lymphoma

EXAM:
CT ABDOMEN AND PELVIS WITH CONTRAST
TECHNIQUE: Multidetector CT imaging of the abdomen and pelvis was performed
using the standard protocol following bolus administration of
intravenous contrast.

[Series 2: abdomen 5.0 · axial · 0.83mm/px · z∈[-1010,-555]mm · 12 of 107 slices shown, 14 images]
[im 8/107  soft-tissue]
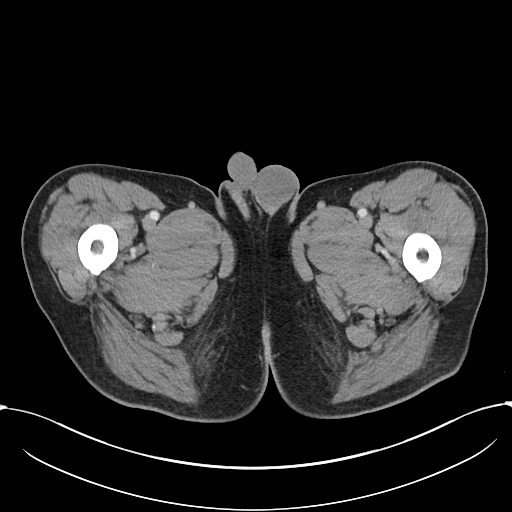
[im 8/107  bone]
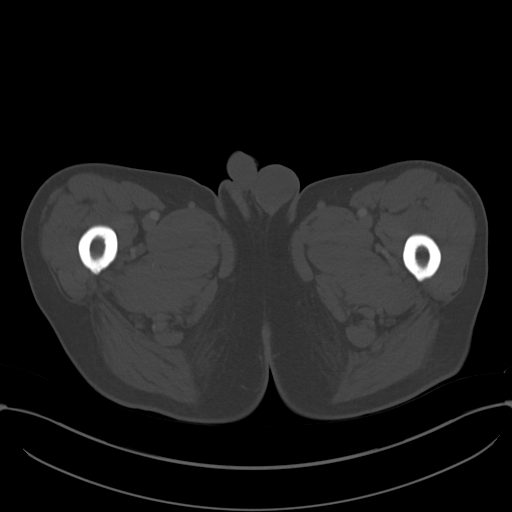
[im 16/107  soft-tissue]
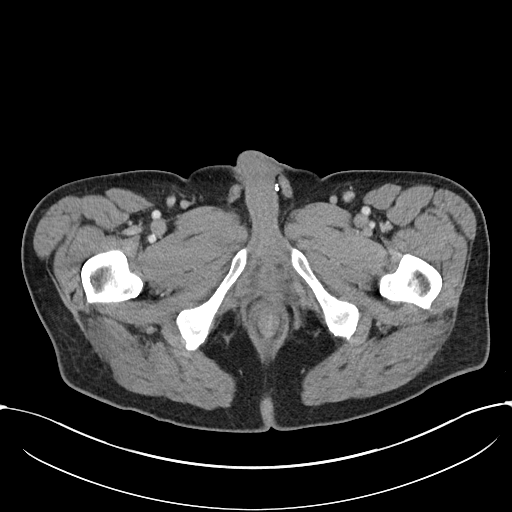
[im 23/107  soft-tissue]
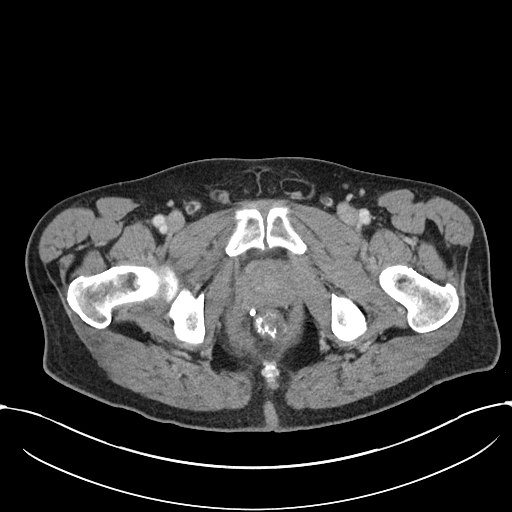
[im 31/107  soft-tissue]
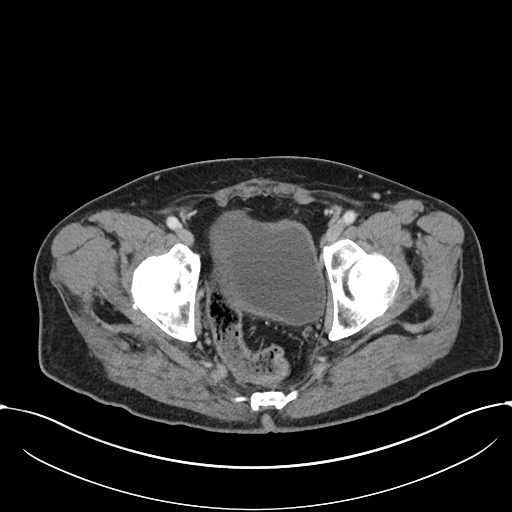
[im 38/107  soft-tissue]
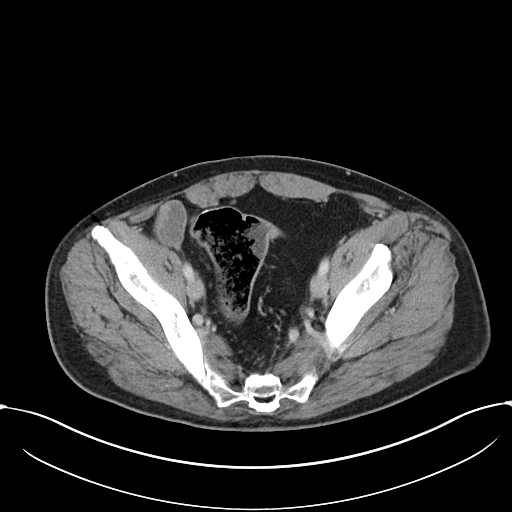
[im 46/107  soft-tissue]
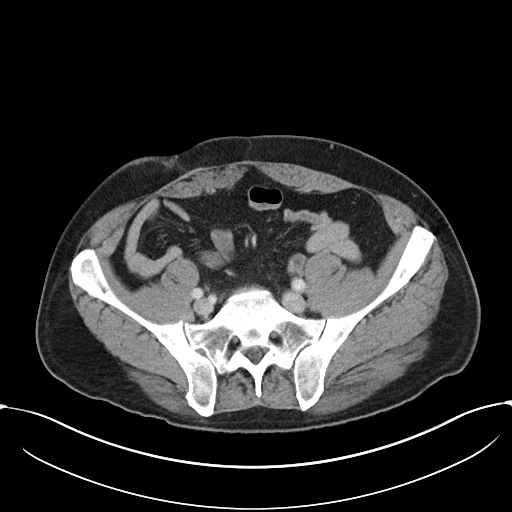
[im 61/107  soft-tissue]
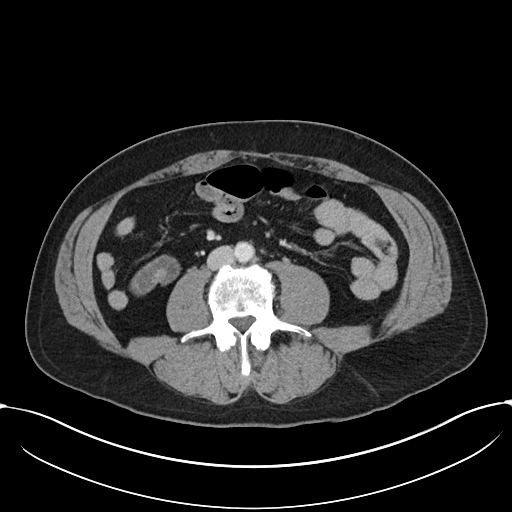
[im 69/107  soft-tissue]
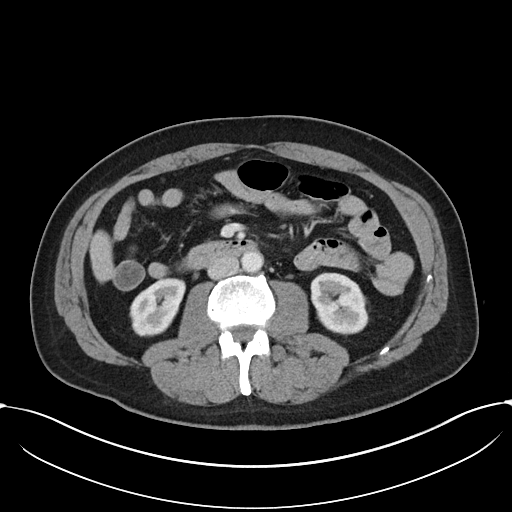
[im 76/107  soft-tissue]
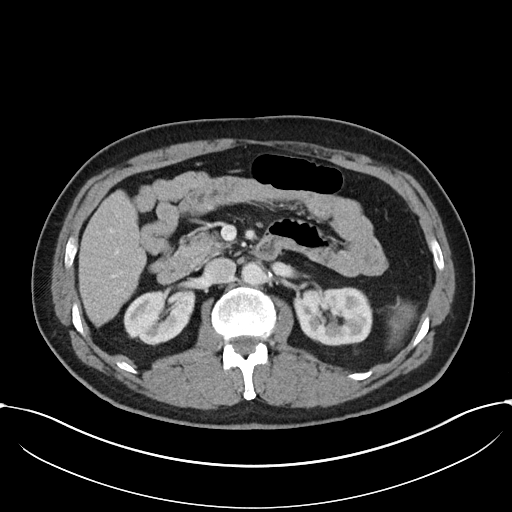
[im 76/107  bone]
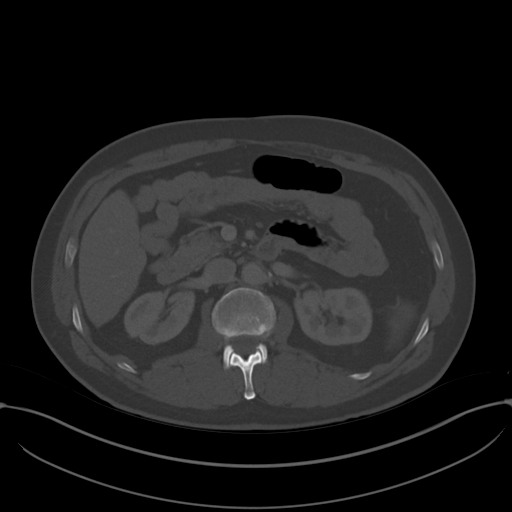
[im 84/107  soft-tissue]
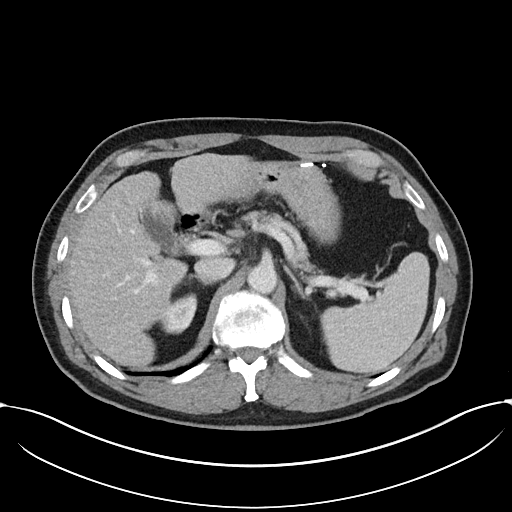
[im 91/107  soft-tissue]
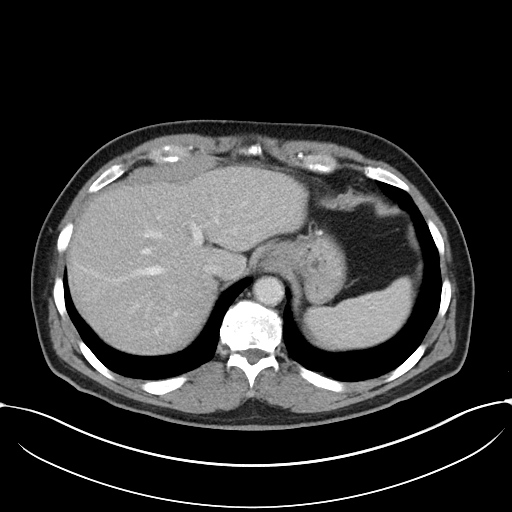
[im 99/107  soft-tissue]
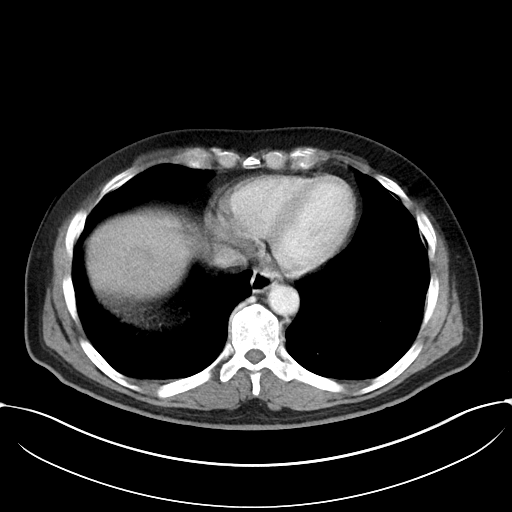

[Series 5: abdomen 3.0 mpr cor · coronal · 0.79mm/px · 3 of 84 slices shown]
[im 28/84  soft-tissue]
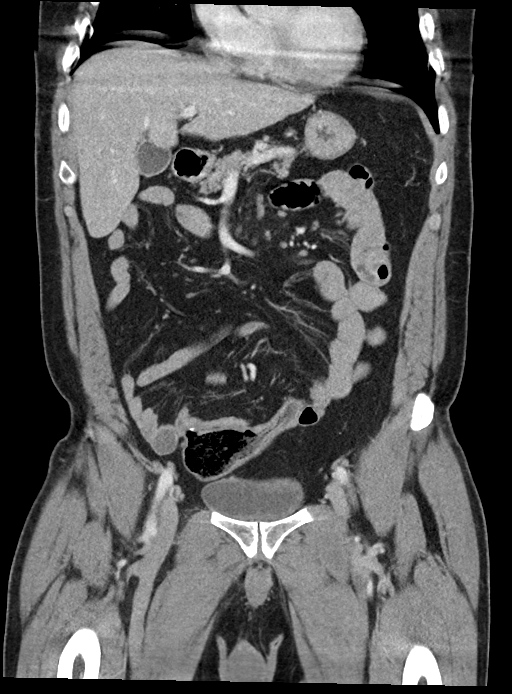
[im 37/84  soft-tissue]
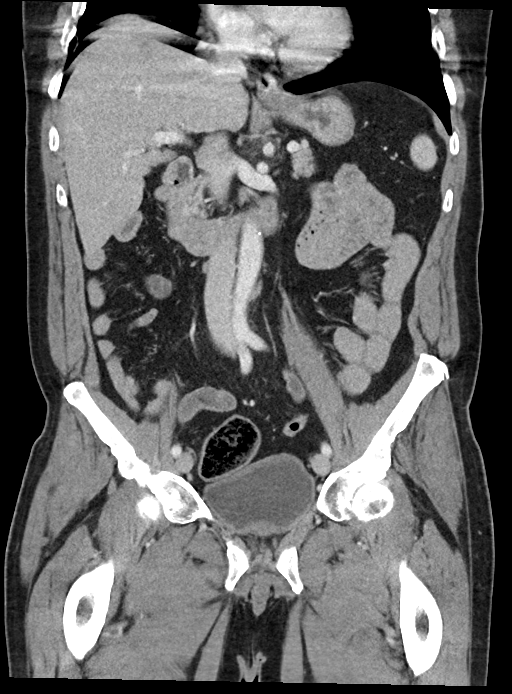
[im 47/84  soft-tissue]
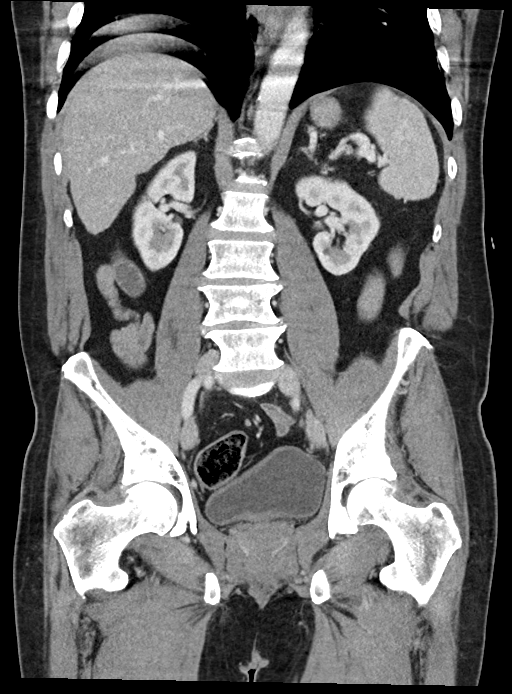

[15 of 46 positions shown; findings below may reference images not displayed]

RADIATION DOSE REDUCTION: This exam was performed according to the
departmental dose-optimization program which includes automated
exposure control, adjustment of the mA and/or kV according to
patient size and/or use of iterative reconstruction technique.

CONTRAST:  100mL OMNIPAQUE IOHEXOL 300 MG/ML  SOLN
FINDINGS: Lower chest: Respiratory motion limits evaluation. No acute pleural
or parenchymal lung disease.

Hepatobiliary: Evaluation of the known metastatic lesion within the
dome of the right lobe liver is limited due to extensive respiratory
motion in this region, and measures approximately 1.7 x 0.9 cm
reference image [DATE]. A second hypodense lesion within the inferior
aspect right lobe liver segment 5 reference image [DATE] now measures
1.9 x 1.2 cm, previously having measured approximately 3.4 x 2.9 cm
by my measurements on image 37/2 of the 07/27/2020 exam.

There are no new lesions within the liver. No intrahepatic biliary
duct dilation. High attenuation material within the gallbladder
lumen may reflect tumefactive sludge or noncalcified gallstone. No
gallbladder wall thickening or pericholecystic inflammation to
suggest cholecystitis.

Pancreas: Unremarkable. No pancreatic ductal dilatation or
surrounding inflammatory changes.

Spleen: Normal in size without focal abnormality.

Adrenals/Urinary Tract: Adrenal glands are unremarkable. Kidneys are
normal, without renal calculi, focal lesion, or hydronephrosis.
Bladder is unremarkable.

Stomach/Bowel: Postsurgical changes from subtotal colectomy. No
bowel obstruction or ileus. No bowel wall thickening or inflammatory
change.

Vascular/Lymphatic: No significant vascular findings are present. No
enlarged abdominal or pelvic lymph nodes.

Reproductive: Prostate is unremarkable.

Other: No free fluid or free intraperitoneal gas. No abdominal wall
hernia.

Musculoskeletal: No acute or destructive bony lesions. Reconstructed
images demonstrate no additional findings. Stable lumbar
degenerative changes.
IMPRESSION: 1. Decreased size of known liver metastases.
2. Gallbladder sludge versus noncalcified gallstone. No evidence of
acute cholecystitis.
3. Otherwise no acute intra-abdominal or intrapelvic process.

## 2022-11-30 ENCOUNTER — Emergency Department
Admit: 2022-11-30 | Discharge: 2022-12-01 | Disposition: A | Discharge status: Home / Self Care | Payer: MEDICARE | Attending: Emergency Medicine

## 2022-11-30 ENCOUNTER — Ambulatory Visit: Admit: 2022-11-30 | Discharge: 2022-12-01 | Disposition: A | Discharge status: Home / Self Care | Payer: MEDICARE

## 2022-11-30 ENCOUNTER — Ambulatory Visit
Admit: 2022-11-30 | Discharge: 2022-12-01 | Disposition: A | Discharge status: Home / Self Care | Payer: MEDICARE | Attending: Emergency Medicine

## 2022-12-01 MED ORDER — CEPHALEXIN 500 MG CAPSULE
ORAL_CAPSULE | Freq: Four times a day (QID) | ORAL | 0 refills | 10 days | Status: CP
Start: 2022-12-01 — End: 2022-12-11

## 2022-12-01 MED ORDER — DOXYCYCLINE HYCLATE 100 MG CAPSULE
ORAL_CAPSULE | Freq: Two times a day (BID) | ORAL | 0 refills | 10 days | Status: CP
Start: 2022-12-01 — End: 2022-12-11

## 2022-12-07 ENCOUNTER — Ambulatory Visit: Admit: 2022-12-07 | Discharge: 2022-12-08 | Payer: MEDICARE

## 2022-12-07 DIAGNOSIS — C23 Malignant neoplasm of gallbladder: Principal | ICD-10-CM

## 2022-12-21 ENCOUNTER — Ambulatory Visit: Admit: 2022-12-21 | Discharge: 2022-12-22 | Payer: MEDICARE

## 2022-12-21 DIAGNOSIS — K769 Liver disease, unspecified: Principal | ICD-10-CM

## 2022-12-21 DIAGNOSIS — C23 Malignant neoplasm of gallbladder: Principal | ICD-10-CM

## 2022-12-24 ENCOUNTER — Encounter: Admit: 2022-12-24 | Discharge: 2022-12-25 | Payer: MEDICARE | Attending: Anesthesiology | Primary: Anesthesiology

## 2022-12-24 ENCOUNTER — Ambulatory Visit: Admit: 2022-12-24 | Discharge: 2022-12-25 | Payer: MEDICARE

## 2022-12-24 DIAGNOSIS — Z01818 Encounter for other preprocedural examination: Principal | ICD-10-CM

## 2022-12-26 ENCOUNTER — Ambulatory Visit: Admit: 2022-12-26 | Discharge: 2022-12-29 | Disposition: A | Discharge status: Home / Self Care | Payer: MEDICARE

## 2022-12-26 ENCOUNTER — Encounter: Admit: 2022-12-26 | Discharge: 2022-12-29 | Disposition: A | Discharge status: Home / Self Care | Payer: MEDICARE

## 2022-12-29 MED ORDER — ACETAMINOPHEN 500 MG TABLET
ORAL_TABLET | Freq: Three times a day (TID) | ORAL | 0 refills | 15.00000 days | Status: CP
Start: 2022-12-29 — End: ?
  Filled 2022-12-29: qty 90, 15d supply, fill #0

## 2022-12-29 MED ORDER — GABAPENTIN 300 MG CAPSULE
ORAL_CAPSULE | Freq: Three times a day (TID) | ORAL | 0 refills | 30.00000 days | Status: CP
Start: 2022-12-29 — End: 2023-01-28
  Filled 2022-12-29: qty 90, 30d supply, fill #0

## 2022-12-29 MED ORDER — PEN NEEDLE, DIABETIC 32 GAUGE X 5/32" (4 MM)
0 refills | 0.00000 days | Status: CP
Start: 2022-12-29 — End: ?
  Filled 2022-12-29: qty 15, 15d supply, fill #0

## 2022-12-29 MED ORDER — LIDOCAINE 4 % TOPICAL PATCH
MEDICATED_PATCH | Freq: Every day | TRANSDERMAL | 0 refills | 15.00000 days | Status: CP | PRN
Start: 2022-12-29 — End: ?

## 2022-12-29 MED ORDER — INSULIN GLARGINE (U-100) 100 UNIT/ML (3 ML) SUBCUTANEOUS PEN
Freq: Every evening | SUBCUTANEOUS | 0 refills | 100.00000 days | Status: CP
Start: 2022-12-29 — End: 2023-04-08
  Filled 2022-12-29: qty 15, 100d supply, fill #0

## 2022-12-29 MED ORDER — OXYCODONE 10 MG TABLET
ORAL_TABLET | ORAL | 0 refills | 4.00000 days | Status: CP | PRN
Start: 2022-12-29 — End: 2023-01-03
  Filled 2022-12-29: qty 20, 4d supply, fill #0

## 2022-12-29 MED FILL — SURE COMFORT PEN NEEDLE 32 GAUGE X 5/32" (4 MM): 25 days supply | Qty: 100 | Fill #0

## 2022-12-30 MED ORDER — ENOXAPARIN 40 MG/0.4 ML SUBCUTANEOUS SYRINGE
SUBCUTANEOUS | 0 refills | 28.00000 days | Status: CP
Start: 2022-12-30 — End: ?
  Filled 2022-12-29: qty 11.2, 28d supply, fill #0

## 2023-01-10 ENCOUNTER — Ambulatory Visit: Admit: 2023-01-10 | Discharge: 2023-01-11 | Payer: MEDICARE | Attending: Gastroenterology | Primary: Gastroenterology

## 2023-01-10 DIAGNOSIS — E611 Iron deficiency: Principal | ICD-10-CM

## 2023-01-10 DIAGNOSIS — K91850 Pouchitis: Principal | ICD-10-CM

## 2023-01-10 DIAGNOSIS — E559 Vitamin D deficiency, unspecified: Principal | ICD-10-CM

## 2023-01-10 MED ORDER — CEFDINIR 300 MG CAPSULE
ORAL_CAPSULE | Freq: Two times a day (BID) | ORAL | 1 refills | 30 days | Status: CP
Start: 2023-01-10 — End: 2023-02-09

## 2023-01-11 ENCOUNTER — Ambulatory Visit: Admit: 2023-01-11 | Discharge: 2023-01-12 | Payer: MEDICARE

## 2023-01-11 DIAGNOSIS — C23 Malignant neoplasm of gallbladder: Principal | ICD-10-CM

## 2023-01-21 ENCOUNTER — Ambulatory Visit: Admit: 2023-01-21 | Discharge: 2023-01-22 | Payer: MEDICARE

## 2023-01-21 DIAGNOSIS — F419 Anxiety disorder, unspecified: Principal | ICD-10-CM

## 2023-01-21 DIAGNOSIS — F4310 Post-traumatic stress disorder, unspecified: Principal | ICD-10-CM

## 2023-01-21 DIAGNOSIS — G4700 Insomnia, unspecified: Principal | ICD-10-CM

## 2023-01-21 MED ORDER — QUETIAPINE 100 MG TABLET
ORAL_TABLET | Freq: Every evening | ORAL | 3 refills | 90.00000 days | Status: CP | PRN
Start: 2023-01-21 — End: 2024-01-21

## 2023-01-21 MED ORDER — HYDROXYZINE HCL 25 MG TABLET
ORAL_TABLET | Freq: Every evening | ORAL | 11 refills | 0.00000 days | Status: CP | PRN
Start: 2023-01-21 — End: ?

## 2023-01-24 DIAGNOSIS — E1140 Type 2 diabetes mellitus with diabetic neuropathy, unspecified: Principal | ICD-10-CM

## 2023-01-24 DIAGNOSIS — Z794 Long term (current) use of insulin: Principal | ICD-10-CM

## 2023-01-30 MED ORDER — JARDIANCE 10 MG TABLET
ORAL_TABLET | Freq: Every day | ORAL | 3 refills | 90 days
Start: 2023-01-30 — End: ?

## 2023-01-31 ENCOUNTER — Ambulatory Visit: Admit: 2023-01-31 | Discharge: 2023-02-01 | Payer: MEDICARE

## 2023-01-31 DIAGNOSIS — Z79899 Other long term (current) drug therapy: Principal | ICD-10-CM

## 2023-01-31 DIAGNOSIS — C23 Malignant neoplasm of gallbladder: Principal | ICD-10-CM

## 2023-02-04 DIAGNOSIS — C23 Malignant neoplasm of gallbladder: Principal | ICD-10-CM

## 2023-02-04 DIAGNOSIS — Z79899 Other long term (current) drug therapy: Principal | ICD-10-CM

## 2023-02-05 NOTE — Unmapped (Signed)
[facility] Specialty Medication Onboarding    Specialty Medication: Capecitabine  Prior Authorization: Not Required   Financial Assistance: No - copay  <$25  Final Copay/Day Supply: $25 / 21    Insurance Restrictions: None     Notes to Pharmacist:   Credit Card on File: no    The triage team has completed the benefits investigation and has determined that the patient is able to fill this medication at [facility]. Please contact the patient to complete the onboarding or follow up with the prescribing physician as needed.

## 2023-02-07 DIAGNOSIS — C8338 Diffuse large B-cell lymphoma, lymph nodes of multiple sites: Principal | ICD-10-CM

## 2023-02-08 ENCOUNTER — Ambulatory Visit: Admit: 2023-02-08 | Discharge: 2023-02-08 | Payer: MEDICARE

## 2023-02-08 ENCOUNTER — Other Ambulatory Visit: Admit: 2023-02-08 | Discharge: 2023-02-08 | Payer: MEDICARE

## 2023-02-08 DIAGNOSIS — I951 Orthostatic hypotension: Principal | ICD-10-CM

## 2023-02-08 DIAGNOSIS — E1140 Type 2 diabetes mellitus with diabetic neuropathy, unspecified: Principal | ICD-10-CM

## 2023-02-08 DIAGNOSIS — C8338 Diffuse large B-cell lymphoma, lymph nodes of multiple sites: Principal | ICD-10-CM

## 2023-02-08 DIAGNOSIS — R059 Cough, unspecified type: Principal | ICD-10-CM

## 2023-02-08 DIAGNOSIS — Z79899 Other long term (current) drug therapy: Principal | ICD-10-CM

## 2023-02-08 DIAGNOSIS — C23 Malignant neoplasm of gallbladder: Principal | ICD-10-CM

## 2023-02-08 DIAGNOSIS — Z794 Long term (current) use of insulin: Principal | ICD-10-CM

## 2023-02-08 LAB — CBC W/ AUTO DIFF
BASOPHILS ABSOLUTE COUNT: 0.1 10*9/L (ref 0.0–0.1)
BASOPHILS RELATIVE PERCENT: 1.5 %
EOSINOPHILS ABSOLUTE COUNT: 0.4 10*9/L (ref 0.0–0.5)
EOSINOPHILS RELATIVE PERCENT: 6.9 %
HEMATOCRIT: 35 % — ABNORMAL LOW (ref 39.0–48.0)
HEMOGLOBIN: 12.4 g/dL — ABNORMAL LOW (ref 12.9–16.5)
LYMPHOCYTES ABSOLUTE COUNT: 1.1 10*9/L (ref 1.1–3.6)
LYMPHOCYTES RELATIVE PERCENT: 19.9 %
MEAN CORPUSCULAR HEMOGLOBIN CONC: 35.6 g/dL (ref 32.0–36.0)
MEAN CORPUSCULAR HEMOGLOBIN: 29 pg (ref 25.9–32.4)
MEAN CORPUSCULAR VOLUME: 81.5 fL (ref 77.6–95.7)
MEAN PLATELET VOLUME: 7.4 fL (ref 6.8–10.7)
MONOCYTES ABSOLUTE COUNT: 0.4 10*9/L (ref 0.3–0.8)
MONOCYTES RELATIVE PERCENT: 7.9 %
NEUTROPHILS ABSOLUTE COUNT: 3.4 10*9/L (ref 1.8–7.8)
NEUTROPHILS RELATIVE PERCENT: 63.8 %
PLATELET COUNT: 153 10*9/L (ref 150–450)
RED BLOOD CELL COUNT: 4.29 10*12/L (ref 4.26–5.60)
RED CELL DISTRIBUTION WIDTH: 14.8 % (ref 12.2–15.2)
WBC ADJUSTED: 5.4 10*9/L (ref 3.6–11.2)

## 2023-02-08 NOTE — Unmapped (Unsigned)
Port accessed by RN Patrick Rodriguez, labs drawn and sent. Port flushed, heparin-locked, and de-accessed.

## 2023-02-08 NOTE — Unmapped (Signed)
Addended by: Clarene Critchley on: 02/08/2023 09:41 AM     Modules accepted: Orders

## 2023-02-08 NOTE — Unmapped (Signed)
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   *R-CHOP with GCSF support - dose reduced 50% significantly because of the toxicity Keith Ballard had during epoch that made me wonder if there were some mutations impacting drug metabolism and because of severe baseline neuropathy, which have been rarely reported to be associated with more mucositis in chemotherapy; received a total of 5 cycles of chemo (4 rchop and 1 r epoch) last dose on 10/06/20.  Not given cycle 6 of chemo because of covid infection and just really sick from it. --> complete remission on PET scan 03/16/21 and repeat CT c/a/p 07/2022  * post treatment echo 12/2021 showed LVEF 60%    There is no evidence of disease relapse based on physical exam, labs and lack of symptoms.  I won't continue to get any scans on him unless there is a clinical indicatiln, but I suspect Keith Ballard will be getting occasional scans to follow up on his adenocarcinoma. His CBC is overall normal other than some mild anemia - no intervention required.     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  Followed by Dr. Timoteo Gaul at [facility] Plan to start xeloda in June.    OTHER ISSUES:   Ulcerative colitis s/p colectomy with chronic diarrhea - no intervention required at this time.     Hx of HTN and hx of orthostatic hypotension- Keith Ballard has been on anti-hypertensives in the past. However developed low blood pressure during cancer diagnosis and treatment.  This was managed with midodrine which was weaned. Keith Ballard is feeling dizzy when Keith Ballard stands up so we wiill check orthostatics today and his SBP dropped to 77.  This is a chronic prolem so I don't think Keith Ballard needs an acute intensive work up now but may need to be on midoodrin or something chronically. I have called his PCP, Dr. Neita Goodnight, and they can move forward with management.     Diabetes - managed by his PCP    PTSD: managed by his PCP. Keith Ballard doesn't feel like Keith Ballard neeeds a therapist at this time.       Clarene Critchley, MD  Associate Professor  Division of Hematology    ============================      INTERVAL VISIT:  Since I saw him last hee has had his gallbladder removed and partial hepatectomy for diagnosis and management of a new adenocarcinoma.  Keith Ballard is healing well from the surgery.  I have discussed his case with Dr. Timoteo Gaul.    His most bothersome symptom is that Keith Ballard gets dizzy when Keith Ballard is walking around. For exercise Keith Ballard walks around [employer] pushing a grocery cart for support.    His neuropathy in his fingertips is not bothering him. Keith Ballard still has a hoarse voice.    MEDICATIONS:  Current Outpatient Medications   Medication Sig Dispense Refill    acetaminophen (TYLENOL) 500 MG tablet Take 2 tablets (1,000 mg total) by mouth every eight (8) hours. 90 tablet 0    [START ON 02/28/2023] capecitabine (XELODA) 500 MG tablet Take 3 tablets (1,500 mg total) by mouth every morning AND 4 tablets (2,000 mg total) every evening. Only take on days 1 through 14 of each 21-day  cycle. Take with full glass of water after a meal, within 30 minutes. Swallow tablets whole; do not cut or crush. 98 tablet 7    cefdinir (OMNICEF) 300 MG capsule Take 1 capsule (300 mg total) by mouth two (2) times a day. 60 capsule 1    enoxaparin (LOVENOX) 40 mg/0.4 mL Syrg Inject 0.4 mL (40 mg total) under the skin daily. 11.2 mL 0    hydrOXYzine (ATARAX) 25 MG tablet Take one or two tablets at bedtime as needed for anxiety or sleep 60 tablet 11    insulin glargine (BASAGLAR, LANTUS) 100 unit/mL (3 mL) injection pen Inject 0.15 mL (15 Units total) under the skin nightly. 15 mL 0    JARDIANCE 10 mg tablet TAKE ONE TABLET BY MOUTH DAILY AT 9AM FOR KIDNEY PROTECTION      lidocaine 4 % patch Place 1 patch on the skin daily as needed (pain). Apply for 12 hours then remove for 12 hours. 15 patch 0    MELATONIN, BULK, MISC by Miscellaneous route.      metFORMIN (GLUCOPHAGE) 500 MG tablet Take 1 tablet (500 mg total) by mouth in the morning and 1 tablet (500 mg total) in the evening. Take with meals. 60 tablet 0    omeprazole (PRILOSEC) 40 MG capsule       pen needle, diabetic 32 gauge x 5/32 (4 mm) Ndle Use with insulin up to 4 times/day as needed. 100 each 0    QUEtiapine (SEROQUEL) 100 MG tablet Take 1 tablet (100 mg total) by mouth nightly as needed (sleep and anxiety). 90 tablet 3    venlafaxine (EFFEXOR-XR) 150 MG 24 hr capsule Take 1 capsule (150 mg total) by mouth daily. 90 capsule 3    gabapentin (NEURONTIN) 300 MG capsule Take 1 capsule (300 mg total) by mouth Three (3) times a day. (Patient not taking: Reported on 01/31/2023) 90 capsule 0     No current facility-administered medications for this visit.     VITAL SIGNS:   Vitals:    02/08/23 0825   BP: 122/72   Pulse: 89   Resp: 17   Temp: 36.4 ??C (97.5 ??F)   TempSrc: Temporal   SpO2: 100%   Weight: 75.4 kg (166 lb 3.6 oz)         EXAM:  Physical Exam  Constitutional:       Appearance: Keith Ballard is not ill-appearing.   Eyes:      General: No scleral icterus.  Cardiovascular:      Rate and Rhythm: Regular rhythm.      Heart sounds: Normal heart sounds.   Pulmonary:      Effort: No respiratory distress.      Breath sounds: No rhonchi.   Abdominal:      Palpations: Abdomen is soft.      Tenderness: There is no abdominal tenderness.   Musculoskeletal:         General: No swelling or deformity.   Lymphadenopathy:      Cervical: No cervical adenopathy.      Upper Body:      Right upper body: No supraclavicular or axillary adenopathy.      Left upper body: No supraclavicular or axillary adenopathy.   Skin:     General: Skin is warm and dry.      Findings: No rash.   Neurological:      Mental Status: Keith Ballard is alert.   Psychiatric:         Mood and Affect:  Mood normal.         Thought Content: Thought content normal.           LABORATORY:  Lab on 02/08/2023   Component Date Value Ref Range Status    WBC 02/08/2023 5.4  3.6 - 11.2 10*9/L Final    RBC 02/08/2023 4.29  4.26 - 5.60 10*12/L Final    HGB 02/08/2023 12.4 (L)  12.9 - 16.5 g/dL Final    HCT 62/13/0865 35.0 (L)  39.0 - 48.0 % Final    MCV 02/08/2023 81.5  77.6 - 95.7 fL Final    MCH 02/08/2023 29.0  25.9 - 32.4 pg Final    MCHC 02/08/2023 35.6  32.0 - 36.0 g/dL Final    RDW 78/46/9629 14.8  12.2 - 15.2 % Final    MPV 02/08/2023 7.4  6.8 - 10.7 fL Final    Platelet 02/08/2023 153  150 - 450 10*9/L Final    Neutrophils % 02/08/2023 63.8  % Final    Lymphocytes % 02/08/2023 19.9  % Final    Monocytes % 02/08/2023 7.9  % Final    Eosinophils % 02/08/2023 6.9  % Final    Basophils % 02/08/2023 1.5  % Final    Absolute Neutrophils 02/08/2023 3.4  1.8 - 7.8 10*9/L Final    Absolute Lymphocytes 02/08/2023 1.1  1.1 - 3.6 10*9/L Final    Absolute Monocytes 02/08/2023 0.4  0.3 - 0.8 10*9/L Final    Absolute Eosinophils 02/08/2023 0.4  0.0 - 0.5 10*9/L Final    Absolute Basophils 02/08/2023 0.1  0.0 - 0.1 10*9/L Final

## 2023-02-13 NOTE — Unmapped (Signed)
Ke[facility] Ballard GASTROENTEROLOGY Follow-up VISIT      REFERRING PROVIDER:  Gae Bon, MD  [address]  [id number]  [place],  [place] [address]    PRIMARY CARE PROVIDER:  Gae Bon, MD        HISTORY OF PRESENT ILLNESS: This is a 67 y.o. year old male with history of ulcerative colitis initially diagnosed in 1988 and complicated by high grade dysplasia for which he underwent total abdominal colectomy and IPAA in 1996/1997 in two stages.    Patient was lost to follow-up for the last 3 years.    Currently the patient is recovering from his second open liver surgery beginning of April after initial surgery for gallbladder cancer in February 2024.  He is getting stronger, not sure if cefdinir helped. Loperamide 2 tablet bid are somewhat helping .  Still increased  bowel frequency approx. 1/hour.  Stabilized  body weight.      Bowel frequency /24 hours  10-12  Bowel frequency during night 1-2  Blood in bowel movements none  Abdominal pain  None = 0  Bloating rarely  Incontinence 1-2 month  Bodyweight decreasing  Loperamide No  Lomotil No  Hyoscyamine:  No  Antibiotic none       PROBLEMS:  Patient Active Problem List   Diagnosis    Obstructive sleep apnea    Posterior subcapsular polar senile cataract    Intestinal bypass or anastomosis status    Senile nuclear sclerosis    UC (ulcerative colitis) (CMS-HCC)    Vocal process granuloma    Vocal fold scar    Periodic limb movement disorder (PLMD)    Pouchitis (CMS-HCC)    Panic attack    Onychomycosis    Type 2 diabetes mellitus, with long-term current use of insulin (CMS-HCC)    Vasculogenic erectile dysfunction    TBI (traumatic brain injury) (CMS-HCC)    Right knee pain    PTSD (post-traumatic stress disorder)    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     PAST MEDICAL HISTORY:    Past Medical History:   Diagnosis Date    Acute kidney failure (CMS-HCC) 05/24/2020    Anxiety     Arthritis     Cataract     Colon cancer (CMS-HCC)     s/p colectomy    Depression     Essential hypertension 09/04/2016    GERD (gastroesophageal reflux disease)     Left hip pain 09/12/2015    Peptic ulceration     PTSD (post-traumatic stress disorder)     Risk for falls 01/15/2023    Sepsis (CMS-HCC) 06/12/2020    Sleep apnea     Sleep apnea 2015    Strep throat     Type 2 diabetes mellitus without complication (CMS-HCC) 08/02/2015    UC (ulcerative colitis) (CMS-HCC)        PAST SURGICAL HISTORY:    Past Surgical History:   Procedure Laterality Date    CARPAL  TUNNEL RELEASE      CERVICAL SPINE SURGERY      COLECTOMY      COLECTOMY  1997    FRACTURE SURGERY      IR INSERT G-TUBE PERCUTANEOUS  07/01/2020    IR INSERT G-TUBE PERCUTANEOUS 07/01/2020 Maree Erie, MD IMG VIR [facility]    IR INSERT PORT AGE GREATER THAN 5 YRS  06/03/2020    IR INSERT PORT AGE GREATER THAN 5 YRS 06/03/2020 Andres Labrum, MD IMG VIR [facility]    KNEE CARTILAGE SURGERY      NECK SURGERY      PR LAP,CHOLECYSTECTOMY N/A 11/13/2022    Procedure: LAPAROSCOPY, SURGICAL; CHOLECYSTECTOMY;  Surgeon: Marion Downer, MD;  Location: MAIN OR [facility];  Service: Surgical Oncology    PR LARYNGOSCOPY,DIRECT,DX,OP MICROSCOP N/A 09/01/2021    Procedure: LARYNGOSCOPY DIRECT WITH OR WITHOUT TRACHEOSCPY; DIAGNOSTIC, WITH OPERATING MICROSCOPE OR TELESCOPE;  Surgeon: Hardie Pulley, MD;  Location: ASC OR [facility];  Service: ENT    PR LARYNGOSCOPY,DIRECT,SCOPE,INJ CORDS N/A 09/01/2021    Procedure: LARYNGOSCOPY, DIRECT, WITH INJECTION INTO VOCAL CORD(S), THERAPEUTIC; W/OPERATING MICROSCOPE OR TELESCOPE;  Surgeon: Hardie Pulley, MD;  Location: ASC OR [facility];  Service: ENT    PR LYSIS ADNEXAL ADHESIONS N/A 12/26/2022    Procedure: LYSIS OF ADHESIONS (SALPINGOLYSIS, OVARIOLYSIS);  Surgeon: Marion Downer, MD;  Location: MAIN OR [facility];  Service: Surgical Oncology    PR NDSC EVAL INTSTINAL POUCH DX W/COLLJ SPEC SPX N/A 05/13/2020    Procedure: ENDO EVAL SM INTEST POUCH; DX;  Surgeon: Modena Nunnery, MD;  Location: GI PROCEDURES [facility];  Service: Gastroenterology    PR NDSC EVAL INTSTINAL POUCH W/BX SINGLE/MULTIPLE N/A 08/04/2014    Procedure: ENDOSCOPIC EVAL OF SMALL INTESTINAL POUCH; DIAGNOSTIC, WITH BIOPSY;  Surgeon: Trula Slade, MD;  Location: [facility]URES [facility];  Service: Gastroenterology    PR NDSC EVAL INTSTINAL POUCH W/BX SINGLE/MULTIPLE N/A 06/06/2016    Procedure: ENDOSCOPIC EVAL OF SMALL INTESTINAL POUCH; DIAGNOSTIC, WITH BIOPSY;  Surgeon: Modena Nunnery, MD;  Location: GI PROCEDURES [facility];  Service: Gastroenterology    PR REMOVE ABD LYMPH NODES RAD REGNL N/A 12/26/2022    Procedure: ABDOMINAL LYMPHADENECT, REGION, INCLUD CELIAC/GASTRIC/PORTAL/PERIPANCREAT, W/WO PARA-AORT/VENA CAVAL NODES;  Surgeon: Marion Downer, MD;  Location: MAIN OR [facility];  Service: Surgical Oncology    PR RESEC LIVER,PART LOBECTOMY N/A 12/26/2022    Procedure: HEPATECTOMY, RESECTION OF LIVER; PARTIAL LOBECTOMY;  Surgeon: Marion Downer, MD;  Location: MAIN OR [facility];  Service: Surgical Oncology    PR SIGMOIDOSCOPY,BIOPSY  10/22/2013    Procedure: SIGMOIDOSCOPY, FLEXIBLE; WITH BIOPSY, SINGLE OR MULTIPLE;  Surgeon: Letta Pate, MD;  Location: GI PROC[facility]URES [facility];  Service: Gastroenterology    PR UPPER GI ENDOSCOPY,DIAGNOSIS Left 05/13/2020    Procedure: UGI ENDO, INCLUDE ESOPHAGUS, STOMACH, & DUODENUM &/OR JEJUNUM; DX W/WO COLLECTION SPECIMN, BY BRUSH OR WASH;  Surgeon: Modena Nunnery, MD;  Location: [facility];  Service: Gastroenterology    PR WEDGE BIOPSY OF LIVER N/A 11/13/2022    Procedure: BIOPSY OF LIVER, WEDGE; Surgeon: Marion Downer, MD;  Location: MAIN OR [place];  Service: Surgical Oncology    reatna      RETINAL DETACHMENT SURGERY      SINUS SURGERY         MEDICATIONS:      Current Outpatient Medications:     acetaminophen (TYLENOL) 500 MG tablet, Take 2 tablets (1,000 mg total) by mouth every eight (8) hours.,  Disp: 90 tablet, Rfl: 0    [START ON 02/28/2023] capecitabine (XELODA) 500 MG tablet, Take 3 tablets (1,500 mg total) by mouth every morning AND 4 tablets (2,000 mg total) every evening. Only take on days 1 through 14 of each 21-day cycle. Take with full glass of water after a meal, within 30 minutes. Swallow tablets whole; do not cut or crush., Disp: 98 tablet, Rfl: 7    enoxaparin (LOVENOX) 40 mg/0.4 mL Syrg, Inject 0.4 mL (40 mg total) under the skin daily., Disp: 11.2 mL, Rfl: 0    hydrOXYzine (ATARAX) 25 MG tablet, Take one or two tablets at bedtime as needed for anxiety or sleep, Disp: 60 tablet, Rfl: 11    insulin glargine (BASAGLAR, LANTUS) 100 unit/mL (3 mL) injection pen, Inject 0.15 mL (15 Units total) under the skin nightly., Disp: 15 mL, Rfl: 0    JARDIANCE 10 mg tablet, TAKE ONE TABLET BY MOUTH DAILY AT 9AM FOR KIDNEY PROTECTION, Disp: , Rfl:     lidocaine 4 % patch, Place 1 patch on the skin daily as needed (pain). Apply for 12 hours then remove for 12 hours., Disp: 15 patch, Rfl: 0    MELATONIN, BULK, MISC, by Miscellaneous route., Disp: , Rfl:     metFORMIN (GLUCOPHAGE) 500 MG tablet, Take 1 tablet (500 mg total) by mouth in the morning and 1 tablet (500 mg total) in the evening. Take with meals., Disp: 60 tablet, Rfl: 0    midodrine (PROAMATINE) 5 MG tablet, Take 1 tablet (5 mg total) by mouth three (3) times a day., Disp: 90 tablet, Rfl: 0    omeprazole (PRILOSEC) 40 MG capsule, , Disp: , Rfl:     pen needle, diabetic 32 gauge x 5/32 (4 mm) Ndle, Use with insulin up to 4 times/day as needed., Disp: 100 each, Rfl: 0    QUEtiapine (SEROQUEL) 100 MG tablet, Take 1 tablet (100 mg total) by mouth nightly as needed (sleep and anxiety)., Disp: 90 tablet, Rfl: 3    venlafaxine (EFFEXOR-XR) 150 MG 24 hr capsule, Take 1 capsule (150 mg total) by mouth daily., Disp: 90 capsule, Rfl: 3    amoxicillin-clavulanate (AUGMENTIN) 875-125 mg per tablet, Take 1 tablet by mouth two (2) times a day., Disp: 60 tablet, Rfl: 0    diphenoxylate-atropine (LOMOTIL) 2.5-0.025 mg per tablet, Take 2 tablets by mouth four (4) times a day., Disp: 720 tablet, Rfl: 0      ALLERGIES:    Sulfa (sulfonamide antibiotics)    SOCIAL HISTORY:    Social History     Socioeconomic History    Marital status: Married   Tobacco Use    Smoking status: Never    Smokeless tobacco: Never   Vaping Use    Vaping status: Never Used   Substance and Sexual Activity    Alcohol use: No     Alcohol/week: 0.0 standard drinks of alcohol    Drug use: No   Social History Narrative    The patient is a Clinical research associate.      He has lived in [place], [place], [place] and [place]     Social Determinants of Health     Financial Resource Strain: Low Risk  (07/29/2020)    Overall Financial Resource Strain (CARDIA)     Difficulty of Paying Living Expenses: Not hard at all   Food Insecurity: No Food Insecurity (07/27/2022)    Received from [facility]    Hunger Vital Sign  Worried About Programme researcher, broadcasting/film/video in the Last Year: Never true     Ran Out of Food in the Last Year: Never true   Transportation Needs: No Transportation Needs (07/27/2022)    Received from [facility] - Transportation     Lack of Transportation (Medical): No     Lack of Transportation (Non-Medical): No   Physical Activity: Sufficiently Active (06/16/2021)    Exercise Vital Sign     Days of Exercise per Week: 4 days     Minutes of Exercise per Session: 50 min   Stress: No Stress Concern Present (06/16/2021)    [employer] of Occupational Health - Occupational Stress Questionnaire     Feeling of Stress : Only a little   Social Connections: Moderately Integrated (06/16/2021)    Social Connection and Isolation Panel [NHANES]     Frequency of Communication with Friends and Family: More than three times a week     Frequency of Social Gatherings with Friends and Family: Never     Attends Religious Services: More than 4 times per year     Active Member of [employer] or Organizations: No     Attends Engineer, structural: Never     Marital Status: Married       FAMILY HISTORY:    family history includes Arthritis in his father, maternal grandfather, and mother; Cancer in his maternal grandfather and paternal grandfather; Depression in his brother, father, and sister; Glaucoma in his father; Inflammatory bowel disease in his brother, father, and sister; Liver disease in his sister; Vision loss in his father.      VITAL SIGNS:    BP 116/72 (BP Site: L Arm, BP Position: Sitting, BP Cuff Size: Medium)  - Pulse 87  - Temp 35.8 ??C (96.4 ??F) (Temporal)  - Ht 178 cm (5' 10.08)  - Wt 75.3 kg (166 lb)  - BMI 23.76 kg/m??     BP Readings from Last 3 Encounters:   02/14/23 116/72   02/08/23 77/61   01/31/23 121/66      Wt Readings from Last 10 Encounters:   02/14/23 75.3 kg (166 lb)   02/08/23 75.4 kg (166 lb 3.6 oz)   01/31/23 75.6 kg (166 lb 9.6 oz)   01/11/23 73.2 kg (161 lb 6.4 oz)   01/10/23 72.3 kg (159 lb 6.4 oz)   12/27/22 76.2 kg (168 lb)   12/24/22 76.5 kg (168 lb 9.6 oz)   12/21/22 77.9 kg (171 lb 11.2 oz)   12/07/22 77.2 kg (170 lb 4.8 oz)   11/30/22 77.8 kg (171 lb 9.6 oz)      BMI: Estimated body mass index is 23.76 kg/m?? as calculated from the following:    Height as of this encounter: 178 cm (5' 10.08).    Weight as of this encounter: 75.3 kg (166 lb).    BSA: Estimated body surface area is 1.93 meters squared as calculated from the following:    Height as of this encounter: 178 cm (5' 10.08).    Weight as of this encounter: 75.3 kg (166 lb).  PHYSICAL EXAM:    Normal comprehensive exam:     Constitutional:   Alert, oriented x 3, no acute distress, well nourished, and well hydrated.   Mental Status:   Thought organized, appropriate affect, pleasantly interactive, not anxious appearing.   HEENT:   PERRL, conjunctiva clear, anicteric, oropharynx clear, neck supple, no LAD.   Respiratory: Clear to auscultation, unlabored breathing.  Cardiac: Euvolemic, regular rate and rhythm, normal S1 and S2, no murmur.     Abdomen: Soft, normal bowel sounds, non-distended, non-tender, no organomegaly or masses.     Perianal/Rectal Exam Not performed.     Extremities:   No edema, well perfused.   Musculoskeletal: No joint swelling or tenderness noted, no deformities.     Skin: No rashes, jaundice or skin lesions noted. A skin lesion noted in the face (beneath the lower lib) related to his recent skin infection      Neuro: No focal deficits.          ASSESSMENT:        1. UC initially diagnosed 36. S/p colectomy and IPAA in 2 stages for evidence of high-grade dysplasia on surveillance colonoscopy 1996.   2. History of pouchitis previously managed with Canasa suppositories and hydrocortisone suppositories   3. History of Anxiety/depression and PTSD     Course of Disease:    Patient was diagnosed with UC in 1988, initially managed with sulfasalazine and recurrent courses of prednisone.  In 1996, colonoscopy revealed dysplasia and he subsequently underwent total abdominal colectomy with IPAA in 1996/1997.    Pouchoscopy 07/2014 revealed mild inflammation of the pouch and granularity involving the neo-terminal ileum.  Start ciprofloxacin/flagyl for 2 weeks, then ciprofloxacin for another 2 weeks.    01/2016 increase of symptoms once off antibiotics, re start of Cipro.   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 Plan now for Augmentin. Additionally Lomotil 2 tablet 4 times daily.     We will also check calprotectin now.     History of high-grade dysplasia before colectomy: Need for pouchoscopy later on this year ( last pouchoscopy normal in 04/2020) once his clinical course is stabilized.     Kidney function: Currently CKD 3A, which could be due to dehydration or combination of his uncontrolled diabetes over multiple years and dehydration.    Iron deficiency: Latent iron deficiency.  I think he would benefit from an iron infusion and discuss with patient.     Diabetes: Hemoglobin A1c 8.6.  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  GLP-1 agonist would be beneficial in controlling his well his bowel frequency due to negative impact on gastroduodenal and small bowel motility.  However I am not sure if the blood sugar of the patient can be controlled without the use of insulin.    Bone health: Start of 2000 IU Vit D daily  Lab Results   Component Value Date    VITDTOTAL 19.9 (L) 01/10/2023         PLAN:           Patient Instructions   Take lomotil 2 tablets 3-4 times daily (every 6 hours)    Start Augmentin 1 tablet twice daily for 2 weeks and let me know how you are doing    Take Vitamin D 2000 IU over  counter once daily        -------------------------------------------------------------------------------------------------------------    DIAGNOSTIC STUDIES:  I have reviewed all pertinent diagnostic studies, including:      GI Procedures:    04/2020  Normal pouch , prepouch and cuff    Last pouchoscopy- 08/04/2014:  Mild non-specific changes were noted in the neo-terminal  ileum (granularity and decreased vascular pattern) and the pouch (scattered erythema and scarred mucosa). biopsies were obtained. Short cuff was seen with some irregularity along the   columnosquamous junction. biopsies were obtained      Pathology :Prepouch, biopsy   - Moderate chronic active enteritis, negative for dysplasia  - No viral cytopathic effect or granulomas identified    B: Pouch, biopsy   - Moderate chronic active enteritis (pouchitis), negative for dysplasia  - No viral cytopathic effect or granulomas identified    C: Cuff, biopsy   - Moderate chronic active colitis (cuffitis), negative for dysplasia  - No viral cytopathic effect or granulomas identified    Laboratory results:    Office Visit on 02/14/2023   Component Date Value Ref Range Status    WBC 02/14/2023 6.3  3.6 - 11.2 10*9/L Final    RBC 02/14/2023 4.77  4.26 - 5.60 10*12/L Final    HGB 02/14/2023 13.5  12.9 - 16.5 g/dL Final    HCT 16/06/9603 39.9  39.0 - 48.0 % Final    MCV 02/14/2023 83.5  77.6 - 95.7 fL Final    MCH 02/14/2023 28.3  25.9 - 32.4 pg Final    MCHC 02/14/2023 33.9  32.0 - 36.0 g/dL Final    RDW 54/05/8118 14.6  12.2 - 15.2 % Final    MPV 02/14/2023 7.1  6.8 - 10.7 fL Final    Platelet 02/14/2023 180  150 - 450 10*9/L Final    Sodium 02/14/2023 139  135 - 145 mmol/L Final    Potassium 02/14/2023 4.2  3.4 - 4.8 mmol/L Final    Chloride 02/14/2023 107  98 - 107 mmol/L Final    CO2 02/14/2023 23.0  20.0 - 31.0 mmol/L Final    Anion Gap 02/14/2023 9  5 - 14 mmol/L Final    BUN 02/14/2023 29 (H)  9 - 23 mg/dL Final    Creatinine 14/78/2956 1.66 (H)  0.73 - 1.18 mg/dL Final    BUN/Creatinine Ratio 02/14/2023 17   Final    eGFR CKD-EPI (2021) Male 02/14/2023 45 (L)  >=60 mL/min/1.61m2 Final    Glucose 02/14/2023 179  70 - 179 mg/dL Final    Calcium 21/30/8657 10.0  8.7 - 10.4 mg/dL Final    Albumin 84/69/6295 4.2  3.4 - 5.0 g/dL Final    Total Protein 02/14/2023 7.4  5.7 - 8.2 g/dL Final    Total Bilirubin 02/14/2023 0.8  0.3 - 1.2 mg/dL Final    AST 28/41/3244 36 (H)  <=34 U/L Final    ALT 02/14/2023 40  10 - 49 U/L Final    Alkaline Phosphatase 02/14/2023 347 (H)  46 - 116 U/L Final    CRP 02/14/2023 7.0  <=10.0 mg/L Final    Iron 02/14/2023 61 (L)  65 - 175 ug/dL Final    TIBC 09/26/7251 404  250 - 425 ug/dL Final    Iron Saturation (%) 02/14/2023 15 (L)  20 - 55 % Final    Ferritin 02/14/2023 57.8  10.5 - 307.3 ng/mL Final   Lab on 02/08/2023   Component Date Value Ref Range Status    DNA  Test Name 02/08/2023 SEE COMMENT   Final    Miscellaneous DNA Result 02/08/2023 SEE COMMENT   Final    WBC 02/08/2023 5.4  3.6 - 11.2 10*9/L Final    RBC 02/08/2023 4.29  4.26 - 5.60 10*12/L Final    HGB 02/08/2023 12.4 (L)  12.9 - 16.5 g/dL Final    HCT 16/06/9603 35.0 (L)  39.0 - 48.0 % Final    MCV 02/08/2023 81.5  77.6 - 95.7 fL Final    MCH 02/08/2023 29.0  25.9 - 32.4 pg Final    MCHC 02/08/2023 35.6  32.0 - 36.0 g/dL Final    RDW 54/05/8118 14.8  12.2 - 15.2 % Final    MPV 02/08/2023 7.4  6.8 - 10.7 fL Final    Platelet 02/08/2023 153  150 - 450 10*9/L Final    Neutrophils % 02/08/2023 63.8  % Final    Lymphocytes % 02/08/2023 19.9  % Final    Monocytes % 02/08/2023 7.9  % Final    Eosinophils % 02/08/2023 6.9  % Final    Basophils % 02/08/2023 1.5  % Final    Absolute Neutrophils 02/08/2023 3.4  1.8 - 7.8 10*9/L Final    Absolute Lymphocytes 02/08/2023 1.1  1.1 - 3.6 10*9/L Final    Absolute Monocytes 02/08/2023 0.4  0.3 - 0.8 10*9/L Final    Absolute Eosinophils 02/08/2023 0.4  0.0 - 0.5 10*9/L Final    Absolute Basophils 02/08/2023 0.1  0.0 - 0.1 10*9/L Final

## 2023-02-13 NOTE — Unmapped (Signed)
.  Gaylyn Cheers w/ [employer]  contacted the Communication Center requesting to speak with the care team of Keith Ballard to discuss:    Ladene Artist is stating that the patient came there stating that they are suppose to have a medication there for him called in  for a blood pressure medication but the pharmacist says there is not one there so they cannot do anything.     Please contact Derrick back  at [phone] asap.        Thank you,   Noland Fordyce  [facility] Communication [facility]   [phone]

## 2023-02-13 NOTE — Unmapped (Signed)
Hi,     Keith Ballard has contacted the Communication Center in regards to the following symptom:     Fainting, falling (low blood pressure)    Please contact [name] at [phone]      Thank you,  Kelli Hope   [facility] Communication [facility]   [phone]

## 2023-02-13 NOTE — Unmapped (Signed)
.  Hi,     Keith Ballard contacted the [employer] requesting to speak with the care team of Keith Ballard to discuss:    Keith Ballard is stating that he has had two episodes where he passed out and the medication that he was prescribed he states the pharmacy states they don't have it so he is needing his blood pressure medication asap.     Please contact Keith Ballard back  at [phone] asap.      Thank you,   Keith Ballard  [facility] Communication Center   [phone]

## 2023-02-14 ENCOUNTER — Ambulatory Visit: Admit: 2023-02-14 | Discharge: 2023-02-15 | Payer: MEDICARE | Attending: Gastroenterology | Primary: Gastroenterology

## 2023-02-14 DIAGNOSIS — K91850 Pouchitis: Principal | ICD-10-CM

## 2023-02-14 DIAGNOSIS — E611 Iron deficiency: Principal | ICD-10-CM

## 2023-02-14 LAB — CBC
HEMATOCRIT: 39.9 % (ref 39.0–48.0)
HEMOGLOBIN: 13.5 g/dL (ref 12.9–16.5)
MEAN CORPUSCULAR HEMOGLOBIN CONC: 33.9 g/dL (ref 32.0–36.0)
MEAN CORPUSCULAR HEMOGLOBIN: 28.3 pg (ref 25.9–32.4)
MEAN CORPUSCULAR VOLUME: 83.5 fL (ref 77.6–95.7)
MEAN PLATELET VOLUME: 7.1 fL (ref 6.8–10.7)
PLATELET COUNT: 180 10*9/L (ref 150–450)
RED BLOOD CELL COUNT: 4.77 10*12/L (ref 4.26–5.60)
RED CELL DISTRIBUTION WIDTH: 14.6 % (ref 12.2–15.2)
WBC ADJUSTED: 6.3 10*9/L (ref 3.6–11.2)

## 2023-02-14 LAB — COMPREHENSIVE METABOLIC PANEL
ALBUMIN: 4.2 g/dL (ref 3.4–5.0)
ALKALINE PHOSPHATASE: 347 U/L — ABNORMAL HIGH (ref 46–116)
ALT (SGPT): 40 U/L (ref 10–49)
ANION GAP: 9 mmol/L (ref 5–14)
AST (SGOT): 36 U/L — ABNORMAL HIGH (ref <=34)
BILIRUBIN TOTAL: 0.8 mg/dL (ref 0.3–1.2)
BLOOD UREA NITROGEN: 29 mg/dL — ABNORMAL HIGH (ref 9–23)
BUN / CREAT RATIO: 17
CALCIUM: 10 mg/dL (ref 8.7–10.4)
CHLORIDE: 107 mmol/L (ref 98–107)
CO2: 23 mmol/L (ref 20.0–31.0)
CREATININE: 1.66 mg/dL — ABNORMAL HIGH
EGFR CKD-EPI (2021) MALE: 45 mL/min/{1.73_m2} — ABNORMAL LOW (ref >=60)
GLUCOSE RANDOM: 179 mg/dL (ref 70–179)
POTASSIUM: 4.2 mmol/L (ref 3.4–4.8)
PROTEIN TOTAL: 7.4 g/dL (ref 5.7–8.2)
SODIUM: 139 mmol/L (ref 135–145)

## 2023-02-14 LAB — FERRITIN: FERRITIN: 57.8 ng/mL

## 2023-02-14 LAB — IRON PANEL
IRON SATURATION: 15 % — ABNORMAL LOW (ref 20–55)
IRON: 61 ug/dL — ABNORMAL LOW
TOTAL IRON BINDING CAPACITY: 404 ug/dL (ref 250–425)

## 2023-02-14 LAB — C-REACTIVE PROTEIN: C-REACTIVE PROTEIN: 7 mg/L (ref <=10.0)

## 2023-02-14 MED ORDER — DIPHENOXYLATE-ATROPINE 2.5 MG-0.025 MG TABLET
ORAL_TABLET | Freq: Four times a day (QID) | ORAL | 0 refills | 90 days | Status: CP
Start: 2023-02-14 — End: 2023-05-15

## 2023-02-14 MED ORDER — AMOXICILLIN 875 MG-POTASSIUM CLAVULANATE 125 MG TABLET
ORAL_TABLET | Freq: Two times a day (BID) | ORAL | 0 refills | 30 days | Status: CP
Start: 2023-02-14 — End: 2023-03-16

## 2023-02-14 MED ORDER — MIDODRINE 5 MG TABLET
ORAL_TABLET | Freq: Three times a day (TID) | ORAL | 0 refills | 30 days | Status: CP
Start: 2023-02-14 — End: 2023-03-16

## 2023-02-14 NOTE — Unmapped (Signed)
[facility] Triage Note     Patient: Keith Ballard     Reason for call:  return call    Time call returned: 1709    Goal for this communication: pt is looking for prescription for midrodrine     Phone Assessment: Pt states that he has had a couple of episodes where he has passed out and many more where he feels he is close to passing out.  He says Dr Elmon Kirschner was going to call something into the pharmacy for him but the pharmacy says they do not have a prescription.  RN reviewed Dr Kirt Boys last note and it does suggest midodrine or something similar but says she called his PCP, Dr Neita Goodnight and they will manage his blood pressures.    Pt is now saying the Dr Neita Goodnight is only at that clinic on Wednesday mornings and is asking if Dr Elmon Kirschner can send a one week supply until he can get up with Dr Neita Goodnight.  RN will consult with team, but it is after 1700 so it will be tomorrow before RN can reach the team.  Until then, RN recommended going to the ED if he continues to feel weak or feels he may pass out again.  Pt states understanding.                 Approx time spent reviewing chart, calling pt and documentation of call: 40 minutes

## 2023-02-14 NOTE — Unmapped (Signed)
Take lomotil 2 tablets 3-4 times daily (every 6 hours)    Start Augmentin 1 tablet twice daily for 2 weeks and let me know how you are doing    Take Vitamin D 2000 IU over counter once daily

## 2023-02-14 NOTE — Unmapped (Signed)
Returned call to patient. Dr. Elmon Kirschner will send a short term prescription of midodrine (5 mg oral three times a day) to his local pharmacy and he should follow up with his PCP for long term management.

## 2023-02-19 NOTE — Unmapped (Signed)
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  I am speaking to the patient.    Was a Nurse, learning disability used for this call? No    Verified patient's date of birth / HIPAA.    Specialty medication(s) to be sent: Hematology/Oncology: Capecitabine 500 mg, directions: Take 3 tablets (1,500 mg total) by mouth every morning AND 4 tablets (2,000 mg total) every evening. Only take on days 1 through 14 of each 21-day cycle. Take with full glass of water after a meal, within 30 minutes. Swallow tablets whole; do not cut or crush.    Non-specialty medications/supplies to be sent: none    Medications not needed at this time: none     Xeloda (capecitabine)    Medication & Administration     Dosage: Take 3 tablets (1,500 mg total) by mouth every morning AND 4 tablets (2,000 mg total) every evening. Only take on days 1 through 14 of each 21-day cycle. Take with full glass of water after a meal, within 30 minutes. Swallow tablets whole; do not cut or crush.    Administration: I reviewed the importance of taking with a full glass of water within 30 minutes of a meal (at least 1 cup of food). Discussed that tablet should be swallowed whole and cannot be crushed or chewed.    Adherence/Missed dose instruction: If a dose is missed, do not take an extra dose or two doses at one time. Simply take your next dose at the regularly scheduled time and record any missed doses so our team is aware.    Goals of Therapy     Prevent disease progression    Side Effects & Monitoring Parameters     Nausea/vomiting  Diarrhea/constipation  Infection precautions  Fatigue  Mouth sores or irritation  Hand/foot syndrome (tingling, numbness, pain, redness, peeling or blistering) Use Urea 20% cream, Aquaphor, Eucerin, Cetaphil, or CeraVe twice daily on hands and feet as preventative  Sun precautions  Decrease appetite/ taste changes  Bleeding precautions (bruising easily, nose bleeds, gums bleed)  Headache  Dry skin  Hair loss    The following side effects should be reported to the provider:  Diarrhea not controlled by anti-diarrheals  Swelling, warmth, numbness, change of color or pain in a leg or arm  Signs of infection (fever >100.4, chills, sore throat, sputum production)  Signs of liver problems (dark urine, abdominal pain, light-colored stools, vomiting, yellow skin or eyes)  Signs of bleeding (vomiting or coughing up blood, blood that looks like coffee grounds, blood in the urine or black, red tarry stools, bruising that gets bigger without reason, any persistent or severe bleeding)  Skin rash or signs of skin infection (cracking, peeling, blistering, bleeding skin, red or irritated eyes)  Signs of fluid and electrolyte problems (mood changes, confusion, abnormal/fast  heartbeat, severe dizziness, passing out, increased thirst, seizures, loss of strength and energy, lack of appetite, unable to pass urine or change in amount of urine passed, dry mouth, dry eyes, or nausea or vomiting.  Signs of hand foot syndrome (redness or irritation on the palms of the hands or soles of the feet)  Signs of mucositis (red or swollen mouth/gums, sores in the mouth, gums or tongue, soreness or pain in the mouth or throat, difficulty swallowing or talking, dryness, mild burning, or pain when eating food white patches or pus in the  mouth or on the tongue, increased mucus or thicker saliva)  Signs of anaphylaxis (wheezing, chest tightness, swelling of face, lips, tongue or throat)    Monitoring parameters: Renal function should be estimated at baseline to determine initial dose. During therapy, CBC with differential, hepatic function, and renal function should be monitored. Monitor INR closely if receiving concomitant warfarin. Pregnancy test prior to treatment initiation (in females of reproductive potential). Monitor for diarrhea, dehydration, hand-foot syndrome, Stevens-Johnson syndrome, toxic epidermal necrolysis, stomatitis, and cardiotoxicity. Monitor adherence.    Contraindications, Warnings & Precautions     [place] Boxed Warning: Capecitabine may increase the anticoagulant effects of warfarin; bleeding events, including death, have occurred with concomitant use. Clinically significant increases in prothrombin time (PT) and INR have occurred within several days to months after capecitabine initiation (in patients previously stabilized on anticoagulants), and may continue up to 1 month after capecitabine discontinuation. May occur in patients with or without liver metastases. Monitor PT and INR frequently and adjust anticoagulation dosing accordingly. An increased risk of coagulopathy is correlated with a cancer diagnosis and age >60 years.    Contraindications:  Known hypersensitivity to capecitabine, fluorouracil, or any component of the formulation;   Severe renal impairment (CrCl <30 mL/minute)    Warnings and Precautions:  Bone marrow suppression  Cardiotoxicity: Myocardial infarction, ischemia, angina, dysrhythmias, cardiac arrest, cardiac failure, sudden death, ECG changes, and cardiomyopathy  Mouth sores-discussed use of baking soda/salt water rinses  Dermatologic toxicity: Stevens-Johnson syndrome and toxic epidermal necrolysis   Hand/foot prevention (moisturizers, luke warm hand washes, patting hands/feet dry, decrease rubbing on hands/feet)  GI toxicity: May cause diarrhea (may be severe); Importance of good nutrition to help minimize diarrhea (high protein, BRAT, yogurt, avoid greasy and spicy foods).  Importance of hydration if having frequent diarrhea  Reproductive concerns: Evaluate pregnancy status prior to therapy in females of reproductive potential. Females of reproductive potential should use effective contraception during treatment and for 6 months after the last dose. Males with male partners of reproductive potential should use effective contraception during treatment and for 3 months after the last dose. Based on the mechanism of action and data from animal reproduction studies, in utero exposure to capecitabine may cause fetal harm.   Breast feeding considerations: It is not known if capecitabine is present in breast milk. Due to the potential for serious adverse reactions in the breastfed infant, breastfeeding is not recommended by the manufacturer during treatment and for 2 weeks after the last dose.    Drug/Food Interactions     Medication list reviewed in Epic. The patient was instructed to inform the care team before taking any new medications or supplements.  Coadministration of proton pump inhibitors (PPIs) may reduce the bioavailability of capecitabine, leading to decreased systemic levels and effectiveness. .   Avoid live vaccines.    Storage, Handling Precautions, & Disposal     This medication should be stored at room temperature and in a dry location. Keep out of reach of others including children and pets. Keep the medicine in the original container with a child-proof top (no pillboxes). Do not throw away or flush unused medication down the toilet or sink. This drug is considered hazardous and should be handled as little as possible.  If someone else helps with medication administration, they should wear gloves.    Current Medications (including OTC/herbals), Comorbidities and Allergies     Current Outpatient Medications   Medication Sig Dispense Refill    acetaminophen (TYLENOL) 500 MG tablet Take 2  tablets (1,000 mg total) by mouth every eight (8) hours. 90 tablet 0    amoxicillin-clavulanate (AUGMENTIN) 875-125 mg per tablet Take 1 tablet by mouth two (2) times a day. 60 tablet 0    [START ON 02/28/2023] capecitabine (XELODA) 500 MG tablet Take 3 tablets (1,500 mg total) by mouth every morning AND 4 tablets (2,000 mg total) every evening. Only take on days 1 through 14 of each 21-day cycle. Take with full glass of water after a meal, within 30 minutes. Swallow tablets whole; do not cut or crush. 98 tablet 7    diphenoxylate-atropine (LOMOTIL) 2.5-0.025 mg per tablet Take 2 tablets by mouth four (4) times a day. 720 tablet 0    enoxaparin (LOVENOX) 40 mg/0.4 mL Syrg Inject 0.4 mL (40 mg total) under the skin daily. 11.2 mL 0    hydrOXYzine (ATARAX) 25 MG tablet Take one or two tablets at bedtime as needed for anxiety or sleep 60 tablet 11    insulin glargine (BASAGLAR, LANTUS) 100 unit/mL (3 mL) injection pen Inject 0.15 mL (15 Units total) under the skin nightly. 15 mL 0    JARDIANCE 10 mg tablet TAKE ONE TABLET BY MOUTH DAILY AT 9AM FOR KIDNEY PROTECTION      lidocaine 4 % patch Place 1 patch on the skin daily as needed (pain). Apply for 12 hours then remove for 12 hours. 15 patch 0    MELATONIN, BULK, MISC by Miscellaneous route.      metFORMIN (GLUCOPHAGE) 500 MG tablet Take 1 tablet (500 mg total) by mouth in the morning and 1 tablet (500 mg total) in the evening. Take with meals. 60 tablet 0    midodrine (PROAMATINE) 5 MG tablet Take 1 tablet (5 mg total) by mouth three (3) times a day. 90 tablet 0    omeprazole (PRILOSEC) 40 MG capsule       pen needle, diabetic 32 gauge x 5/32 (4 mm) Ndle Use with insulin up to 4 times/day as needed. 100 each 0    QUEtiapine (SEROQUEL) 100 MG tablet Take 1 tablet (100 mg total) by mouth nightly as needed (sleep and anxiety). 90 tablet 3    venlafaxine (EFFEXOR-XR) 150 MG 24 hr capsule Take 1 capsule (150 mg total) by mouth daily. 90 capsule 3     No current facility-administered medications for this visit.       Allergies   Allergen Reactions    Sulfa (Sulfonamide Antibiotics) Hives and Rash       Patient Active Problem List   Diagnosis    Obstructive sleep apnea    Posterior subcapsular polar senile cataract    Intestinal bypass or anastomosis status    Senile nuclear sclerosis    UC (ulcerative colitis) (CMS-HCC)    Vocal process granuloma    Vocal fold scar    Periodic limb movement disorder (PLMD)    Pouchitis (CMS-HCC)    Panic attack    Onychomycosis    Type 2 diabetes mellitus, with long-term current use of insulin (CMS-HCC)    Vasculogenic erectile dysfunction    TBI (traumatic brain injury) (CMS-HCC)    Right knee pain    PTSD (post-traumatic stress disorder)    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    Appropriateness of Therapy     Acute infections noted within Epic:  No active infections  Patient reported infection: None    Is medication and dose appropriate based on diagnosis and infection status? Yes    Prescription has been clinically reviewed: Yes    Baseline Quality of Life Assessment      How many days over the past month did your gallbladder cancer  keep you from your normal activities? For example, brushing your teeth or getting up in the morning. 0    Financial Information     Medication Assistance provided: None Required    Anticipated copay of $25 / 21 days reviewed with patient. Verified delivery address.    Delivery Information     Scheduled delivery date: 02/26/23    Expected start date: 02/28/23    Medication will be delivered via Next Day Courier to the prescription address in [facility].  This shipment will not require a signature.      Explained the services we provide at [facility] and that each month we would call to set up refills.  Stressed importance of returning phone calls so that we could ensure they receive their medications in time each month.  Informed patient that we should be setting up refills 7-10 days prior to when they will run out of medication.  A pharmacist will reach out to perform a clinical assessment periodically.  Informed patient that a welcome packet, containing information about our pharmacy and other support services, a Notice of Privacy Practices, and a drug information handout will be sent.      The patient or caregiver noted above participated in the development of this care plan and knows that they can request review of or adjustments to the care plan at any time.      Patient or caregiver verbalized understanding of the above information as well as how to contact the pharmacy at [phone] option 4 with any questions/concerns.  The pharmacy is open Monday through Friday 8:30am-4:30pm.  A pharmacist is available 24/7 via pager to answer any clinical questions they may have.    Patient Specific Needs     Does the patient have any physical, cognitive, or cultural barriers? No    Does the patient have adequate living arrangements? (i.e. the ability to store and take their medication appropriately) Yes    Did you identify any home environmental safety or security hazards? No    Patient prefers to have medications discussed with  Patient     Is the patient or caregiver able to read and understand education materials at a high school level or above? Yes    Patient's primary language is  English     Is the patient high risk? Yes, patient is taking oral chemotherapy. Appropriateness of therapy as been assessed    SOCIAL DETERMINANTS OF HEALTH     At the [facility], we have learned that life circumstances - like trouble affording food, housing, utilities, or transportation can affect the health of many of our patients.   That is why we wanted to ask: are you currently experiencing any life circumstances that are negatively impacting your health and/or quality of life? No    Social Determinants of Health     Financial Resource Strain: Low Risk  (  07/29/2020)    Overall Financial Resource Strain (CARDIA)     Difficulty of Paying Living Expenses: Not hard at all   Internet Connectivity: Not on file   Food Insecurity: No Food Insecurity (07/27/2022)    Received from [facility]    Hunger Vital Sign     Worried About Running Out of Food in the Last Year: Never true     Ran Out of Food in the Last Year: Never true   Tobacco Use: Low Risk  (01/31/2023)    Patient History     Smoking Tobacco Use: Never     Smokeless Tobacco Use: Never     Passive Exposure: Not on file   Housing/Utilities: Low Risk  (07/29/2020)    Housing/Utilities     Within the past 12 months, have you ever stayed: outside, in a car, in a tent, in an overnight shelter, or temporarily in someone else's home (i.e. couch-surfing)?: No     Are you worried about losing your housing?: No     Within the past 12 months, have you been unable to get utilities (heat, electricity) when it was really needed?: No   Alcohol Use: Not At Risk (06/16/2021)    Alcohol Use     How often do you have a drink containing alcohol?: Never     How many drinks containing alcohol do you have on a typical day when you are drinking?: 1 - 2     How often do you have 5 or more drinks on one occasion?: Never   Transportation Needs: No Transportation Needs (07/27/2022)    Received from [facility]     Lack of Transportation (Medical): No     Lack of Transportation (Non-Medical): No   Substance Use: Low Risk  (06/16/2021)    Substance Use     Taken prescription drugs for non-medical reasons: Never     Taken illegal drugs: Never     Patient indicated they have taken drugs in the past year for non-medical reasons: Yes, [positive answer(s)]: Not on file   Health Literacy: Medium Risk (06/16/2021)    Health Literacy     : Rarely   Physical Activity: Sufficiently Active (06/16/2021)    Exercise Vital Sign     Days of Exercise per Week: 4 days     Minutes of Exercise per Session: 50 min   Interpersonal Safety: Not on file   Stress: No Stress Concern Present (06/16/2021)    [employer] of Occupational Health - Occupational Stress Questionnaire     Feeling of Stress : Only a little   Intimate Partner Violence: Not At Risk (07/27/2022)    Received from [facility]    Humiliation, Afraid, Rape, and Kick questionnaire     Fear of Current or Ex-Partner: No     Emotionally Abused: No     Physically Abused: No     Sexually Abused: No   Depression: Not at risk (10/26/2022)    PHQ-2     PHQ-2 Score: 0   Social Connections: Moderately Integrated (06/16/2021)    Social Connection and Isolation Panel [NHANES]     Frequency of Communication with Friends and Family: More than three times a week     Frequency of Social Gatherings with Friends and Family: Never     Attends Religious Services: More than 4 times per year     Active Member of Clubs or Organizations: No     Attends [employer]  or Organization Meetings: Never     Marital Status: Married     Would you be willing to receive help with any of the needs that you have identified today? Not applicable       Kermit Balo, [facility]  [facility] Specialty Pharmacist

## 2023-02-20 NOTE — Unmapped (Signed)
Placed call to patient to follow up on reports of dizziness and if starting midodrine improved his symptoms or not. Also wanted to be sure he has a follow up appointment with his PCP. Asked for a return call.

## 2023-02-20 NOTE — Unmapped (Signed)
Keith Ballard contacted the [employer] requesting to speak with the care team of Keith Ballard to discuss:    -updating the nurse that he is going to [facility] clinic and it will be after 5p when he gets his medication and return home. Patient noted that he can be called tomorrow.    Please contact Keith Ballard  at [phone] .    [x]  Preferred Name   [x]  DOB and/or MR#  [x]  Preferred Contact Method  [x]  Phone Number(s)   [x]  MyChart     Thank you,   Durward Fortes  [facility]   [phone]

## 2023-02-21 NOTE — Unmapped (Signed)
Returned call to Mr. Rapson. He started the midodrine this past Tuesday and has not noticed a significant improvement in his symptoms just yet. He had an appointment with  Hospital yesterday (5/29) for further management of dizziness. He plans to start Xeloda next week with Dr. Timoteo Gaul. Reviewed appointments for 6/6.

## 2023-02-25 MED FILL — CAPECITABINE 500 MG TABLET: ORAL | 21 days supply | Qty: 98 | Fill #0

## 2023-02-26 DIAGNOSIS — C23 Malignant neoplasm of gallbladder: Principal | ICD-10-CM

## 2023-02-28 ENCOUNTER — Other Ambulatory Visit: Admit: 2023-02-28 | Discharge: 2023-03-01 | Payer: MEDICARE

## 2023-02-28 ENCOUNTER — Ambulatory Visit: Admit: 2023-02-28 | Discharge: 2023-03-01 | Payer: MEDICARE

## 2023-02-28 DIAGNOSIS — R197 Diarrhea, unspecified: Principal | ICD-10-CM

## 2023-02-28 DIAGNOSIS — C23 Malignant neoplasm of gallbladder: Principal | ICD-10-CM

## 2023-02-28 DIAGNOSIS — Z79899 Other long term (current) drug therapy: Principal | ICD-10-CM

## 2023-02-28 LAB — CBC W/ AUTO DIFF
BASOPHILS ABSOLUTE COUNT: 0.1 10*9/L (ref 0.0–0.1)
BASOPHILS RELATIVE PERCENT: 1 %
EOSINOPHILS ABSOLUTE COUNT: 0.4 10*9/L (ref 0.0–0.5)
EOSINOPHILS RELATIVE PERCENT: 5.6 %
HEMATOCRIT: 36.1 % — ABNORMAL LOW (ref 39.0–48.0)
HEMOGLOBIN: 12.8 g/dL — ABNORMAL LOW (ref 12.9–16.5)
LYMPHOCYTES ABSOLUTE COUNT: 1.5 10*9/L (ref 1.1–3.6)
LYMPHOCYTES RELATIVE PERCENT: 18.7 %
MEAN CORPUSCULAR HEMOGLOBIN CONC: 35.5 g/dL (ref 32.0–36.0)
MEAN CORPUSCULAR HEMOGLOBIN: 28.7 pg (ref 25.9–32.4)
MEAN CORPUSCULAR VOLUME: 80.8 fL (ref 77.6–95.7)
MEAN PLATELET VOLUME: 7.5 fL (ref 6.8–10.7)
MONOCYTES ABSOLUTE COUNT: 0.5 10*9/L (ref 0.3–0.8)
MONOCYTES RELATIVE PERCENT: 6.7 %
NEUTROPHILS ABSOLUTE COUNT: 5.3 10*9/L (ref 1.8–7.8)
NEUTROPHILS RELATIVE PERCENT: 68 %
PLATELET COUNT: 176 10*9/L (ref 150–450)
RED BLOOD CELL COUNT: 4.47 10*12/L (ref 4.26–5.60)
RED CELL DISTRIBUTION WIDTH: 14 % (ref 12.2–15.2)
WBC ADJUSTED: 7.8 10*9/L (ref 3.6–11.2)

## 2023-02-28 LAB — COMPREHENSIVE METABOLIC PANEL
ALBUMIN: 3.6 g/dL (ref 3.4–5.0)
ALKALINE PHOSPHATASE: 314 U/L — ABNORMAL HIGH (ref 46–116)
ALT (SGPT): 29 U/L (ref 10–49)
ANION GAP: 7 mmol/L (ref 5–14)
AST (SGOT): 28 U/L (ref <=34)
BILIRUBIN TOTAL: 0.7 mg/dL (ref 0.3–1.2)
BLOOD UREA NITROGEN: 39 mg/dL — ABNORMAL HIGH (ref 9–23)
BUN / CREAT RATIO: 23
CALCIUM: 9.2 mg/dL (ref 8.7–10.4)
CHLORIDE: 101 mmol/L (ref 98–107)
CO2: 23 mmol/L (ref 20.0–31.0)
CREATININE: 1.67 mg/dL — ABNORMAL HIGH
EGFR CKD-EPI (2021) MALE: 44 mL/min/{1.73_m2} — ABNORMAL LOW (ref >=60)
GLUCOSE RANDOM: 143 mg/dL (ref 70–179)
POTASSIUM: 3.8 mmol/L (ref 3.5–5.1)
PROTEIN TOTAL: 6.8 g/dL (ref 5.7–8.2)
SODIUM: 131 mmol/L — ABNORMAL LOW (ref 135–145)

## 2023-02-28 LAB — CANCER ANTIGEN 19-9: CA 19-9: 15.75 U/mL (ref 0–35)

## 2023-02-28 MED ORDER — PROCHLORPERAZINE MALEATE 10 MG TABLET
ORAL_TABLET | Freq: Four times a day (QID) | ORAL | 3 refills | 15 days | Status: CP | PRN
Start: 2023-02-28 — End: ?

## 2023-02-28 MED ORDER — LOPERAMIDE 2 MG TABLET
ORAL_TABLET | Freq: Four times a day (QID) | ORAL | 3 refills | 8.00000 days | Status: CP | PRN
Start: 2023-02-28 — End: 2023-02-28
  Filled 2023-05-09: qty 30, 4d supply, fill #0

## 2023-02-28 MED ORDER — CAPECITABINE 500 MG TABLET
ORAL_TABLET | Freq: Two times a day (BID) | ORAL | 3 refills | 14.00000 days | Status: CP
Start: 2023-02-28 — End: 2023-03-14
  Filled 2023-03-18: qty 84, 21d supply, fill #0

## 2023-02-28 MED ORDER — LOPERAMIDE 2 MG CAPSULE
ORAL_CAPSULE | 11 refills | 0 days
Start: 2023-02-28 — End: ?

## 2023-02-28 MED ORDER — ONDANSETRON HCL 8 MG TABLET
ORAL_TABLET | Freq: Three times a day (TID) | ORAL | 3 refills | 20 days | Status: CP | PRN
Start: 2023-02-28 — End: ?

## 2023-02-28 MED ADMIN — heparin, porcine (PF) 100 unit/mL injection 500 Units: 500 [IU] | INTRAVENOUS | @ 14:00:00 | Stop: 2023-03-01

## 2023-02-28 MED ADMIN — iohexol (OMNIPAQUE) 350 mg iodine/mL solution 100 mL: 100 mL | INTRAVENOUS | @ 14:00:00 | Stop: 2023-02-28

## 2023-02-28 NOTE — Unmapped (Signed)
[facility] RETURN VISIT        PATIENT IDENTIFICATION   Keith Ballard is a 67 y.o. 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      CANCER STAGING     Cancer Staging   Gallbladder cancer (CMS-HCC)  Staging form: Gallbladder, AJCC 8th Edition  - Pathologic: Stage IIB (pT2b, pN0, cM0) - Signed by Jeannine Boga, MD on 01/31/2023      ONCOLOGIC HISTORY     Gallbladder adenocarcinoma, pT2bN0  07/26/22, OSH CT for lymphoma treatment response assessment incidentally revealed multiple areas of soft tissue nodularity along the gallbladder wall the largest measuring up to 1.2 cm near the fundus.  08/30/22, [place] RUQ gallbladder showed indeterminant polypoid lesion with internal vascularity arising from the wall of the gallbladder.   10/15/22, MRI abdomen enhancing intraluminal gallbladder lesions measuring 1.8 and 0.6 cm. Detailed anatomy is limited due to motion artifact. However grossly, there is no appreciable wall thickening or evidence of invasion. Gallbladder polyps/ primary gallbladder neoplasm is favored over lymphoma.  10/26/22, CA 19-9=15.3  11/13/22, cholecystectomy and liver biopsy; Pathology showed a 1.5 cm moderately to poorly differentiated adenocarcinoma with micropapillary features invading through the muscularis propria into the peribiliary connective tissue, cystic duct and liver bed margins are negative, +LVI, +PNI. Segment 5 biopsy (prior lymphoma site) negative for carcinoma. Segment 4 biopsy showed features of periductal concentric fibrosis and focal copper accumulation and minimal macrovascular steatosis, negative for carcinoma.  12/26/22, portal lymphadenectomy and segment 4B/5 partial hepatectomy; Final path negative for carcinoma.  01/31/23, initial consultation at [facility] Med Onc  02/08/23, DPYD phenotype normal.  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      MOLECULAR DIAGNOSTICS     N/A    ASSESSMENT AND PLAN      # Gallbladder adenocarcinoma, pT2bN0  Incidentally identified on cholecystectomy specimen now s/p portal lymphadenectomy and segment 4B/5 partial hepatectomy on 12/26/22.     Mr. Keith Ballard is here for follow up. He started taking capecitabine on Tuesday. Tolerating it well so far. Labs are stable but CrCl is 46 mL/min. Will dose reduce capecitabine based on renal function.     PLAN  - Capecitabine 1500 mg po twice daily on days 1-14 and off for 7 days  - RTC on 6/27 for labs and follow up  - Written instructions on how to manage chemotherapy related nausea/vomiting, diarrhea, dehydration and neutropenia precautions given to patient       Orders Placed This Encounter   Procedures    CBC w/ Differential    Comprehensive Metabolic Panel       The natural history of cancer was reviewed and discussed with the patient and family members. Treatment options as well as risks and benefits were discussed with the patient/family. Patient and family member's questions answered to their satisfaction    I spent a total of 40 minutes in both face-to-face and non-face-to-face activities for this visit on the date of this encounter.        INTERVAL HISTORY   Keith Ballard is here for follow up. He is doing ok. Clinically stable from his last visit. Still feeling groggy and unsteady. Denies falls this week. He started taking  capecitabine on Tuesday. Noted abdominal tightness and mild nausea but no vomiting. Otherwise tolerating capecitabine well.       he denies fevers, chills and sweats. he  has no chest pain or dyspnea. he  has no mouth sores. he  denies nausea and vomiting as well as constipation and diarrhea.  No LE edema, rash or neurological complains. he  has good appetite and energy level.    REVIEW OF SYSTEMS   The remainder of a comprehensive 10 systems review is negative.     Allergies and Medications reviewed as per chart    PHYSICAL EXAMS     Vitals:    02/28/23 1142   BP: 111/68   Pulse: 84   Resp: 16   Temp: 36.3 ??C (97.4 ??F)   SpO2: 99%             02/28/23 77 kg (169 lb 11.2 oz)   02/14/23 75.3 kg (166 lb)   02/08/23 75.4 kg (166 lb 3.6 oz)      ECOG: (1) Restricted in physically strenuous activity, ambulatory and able to do work of light nature    GENERAL: well developed, well nourished, in no distress  PSYCH: full and appropriate range of affect with good insight and judgement  HEENT: NCAT, pupils equal, sclerae anicteric   EXT: warm and well perfused, no edema  SKIN: No rashes       OBJECTIVE DATA   LABS:   Results for orders placed or performed in visit on 02/28/23   Cancer Antigen 19-9   Result Value Ref Range    CA 19-9 15.75 0 - 35 U/mL   Comprehensive Metabolic Panel   Result Value Ref Range    Sodium 131 (L) 135 - 145 mmol/L    Potassium 3.8 3.5 - 5.1 mmol/L    Chloride 101 98 - 107 mmol/L    CO2 23.0 20.0 - 31.0 mmol/L    Anion Gap 7 5 - 14 mmol/L    BUN 39 (H) 9 - 23 mg/dL    Creatinine 1.61 (H) 0.73 - 1.18 mg/dL    BUN/Creatinine Ratio 23     eGFR CKD-EPI (2021) Male 44 (L) >=60 mL/min/1.62m2    Glucose 143 70 - 179 mg/dL    Calcium 9.2 8.7 - 09.6 mg/dL    Albumin 3.6 3.4 - 5.0 g/dL    Total Protein 6.8 5.7 - 8.2 g/dL    Total Bilirubin 0.7 0.3 - 1.2 mg/dL    AST 28 <=04 U/L    ALT 29 10 - 49 U/L    Alkaline Phosphatase 314 (H) 46 - 116 U/L   CBC w/ Differential   Result Value Ref Range    WBC 7.8 3.6 - 11.2 10*9/L    RBC 4.47 4.26 - 5.60 10*12/L    HGB 12.8 (L) 12.9 - 16.5 g/dL    HCT 54.0 (L) 98.1 - 48.0 %    MCV 80.8 77.6 - 95.7 fL    MCH 28.7 25.9 - 32.4 pg    MCHC 35.5 32.0 - 36.0 g/dL    RDW 19.1 47.8 - 29.5 %    MPV 7.5 6.8 - 10.7 fL    Platelet 176 150 - 450 10*9/L    Neutrophils % 68.0 %    Lymphocytes % 18.7 %    Monocytes % 6.7 %    Eosinophils % 5.6 % Basophils % 1.0 %    Absolute Neutrophils 5.3 1.8 - 7.8 10*9/L    Absolute  Lymphocytes 1.5 1.1 - 3.6 10*9/L    Absolute Monocytes 0.5 0.3 - 0.8 10*9/L    Absolute Eosinophils 0.4 0.0 - 0.5 10*9/L    Absolute Basophils 0.1 0.0 - 0.1 10*9/L     *Note: Due to a large number of results and/or encounters for the requested time period, some results have not been displayed. A complete set of results can be found in Results Review.     RADIOLOGY RESULTS   CT Abdomen Pelvis W Contrast  Narrative: EXAM: CT ABDOMEN PELVIS W CONTRAST  ACCESSION: [id number] UN  CLINICAL INDICATION: 67 years old with cancer staging  - C23 - Gallbladder cancer (CMS - HCC)      COMPARISON: 12/01/2022    TECHNIQUE: A helical CT scan of the abdomen and pelvis was obtained following IV contrast from the lung bases through the pubic symphysis. Images were reconstructed in the axial plane. Coronal and sagittal reformatted images were also provided for further evaluation.     FINDINGS:    LOWER CHEST: Chest CT was performed concurrently, will be dictated separately. Please see that report for additional discussion.    LIVER: Liver is normal in size and contour. Stable, linearly oriented, 2 cm hypodensity in segment VIII (2:21) and a rounded lesion along the gallbladder fossa in segment V measuring 1.7 cm (2:43). No new liver lesions are identified.    BILIARY: Gallbladder is surgically absent. There is low-attenuation along the gallbladder fossa, unchanged. No intrahepatic biliary ductal dilation. Common bile duct is normal in caliber.    SPLEEN: Mild heterogeneous enhancement of the spleen without discrete lesion. Spleen is normal in size.    PANCREAS: Atrophic pancreas. No focal lesions.  No ductal dilation.    ADRENAL GLANDS: No suspicious adrenal nodules.    KIDNEYS/URETERS: Kidneys enhance symmetrically. There is no hydronephrosis.    BLADDER: Circumferential bladder wall thickening, may be exaggerated by under distention.    REPRODUCTIVE ORGANS: Prostate gland is normal in size. Small fat-containing left inguinal hernia.    GI TRACT: Postoperative changes of total abdominal colectomy. Stomach is distended with fluid and debris    PERITONEUM, RETROPERITONEUM AND MESENTERY: No pneumoperitoneum or drainable collection. Evolving changes of recent surgery in the ventral abdominal wall and upper peritoneum anterior to the stomach. No significant ascites.    LYMPH NODES: There are a few prominent but subcentimeter short axis celiac axis and retroperitoneal nodes which could be reactive in the setting of recent surgery.    VESSELS: Hepatic and portal veins are patent.  Normal caliber aorta.      BONES and SOFT TISSUES: No aggressive osseous lesions. Evolving operative changes of the ventral abdominal wall.  Impression: --No new sites of metastatic disease in the abdomen or pelvis. Stable hypodense liver lesions.  --Evolving changes of recent surgery in the ventral abdominal wall and peritoneum. Attention on follow-up to ensure continued resolution over time.  --Prominent celiac axis and retroperitoneal lymph nodes, nonspecific but may be reactive in the setting of recent surgery.  --Circumferential bladder wall thickening without significant surrounding inflammation. Correlate for cystitis.  CT Chest Wo Contrast  Narrative: EXAM: CT CHEST WO CONTRAST  DATE: 02/28/2023 9:44 AM  ACCESSION: [id number] UN  DICTATED: 02/28/2023 9:45 AM  INTERPRETATION LOCATION: MAIN CAMPUS    CLINICAL INDICATION: 67 year old male with staging  - C23 - Gallbladder cancer (CMS - HCC).    COMPARISON: March 8.    TECHNIQUE: Contiguous 0.6 or 1 mm and 2 mm axial images were reconstructed through the  chest following a single breath hold helical acquisition.  Images were reformatted in the axial and sagittal planes.  MIP slabs were also constructed.    FINDINGS:    LUNGS AND AIRWAYS: The lungs are clear.  The central airways are patent.     PLEURA: No pleural fluid or pneumothorax.    MEDIASTINUM AND LYMPH NODES: No enlarged intrathoracic or axillary lymph nodes are present.  No other mediastinal abnormality.    HEART AND VASCULATURE:The cardiac chambers are normal in size.  There is no pericardial effusion.  Ascending and descending aorta normal in caliber.  Pulmonary artery normal in size. Coronary calcification. Catheter right atrium.    BONES AND SOFT TISSUES: Unremarkable.    UPPER ABDOMEN: Reported separately.    OTHER: No other significant findings.  Impression: No intrathoracic metastasis.         CARE TEAM     [facility]: Dr. Renae Fickle     [facility] Surgical Oncology: Dr. Tora Duck     FOLLOW UP     Future Appointments   Date Time Provider Department Center   03/22/2023  3:20 PM Francetta Found, MD [facility]   04/18/2023 10:00 AM Gwenith Spitz Philippa Chester, MD [facility]   07/12/2023  2:00 PM [facility] CT RM 4 I[facility] [place]   07/19/2023 12:30 PM Marion Downer, MD [facility]ON[facility]

## 2023-02-28 NOTE — Unmapped (Signed)
Clinical Assessment Needed For: Dose Change  Medication: Capecitabine  Last Fill Date/Day Supply: 6-3 / 21  Refill Too Soon until 6/19  Was previous dose already scheduled to fill: No    Notes to Pharmacist:

## 2023-03-01 NOTE — Unmapped (Signed)
Outpatient Oncology Social Work   Initial Clinical Assessment      Referral:  Keith Lenis, Rn NN in [facility]; financial concerns.     Patient Status:  The patient is a 67 yo male with gallbladder adenocarcinoma.  Patient shared that he has also previously had 3 - 4 other cancer diagnoses, such as lymphoma and colon cancer.  Sw spoke with patient via telephone.     Financial/Insurance:  The patient has [facility] Medicare Advantage.  His household (he and his spouse) income is <250% of the [employer].      The patient shared that he is paying quite a bit for his medication co-pays.  Sw explained that [place] [employer] does not assist patients who already have insurance with a prescription plan.  Patient is aware of [facility] and that he can try that in lieu of his insurance.      Patient also has an outstanding medical bill here at the hospital.  Sw explained about [employer] and agreed to mail him an application.  Patient will bring the application and supporting documents in to his next appointment and Sw can help him turn this in.    Sw explained about the [facility] CPAF gas card program and that CPAF can assist with a household bill up to $250 per year while patients are in active treatment.  Patient would appreciate assistance with gas cards and will bring in a bill to his next appointment.      Psychosocial/Coping:   The patient shared that he has good support from his spouse, family, and his church.  He has two adult children, as does his spouse, and they have two grandchildren.      Sw completed CPAF gas card program application and submitted to Lorn Junes, [employer] Patient Assistance Counselor, for approval.         Sherran Needs, MSW, LCSW  Oncology Outpatient Social Worker  [phone]

## 2023-03-13 NOTE — Unmapped (Signed)
[facility] Clinical Assessment & Refill Coordination Note    Keith Ballard, DOB: 1956-04-01  Phone: [phone] (home) [phone] (work)    All above HIPAA information was verified with patient.     Was a Nurse, learning disability used for this call? No    Specialty Medication(s):   Hematology/Oncology: Capecitabine 500 mg, directions: 1,500 mg twice daily x 14 days on and 7 days off each cycle     Current Outpatient Medications   Medication Sig Dispense Refill    acetaminophen (TYLENOL) 500 MG tablet Take 2 tablets (1,000 mg total) by mouth every eight (8) hours. 90 tablet 0    amoxicillin-clavulanate (AUGMENTIN) 875-125 mg per tablet Take 1 tablet by mouth two (2) times a day. (Patient not taking: Reported on 02/28/2023) 60 tablet 0    capecitabine (XELODA) 500 MG tablet Take 3 tablets (1,500 mg total) by mouth two (2) times a day for 14 days. Only take on days 1 through 14 of each 21-day cycle. Take with full glass of water; within 30 minutes after a meal. Swallow tablets whole; do not cut or crush. 84 tablet 3    diphenoxylate-atropine (LOMOTIL) 2.5-0.025 mg per tablet Take 2 tablets by mouth four (4) times a day. (Patient not taking: Reported on 02/28/2023) 720 tablet 0    enoxaparin (LOVENOX) 40 mg/0.4 mL Syrg Inject 0.4 mL (40 mg total) under the skin daily. (Patient not taking: Reported on 02/28/2023) 11.2 mL 0    hydrOXYzine (ATARAX) 25 MG tablet Take one or two tablets at bedtime as needed for anxiety or sleep 60 tablet 11    insulin glargine (BASAGLAR, LANTUS) 100 unit/mL (3 mL) injection pen Inject 0.15 mL (15 Units total) under the skin nightly. 15 mL 0    JARDIANCE 10 mg tablet TAKE ONE TABLET BY MOUTH DAILY AT 9AM FOR KIDNEY PROTECTION      lidocaine 4 % patch Place 1 patch on the skin daily as needed (pain). Apply for 12 hours then remove for 12 hours. (Patient not taking: Reported on 02/28/2023) 15 patch 0    loperamide (IMODIUM A-D) 2 mg tablet Take 2 tablets (4 mg) by mouth for first loose stool followed by 1 tablet (2 mg) after each loose stool thereafter (maximum of 8 tablets per day). 30 tablet 11    MELATONIN, BULK, MISC by Miscellaneous route. (Patient not taking: Reported on 02/28/2023)      midodrine (PROAMATINE) 5 MG tablet Take 1 tablet (5 mg total) by mouth three (3) times a day. 90 tablet 0    omeprazole (PRILOSEC) 40 MG capsule       ondansetron (ZOFRAN) 8 MG tablet Take 1 tablet (8 mg total) by mouth every eight (8) hours as needed for nausea. 60 tablet 3    pen needle, diabetic 32 gauge x 5/32 (4 mm) Ndle Use with insulin up to 4 times/day as needed. 100 each 0    prochlorperazine (COMPAZINE) 10 MG tablet Take 1 tablet (10 mg total) by mouth every six (6) hours as needed (Nausea/Vomiting). 60 tablet 3    QUEtiapine (SEROQUEL) 100 MG tablet Take 1 tablet (100 mg total) by mouth nightly as needed (sleep and anxiety). 90 tablet 3    venlafaxine (EFFEXOR-XR) 150 MG 24 hr capsule Take 1 capsule (150 mg total) by mouth daily. 90 capsule 3     No current facility-administered medications for this visit.        Changes to medications: Zekiah reports no changes  at this time.    Allergies   Allergen Reactions    Sulfa (Sulfonamide Antibiotics) Hives and Rash       Changes to allergies: No    SPECIALTY MEDICATION ADHERENCE     capecitabine 500 mg: 1 days of medicine on hand      Specialty medication(s) dose(s) confirmed:  dose reduced to 1500 mg twice daily      Are there any concerns with adherence? No    Adherence counseling provided? Not needed    CLINICAL MANAGEMENT AND INTERVENTION      Clinical Benefit Assessment:    Do you feel the medicine is effective or helping your condition? Yes    Clinical Benefit counseling provided? Not needed    Adverse Effects Assessment:    Are you experiencing any side effects? Yes, patient reports experiencing fatigue. Side effect counseling provided: NA    Are you experiencing difficulty administering your medicine? No    Quality of Life Assessment:    Quality of Life    Rheumatology  Oncology  1. What impact has your specialty medication had on the reduction of your daily pain or discomfort level?: Some  2. On a scale of 1-10, how would you rate your ability to manage side effects associated with your specialty medication? (1=no issues, 10 = unable to take medication due to side effects): 2  Dermatology  Cystic Fibrosis          How many days over the past month did your gallbladder cancer  keep you from your normal activities? For example, brushing your teeth or getting up in the morning. 0    Have you discussed this with your provider? Not needed    Acute Infection Status:    Acute infections noted within Epic:  No active infections  Patient reported infection: None    Therapy Appropriateness:    Is therapy appropriate and patient progressing towards therapeutic goals? Yes, therapy is appropriate and should be continued    DISEASE/MEDICATION-SPECIFIC INFORMATION      N/A    Oncology: Is the patient receiving adequate infection prevention treatment? Not applicable  Does the patient have adequate nutritional support? Not applicable    PATIENT SPECIFIC NEEDS     Does the patient have any physical, cognitive, or cultural barriers? No    Is the patient high risk? Yes, patient is taking oral chemotherapy. Appropriateness of therapy as been assessed    Did the patient require a clinical intervention? No    Does the patient require physician intervention or other additional services (i.e., nutrition, smoking cessation, social work)? No    SOCIAL DETERMINANTS OF HEALTH     At the [facility], we have learned that life circumstances - like trouble affording food, housing, utilities, or transportation can affect the health of many of our patients.   That is why we wanted to ask: are you currently experiencing any life circumstances that are negatively impacting your health and/or quality of life? Patient declined to answer    Social Determinants of Health     Financial Resource Strain: Low Risk  (07/29/2020)    Overall Financial Resource Strain (CARDIA)     Difficulty of Paying Living Expenses: Not hard at all   Internet Connectivity: Not on file   Food Insecurity: No Food Insecurity (07/27/2022)    Received from [facility]    Hunger Vital Sign     Worried About Running Out of Food in the Last Year: Never true  Ran Out of Food in the Last Year: Never true   Tobacco Use: Low Risk  (01/31/2023)    Patient History     Smoking Tobacco Use: Never     Smokeless Tobacco Use: Never     Passive Exposure: Not on file   Housing/Utilities: Low Risk  (07/29/2020)    Housing/Utilities     Within the past 12 months, have you ever stayed: outside, in a car, in a tent, in an overnight shelter, or temporarily in someone else's home (i.e. couch-surfing)?: No     Are you worried about losing your housing?: No     Within the past 12 months, have you been unable to get utilities (heat, electricity) when it was really needed?: No   Alcohol Use: Not At Risk (06/16/2021)    Alcohol Use     How often do you have a drink containing alcohol?: Never     How many drinks containing alcohol do you have on a typical day when you are drinking?: 1 - 2     How often do you have 5 or more drinks on one occasion?: Never   Transportation Needs: No Transportation Needs (07/27/2022)    Received from [facility] - Transportation     Lack of Transportation (Medical): No     Lack of Transportation (Non-Medical): No   Substance Use: Low Risk  (06/16/2021)    Substance Use     Taken prescription drugs for non-medical reasons: Never     Taken illegal drugs: Never     Patient indicated they have taken drugs in the past year for non-medical reasons: Yes, [positive answer(s)]: Not on file   Health Literacy: Medium Risk (06/16/2021)    Health Literacy     : Rarely   Physical Activity: Sufficiently Active (06/16/2021)    Exercise Vital Sign     Days of Exercise per Week: 4 days     Minutes of Exercise per Session: 50 min   Interpersonal Safety: Not on file   Stress: No Stress Concern Present (06/16/2021)    [employer] of Occupational Health - Occupational Stress Questionnaire     Feeling of Stress : Only a little   Intimate Partner Violence: Not At Risk (07/27/2022)    Received from [facility]    Humiliation, Afraid, Rape, and Kick questionnaire     Fear of Current or Ex-Partner: No     Emotionally Abused: No     Physically Abused: No     Sexually Abused: No   Depression: Not at risk (10/26/2022)    PHQ-2     PHQ-2 Score: 0   Social Connections: Moderately Integrated (06/16/2021)    Social Connection and Isolation Panel [NHANES]     Frequency of Communication with Friends and Family: More than three times a week     Frequency of Social Gatherings with Friends and Family: Never     Attends Religious Services: More than 4 times per year     Active Member of [employer] or Organizations: No     Attends Banker Meetings: Never     Marital Status: Married       Would you be willing to receive help with any of the needs that you have identified today? Not applicable       SHIPPING     Specialty Medication(s) to be Shipped:   Hematology/Oncology: Capecitabine 500 mg, directions: 1500 mg twice daily x 14 days on and 7 days  off    Other medication(s) to be shipped: No additional medications requested for fill at this time     Changes to insurance: No    Delivery Scheduled: Yes, Expected medication delivery date: 03/19/23.     Medication will be delivered via Next Day Courier to the confirmed prescription address in [facility].    The patient will receive a drug information handout for each medication shipped and additional FDA Medication Guides as required.  Verified that patient has previously received a Conservation officer, historic buildings and a Surveyor, mining.    The patient or caregiver noted above participated in the development of this care plan and knows that they can request review of or adjustments to the care plan at any time.      All of the patient's questions and concerns have been addressed.    Kermit Balo, [facility]   [facility] Specialty Pharmacist

## 2023-03-22 ENCOUNTER — Ambulatory Visit: Admit: 2023-03-22 | Discharge: 2023-03-22 | Payer: MEDICARE | Attending: "Endocrinology | Primary: "Endocrinology

## 2023-03-22 ENCOUNTER — Other Ambulatory Visit: Admit: 2023-03-22 | Discharge: 2023-03-22 | Payer: MEDICARE

## 2023-03-22 ENCOUNTER — Ambulatory Visit: Admit: 2023-03-22 | Discharge: 2023-03-22 | Payer: MEDICARE

## 2023-03-22 DIAGNOSIS — E861 Hypovolemia: Principal | ICD-10-CM

## 2023-03-22 DIAGNOSIS — Z79899 Other long term (current) drug therapy: Principal | ICD-10-CM

## 2023-03-22 DIAGNOSIS — C23 Malignant neoplasm of gallbladder: Principal | ICD-10-CM

## 2023-03-22 LAB — CBC W/ AUTO DIFF
BASOPHILS ABSOLUTE COUNT: 0 10*9/L (ref 0.0–0.1)
BASOPHILS RELATIVE PERCENT: 1.1 %
EOSINOPHILS ABSOLUTE COUNT: 0.3 10*9/L (ref 0.0–0.5)
EOSINOPHILS RELATIVE PERCENT: 6.7 %
HEMATOCRIT: 35.5 % — ABNORMAL LOW (ref 39.0–48.0)
HEMOGLOBIN: 12.5 g/dL — ABNORMAL LOW (ref 12.9–16.5)
LYMPHOCYTES ABSOLUTE COUNT: 1 10*9/L — ABNORMAL LOW (ref 1.1–3.6)
LYMPHOCYTES RELATIVE PERCENT: 24.1 %
MEAN CORPUSCULAR HEMOGLOBIN CONC: 35.1 g/dL (ref 32.0–36.0)
MEAN CORPUSCULAR HEMOGLOBIN: 29.2 pg (ref 25.9–32.4)
MEAN CORPUSCULAR VOLUME: 83.2 fL (ref 77.6–95.7)
MEAN PLATELET VOLUME: 7.2 fL (ref 6.8–10.7)
MONOCYTES ABSOLUTE COUNT: 0.5 10*9/L (ref 0.3–0.8)
MONOCYTES RELATIVE PERCENT: 13 %
NEUTROPHILS ABSOLUTE COUNT: 2.3 10*9/L (ref 1.8–7.8)
NEUTROPHILS RELATIVE PERCENT: 55.1 %
PLATELET COUNT: 130 10*9/L — ABNORMAL LOW (ref 150–450)
RED BLOOD CELL COUNT: 4.27 10*12/L (ref 4.26–5.60)
RED CELL DISTRIBUTION WIDTH: 14 % (ref 12.2–15.2)
WBC ADJUSTED: 4.2 10*9/L (ref 3.6–11.2)

## 2023-03-22 LAB — COMPREHENSIVE METABOLIC PANEL
ALBUMIN: 3.6 g/dL (ref 3.4–5.0)
ALKALINE PHOSPHATASE: 222 U/L — ABNORMAL HIGH (ref 46–116)
ALT (SGPT): 35 U/L (ref 10–49)
ANION GAP: 9 mmol/L (ref 5–14)
AST (SGOT): 51 U/L — ABNORMAL HIGH (ref <=34)
BILIRUBIN TOTAL: 1 mg/dL (ref 0.3–1.2)
BLOOD UREA NITROGEN: 27 mg/dL — ABNORMAL HIGH (ref 9–23)
BUN / CREAT RATIO: 15
CALCIUM: 8.8 mg/dL (ref 8.7–10.4)
CHLORIDE: 103 mmol/L (ref 98–107)
CO2: 22 mmol/L (ref 20.0–31.0)
CREATININE: 1.8 mg/dL — ABNORMAL HIGH
EGFR CKD-EPI (2021) MALE: 40 mL/min/{1.73_m2} — ABNORMAL LOW (ref >=60)
GLUCOSE RANDOM: 217 mg/dL — ABNORMAL HIGH (ref 70–179)
POTASSIUM: 4.1 mmol/L (ref 3.4–4.8)
PROTEIN TOTAL: 6.9 g/dL (ref 5.7–8.2)
SODIUM: 134 mmol/L — ABNORMAL LOW (ref 135–145)

## 2023-03-22 NOTE — Unmapped (Signed)
Dehydration    Drink 64 ounces of water a day  Use a clear water bottle with measurements on the side so you know how much you have already consumed  Keep it next to you and sip throughout the day -- no need to drink it all at once  Avoid caffeinated drinks (coffee, decaf coffee, tea, caffeinated soda), or limit to one a day  Eat foods with high water content. While drinking water is the best source of hydration, many foods contain water and can help replenish lost fluids. Choose foods like lettuce (95% water), watermelon (92% water), and broccoli (91% water). Soups, popsicles, and yogurt also have high water content.  Eat small amounts of soft bland low fiber foods frequently.  Examples: banana, rice, noodles, white bread, skinned chicken, [place] or mild white fish.  Avoid foods such as:    Greasy, fatty, or fried foods.  Raw vegetables or fruits.  Strong spices.  Whole grains breads and cereals, nuts, and popcorn.  Gas forming foods & beverages (beans, cabbage, carbonated beverages).  Lactose-containing products, supplements, or alcohol  Limit foods and beverages with caffeine and beverages extremely hot or cold.     DIARRHEA    Alternate 2 Lomotil with 2 Imodium every 3 hours around the clock. BP today is lower then baseline.     Be sure to replace all fluids that you have lost by drinking plenty of water and other fluid, such as clear broth, sports drinks such as Gatorade, or ginger ale. If you choose a carbonated beverage, let it sit for a while until it loses its carbonation.    Eat small meals throughout the day instead of three large meals.    Avoid milk and milk products, including ice cream, as diary may aggravate your symptoms.                Keep the rectal area clean and dry, using a mild soap.

## 2023-03-22 NOTE — Unmapped (Signed)
[facility] RETURN VISIT        PATIENT IDENTIFICATION   Keith HENNESY is a 67 y.o. 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      CANCER STAGING     Cancer Staging   Gallbladder cancer (CMS-HCC)  Staging form: Gallbladder, AJCC 8th Edition  - Pathologic: Stage IIB (pT2b, pN0, cM0) - Signed by Jeannine Boga, MD on 01/31/2023      ONCOLOGIC HISTORY     Gallbladder adenocarcinoma, pT2bN0  07/26/22, OSH CT for lymphoma treatment response assessment incidentally revealed multiple areas of soft tissue nodularity along the gallbladder wall the largest measuring up to 1.2 cm near the fundus.  08/30/22, [place] RUQ gallbladder showed indeterminant polypoid lesion with internal vascularity arising from the wall of the gallbladder.   10/15/22, MRI abdomen enhancing intraluminal gallbladder lesions measuring 1.8 and 0.6 cm. Detailed anatomy is limited due to motion artifact. However grossly, there is no appreciable wall thickening or evidence of invasion. Gallbladder polyps/ primary gallbladder neoplasm is favored over lymphoma.  10/26/22, CA 19-9=15.3  11/13/22, cholecystectomy and liver biopsy; Pathology showed a 1.5 cm moderately to poorly differentiated adenocarcinoma with micropapillary features invading through the muscularis propria into the peribiliary connective tissue, cystic duct and liver bed margins are negative, +LVI, +PNI. Segment 5 biopsy (prior lymphoma site) negative for carcinoma. Segment 4 biopsy showed features of periductal concentric fibrosis and focal copper accumulation and minimal macrovascular steatosis, negative for carcinoma.  12/26/22, portal lymphadenectomy and segment 4B/5 partial hepatectomy; Final path negative for carcinoma.  01/31/23, initial consultation at [facility]  02/08/23, DPYD phenotype normal.  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      MOLECULAR DIAGNOSTICS     N/A    ASSESSMENT AND PLAN      # Gallbladder adenocarcinoma, pT2bN0  Incidentally identified on cholecystectomy specimen now s/p portal lymphadenectomy and segment 4B/5 partial hepatectomy on 12/26/22.     Keith Ballard is here for follow up while on Xeloda for evaluation of increased stool frequency and dizziness. He started taking capecitabine on 02/26/2023.  Labs are stable, but CrCl is 42 ml/min down from 46 mL/min. Will dose reduce capecitabine based on renal function.     PLAN  - Capecitabine 1500 mg po twice daily on days 1-14 and off for 7 days  - RTC on 7/12 for labs and follow up  - Written instructions on how to manage chemotherapy related nausea/vomiting, diarrhea, dehydration and neutropenia precautions given to patient     Diarrhea: Given uncertainty is he's been taking antidiarrheals correctly recommended him to alternate 2 Lomotil with 2 Imodium every 3 hours around the clock. BP today is lower then baseline. Declined fluids. Encouraged to increase fluids.     Orders Placed This Encounter   Procedures    CBC w/ Differential    Comprehensive Metabolic Panel    Orthostatic blood pressure     The natural history of cancer was reviewed and discussed with the patient and family members. Treatment options as well as risks and benefits were discussed with the patient/family. Patient and family member's questions answered to their satisfaction    INTERVAL HISTORY   Keith Ballard is here for  follow up. He is doing ok with exception of ongoing unsteadiness. Refuses to use cane. No recent falls, but does have history of falls. Since starting Xeloda, he feels that his stool consistency has decreased from a thicker consistency to watery with a few more trips to the bathroom. Previously was going to the bathroom about every 2 hours and now he's going about every hour and half. Denies dark or bloody stools. Takes imodium as Rx, but rare use of lomotil, but unclear. Is experiencing some dizziness upon standing. States he's drinking water and Gatorade, but does report quite a bit of cola intake. Has lost 3 lbs from last encounter. Denies missing or skipping meals.     he denies fevers, chills and sweats. he  has no chest pain or dyspnea. he  has no mouth sores. he  denies nausea and vomiting. No LE edema, rash or neurological complains.    REVIEW OF SYSTEMS   The remainder of a comprehensive 10 systems review is negative.     Allergies and Medications reviewed as per chart    PHYSICAL EXAMS     Vitals:    03/22/23 1312   BP: 91/56   Pulse: 84   Resp: 18   Temp: 36.7 ??C (98 ??F)   SpO2: 98%               03/22/23 75.7 kg (166 lb 12.8 oz)   02/28/23 77 kg (169 lb 11.2 oz)   02/14/23 75.3 kg (166 lb)      ECOG: (1) Restricted in physically strenuous activity, ambulatory and able to do work of light nature    GENERAL: well developed, well nourished, in no distress  PSYCH: full and appropriate range of affect with good insight and judgement  HEENT: NCAT, pupils equal, sclerae anicteric   EXT: warm and well perfused, no edema  SKIN: No rashes       OBJECTIVE DATA   LABS:   Results for orders placed or performed in visit on 03/22/23   Comprehensive Metabolic Panel   Result Value Ref Range    Sodium 134 (L) 135 - 145 mmol/L    Potassium 4.1 3.4 - 4.8 mmol/L    Chloride 103 98 - 107 mmol/L    CO2 22.0 20.0 - 31.0 mmol/L    Anion Gap 9 5 - 14 mmol/L    BUN 27 (H) 9 - 23 mg/dL    Creatinine 5.40 (H) 0.73 - 1.18 mg/dL    BUN/Creatinine Ratio 15     eGFR CKD-EPI (2021) Male 40 (L) >=60 mL/min/1.59m2    Glucose 217 (H) 70 - 179 mg/dL    Calcium 8.8 8.7 - 98.1 mg/dL    Albumin 3.6 3.4 - 5.0 g/dL    Total Protein 6.9 5.7 - 8.2 g/dL    Total Bilirubin 1.0 0.3 - 1.2 mg/dL    AST 51 (H) <=19 U/L    ALT 35 10 - 49 U/L    Alkaline Phosphatase 222 (H) 46 - 116 U/L   CBC w/ Differential   Result Value Ref Range    WBC 4.2 3.6 - 11.2 10*9/L    RBC 4.27 4.26 - 5.60 10*12/L    HGB 12.5 (L) 12.9 - 16.5 g/dL    HCT 14.7 (L) 82.9 - 48.0 %    MCV 83.2 77.6 - 95.7 fL    MCH 29.2 25.9 - 32.4 pg    MCHC 35.1 32.0 - 36.0 g/dL    RDW 56.2 13.0 -  15.2 %    MPV 7.2 6.8 - 10.7 fL    Platelet 130 (L) 150 - 450 10*9/L    Neutrophils % 55.1 %    Lymphocytes % 24.1 %    Monocytes % 13.0 %    Eosinophils % 6.7 %    Basophils % 1.1 %    Absolute Neutrophils 2.3 1.8 - 7.8 10*9/L    Absolute Lymphocytes 1.0 (L) 1.1 - 3.6 10*9/L    Absolute Monocytes 0.5 0.3 - 0.8 10*9/L    Absolute Eosinophils 0.3 0.0 - 0.5 10*9/L    Absolute Basophils 0.0 0.0 - 0.1 10*9/L     *Note: Due to a large number of results and/or encounters for the requested time period, some results have not been displayed. A complete set of results can be found in Results Review.     RADIOLOGY RESULTS   CT Abdomen Pelvis W Contrast  Narrative: EXAM: CT ABDOMEN PELVIS W CONTRAST  ACCESSION: [id number] UN  CLINICAL INDICATION: 66 years old with cancer staging  - C23 - Gallbladder cancer (CMS - HCC)      COMPARISON: 12/01/2022    TECHNIQUE: A helical CT scan of the abdomen and pelvis was obtained following IV contrast from the lung bases through the pubic symphysis. Images were reconstructed in the axial plane. Coronal and sagittal reformatted images were also provided for further evaluation.     FINDINGS:    LOWER CHEST: Chest CT was performed concurrently, will be dictated separately. Please see that report for additional discussion.    LIVER: Liver is normal in size and contour. Stable, linearly oriented, 2 cm hypodensity in segment VIII (2:21) and a rounded lesion along the gallbladder fossa in segment V measuring 1.7 cm (2:43). No new liver lesions are identified.    BILIARY: Gallbladder is surgically absent. There is low-attenuation along the gallbladder fossa, unchanged. No intrahepatic biliary ductal dilation. Common bile duct is normal in caliber.    SPLEEN: Mild heterogeneous enhancement of the spleen without discrete lesion. Spleen is normal in size.    PANCREAS: Atrophic pancreas. No focal lesions.  No ductal dilation.    ADRENAL GLANDS: No suspicious adrenal nodules.    KIDNEYS/URETERS: Kidneys enhance symmetrically. There is no hydronephrosis.    BLADDER: Circumferential bladder wall thickening, may be exaggerated by under distention.    REPRODUCTIVE ORGANS: Prostate gland is normal in size. Small fat-containing left inguinal hernia.    GI TRACT: Postoperative changes of total abdominal colectomy. Stomach is distended with fluid and debris    PERITONEUM, RETROPERITONEUM AND MESENTERY: No pneumoperitoneum or drainable collection. Evolving changes of recent surgery in the ventral abdominal wall and upper peritoneum anterior to the stomach. No significant ascites.    LYMPH NODES: There are a few prominent but subcentimeter short axis celiac axis and retroperitoneal nodes which could be reactive in the setting of recent surgery.    VESSELS: Hepatic and portal veins are patent.  Normal caliber aorta.      BONES and SOFT TISSUES: No aggressive osseous lesions. Evolving operative changes of the ventral abdominal wall.  Impression: --No new sites of metastatic disease in the abdomen or pelvis. Stable hypodense liver lesions.  --Evolving changes of recent surgery in the ventral abdominal wall and peritoneum. Attention on follow-up to ensure continued resolution over time.  --Prominent celiac axis and retroperitoneal lymph nodes, nonspecific but may be reactive in the setting of recent surgery.  --Circumferential bladder wall thickening without significant surrounding inflammation. Correlate for cystitis.  CT Chest  Wo Contrast  Narrative: EXAM: CT CHEST WO CONTRAST  DATE: 02/28/2023 9:44 AM  ACCESSION: [id number] UN  DICTATED: 02/28/2023 9:45 AM  INTERPRETATION LOCATION: MAIN CAMPUS    CLINICAL INDICATION: 67 year old male with staging  - C23 - Gallbladder cancer (CMS - HCC).    COMPARISON: March 8.    TECHNIQUE: Contiguous 0.6 or 1 mm and 2 mm axial images were reconstructed through the chest following a single breath hold helical acquisition.  Images were reformatted in the axial and sagittal planes.  MIP slabs were also constructed.    FINDINGS:    LUNGS AND AIRWAYS: The lungs are clear.  The central airways are patent.     PLEURA: No pleural fluid or pneumothorax.    MEDIASTINUM AND LYMPH NODES: No enlarged intrathoracic or axillary lymph nodes are present.  No other mediastinal abnormality.    HEART AND VASCULATURE:The cardiac chambers are normal in size.  There is no pericardial effusion.  Ascending and descending aorta normal in caliber.  Pulmonary artery normal in size. Coronary calcification. Catheter right atrium.    BONES AND SOFT TISSUES: Unremarkable.    UPPER ABDOMEN: Reported separately.    OTHER: No other significant findings.  Impression: No intrathoracic metastasis.         CARE TEAM     [facility]: Dr. Renae Fickle     [facility]: Dr. Tora Duck     FOLLOW UP     Future Appointments   Date Time Provider Department Center   03/22/2023  3:20 PM Francetta Found, MD [facility]   04/05/2023  8:00 AM ADULT ONC LAB [facility]   04/05/2023  9:00 AM Jeannine Boga, MD ONCMULTI [facility]   04/18/2023 10:00 AM Modena Nunnery, MD [facility]   07/12/2023  2:00 PM [facility] CT RM 4 I[facility] [place]   07/19/2023 12:30 PM Marion Downer, MD SURONC [facility]

## 2023-03-22 NOTE — Unmapped (Unsigned)
[facility]   Outpatient Visit    Clinic Date: 03/22/2023    PCP: Gae Bon, MD    Patient Name: Keith Ballard  Date of Birth: 24-Aug-1956    Reason for visit: Type 2 Diabetes    Assessment/Plan:   Cardier Koob is a 67 y.o. male who presents for follow-up of type 2 diabetes.    There are no diagnoses linked to this encounter.    Type 2 Diabetes:    When To Check Blood Glucose Level:  Before Breakfast Before Lunch Before Dinner Before Bed   X X X X     Basal Insulin (long acting): ***   Before Breakfast Before Lunch Before Dinner Before Bed         ***     Prandial Insulin (before meals): ***   Before Breakfast Before Lunch Before Dinner Before Bed   *** *** ***       Correction insulin: ***    Other diabetes medications: ***     Blood Pressure:  BP goal <130/80. Today's level ***  - Continue current regimen ***     Cholesterol:  LDL goal < 100. Last LDL <30 in 2019  Continue current regimen with ***statin.    Complication Screening:  Nephropathy: *** history of diabetic nephropathy, latest urine microalbumin undetectable in 2020, due for next screening today  Retinopathy: *** history of diabetic retinopathy, latest dilated retinal exam *** in ***, due for next screening ***  Neuropathy: *** history of diabetic neuropathy, latest foot exam *** in ***, due for next screening ***    No follow-ups on file.    Francetta Found, MD, MPH  [facility]   Phone:  [phone]     Fax:  [phone]    Subjective:       Keith Ballard is a 67 y.o. male PMH lymphoma, gall bladder adenocarcinoma with recent cholecystectomy and partial hepatic resection now on capecitabine who presents for evaluation of Type 2 diabetes mellitus.     He was referred by his gastroenterologist who would like him to be treated with a GLP1.    Diabetes History  Diagnosed: ***     Complications: ***, ***glycemic crises    Treatment history: ***     Current Management  Current diabetes regimen:  Lantus 15 units daily  ?Jardiance 10mg  daily    Current diet/exercise:   ***    Glucose data   (***meter, CGM) downloaded (***[employer], [employer], Clarity, [employer] Source, [facility]) x*** days and reviewed with patient in clinic.    ***snapshot view    ***Patterns include    ***hypoglycemia      Past Medical History:   Diagnosis Date    Acute kidney failure (CMS-HCC) 05/24/2020    Anxiety     Arthritis     Cataract     Colon cancer (CMS-HCC)     s/p colectomy    Depression     Essential hypertension 09/04/2016    GERD (gastroesophageal reflux disease)     Left hip pain 09/12/2015    Peptic ulceration     PTSD (post-traumatic stress disorder)     Risk for falls 01/15/2023    Sepsis (CMS-HCC) 06/12/2020    Sleep apnea     Sleep apnea 2015    Strep throat     Type 2 diabetes mellitus without complication (CMS-HCC) 08/02/2015    UC (ulcerative colitis) (CMS-HCC)  Current Outpatient Medications:     acetaminophen (TYLENOL) 500 MG tablet, Take 2 tablets (1,000 mg total) by mouth every eight (8) hours., Disp: 90 tablet, Rfl: 0    capecitabine (XELODA) 500 MG tablet, Take 3 tablets (1,500 mg total) by mouth two (2) times a day for 14 days. Only take on days 1 through 14 of each 21-day cycle. Take with full glass of water; within 30 minutes after a meal. Swallow tablets whole; do not cut or crush., Disp: 84 tablet, Rfl: 3    diphenoxylate-atropine (LOMOTIL) 2.5-0.025 mg per tablet, Take 2 tablets by mouth four (4) times a day. (Patient not taking: Reported on 02/28/2023), Disp: 720 tablet, Rfl: 0    enoxaparin (LOVENOX) 40 mg/0.4 mL Syrg, Inject 0.4 mL (40 mg total) under the skin daily. (Patient not taking: Reported on 02/28/2023), Disp: 11.2 mL, Rfl: 0    hydrOXYzine (ATARAX) 25 MG tablet, Take one or two tablets at bedtime as needed for anxiety or sleep, Disp: 60 tablet, Rfl: 11    insulin glargine (BASAGLAR, LANTUS) 100 unit/mL (3 mL) injection pen, Inject 0.15 mL (15 Units total) under the skin nightly., Disp: 15 mL, Rfl: 0    JARDIANCE 10 mg tablet, TAKE ONE TABLET BY MOUTH DAILY AT 9AM FOR KIDNEY PROTECTION, Disp: , Rfl:     lidocaine 4 % patch, Place 1 patch on the skin daily as needed (pain). Apply for 12 hours then remove for 12 hours. (Patient not taking: Reported on 02/28/2023), Disp: 15 patch, Rfl: 0    loperamide (IMODIUM A-D) 2 mg tablet, Take 2 tablets (4 mg) by mouth for first loose stool followed by 1 tablet (2 mg) after each loose stool thereafter (maximum of 8 tablets per day)., Disp: 30 tablet, Rfl: 11    MELATONIN, BULK, MISC, by Miscellaneous route. (Patient not taking: Reported on 02/28/2023), Disp: , Rfl:     omeprazole (PRILOSEC) 40 MG capsule, , Disp: , Rfl:     ondansetron (ZOFRAN) 8 MG tablet, Take 1 tablet (8 mg total) by mouth every eight (8) hours as needed for nausea., Disp: 60 tablet, Rfl: 3    pen needle, diabetic 32 gauge x 5/32 (4 mm) Ndle, Use with insulin up to 4 times/day as needed., Disp: 100 each, Rfl: 0    prochlorperazine (COMPAZINE) 10 MG tablet, Take 1 tablet (10 mg total) by mouth every six (6) hours as needed (Nausea/Vomiting)., Disp: 60 tablet, Rfl: 3    QUEtiapine (SEROQUEL) 100 MG tablet, Take 1 tablet (100 mg total) by mouth nightly as needed (sleep and anxiety)., Disp: 90 tablet, Rfl: 3    venlafaxine (EFFEXOR-XR) 150 MG 24 hr capsule, Take 1 capsule (150 mg total) by mouth daily., Disp: 90 capsule, Rfl: 3    Allergies   Allergen Reactions    Sulfa (Sulfonamide Antibiotics) Hives and Rash       Relevant family history: ***    Social history: ***    Review of Systems  ***No polyuria, polydipsia, vision changes, or skin/feet problems. Remainder of review of systems was negative except for pertinent items noted in the HPI.     Objective:      Physical Exam   There were no vitals taken for this visit.  Gen- well-appearing  HEENT-mmm  Neck- thyroid***  CV-RRR  Abd- soft NTND  Injection/pump/CGM sites: ***  Foot exam:    Pulses - DP/PT pulses palpable***   Monofilament test {monofilament test detailed:[id number]}   Visual foot inspection - {visual foot  detail:[id number]}      Lab Review  Lab Results   Component Value Date    HGB A1C, POC 7.2 (H) 11/26/2018    HGB A1C, POC 8.5 (H) 10/07/2018    HGB A1C, POC 8.5 (H) 05/27/2018    HGB A1C, POC 11.9 (H) 11/25/2017    Hemoglobin A1C 8.6 (H) 12/27/2022    Hemoglobin A1C >14.0 (H) 06/15/2021    Hemoglobin A1C 5.6 07/27/2020    Hemoglobin A1C 5.3 04/27/2020    TSH 2.030 08/13/2015    Tissue Transglut Ab 0.7 08/04/2014    TTG Interpretation see below 08/04/2014    IgA 471 (H) 08/04/2014    LDL Direct <30.0 (L) 12/23/2017    AST 28 02/28/2023    AST 36 09/01/2014    ALT 29 02/28/2023    ALT 38 09/01/2014    Creat U 45.0 06/12/2020    Albumin Quantitative, Urine <0.6 11/26/2018    Albumin/Creatinine Ratio  11/26/2018      Comment:      Unable to calculate due to value below lower limit of assay linearity.     ***    Radiology  ***    Billing:  I spent *** minutes face-to-face and non-face-to-face in the care of this patient, which includes all pre, intra, and post visit time on the date of service.  All documented time was specific to the E/M visit and does not include any procedures that may have been performed.

## 2023-03-22 NOTE — Unmapped (Unsigned)
Port accessed.  Labs drawn & sent for analysis.  Port flushed, heparin-locked & de-accessed. Patient sent to next appointment.   Care provided by Jenean Lindau, RN

## 2023-03-25 NOTE — Unmapped (Signed)
Clinical Pharmacist Practitioner: GI Oncology Clinic    Patient Name: Keith Ballard  Patient Age: 67 y.o.  Encounter Date: 03/25/2023  Primary Oncologist: Keith Boga, MD    Reason for visit: oral chemotherapy follow-up    ASSESSMENT & PLAN:  1. Gallbladder adenocarcinoma:  - Continue capecitabine 1500 mg PO BID on days 1-14 of each 21-day cycle    2. Diarrhea: Grade 2. Has not been scheduling loperamide but states he is taking it ~6 times/day for a total of ~12 capsules/day. He has not started Lomotil but will pick it up from local pharmacy and inform us if there is any issue. Discussed importance of hydration, eating small meals throughout day, potentially avoiding dairy products, avoiding greasy/fat foods, avoiding caffeine.   - Alternate 2 Lomotil with 2 Imodium every 2-4 hours around the clock (may do 2 hours during daytime, 4 hours during nighttime)   - Ensure adequate hydration    3. Dizziness: may be due to orthostatic hypotension as patient states it is primarily when getting up from sitting or laying down. Discussed importance of getting up slowly and avoiding activities that may increase fall risk.   - Ensure adequate hydration    F/U: appt w/Dr. Timoteo Ballard on 04/05/23    ______________________________________________________________________    HPI: Keith Ballard is a 67 y.o. 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     Oral chemotherapy: capecitabine 1500 mg (500 mg x3) PO BID on days 1-14 of each 21-day cycle  Start date: ~02/26/23  Specialty pharmacy: [facility]    Interim History: Keith Ballard was seen in clinic on Friday for concern for diarrhea and dizziness on capecitabine. He had reported stool urgency about every 2 hours and then every 1.5 hours, had postural dizziness, was hypotensive but not tachycardic on Friday. He had declined IV fluids and labs were ok with exception of worse CrCl at 42 down from 46 ml/min. Given uncertainty is he's been taking antidiarrheals correctly, he was recommended him to alternate 2 Lomotil with 2 Imodium every 3 hours around the clock and encouraged to increase fluids and limit caffeine.     Today patient was contacted via telephone to check in on patient. He states he feels the same. He reports he is taking one of the two antidiarrheals and thinks it is the loperamide but not the Lomotil. He states he is having diarrhea every hour (baseline very 2 hours). He has a J pouch. His stools are always watery when he does not have diarrhea (more milky consistency) but now his stools are a bit more watery and are more frequent (every hour). He is taking loperamide it when he feels like he needs it rather than scheduled, which he states ends up being  around 2 capsules about 6 times per day for a total of ~12 capsules per day. He thinks it has helped some. He reports his diet varies but consists of meat, rice potatoes, vegetable, fruit. He states he eats 1 full meal and 4 snacks during day. He endorses having some milk, such as with cereal and occasionally having a soft drink with caffeine. He states he alternates between water and Gatorade about every 2 hours.He reports his dizziness is about the same, and usually occurs when getting up too fast     Adherence: denies missed doses  Drug-Drug Interactions: Qtc prolongation with venlafaxine and ondansetron, risk of decreased efficacy of capecitabine with  omeprazole - did not assess if pt is taking (Monitor efficacy while on concurrent pantoprazole (some lower quality data suggesting possible DDI, although the interaction is doubtful based on limited clinical documentation, contradictory PK results, and inconsistent in vitro results: J Oncol Pharm Pract. 2019 Oct;25(7):1705-1711).)     Oncology History:  Hematology/Oncology History   Non-Hodgkin lymphoma (CMS-HCC) (Resolved)   05/28/2020 Initial Diagnosis    Lymphoma involving liver (CMS-HCC)     06/01/2020 - 06/01/2020 Chemotherapy    IP LYMPHOMA INTRATHECAL TRIPLE  cytarabine 100 mg intrathecal     06/03/2020 - 06/10/2020 Chemotherapy    IP/OP LYMPHOMA R-DA-EPOCH (OP RITUXIMAB + PEGFILGRASTIM ON DAY 6) (Q24H)  riTUXimab 375 mg/m2 IV on day 6, etoposide 50 mg/m2 days 1 to 4, DOXOrubicin 10 mg/m2 days 1 to 4, vinCRIStine 0.4 mg/m2 days 1 to 4, cyclophosphamide 750 mg/m2 day 5, prednisone days 1 to 5     07/15/2020 - 10/06/2020 Chemotherapy    OP LYMPHOMA R-CHOP (EVERY 21 DAYS)  riTUXimab 375 mg/m2 (standard IV, rapid infusion IV, or riTUXimab Hycela 1,400 mg SQ) on day 1, vinCRIStine 1.4 mg/m2 (max 2 mg) IV on day 1, DOXOrubicin 50 mg/m2 IV on day 1, cyclophosphamide 750 mg/m2 IV on day 1, predniSONE 100 mg PO on days 1-5, every 21-day cycle     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)   06/12/2020 Initial Diagnosis    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)     07/15/2020 - 10/06/2020 Chemotherapy    OP LYMPHOMA R-CHOP (EVERY 21 DAYS)  riTUXimab 375 mg/m2 (standard IV, rapid infusion IV, or riTUXimab Hycela 1,400 mg SQ) on day 1, vinCRIStine 1.4 mg/m2 (max 2 mg) IV on day 1, DOXOrubicin 50 mg/m2 IV on day 1, cyclophosphamide 750 mg/m2 IV on day 1, predniSONE 100 mg PO on days 1-5, every 21-day cycle     Gallbladder cancer (CMS-HCC)   01/31/2023 Initial Diagnosis    Gallbladder cancer (CMS-HCC)     01/31/2023 -  Cancer Staged    Staging form: Gallbladder, AJCC 8th Edition  - Pathologic: Stage IIB (pT2b, pN0, cM0) - Signed by Keith Boga, MD on 01/31/2023           Medications:  Current Outpatient Medications   Medication Sig Dispense Refill    acetaminophen (TYLENOL) 500 MG tablet Take 2 tablets (1,000 mg total) by mouth every eight (8) hours. 90 tablet 0    capecitabine (XELODA) 500 MG tablet Take 3 tablets (1,500 mg total) by mouth two (2) times a day for 14 days. Only take on days 1 through 14 of each 21-day cycle. Take with full glass of water; within 30 minutes after a meal. Swallow tablets whole; do not cut or crush. 84 tablet 3    diphenoxylate-atropine (LOMOTIL) 2.5-0.025 mg per tablet Take 2 tablets by mouth four (4) times a day. 720 tablet 0    enoxaparin (LOVENOX) 40 mg/0.4 mL Syrg Inject 0.4 mL (40 mg total) under the skin daily. (Patient not taking: Reported on 02/28/2023) 11.2 mL 0    hydrOXYzine (ATARAX) 25 MG tablet Take one or two tablets at bedtime as needed for anxiety or sleep 60 tablet 11    insulin glargine (BASAGLAR, LANTUS) 100 unit/mL (3 mL) injection pen Inject 0.15 mL (15 Units total) under the skin nightly. 15 mL 0    JARDIANCE 10 mg tablet TAKE ONE TABLET BY MOUTH DAILY AT 9AM FOR KIDNEY PROTECTION  lidocaine 4 % patch Place 1 patch on the skin daily as needed (pain). Apply for 12 hours then remove for 12 hours. (Patient not taking: Reported on 02/28/2023) 15 patch 0    loperamide (IMODIUM A-D) 2 mg tablet Take 2 tablets (4 mg) by mouth for first loose stool followed by 1 tablet (2 mg) after each loose stool thereafter (maximum of 8 tablets per day). 30 tablet 11    MELATONIN, BULK, MISC by Miscellaneous route. (Patient not taking: Reported on 02/28/2023)      omeprazole (PRILOSEC) 40 MG capsule       ondansetron (ZOFRAN) 8 MG tablet Take 1 tablet (8 mg total) by mouth every eight (8) hours as needed for nausea. 60 tablet 3    pen needle, diabetic 32 gauge x 5/32 (4 mm) Ndle Use with insulin up to 4 times/day as needed. 100 each 0    prochlorperazine (COMPAZINE) 10 MG tablet Take 1 tablet (10 mg total) by mouth every six (6) hours as needed (Nausea/Vomiting). 60 tablet 3    QUEtiapine (SEROQUEL) 100 MG tablet Take 1 tablet (100 mg total) by mouth nightly as needed (sleep and anxiety). 90 tablet 3    venlafaxine (EFFEXOR-XR) 150 MG 24 hr capsule Take 1 capsule (150 mg total) by mouth daily. 90 capsule 3     No current facility-administered medications for this visit.       Laboratory Data:  No visits with results within 1 Day(s) from this visit.   Latest known visit with results is:   Lab on 03/22/2023   Component Date Value    Sodium 03/22/2023 134 (L)     Potassium 03/22/2023 4.1     Chloride 03/22/2023 103     CO2 03/22/2023 22.0     Anion Gap 03/22/2023 9     BUN 03/22/2023 27 (H)     Creatinine 03/22/2023 1.80 (H)     BUN/Creatinine Ratio 03/22/2023 15     eGFR CKD-EPI (2021) Male 03/22/2023 40 (L)     Glucose 03/22/2023 217 (H)     Calcium 03/22/2023 8.8     Albumin 03/22/2023 3.6     Total Protein 03/22/2023 6.9     Total Bilirubin 03/22/2023 1.0     AST 03/22/2023 51 (H)     ALT 03/22/2023 35     Alkaline Phosphatase 03/22/2023 222 (H)     WBC 03/22/2023 4.2     RBC 03/22/2023 4.27     HGB 03/22/2023 12.5 (L)     HCT 03/22/2023 35.5 (L)     MCV 03/22/2023 83.2     MCH 03/22/2023 29.2     MCHC 03/22/2023 35.1     RDW 03/22/2023 14.0     MPV 03/22/2023 7.2     Platelet 03/22/2023 130 (L)     Neutrophils % 03/22/2023 55.1     Lymphocytes % 03/22/2023 24.1     Monocytes % 03/22/2023 13.0     Eosinophils % 03/22/2023 6.7     Basophils % 03/22/2023 1.1     Absolute Neutrophils 03/22/2023 2.3     Absolute Lymphocytes 03/22/2023 1.0 (L)     Absolute Monocytes 03/22/2023 0.5     Absolute Eosinophils 03/22/2023 0.3     Absolute Basophils 03/22/2023 0.0         I spent 27 minutes with KeithDam in direct patient care.    Trilby Drummer, PharmD, BCOP, CPP  GI Oncology Clinical Pharmacist Practitioner

## 2023-03-29 MED ORDER — DIPHENOXYLATE-ATROPINE 2.5 MG-0.025 MG TABLET
ORAL_TABLET | Freq: Four times a day (QID) | ORAL | 0 refills | 90 days | Status: CP
Start: 2023-03-29 — End: 2023-06-27

## 2023-03-29 NOTE — Unmapped (Addendum)
Clinical Pharmacist Brief Note:    Mr. Curless states his diarrhea is about the same and he has not picked up any Lomotil. He states he has been drinking more water. He is still taking 2 capsules of loperamide 4-6 times/day and endorses loose stools about once every 2 hours on average. He takes loperamide before eating but not necessarily around the clock.     Instructed patient to pick up Lomotil from local pharmacy and start taking it (alternate with Imodium every 3-4 hours around the clock). Instructed patient to inform us if any issue. New prescription sent to local pharmacy. Plan to follow-up on how patient is doing with Lomotil at next clinic visit on 04/05/23.    Trilby Drummer, PharmD, BCOP, CPP  GI Oncology Clinical Pharmacist Practitioner

## 2023-04-05 DIAGNOSIS — C23 Malignant neoplasm of gallbladder: Principal | ICD-10-CM

## 2023-04-05 MED ORDER — MIDODRINE 5 MG TABLET
ORAL_TABLET | Freq: Three times a day (TID) | ORAL | 0 refills | 30 days
Start: 2023-04-05 — End: 2023-05-05

## 2023-04-05 MED ORDER — DIPHENOXYLATE-ATROPINE 2.5 MG-0.025 MG TABLET
ORAL_TABLET | Freq: Four times a day (QID) | ORAL | 0 refills | 90 days | Status: CP
Start: 2023-04-05 — End: 2023-07-04

## 2023-04-05 NOTE — Unmapped (Signed)
[facility]  contacted the Communication Center requesting to speak with the care team of DARRIL SULEMAN to discuss:    Judeth Cornfield stated she is calling to get clarification on   diphenoxylate-atropine (LOMOTIL) 2.5-0.025 mg per tablet      Please contact Stephanie  at [phone].    Thank you,   Warnell Bureau  [facility]   [phone]

## 2023-04-05 NOTE — Unmapped (Signed)
Returned call to pt. States he had a appt with Dr. Timoteo Gaul this am but cancelled as he did not feel well enough to come in.States he has been experiencing dizziness,nausea, lack of coordination, and fatigue on and off for awhile.States he gets nauseated sometimes after taking capecitabine but has not picked up the zofran yet from the pharmacy. Usually goes away after he eats.Denies any vomiting.States he is eating and drinking without any issues.    States he feels the dizziness when he gets up to walk and at times feels like he might fall or has a lack of coordination.    Explained that he should have tried to get someone to bring him this morning to be evaluated as he probably needs labs, vitals, and a assessment done.Let him know I will relay the message to Dr. Thomes Lolling team for their recommendations and let him know.He is agreeable to this plan and will call back with any further needs.

## 2023-04-05 NOTE — Unmapped (Signed)
Clinical Pharmacist Brief Note:    Prescription for Lomotil rerouted to [facility] pharmacy per patient request.    Trilby Drummer, PharmD, BCOP, CPP  GI Oncology Clinical Pharmacist Practitioner

## 2023-04-05 NOTE — Unmapped (Signed)
Returned call to pt. Let him know he will need to contact [facility] to arrange delivery of capecitabine.Will ask Dr. Timoteo Gaul to refill midodrine.

## 2023-04-05 NOTE — Unmapped (Signed)
Hi Keith Ballard,    This is not my pt this is GI  pt.     Alexus, please advise

## 2023-04-05 NOTE — Unmapped (Signed)
Clinical Pharmacist Brief Note:    Order placed for CPP appointment when patient comes in for appt with Dr. Timoteo Gaul, if possible.    Trilby Drummer, PharmD, BCOP, CPP  GI Oncology Clinical Pharmacist Practitioner

## 2023-04-05 NOTE — Unmapped (Signed)
Hi Dr. Timoteo Gaul,    Patient Keith Ballard contacted the Communication Center to cancel their appointment for today.  The appointment has been cancelled.    Cancellation Reason: Sick (cites dizziness, lightheadedness)    Thank you,  Kelli Hope  [facility]   [phone]

## 2023-04-05 NOTE — Unmapped (Signed)
Returned call to [place].She was just questioning the quantity of 720 tabs. Let  her know pt is currently on cancer treatment and has a J pouch.She will go ahead and fill the script but states insurance may not authorize 720 tabs.If so, she will change the script to a 30 day supply. Let her know I will relay this message to the CPP that ordered and they will be notified of any concerns with this plan.She agrees.

## 2023-04-05 NOTE — Unmapped (Signed)
Called and spoke to Mr. Looney to follow up on triage nurse phone calls. He reports that his capecitabine dose this morning was the last of his cycle and now will be off for 7 days. Tried to confirm with patient that this was his 2nd cycle. He was unsure of whether he'd had 2 cycles or 3 cycles. Advised it was documented that he started capecitabine on June 4, so I would expect he would be in his off week this week. He believes that he may have gotten off schedule, possibly had an extra week off. Mr. Sass reports that side effects have been about the same, not worse. He agrees that a calendar would be helpful. Reports that his wife helps him manage his medications.    Experiences upset stomach after capecitabine, which is not constant but frequent. He reports that he is not consistent with eating prior to capecitabine administration. We discussed have some toast or 1/2 bagel before hand, which he was agreeable to try.     Has J pouch, still having some looser than normal stools. He takes imodium as needed. Took 2 tablets this morning. Has not picked up Lomitil. Mr. Prouse asked that the prescription be sent to [facility] pharmacy as his local pharmacy is closed on Fridays. Advised Mr. Bartsch that I would follow up with team and have it sent there.     Patient is agreeable to come in next Thursday, 7/18 to see Dr. Timoteo Gaul with labs prior.     Advised patient optimize oral hydration and to practice safety, rising slowly and standing in place before walking in order to prevent falls. He verbalized understanding.    Returned call to patient. Advised that he has appointment 7/18 with Dr. Timoteo Gaul and reviewed times. Advised that lomitil was re-routed to [facility]. And team recommending he follow up with PCP about midodrine. I encouraged him to call nurse triage line [phone] if he has any concerns over the weekend. He verbalized understanding.

## 2023-04-05 NOTE — Unmapped (Signed)
Hi,     Keith Ballard has contacted the Communication Center in regards to the following symptom:     Dizziness, Lightheadedness and Extreme fatigue or weakness    Please contact Mr. Roughton at [phone]        Thank you,  Kelli Hope   [facility]   [phone]

## 2023-04-05 NOTE — Unmapped (Signed)
Hi,    Patient Keith Ballard called requesting a medication refill for the following:    Medication: capecitabine  Dosage: 500 mg  Days left of medication: 0  Pharmacy: [facility] #[id number]     Medication: midodrine  Dosage: 5 mg  Days left of medication: 0  [facility]: [facility] #[id number]         Thank you,  Kelli Hope  [place] [facility]  [phone]

## 2023-04-10 NOTE — Unmapped (Signed)
Clinical Pharmacist Practitioner: GI Oncology Clinic    Patient Name: Keith Ballard  Patient Age: 67 y.o.  Encounter Date: 04/11/2023  Primary Oncologist: Jeannine Boga, MD    Reason for visit: oral chemotherapy follow-up    ASSESSMENT & PLAN:  1. 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 Coordinated scheduling delivery from [facility] to patient and updated and provided treatment calendar accordingly.   - Decrease to capecitabine 1000 mg in AM and 1500 mg PO in PM on days 1-14 of each 21-day cycle. Capecitabine to be delivered on 7/19 and next cycle to start on 7/20.    2. Diarrhea: Grade 2 and improved per patient report but has not started Lomotil.   - May alternate 2 Lomotil with 2 Imodium every 2-4 hours around the clock (may do 2 hours during daytime, 4 hours during nighttime)   - Ensure adequate hydration    3. Dizziness: Main concern of patient. Will reduce dose. Was present prior to treatment but may consider imaging if needed if does not improve. Encouraged patient to follow up with PCP regarding midodrine dosing and encouraged patient to get up slowly and stay hydrated. Patient and Dr. Timoteo Gaul discussed fall risk and starting PT.      4. Nausea: Grade 1 but patient not using anything.  - Start ondansetron 8 mg PO every 8 hours prn N/V at first sign of nausea    5. HFS: Grade 1 and patient is using a hand lotion. Encouraged using a non-scented thick moisturizer or lotion BID on hands and feet for prevention.   - follow-up on prevention plan and consider steroid cream if worsened    6. Mucositis: Grade 1 with sores on lip and stings with toothpaste but otherwise not causing pain or affecting eating.   - Start baking soda salt water rinses (instructions included in discharge summary)  - Switch to children's toothpaste and avoid alcohol based mouthwash      F/U: labs and clinic appt on 05/02/23    ______________________________________________________________________    HPI: Keith Ballard is a 67 y.o. 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     Oral chemotherapy: capecitabine 1500 mg (500 mg x3) PO BID on days 1-14 of each 21-day cycle  Start date: ~02/26/23  Specialty pharmacy: [facility]    Interim History: Mr. Regen was seen in clinic today prior to starting next cycle of capecitabine. His labs have not returned. He has not received capecitabine delivery and upon further investigation, he had not answered the calls to schedule delivery. He reports his diarrhea is a bit improved with loperamide, which he states he is using ~4x/day and if it is worse he uses more. He states he has not started or picked up Lomotil. He reports having a J pouch and stool comes out of the rectum 6-8 times per day (baseline ~6 but difficult to determine) but states now the stools are more loose and increased in frequency and urgency. He states the most bothersome symptom is dizziness, which is often associated with getting up or he just feels weak. He tries to get up slowly and is trying to stay hydrated with mostly water and electrolyte drinks, but sometimes drinks soda. He is using midodrine and BP today in clinic is 115/62 mmHg. He reports having nausea sometimes but has not vomited. He states he has taken ondansetron once before and thought it helped but  is not otherwise or regularly taking anything for nausea. He reports some HFS on hands and states he is using a hand lotion that is not scented. He endorses some sores on his lip and is not doing anything for it, but states toothpaste can sting it but otherwise not causing pain or affecting eating much.     Updated treatment calendar:    July 2024    [facility] Tue Wed Thu Fri Sat   14    15    16    17    18    19    20    Capecitabine 2 tab (AM)    Capecitabine 3 tab (PM)   21   Capecitabine 2 tab (AM)    Capecitabine 3 tab (PM) 22 Capecitabine 2 tab (AM)    Capecitabine 3 tab (PM) 23   Capecitabine 2 tab (AM)    Capecitabine 3 tab (PM) 24   Capecitabine 2 tab (AM)    Capecitabine 3 tab (PM) 25   Capecitabine 2 tab (AM)    Capecitabine 3 tab (PM) 26   Capecitabine 2 tab (AM)    Capecitabine 3 tab (PM) 27   Capecitabine 2 tab (AM)    Capecitabine 3 tab (PM)   28   Capecitabine 2 tab (AM)    Capecitabine 3 tab (PM) 29   Capecitabine 2 tab (AM)    Capecitabine 3 tab (PM) 30   Capecitabine 2 tab (AM)    Capecitabine 3 tab (PM) 31   Capecitabine 2 tab (AM)    Capecitabine 3 tab (PM)     August  2024    Sun Mon Tue Wed Thu Fri Sat       1   Capecitabine 2 tab (AM)    Capecitabine 3 tab (PM) 2   Capecitabine 2 tab (AM)    Capecitabine 3 tab (PM) 3   NO CAPECITABINE   4   NO CAPECITABINE 5   NO CAPECITABINE 6   NO CAPECITABINE 7   NO CAPECITABINE 8   NO CAPECITABINE 9   NO CAPECITABINE 10          Adherence: will start next cycle on Saturday (will be delivered tomorrow)  Drug-Drug Interactions: Qtc prolongation with venlafaxine and ondansetron, risk of decreased efficacy of capecitabine with omeprazole - did not assess if pt is taking (Monitor efficacy while on concurrent pantoprazole (some lower quality data suggesting possible DDI, although the interaction is doubtful based on limited clinical documentation, contradictory PK results, and inconsistent in vitro results: J Oncol Pharm Pract. 2019 Oct;25(7):1705-1711).)     Oncology History:  Hematology/Oncology History   Non-Hodgkin lymphoma (CMS-HCC) (Resolved)   05/28/2020 Initial Diagnosis    Lymphoma involving liver (CMS-HCC)     06/01/2020 - 06/01/2020 Chemotherapy    IP LYMPHOMA INTRATHECAL TRIPLE  cytarabine 100 mg intrathecal     06/03/2020 - 06/10/2020 Chemotherapy    IP/OP LYMPHOMA R-DA-EPOCH (OP RITUXIMAB + PEGFILGRASTIM ON DAY 6) (Q24H)  riTUXimab 375 mg/m2 IV on day 6, etoposide 50 mg/m2 days 1 to 4, DOXOrubicin 10 mg/m2 days 1 to 4, vinCRIStine 0.4 mg/m2 days 1 to 4, cyclophosphamide 750 mg/m2 day 5, prednisone days 1 to 5     07/15/2020 - 10/06/2020 Chemotherapy    OP LYMPHOMA R-CHOP (EVERY 21 DAYS)  riTUXimab 375 mg/m2 (standard IV, rapid infusion IV, or riTUXimab Hycela 1,400 mg SQ) on day 1, vinCRIStine 1.4 mg/m2 (max 2 mg) IV on day 1, DOXOrubicin 50  mg/m2 IV on day 1, cyclophosphamide 750 mg/m2 IV on day 1, predniSONE 100 mg PO on days 1-5, every 21-day cycle     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)   06/12/2020 Initial Diagnosis    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)     07/15/2020 - 10/06/2020 Chemotherapy    OP LYMPHOMA R-CHOP (EVERY 21 DAYS)  riTUXimab 375 mg/m2 (standard IV, rapid infusion IV, or riTUXimab Hycela 1,400 mg SQ) on day 1, vinCRIStine 1.4 mg/m2 (max 2 mg) IV on day 1, DOXOrubicin 50 mg/m2 IV on day 1, cyclophosphamide 750 mg/m2 IV on day 1, predniSONE 100 mg PO on days 1-5, every 21-day cycle     Gallbladder cancer (CMS-HCC)   01/31/2023 Initial Diagnosis    Gallbladder cancer (CMS-HCC)     01/31/2023 -  Cancer Staged    Staging form: Gallbladder, AJCC 8th Edition  - Pathologic: Stage IIB (pT2b, pN0, cM0) - Signed by Jeannine Boga, MD on 01/31/2023           Medications:  Current Outpatient Medications   Medication Sig Dispense Refill    acetaminophen (TYLENOL) 500 MG tablet Take 2 tablets (1,000 mg total) by mouth every eight (8) hours. 90 tablet 0    diphenoxylate-atropine (LOMOTIL) 2.5-0.025 mg per tablet Take 2 tablets by mouth four (4) times a day. 720 tablet 0    enoxaparin (LOVENOX) 40 mg/0.4 mL Syrg Inject 0.4 mL (40 mg total) under the skin daily. (Patient not taking: Reported on 02/28/2023) 11.2 mL 0    hydrOXYzine (ATARAX) 25 MG tablet Take one or two tablets at bedtime as needed for anxiety or sleep 60 tablet 11    insulin glargine (BASAGLAR, LANTUS) 100 unit/mL (3 mL) injection pen Inject 0.15 mL (15 Units total) under the skin nightly. 15 mL 0    JARDIANCE 10 mg tablet TAKE ONE TABLET BY MOUTH DAILY AT 9AM FOR KIDNEY PROTECTION      lidocaine 4 % patch Place 1 patch on the skin daily as needed (pain). Apply for 12 hours then remove for 12 hours. (Patient not taking: Reported on 02/28/2023) 15 patch 0    loperamide (IMODIUM A-D) 2 mg tablet Take 2 tablets (4 mg) by mouth for first loose stool followed by 1 tablet (2 mg) after each loose stool thereafter (maximum of 8 tablets per day). 30 tablet 11    MELATONIN, BULK, MISC by Miscellaneous route. (Patient not taking: Reported on 02/28/2023)      omeprazole (PRILOSEC) 40 MG capsule       ondansetron (ZOFRAN) 8 MG tablet Take 1 tablet (8 mg total) by mouth every eight (8) hours as needed for nausea. 60 tablet 3    pen needle, diabetic 32 gauge x 5/32 (4 mm) Ndle Use with insulin up to 4 times/day as needed. 100 each 0    prochlorperazine (COMPAZINE) 10 MG tablet Take 1 tablet (10 mg total) by mouth every six (6) hours as needed (Nausea/Vomiting). 60 tablet 3    QUEtiapine (SEROQUEL) 100 MG tablet Take 1 tablet (100 mg total) by mouth nightly as needed (sleep and anxiety). 90 tablet 3    venlafaxine (EFFEXOR-XR) 150 MG 24 hr capsule Take 1 capsule (150 mg total) by mouth daily. 90 capsule 3     No current facility-administered medications for this visit.       Laboratory Data:  No visits with results within 1 Day(s) from this visit.  Latest known visit with results is:   Lab on 03/22/2023   Component Date Value    Sodium 03/22/2023 134 (L)     Potassium 03/22/2023 4.1     Chloride 03/22/2023 103     CO2 03/22/2023 22.0     Anion Gap 03/22/2023 9     BUN 03/22/2023 27 (H)     Creatinine 03/22/2023 1.80 (H)     BUN/Creatinine Ratio 03/22/2023 15     eGFR CKD-EPI (2021) Male 03/22/2023 40 (L)     Glucose 03/22/2023 217 (H)     Calcium 03/22/2023 8.8     Albumin 03/22/2023 3.6     Total Protein 03/22/2023 6.9     Total Bilirubin 03/22/2023 1.0     AST 03/22/2023 51 (H)     ALT 03/22/2023 35     Alkaline Phosphatase 03/22/2023 222 (H)     WBC 03/22/2023 4.2     RBC 03/22/2023 4.27     HGB 03/22/2023 12.5 (L)     HCT 03/22/2023 35.5 (L)     MCV 03/22/2023 83.2     MCH 03/22/2023 29.2     MCHC 03/22/2023 35.1     RDW 03/22/2023 14.0     MPV 03/22/2023 7.2     Platelet 03/22/2023 130 (L)     Neutrophils % 03/22/2023 55.1     Lymphocytes % 03/22/2023 24.1     Monocytes % 03/22/2023 13.0     Eosinophils % 03/22/2023 6.7     Basophils % 03/22/2023 1.1     Absolute Neutrophils 03/22/2023 2.3     Absolute Lymphocytes 03/22/2023 1.0 (L)     Absolute Monocytes 03/22/2023 0.5     Absolute Eosinophils 03/22/2023 0.3     Absolute Basophils 03/22/2023 0.0         I spent 25 minutes with Mr.Tolles in direct patient care.    Trilby Drummer, PharmD, BCOP, CPP  GI Oncology Clinical Pharmacist Practitioner

## 2023-04-11 ENCOUNTER — Ambulatory Visit: Payer: MEDICARE

## 2023-04-11 ENCOUNTER — Other Ambulatory Visit: Admit: 2023-04-11 | Discharge: 2023-04-12 | Payer: MEDICARE

## 2023-04-11 LAB — COMPREHENSIVE METABOLIC PANEL
ALBUMIN: 3.6 g/dL (ref 3.4–5.0)
ALKALINE PHOSPHATASE: 154 U/L — ABNORMAL HIGH (ref 46–116)
ALT (SGPT): 15 U/L (ref 10–49)
ANION GAP: 7 mmol/L (ref 5–14)
AST (SGOT): 23 U/L (ref <=34)
BILIRUBIN TOTAL: 1.1 mg/dL (ref 0.3–1.2)
BLOOD UREA NITROGEN: 21 mg/dL (ref 9–23)
BUN / CREAT RATIO: 14
CALCIUM: 9.2 mg/dL (ref 8.7–10.4)
CHLORIDE: 105 mmol/L (ref 98–107)
CO2: 23 mmol/L (ref 20.0–31.0)
CREATININE: 1.5 mg/dL — ABNORMAL HIGH
EGFR CKD-EPI (2021) MALE: 50 mL/min/{1.73_m2} — ABNORMAL LOW (ref >=60)
GLUCOSE RANDOM: 165 mg/dL (ref 70–179)
POTASSIUM: 3.9 mmol/L (ref 3.4–4.8)
PROTEIN TOTAL: 6.5 g/dL (ref 5.7–8.2)
SODIUM: 135 mmol/L (ref 135–145)

## 2023-04-11 LAB — CBC W/ AUTO DIFF
BASOPHILS ABSOLUTE COUNT: 0.1 10*9/L (ref 0.0–0.1)
BASOPHILS RELATIVE PERCENT: 1.2 %
EOSINOPHILS ABSOLUTE COUNT: 0.4 10*9/L (ref 0.0–0.5)
EOSINOPHILS RELATIVE PERCENT: 8.3 %
HEMATOCRIT: 34.2 % — ABNORMAL LOW (ref 39.0–48.0)
HEMOGLOBIN: 12 g/dL — ABNORMAL LOW (ref 12.9–16.5)
LYMPHOCYTES ABSOLUTE COUNT: 1.1 10*9/L (ref 1.1–3.6)
LYMPHOCYTES RELATIVE PERCENT: 21.1 %
MEAN CORPUSCULAR HEMOGLOBIN CONC: 34.9 g/dL (ref 32.0–36.0)
MEAN CORPUSCULAR HEMOGLOBIN: 30.2 pg (ref 25.9–32.4)
MEAN CORPUSCULAR VOLUME: 86.4 fL (ref 77.6–95.7)
MEAN PLATELET VOLUME: 7.2 fL (ref 6.8–10.7)
MONOCYTES ABSOLUTE COUNT: 0.6 10*9/L (ref 0.3–0.8)
MONOCYTES RELATIVE PERCENT: 11.4 %
NEUTROPHILS ABSOLUTE COUNT: 3 10*9/L (ref 1.8–7.8)
NEUTROPHILS RELATIVE PERCENT: 58 %
PLATELET COUNT: 141 10*9/L — ABNORMAL LOW (ref 150–450)
RED BLOOD CELL COUNT: 3.96 10*12/L — ABNORMAL LOW (ref 4.26–5.60)
RED CELL DISTRIBUTION WIDTH: 13.9 % (ref 12.2–15.2)
WBC ADJUSTED: 5.2 10*9/L (ref 3.6–11.2)

## 2023-04-11 MED ADMIN — heparin, porcine (PF) 100 unit/mL injection 500 Units: 500 [IU] | INTRAVENOUS | @ 20:00:00 | Stop: 2023-04-11 | NDC 00009029101

## 2023-04-11 NOTE — Unmapped (Signed)
The [facility] has made a third and final attempt to reach this patient to refill the following medication:Capecitabine.      We have left voicemails on the following phone numbers: [phone], have sent a text message to the following phone numbers: [phone], and have sent a Mychart questionnaire..    Dates contacted: 7/10, 7/12, and 7/18  Last scheduled delivery: 03/18/23 (21 day supply)    The patient may be at risk of non-compliance with this medication. The patient should call the [facility] at [phone]  Option 4, then Option 2: Dermatology, Gastroenterology, Rheumatology to refill medication.    Willette Pa   [facility] Specialty Technician

## 2023-04-11 NOTE — Unmapped (Addendum)
[facility] Refill Coordination Note    Specialty Medication(s) to be Shipped:   Hematology/Oncology: Capecitabine 500 mg, directions: Take 2 tablets (1,000 mg in the morning and 3 tabletssss (1,500 mg in the evening for 14 days. Only take on days 1 through 14 of each 21-day cycle. Take with full glass of water; within 30 minutes after a meal. Swallow tablets whole; do not cut or crush.    Other medication(s) to be shipped: No additional medications requested for fill at this time     Keith Ballard, DOB: 1956/01/10  Phone: [phone] (home) [phone] (work)      All above HIPAA information was verified with patient.     Was a Nurse, learning disability used for this call? No    Completed refill call assessment today to schedule patient's medication shipment from the [facility] [phone]).  All relevant notes have been reviewed.     Specialty medication(s) and dose(s) confirmed:  Take 2 tablets (1,000 mg in the morning and 3 tabletssss (1,500 mg in the evening for 14 days. Only take on days 1 through 14 of each 21-day cycle. Take with full glass of water; within 30 minutes after a meal. Swallow tablets whole; do not cut or crush    Changes to medications: [place] reports no changes at this time.  Changes to insurance: No  New side effects reported not previously addressed with a pharmacist or physician: None reported  Questions for the pharmacist: No    Confirmed patient received a Conservation officer, historic buildings and a Surveyor, mining with first shipment. The patient will receive a drug information handout for each medication shipped and additional FDA Medication Guides as required.       DISEASE/MEDICATION-SPECIFIC INFORMATION        N/A    SPECIALTY MEDICATION ADHERENCE          Were doses missed due to medication being on hold? No    capecitabine 500 mg: 0 days of medicine on hand     REFERRAL TO PHARMACIST     Referral to the pharmacist: Not needed      [facility]     Shipping address confirmed in Epic.       Delivery Scheduled: Yes, Expected medication delivery date: 04/12/23.     Medication will be delivered via Same Day Courier to the prescription address in Epic WAM.    Kermit Balo, [facility]   [facility] Specialty Pharmacist

## 2023-04-11 NOTE — Unmapped (Signed)
Drew labs from port. Heparin locked. Patient tolerated well. Bandaid applied. C.Chanese Hartsough,RN

## 2023-04-11 NOTE — Unmapped (Signed)
You should rinse your mouth (swish and spit) after meals and at bedtime with a mixture of: ?? teaspoon of salt and ?? teaspoon of baking soda in 8 ounces of water. You can prepare a daily supply of salt/baking soda rinse with 1 quart (4 cups) water, 1 teaspoon salt, 1 teaspoon baking soda (prepare new batch each day and dispose of old one). Additionally, avoid mouthwashes and toothpaste that contain alcohol and use a soft bristle toothbrush to clean your teeth.

## 2023-04-11 NOTE — Unmapped (Signed)
[facility] RETURN VISIT        PATIENT IDENTIFICATION   Keith Ballard is a 67 y.o. 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      CANCER STAGING     Cancer Staging   Gallbladder cancer (CMS-HCC)  Staging form: Gallbladder, AJCC 8th Edition  - Pathologic: Stage IIB (pT2b, pN0, cM0) - Signed by Jeannine Boga, MD on 01/31/2023      ONCOLOGIC HISTORY     Gallbladder adenocarcinoma, pT2bN0  07/26/22, OSH CT for lymphoma treatment response assessment incidentally revealed multiple areas of soft tissue nodularity along the gallbladder wall the largest measuring up to 1.2 cm near the fundus.  08/30/22, [place] RUQ gallbladder showed indeterminant polypoid lesion with internal vascularity arising from the wall of the gallbladder.   10/15/22, MRI abdomen enhancing intraluminal gallbladder lesions measuring 1.8 and 0.6 cm. Detailed anatomy is limited due to motion artifact. However grossly, there is no appreciable wall thickening or evidence of invasion. Gallbladder polyps/ primary gallbladder neoplasm is favored over lymphoma.  10/26/22, CA 19-9=15.3  11/13/22, cholecystectomy and liver biopsy; Pathology showed a 1.5 cm moderately to poorly differentiated adenocarcinoma with micropapillary features invading through the muscularis propria into the peribiliary connective tissue, cystic duct and liver bed margins are negative, +LVI, +PNI. Segment 5 biopsy (prior lymphoma site) negative for carcinoma. Segment 4 biopsy showed features of periductal concentric fibrosis and focal copper accumulation and minimal macrovascular steatosis, negative for carcinoma.  12/26/22, portal lymphadenectomy and segment 4B/5 partial hepatectomy; Final path negative for carcinoma.  01/31/23, initial consultation at [facility]  02/08/23, DPYD phenotype normal.  02/26/23, initiated on adjuvant capecitabine; CT CAP showed no evidence of recurrence/metastasis  03/22/23, C2 adjuvant capecitabine at 1500 mg twice daily on days 1-14   04/11/23, C3 adjuvant capecitabine (delayed due to weakness and diarrhea). 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      MOLECULAR DIAGNOSTICS     N/A    ASSESSMENT AND PLAN      # Gallbladder adenocarcinoma, pT2bN0  Incidentally identified on cholecystectomy specimen now s/p portal lymphadenectomy and segment 4B/5 partial hepatectomy on 12/26/22.     Mr. Galayda is here for follow up. He states the side effects from chemo was a little worse after C2. He is feeling better now.     PLAN  - Capecitabine dose reduced to 1000 mg qam and 1500 mg qpm on days 1-14 and off for 7 days  - RTC on 8/8 for labs and follow up with Melanie    # Dizziness/Lightheadedness: likely due to orthostatic hypotension. Chronic, onset prior to starting capecitabine.   - Advised patient to stay hydrated  - Fall precautions  - Consider brain imaging if symptoms worsens.     # Chemotherapy induced nausea: well controlled  - Anti-emetic PRN    # Diarrhea:   - anti diarrheal PRN        No orders of the defined types were placed in this encounter.      The natural history of cancer was reviewed and discussed with the patient and family members. Treatment options as well as risks and benefits were discussed with the patient/family. Patient and family member's  questions answered to their satisfaction    I spent a total of 40 minutes in both face-to-face and non-face-to-face activities for this visit on the date of this encounter.        INTERVAL HISTORY   Keith Ballard is here for follow up. He states the side effects from chemo was a little worse after C2 but he is feeling better now. Has not had any falls recently. Still feels dizzy and lightheaded mostly when he stands up. He waked up at night the other day and walked to the bathroom and found himself walking a serpentine line. This resolved after he took time and got himself steady. No other complains.      REVIEW OF SYSTEMS   The remainder of a comprehensive 10 systems review is negative.     Allergies and Medications reviewed as per chart    PHYSICAL EXAMS     There were no vitals filed for this visit.            04/11/23 77.8 kg (171 lb 8 oz)   03/22/23 75.7 kg (166 lb 12.8 oz)   02/28/23 77 kg (169 lb 11.2 oz)      ECOG: (1) Restricted in physically strenuous activity, ambulatory and able to do work of light nature    GENERAL: well developed, well nourished, in no distress  PSYCH: full and appropriate range of affect with good insight and judgement  HEENT: NCAT, pupils equal, sclerae anicteric   EXT: warm and well perfused, no edema  SKIN: No rashes       OBJECTIVE DATA   LABS:   Results for orders placed or performed in visit on 03/22/23   Comprehensive Metabolic Panel   Result Value Ref Range    Sodium 134 (L) 135 - 145 mmol/L    Potassium 4.1 3.4 - 4.8 mmol/L    Chloride 103 98 - 107 mmol/L    CO2 22.0 20.0 - 31.0 mmol/L    Anion Gap 9 5 - 14 mmol/L    BUN 27 (H) 9 - 23 mg/dL    Creatinine 0.86 (H) 0.73 - 1.18 mg/dL    BUN/Creatinine Ratio 15     eGFR CKD-EPI (2021) Male 40 (L) >=60 mL/min/1.36m2    Glucose 217 (H) 70 - 179 mg/dL    Calcium 8.8 8.7 - 57.8 mg/dL    Albumin 3.6 3.4 - 5.0 g/dL    Total Protein 6.9 5.7 - 8.2 g/dL    Total Bilirubin 1.0 0.3 - 1.2 mg/dL    AST 51 (H) <=46 U/L    ALT 35 10 - 49 U/L    Alkaline Phosphatase 222 (H) 46 - 116 U/L   CBC w/ Differential   Result Value Ref Range    WBC 4.2 3.6 - 11.2 10*9/L    RBC 4.27 4.26 - 5.60 10*12/L    HGB 12.5 (L) 12.9 - 16.5 g/dL    HCT 96.2 (L) 95.2 - 48.0 %    MCV 83.2 77.6 - 95.7 fL    MCH 29.2 25.9 - 32.4 pg    MCHC 35.1 32.0 - 36.0 g/dL    RDW 84.1 32.4 - 40.1 %    MPV 7.2 6.8 - 10.7 fL    Platelet 130 (L) 150 - 450 10*9/L    Neutrophils % 55.1 %    Lymphocytes % 24.1 %    Monocytes % 13.0 %    Eosinophils % 6.7 %    Basophils % 1.1 %  Absolute Neutrophils 2.3 1.8 - 7.8 10*9/L    Absolute Lymphocytes 1.0 (L) 1.1 - 3.6 10*9/L    Absolute Monocytes 0.5 0.3 - 0.8 10*9/L    Absolute Eosinophils 0.3 0.0 - 0.5 10*9/L    Absolute Basophils 0.0 0.0 - 0.1 10*9/L     *Note: Due to a large number of results and/or encounters for the requested time period, some results have not been displayed. A complete set of results can be found in Results Review.     RADIOLOGY RESULTS   CT Abdomen Pelvis W Contrast  Narrative: EXAM: CT ABDOMEN PELVIS W CONTRAST  ACCESSION: [id number] UN  CLINICAL INDICATION: 67 years old with cancer staging  - C23 - Gallbladder cancer (CMS - HCC)      COMPARISON: 12/01/2022    TECHNIQUE: A helical CT scan of the abdomen and pelvis was obtained following IV contrast from the lung bases through the pubic symphysis. Images were reconstructed in the axial plane. Coronal and sagittal reformatted images were also provided for further evaluation.     FINDINGS:    LOWER CHEST: Chest CT was performed concurrently, will be dictated separately. Please see that report for additional discussion.    LIVER: Liver is normal in size and contour. Stable, linearly oriented, 2 cm hypodensity in segment VIII (2:21) and a rounded lesion along the gallbladder fossa in segment V measuring 1.7 cm (2:43). No new liver lesions are identified.    BILIARY: Gallbladder is surgically absent. There is low-attenuation along the gallbladder fossa, unchanged. No intrahepatic biliary ductal dilation. Common bile duct is normal in caliber.    SPLEEN: Mild heterogeneous enhancement of the spleen without discrete lesion. Spleen is normal in size.    PANCREAS: Atrophic pancreas. No focal lesions.  No ductal dilation.    ADRENAL GLANDS: No suspicious adrenal nodules.    KIDNEYS/URETERS: Kidneys enhance symmetrically. There is no hydronephrosis.    BLADDER: Circumferential bladder wall thickening, may be exaggerated by under distention.    REPRODUCTIVE ORGANS: Prostate gland is normal in size. Small fat-containing left inguinal hernia.    GI TRACT: Postoperative changes of total abdominal colectomy. Stomach is distended with fluid and debris    PERITONEUM, RETROPERITONEUM AND MESENTERY: No pneumoperitoneum or drainable collection. Evolving changes of recent surgery in the ventral abdominal wall and upper peritoneum anterior to the stomach. No significant ascites.    LYMPH NODES: There are a few prominent but subcentimeter short axis celiac axis and retroperitoneal nodes which could be reactive in the setting of recent surgery.    VESSELS: Hepatic and portal veins are patent.  Normal caliber aorta.      BONES and SOFT TISSUES: No aggressive osseous lesions. Evolving operative changes of the ventral abdominal wall.  Impression: --No new sites of metastatic disease in the abdomen or pelvis. Stable hypodense liver lesions.  --Evolving changes of recent surgery in the ventral abdominal wall and peritoneum. Attention on follow-up to ensure continued resolution over time.  --Prominent celiac axis and retroperitoneal lymph nodes, nonspecific but may be reactive in the setting of recent surgery.  --Circumferential bladder wall thickening without significant surrounding inflammation. Correlate for cystitis.  CT Chest Wo Contrast  Narrative: EXAM: CT CHEST WO CONTRAST  DATE: 02/28/2023 9:44 AM  ACCESSION: [id number] UN  DICTATED: 02/28/2023 9:45 AM  INTERPRETATION LOCATION: MAIN CAMPUS    CLINICAL INDICATION: 67 year old male with staging  - C23 - Gallbladder cancer (CMS - HCC).    COMPARISON: March 8.    TECHNIQUE: Contiguous 0.6  or 1 mm and 2 mm axial images were reconstructed through the chest following a single breath hold helical acquisition.  Images were reformatted in the axial and sagittal planes.  MIP slabs were also constructed.    FINDINGS:    LUNGS AND AIRWAYS: The lungs are clear.  The central airways are patent.     PLEURA: No pleural fluid or pneumothorax.    MEDIASTINUM AND LYMPH NODES: No enlarged intrathoracic or axillary lymph nodes are present.  No other mediastinal abnormality.    HEART AND VASCULATURE:The cardiac chambers are normal in size.  There is no pericardial effusion.  Ascending and descending aorta normal in caliber.  Pulmonary artery normal in size. Coronary calcification. Catheter right atrium.    BONES AND SOFT TISSUES: Unremarkable.    UPPER ABDOMEN: Reported separately.    OTHER: No other significant findings.  Impression: No intrathoracic metastasis.         CARE TEAM     [facility]: Dr. Renae Fickle     [facility]cology: Dr. Tora Duck     FOLLOW UP     Future Appointments   Date Time Provider Department Center   04/18/2023 10:00 AM Herfarth, Philippa Chester, MD [facility]   06/13/2023  9:00 AM [name], MD [facility]   07/12/2023  2:00 PM [facility] CT RM 4 [facility] [place]   07/19/2023 12:30 PM Marion Downer, MD [facility]

## 2023-04-12 NOTE — Unmapped (Addendum)
Called and left voicemail for patient regarding appointments with NP Stanford Breed on August 8th at 8:30am. Patient will have labs drawn in clinic due to no availability in the labs.

## 2023-04-13 MED ORDER — CAPECITABINE 500 MG TABLET
ORAL_TABLET | 3 refills | 0 days | Status: CP
Start: 2023-04-13 — End: ?
  Filled 2023-04-12: qty 70, 28d supply, fill #0
  Filled 2023-05-09: qty 70, 28d supply, fill #1

## 2023-04-18 ENCOUNTER — Ambulatory Visit: Admit: 2023-04-18 | Payer: MEDICARE | Attending: Gastroenterology | Primary: Gastroenterology

## 2023-04-18 NOTE — Unmapped (Unsigned)
[facility] Follow-up VISIT      REFERRING PROVIDER:  Gae Bon, MD  [address]  [id number]  [place],  [place] [address]    PRIMARY CARE PROVIDER:  Gae Bon, MD        HISTORY OF PRESENT ILLNESS: This is a 67 y.o. year old male with history of ulcerative colitis initially diagnosed in 1988 and complicated by high grade dysplasia for which he underwent total abdominal colectomy and IPAA in 1996/1997 in two stages.    Patient was lost to follow-up for the last 3 years.    Currently the patient is recovering from his second open liver surgery beginning of April after initial surgery for gallbladder cancer in February 2024.  He is getting stronger, not sure if cefdinir helped. Loperamide 2 tablet bid are somewhat helping .  Still increased  bowel frequency approx. 1/hour.  Stabilized  body weight.      Bowel frequency /24 hours  10-12  Bowel frequency during night 1-2  Blood in bowel movements none  Abdominal pain  None = 0  Bloating rarely  Incontinence 1-2 month  Bodyweight decreasing  Loperamide No  Lomotil No  Hyoscyamine:  No  Antibiotic none       PROBLEMS:  Patient Active Problem List   Diagnosis    Obstructive sleep apnea    Posterior subcapsular polar senile cataract    Intestinal bypass or anastomosis status    Senile nuclear sclerosis    UC (ulcerative colitis) (CMS-HCC)    Vocal process granuloma    Vocal fold scar    Periodic limb movement disorder (PLMD)    Pouchitis (CMS-HCC)    Panic attack    Onychomycosis    Type 2 diabetes mellitus, with long-term current use of insulin (CMS-HCC)    Vasculogenic erectile dysfunction    TBI (traumatic brain injury) (CMS-HCC)    Right knee pain    PTSD (post-traumatic stress disorder)    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     PAST MEDICAL HISTORY:    Past Medical History:   Diagnosis Date    Acute kidney failure (CMS-HCC) 05/24/2020    Anxiety     Arthritis     Cataract     Colon cancer (CMS-HCC)     s/p colectomy    Depression     Essential hypertension 09/04/2016    GERD (gastroesophageal reflux disease)     Left hip pain 09/12/2015    Peptic ulceration     PTSD (post-traumatic stress disorder)     Risk for falls 01/15/2023    Sepsis (CMS-HCC) 06/12/2020    Sleep apnea     Sleep apnea 2015    Strep throat     Type 2 diabetes mellitus without complication (CMS-HCC) 08/02/2015    UC (ulcerative colitis) (CMS-HCC)        PAST SURGICAL HISTORY:    Past Surgical History:   Procedure Laterality Date    CARPAL  TUNNEL RELEASE      CERVICAL SPINE SURGERY      COLECTOMY      COLECTOMY  1997    FRACTURE SURGERY      IR INSERT G-TUBE PERCUTANEOUS  07/01/2020    IR INSERT G-TUBE PERCUTANEOUS 07/01/2020 Maree Erie, MD IMG VIR [facility]    IR INSERT PORT AGE GREATER THAN 5 YRS  06/03/2020    IR INSERT PORT AGE GREATER THAN 5 YRS 06/03/2020 Andres Labrum, MD IMG VIR [facility]    KNEE CARTILAGE SURGERY      NECK SURGERY      PR LAP,CHOLECYSTECTOMY N/A 11/13/2022    Procedure: LAPAROSCOPY, SURGICAL; CHOLECYSTECTOMY;  Surgeon: Marion Downer, MD;  Location: MAIN OR [facility];  Service: Surgical Oncology    PR LARYNGOSCOPY,DIRECT,DX,OP MICROSCOP N/A 09/01/2021    Procedure: LARYNGOSCOPY DIRECT WITH OR WITHOUT TRACHEOSCPY; DIAGNOSTIC, WITH OPERATING MICROSCOPE OR TELESCOPE;  Surgeon: Hardie Pulley, MD;  Location: ASC OR [facility];  Service: ENT    PR LARYNGOSCOPY,DIRECT,SCOPE,INJ CORDS N/A 09/01/2021    Procedure: LARYNGOSCOPY, DIRECT, WITH INJECTION INTO VOCAL CORD(S), THERAPEUTIC; W/OPERATING MICROSCOPE OR TELESCOPE;  Surgeon: Hardie Pulley, MD;  Location: ASC OR [facility];  Service: ENT    PR LYSIS ADNEXAL ADHESIONS N/A 12/26/2022    Procedure: LYSIS OF ADHESIONS (SALPINGOLYSIS, OVARIOLYSIS);  Surgeon: Marion Downer, MD;  Location: MAIN OR [facility];  Service: Surgical Oncology    PR NDSC EVAL INTSTINAL POUCH DX W/COLLJ SPEC SPX N/A 05/13/2020    Procedure: ENDO EVAL SM INTEST POUCH; DX;  Surgeon: Modena Nunnery, MD;  [facility];  Service: Gastroenterology    PR NDSC EVAL INTSTINAL POUCH W/BX SINGLE/MULTIPLE N/A 08/04/2014    Procedure: ENDOSCOPIC EVAL OF SMALL INTESTINAL POUCH; DIAGNOSTIC, WITH BIOPSY;  Surgeon: Trula Slade, MD;  [facility];  Service: Gastroenterology    PR NDSC EVAL INTSTINAL POUCH W/BX SINGLE/MULTIPLE N/A 06/06/2016    Procedure: ENDOSCOPIC EVAL OF SMALL INTESTINAL POUCH; DIAGNOSTIC, WITH BIOPSY;  Surgeon: Modena Nunnery, MD;  Location: [facility]URES [facility];  Service: Gastroenterology    PR REMOVE ABD LYMPH NODES RAD REGNL N/A 12/26/2022    Procedure: ABDOMINAL LYMPHADENECT, REGION, INCLUD CELIAC/GASTRIC/PORTAL/PERIPANCREAT, W/WO PARA-AORT/VENA CAVAL NODES;  Surgeon: Marion Downer, MD;  Location: MAIN OR [facility];  Service: Surgical Oncology    PR RESEC LIVER,PART LOBECTOMY N/A 12/26/2022    Procedure: HEPATECTOMY, RESECTION OF LIVER; PARTIAL LOBECTOMY;  Surgeon: Marion Downer, MD;  Location: MAIN OR [facility];  Service: Surgical Oncology    PR SIGMOIDOSCOPY,BIOPSY  10/22/2013    Procedure: SIGMOIDOSCOPY, FLEXIBLE; WITH BIOPSY, SINGLE OR MULTIPLE;  Surgeon: Letta Pate, MD;  [facility];  Service: Gastroenterology    PR UPPER GI ENDOSCOPY,DIAGNOSIS Left 05/13/2020    Procedure: UGI ENDO, INCLUDE ESOPHAGUS, STOMACH, & DUODENUM &/OR JEJUNUM; DX W/WO COLLECTION SPECIMN, BY BRUSH OR WASH;  Surgeon: Modena Nunnery, MD;  [facility];  Service: Gastroenterology    PR WEDGE BIOPSY OF LIVER N/A 11/13/2022    Procedure: BIOPSY OF LIVER, WEDGE; Surgeon: Marion Downer, MD;  Location: MAIN OR [facility];  Service: Surgical Oncology    reatna      RETINAL DETACHMENT SURGERY      SINUS SURGERY         MEDICATIONS:      Current Outpatient Medications:     acetaminophen (TYLENOL) 500 MG tablet, Take 2 tablets (1,000 mg total) by mouth every eight (8) hours.,  Disp: 90 tablet, Rfl: 0    capecitabine (XELODA) 500 MG tablet, Take 2 tablets (1000 mg) by mouth in the morning and 3 tablets (1500 mg) in the evening for 14 days. Only take on days 1 through 14 of each 21-day cycle. Take with full glass of water, within 30 minutes after a meal. Swallow tablets whole; do not cut or crush., Disp: 70 tablet, Rfl: 3    diphenoxylate-atropine (LOMOTIL) 2.5-0.025 mg per tablet, Take 2 tablets by mouth four (4) times a day., Disp: 720 tablet, Rfl: 0    enoxaparin (LOVENOX) 40 mg/0.4 mL Syrg, Inject 0.4 mL (40 mg total) under the skin daily. (Patient not taking: Reported on 02/28/2023), Disp: 11.2 mL, Rfl: 0    hydrOXYzine (ATARAX) 25 MG tablet, Take one or two tablets at bedtime as needed for anxiety or sleep, Disp: 60 tablet, Rfl: 11    insulin glargine (BASAGLAR, LANTUS) 100 unit/mL (3 mL) injection pen, Inject 0.15 mL (15 Units total) under the skin nightly., Disp: 15 mL, Rfl: 0    JARDIANCE 10 mg tablet, TAKE ONE TABLET BY MOUTH DAILY AT 9AM FOR KIDNEY PROTECTION, Disp: , Rfl:     lidocaine 4 % patch, Place 1 patch on the skin daily as needed (pain). Apply for 12 hours then remove for 12 hours. (Patient not taking: Reported on 02/28/2023), Disp: 15 patch, Rfl: 0    loperamide (IMODIUM A-D) 2 mg tablet, Take 2 tablets (4 mg) by mouth for first loose stool followed by 1 tablet (2 mg) after each loose stool thereafter (maximum of 8 tablets per day)., Disp: 30 tablet, Rfl: 11    MELATONIN, BULK, MISC, by Miscellaneous route. (Patient not taking: Reported on 02/28/2023), Disp: , Rfl:     omeprazole (PRILOSEC) 40 MG capsule, , Disp: , Rfl:     ondansetron (ZOFRAN) 8 MG tablet, Take 1 tablet (8 mg total) by mouth every eight (8) hours as needed for nausea., Disp: 60 tablet, Rfl: 3    pen needle, diabetic 32 gauge x 5/32 (4 mm) Ndle, Use with insulin up to 4 times/day as needed., Disp: 100 each, Rfl: 0    prochlorperazine (COMPAZINE) 10 MG tablet, Take 1 tablet (10 mg total) by mouth every six (6) hours as needed (Nausea/Vomiting)., Disp: 60 tablet, Rfl: 3    QUEtiapine (SEROQUEL) 100 MG tablet, Take 1 tablet (100 mg total) by mouth nightly as needed (sleep and anxiety)., Disp: 90 tablet, Rfl: 3    venlafaxine (EFFEXOR-XR) 150 MG 24 hr capsule, Take 1 capsule (150 mg total) by mouth daily., Disp: 90 capsule, Rfl: 3      ALLERGIES:    Sulfa (sulfonamide antibiotics)    SOCIAL HISTORY:    Social History     Socioeconomic History    Marital status: Married   Tobacco Use    Smoking status: Never    Smokeless tobacco: Never   Vaping Use    Vaping status: Never Used   Substance and Sexual Activity    Alcohol use: No     Alcohol/week: 0.0 standard drinks of alcohol    Drug use: No   Social History Narrative    The patient is a Clinical research associate.      He has lived in [place], [place], [place], [place] and [place]     Social Determinants of Health     Financial Resource Strain: Low Risk  (07/29/2020)    Overall Financial Resource Strain (CARDIA)     Difficulty of Paying  Living Expenses: Not hard at all   Food Insecurity: No Food Insecurity (07/27/2022)    Received from [facility], [facility]    Hunger Vital Sign     Worried About Running Out of Food in the Last Year: Never true     Ran Out of Food in the Last Year: Never true   Transportation Needs: No Transportation Needs (07/27/2022)    Received from [facility], [facility]    [facility] - Transportation     Lack of Transportation (Medical): No     Lack of Transportation (Non-Medical): No   Physical Activity: Sufficiently Active (06/16/2021)    Exercise Vital Sign     Days of Exercise per Week: 4 days     Minutes of Exercise per Session: 50 min Stress: No Stress Concern Present (06/16/2021)    [employer] of Occupational Health - Occupational Stress Questionnaire     Feeling of Stress : Only a little   Social Connections: Moderately Integrated (06/16/2021)    Social Connection and Isolation Panel [NHANES]     Frequency of Communication with Friends and Family: More than three times a week     Frequency of Social Gatherings with Friends and Family: Never     Attends Religious Services: More than 4 times per year     Active Member of [employer] or Organizations: No     Attends Engineer, structural: Never     Marital Status: Married       FAMILY HISTORY:    family history includes Arthritis in his father, maternal grandfather, and mother; Cancer in his maternal grandfather and paternal grandfather; Depression in his brother, father, and sister; Glaucoma in his father; Inflammatory bowel disease in his brother, father, and sister; Liver disease in his sister; Vision loss in his father.      VITAL SIGNS:    There were no vitals taken for this visit.    BP Readings from Last 3 Encounters:   04/11/23 115/62   03/22/23 91/56   02/28/23 111/68      Wt Readings from Last 10 Encounters:   04/11/23 77.8 kg (171 lb 8 oz)   03/22/23 75.7 kg (166 lb 12.8 oz)   02/28/23 77 kg (169 lb 11.2 oz)   02/14/23 75.3 kg (166 lb)   02/08/23 75.4 kg (166 lb 3.6 oz)   01/31/23 75.6 kg (166 lb 9.6 oz)   01/11/23 73.2 kg (161 lb 6.4 oz)   01/10/23 72.3 kg (159 lb 6.4 oz)   12/27/22 76.2 kg (168 lb)   12/24/22 76.5 kg (168 lb 9.6 oz)      BMI: Estimated body mass index is 24.61 kg/m?? as calculated from the following:    Height as of 04/11/23: 177.8 cm (5' 10).    Weight as of 04/11/23: 77.8 kg (171 lb 8 oz).    BSA: Estimated body surface area is 1.96 meters squared as calculated from the following:    Height as of 04/11/23: 177.8 cm (5' 10).    Weight as of 04/11/23: 77.8 kg (171 lb 8 oz).  PHYSICAL EXAM:    Normal comprehensive exam:     Constitutional:   Alert, oriented x 3, no acute distress, well nourished, and well hydrated.   Mental Status:   Thought organized, appropriate affect, pleasantly interactive, not anxious appearing.   HEENT:   PERRL, conjunctiva clear, anicteric, oropharynx clear, neck supple, no LAD.   Respiratory: Clear to auscultation, unlabored breathing.  Cardiac: Euvolemic, regular rate and rhythm, normal S1 and S2, no murmur.     Abdomen: Soft, normal bowel sounds, non-distended, non-tender, no organomegaly or masses.     Perianal/Rectal Exam Not performed.     Extremities:   No edema, well perfused.   Musculoskeletal: No joint swelling or tenderness noted, no deformities.     Skin: No rashes, jaundice or skin lesions noted. A skin lesion noted in the face (beneath the lower lib) related to his recent skin infection      Neuro: No focal deficits.          ASSESSMENT:        1. UC initially diagnosed 44. S/p colectomy and IPAA in 2 stages for evidence of high-grade dysplasia on surveillance colonoscopy 1996.   2. History of pouchitis previously managed with Canasa suppositories and hydrocortisone suppositories   3. History of Anxiety/depression and PTSD     Course of Disease:    Patient was diagnosed with UC in 1988, initially managed with sulfasalazine and recurrent courses of prednisone.  In 1996, colonoscopy revealed dysplasia and he subsequently underwent total abdominal colectomy with IPAA in 1996/1997.    Pouchoscopy 07/2014 revealed mild inflammation of the pouch and granularity involving the neo-terminal ileum.  Start ciprofloxacin/flagyl for 2 weeks, then ciprofloxacin for another 2 weeks.    01/2016 increase of symptoms once off antibiotics, re start of Cipro.   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 Plan now for Augmentin. Additionally Lomotil 2 tablet 4 times daily.     We will also check calprotectin now.     History of high-grade dysplasia before colectomy: Need for pouchoscopy later on this year ( last pouchoscopy normal in 04/2020) once his clinical course is stabilized.     Kidney function: Currently CKD 3A, which could be due to dehydration or combination of his uncontrolled diabetes over multiple years and dehydration.    Iron deficiency: Latent iron deficiency.  I think he would benefit from an iron infusion and discuss with patient.     Diabetes: Hemoglobin A1c 8.6.  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  GLP-1 agonist would be beneficial in controlling his well his bowel frequency due to negative impact on gastroduodenal and small bowel motility.  However I am not sure if the blood sugar of the patient can be controlled without the use of insulin.    Bone health: Start of 2000 IU Vit D daily  Lab Results   Component Value Date    VITDTOTAL 19.9 (L) 01/10/2023         PLAN:           There are no Patient Instructions on file for this visit.    -------------------------------------------------------------------------------------------------------------    DIAGNOSTIC STUDIES:  I have reviewed all pertinent diagnostic studies, including:      GI Procedures:    04/2020  Normal pouch ,  prepouch and cuff    Last pouchoscopy- 08/04/2014:  Mild non-specific changes were noted in the neo-terminal  ileum (granularity and decreased vascular pattern) and the pouch (scattered erythema and scarred mucosa). biopsies were obtained. Short cuff was seen with some irregularity along the   columnosquamous junction. biopsies were obtained      Pathology :Prepouch, biopsy   - Moderate chronic active enteritis, negative for dysplasia  - No viral cytopathic effect or granulomas identified    B: Pouch, biopsy   - Moderate chronic active enteritis (pouchitis), negative for dysplasia  - No viral cytopathic effect or granulomas identified    C: Cuff, biopsy   - Moderate chronic active colitis (cuffitis), negative for dysplasia  - No viral cytopathic effect or granulomas identified    Laboratory results:    No visits with results within 1 Week(s) from this visit.   Latest known visit with results is:   Lab on 04/11/2023   Component Date Value Ref Range Status    Sodium 04/11/2023 135  135 - 145 mmol/L Final    Potassium 04/11/2023 3.9  3.4 - 4.8 mmol/L Final    Chloride 04/11/2023 105  98 - 107 mmol/L Final    CO2 04/11/2023 23.0  20.0 - 31.0 mmol/L Final    Anion Gap 04/11/2023 7  5 - 14 mmol/L Final    BUN 04/11/2023 21  9 - 23 mg/dL Final    Creatinine 40/97/3532 1.50 (H)  0.73 - 1.18 mg/dL Final    BUN/Creatinine Ratio 04/11/2023 14   Final    eGFR CKD-EPI (2021) Male 04/11/2023 50 (L)  >=60 mL/min/1.77m2 Final    Glucose 04/11/2023 165  70 - 179 mg/dL Final    Calcium 99/24/2683 9.2  8.7 - 10.4 mg/dL Final    Albumin 41/96/2229 3.6  3.4 - 5.0 g/dL Final    Total Protein 04/11/2023 6.5  5.7 - 8.2 g/dL Final    Total Bilirubin 04/11/2023 1.1  0.3 - 1.2 mg/dL Final    AST 79/89/2119 23  <=34 U/L Final    ALT 04/11/2023 15  10 - 49 U/L Final    Alkaline Phosphatase 04/11/2023 154 (H)  46 - 116 U/L Final    WBC 04/11/2023 5.2  3.6 - 11.2 10*9/L Final    RBC 04/11/2023 3.96 (L)  4.26 - 5.60 10*12/L Final    HGB 04/11/2023 12.0 (L)  12.9 - 16.5 g/dL Final    HCT 41/74/0814 34.2 (L)  39.0 - 48.0 % Final    MCV 04/11/2023 86.4  77.6 - 95.7 fL Final    MCH 04/11/2023 30.2  25.9 - 32.4 pg Final    MCHC 04/11/2023 34.9  32.0 - 36.0 g/dL Final    RDW 48/18/5631 13.9  12.2 - 15.2 % Final    MPV 04/11/2023 7.2  6.8 - 10.7 fL Final    Platelet 04/11/2023 141 (L)  150 - 450 10*9/L Final    Neutrophils % 04/11/2023 58.0  % Final    Lymphocytes % 04/11/2023 21.1  % Final    Monocytes % 04/11/2023 11.4  % Final    Eosinophils % 04/11/2023 8.3  % Final    Basophils % 04/11/2023 1.2  % Final    Absolute Neutrophils 04/11/2023 3.0  1.8 - 7.8 10*9/L Final    Absolute Lymphocytes 04/11/2023 1.1  1.1 - 3.6 10*9/L Final    Absolute Monocytes 04/11/2023 0.6  0.3 - 0.8 10*9/L Final    Absolute Eosinophils 04/11/2023 0.4  0.0 -  0.5 10*9/L Final    Absolute Basophils 04/11/2023 0.1  0.0 - 0.1 10*9/L Final

## 2023-04-29 NOTE — Unmapped (Unsigned)
[facility] RETURN VISIT        PATIENT IDENTIFICATION   Keith Ballard is a 67 y.o. 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      CANCER STAGING     Cancer Staging   Gallbladder cancer (CMS-HCC)  Staging form: Gallbladder, AJCC 8th Edition  - Pathologic: Stage IIB (pT2b, pN0, cM0) - Signed by Jeannine Boga, MD on 01/31/2023      ONCOLOGIC HISTORY     Gallbladder adenocarcinoma, pT2bN0  07/26/22, OSH CT for lymphoma treatment response assessment incidentally revealed multiple areas of soft tissue nodularity along the gallbladder wall the largest measuring up to 1.2 cm near the fundus.  08/30/22, [place] RUQ gallbladder showed indeterminant polypoid lesion with internal vascularity arising from the wall of the gallbladder.   10/15/22, MRI abdomen enhancing intraluminal gallbladder lesions measuring 1.8 and 0.6 cm. Detailed anatomy is limited due to motion artifact. However grossly, there is no appreciable wall thickening or evidence of invasion. Gallbladder polyps/ primary gallbladder neoplasm is favored over lymphoma.  10/26/22, CA 19-9=15.3  11/13/22, cholecystectomy and liver biopsy; Pathology showed a 1.5 cm moderately to poorly differentiated adenocarcinoma with micropapillary features invading through the muscularis propria into the peribiliary connective tissue, cystic duct and liver bed margins are negative, +LVI, +PNI. Segment 5 biopsy (prior lymphoma site) negative for carcinoma. Segment 4 biopsy showed features of periductal concentric fibrosis and focal copper accumulation and minimal macrovascular steatosis, negative for carcinoma.  12/26/22, portal lymphadenectomy and segment 4B/5 partial hepatectomy; Final path negative for carcinoma.  01/31/23, initial consultation at [facility]  02/08/23, DPYD phenotype normal.  02/26/23, initiated on adjuvant capecitabine; CT CAP showed no evidence of recurrence/metastasis  03/22/23, C2 adjuvant capecitabine at 1500 mg twice daily on days 1-14   04/13/23, C3 adjuvant capecitabine (delayed due to weakness and diarrhea). 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      MOLECULAR DIAGNOSTICS     N/A    ASSESSMENT AND PLAN      # Gallbladder adenocarcinoma, pT2bN0  Incidentally identified on cholecystectomy specimen now s/p portal lymphadenectomy and segment 4B/5 partial hepatectomy on 12/26/22.     Keith Ballard is here for follow up. He states the side effects from chemo was a little worse after C2. He is feeling better now. C3 ***     PLAN  - Capecitabine dose reduced to 1000 mg qam and 1500 mg qpm on days 1-14 and off for 7 days  - RTC on 8/8 for labs and follow up with Jasie Meleski    # Dizziness/Lightheadedness: likely due to orthostatic hypotension. Chronic, onset prior to starting capecitabine.   - Advised patient to stay hydrated  - Fall precautions  - Consider brain imaging if symptoms worsens.     # Chemotherapy induced nausea: well controlled  - Anti-emetic PRN    # Diarrhea:   - anti diarrheal PRN        No orders of the defined types were placed in this encounter.      The natural history of cancer was reviewed and discussed with the patient and family members. Treatment options as well as risks and benefits were discussed with the patient/family. Patient and  family member's questions answered to their satisfaction      INTERVAL HISTORY   Keith Ballard is here for follow up. He states the side effects from chemo was a little worse after C2 but he is feeling better now. Has not had any falls recently. Still feels dizzy and lightheaded mostly when he stands up. He waked up at night the other day and walked to the bathroom and found himself walking a serpentine line. This resolved after he took time and got himself steady. No other complains.      REVIEW OF SYSTEMS   The remainder of a comprehensive 10 systems review is negative.     Allergies and Medications reviewed as per chart    PHYSICAL EXAMS     There were no vitals filed for this visit.            04/11/23 77.8 kg (171 lb 8 oz)   03/22/23 75.7 kg (166 lb 12.8 oz)   02/28/23 77 kg (169 lb 11.2 oz)      ECOG: (1) Restricted in physically strenuous activity, ambulatory and able to do work of light nature    GENERAL: well developed, well nourished, in no distress  PSYCH: full and appropriate range of affect with good insight and judgement  HEENT: NCAT, pupils equal, sclerae anicteric   EXT: warm and well perfused, no edema  SKIN: No rashes       OBJECTIVE DATA   LABS:   Results for orders placed or performed in visit on 04/11/23   Comprehensive Metabolic Panel   Result Value Ref Range    Sodium 135 135 - 145 mmol/L    Potassium 3.9 3.4 - 4.8 mmol/L    Chloride 105 98 - 107 mmol/L    CO2 23.0 20.0 - 31.0 mmol/L    Anion Gap 7 5 - 14 mmol/L    BUN 21 9 - 23 mg/dL    Creatinine 2.54 (H) 0.73 - 1.18 mg/dL    BUN/Creatinine Ratio 14     eGFR CKD-EPI (2021) Male 50 (L) >=60 mL/min/1.77m2    Glucose 165 70 - 179 mg/dL    Calcium 9.2 8.7 - 27.0 mg/dL    Albumin 3.6 3.4 - 5.0 g/dL    Total Protein 6.5 5.7 - 8.2 g/dL    Total Bilirubin 1.1 0.3 - 1.2 mg/dL    AST 23 <=62 U/L    ALT 15 10 - 49 U/L    Alkaline Phosphatase 154 (H) 46 - 116 U/L   CBC w/ Differential   Result Value Ref Range    WBC 5.2 3.6 - 11.2 10*9/L    RBC 3.96 (L) 4.26 - 5.60 10*12/L    HGB 12.0 (L) 12.9 - 16.5 g/dL    HCT 37.6 (L) 28.3 - 48.0 %    MCV 86.4 77.6 - 95.7 fL    MCH 30.2 25.9 - 32.4 pg    MCHC 34.9 32.0 - 36.0 g/dL    RDW 15.1 76.1 - 60.7 %    MPV 7.2 6.8 - 10.7 fL    Platelet 141 (L) 150 - 450 10*9/L    Neutrophils % 58.0 %    Lymphocytes % 21.1 %    Monocytes % 11.4 %    Eosinophils % 8.3 %    Basophils % 1.2 %    Absolute Neutrophils 3.0 1.8 - 7.8 10*9/L    Absolute Lymphocytes 1.1 1.1 - 3.6 10*9/L    Absolute Monocytes 0.6 0.3 -  0.8 10*9/L    Absolute Eosinophils 0.4 0.0 - 0.5 10*9/L    Absolute Basophils 0.1 0.0 - 0.1 10*9/L     *Note: Due to a large number of results and/or encounters for the requested time period, some results have not been displayed. A complete set of results can be found in Results Review.     RADIOLOGY RESULTS   CT Abdomen Pelvis W Contrast  Narrative: EXAM: CT ABDOMEN PELVIS W CONTRAST  ACCESSION: [id number] UN  CLINICAL INDICATION: 67 years old with cancer staging  - C23 - Gallbladder cancer (CMS - HCC)      COMPARISON: 12/01/2022    TECHNIQUE: A helical CT scan of the abdomen and pelvis was obtained following IV contrast from the lung bases through the pubic symphysis. Images were reconstructed in the axial plane. Coronal and sagittal reformatted images were also provided for further evaluation.     FINDINGS:    LOWER CHEST: Chest CT was performed concurrently, will be dictated separately. Please see that report for additional discussion.    LIVER: Liver is normal in size and contour. Stable, linearly oriented, 2 cm hypodensity in segment VIII (2:21) and a rounded lesion along the gallbladder fossa in segment V measuring 1.7 cm (2:43). No new liver lesions are identified.    BILIARY: Gallbladder is surgically absent. There is low-attenuation along the gallbladder fossa, unchanged. No intrahepatic biliary ductal dilation. Common bile duct is normal in caliber.    SPLEEN: Mild heterogeneous enhancement of the spleen without discrete lesion. Spleen is normal in size.    PANCREAS: Atrophic pancreas. No focal lesions.  No ductal dilation.    ADRENAL GLANDS: No suspicious adrenal nodules.    KIDNEYS/URETERS: Kidneys enhance symmetrically. There is no hydronephrosis.    BLADDER: Circumferential bladder wall thickening, may be exaggerated by under distention.    REPRODUCTIVE ORGANS: Prostate gland is normal in size. Small fat-containing left inguinal hernia.    GI TRACT: Postoperative changes of total abdominal colectomy. Stomach is distended with fluid and debris    PERITONEUM, RETROPERITONEUM AND MESENTERY: No pneumoperitoneum or drainable collection. Evolving changes of recent surgery in the ventral abdominal wall and upper peritoneum anterior to the stomach. No significant ascites.    LYMPH NODES: There are a few prominent but subcentimeter short axis celiac axis and retroperitoneal nodes which could be reactive in the setting of recent surgery.    VESSELS: Hepatic and portal veins are patent.  Normal caliber aorta.      BONES and SOFT TISSUES: No aggressive osseous lesions. Evolving operative changes of the ventral abdominal wall.  Impression: --No new sites of metastatic disease in the abdomen or pelvis. Stable hypodense liver lesions.  --Evolving changes of recent surgery in the ventral abdominal wall and peritoneum. Attention on follow-up to ensure continued resolution over time.  --Prominent celiac axis and retroperitoneal lymph nodes, nonspecific but may be reactive in the setting of recent surgery.  --Circumferential bladder wall thickening without significant surrounding inflammation. Correlate for cystitis.  CT Chest Wo Contrast  Narrative: EXAM: CT CHEST WO CONTRAST  DATE: 02/28/2023 9:44 AM  ACCESSION: [id number] UN  DICTATED: 02/28/2023 9:45 AM  INTERPRETATION LOCATION: MAIN CAMPUS    CLINICAL INDICATION: 67 year old male with staging  - C23 - Gallbladder cancer (CMS - HCC).    COMPARISON: March 8.    TECHNIQUE: Contiguous 0.6 or 1 mm and 2 mm axial images were reconstructed through the chest following a single breath hold helical acquisition.  Images were reformatted in the  axial and sagittal planes.  MIP slabs were also constructed.    FINDINGS:    LUNGS AND AIRWAYS: The lungs are clear.  The central airways are patent.     PLEURA: No pleural fluid or pneumothorax.    MEDIASTINUM AND LYMPH NODES: No enlarged intrathoracic or axillary lymph nodes are present.  No other mediastinal abnormality.    HEART AND VASCULATURE:The cardiac chambers are normal in size.  There is no pericardial effusion.  Ascending and descending aorta normal in caliber.  Pulmonary artery normal in size. Coronary calcification. Catheter right atrium.    BONES AND SOFT TISSUES: Unremarkable.    UPPER ABDOMEN: Reported separately.    OTHER: No other significant findings.  Impression: No intrathoracic metastasis.         CARE TEAM     [facility]: Dr. Renae Fickle     [facility] Surgical Oncology: Dr. Tora Duck     FOLLOW UP     Future Appointments   Date Time Provider Department Center   05/02/2023  8:30 AM Rod Mae, FNP [facility]   06/13/2023  9:00 AM [name], MD [facility]   07/12/2023  2:00 PM [facility] CT RM 4 [facility]   07/19/2023 12:30 PM Marion Downer, MD [facility]

## 2023-05-02 ENCOUNTER — Ambulatory Visit: Admit: 2023-05-02 | Payer: MEDICARE

## 2023-05-02 DIAGNOSIS — C23 Malignant neoplasm of gallbladder: Principal | ICD-10-CM

## 2023-05-02 MED ORDER — LANTUS SOLOSTAR U-100 INSULIN 100 UNIT/ML (3 ML) SUBCUTANEOUS PEN
Freq: Every evening | SUBCUTANEOUS | 0 refills | 100 days
Start: 2023-05-02 — End: 2023-06-01

## 2023-05-02 NOTE — Unmapped (Addendum)
Clinical Pharmacist Practitioner: GI Oncology Clinic    Patient Name: Keith Ballard  Patient Age: 67 y.o.  Encounter Date: 05/02/2023  Primary Oncologist: Keith Boga, MD    Reason for visit: oral chemotherapy follow-up    ASSESSMENT & PLAN:  1. Gallbladder adenocarcinoma: dose was reduced last cycle due to tolerability but patient states dizziness/light-headedness has not improved since dose reduction and has been a chronic issue. Instructed patient to reach out to prescribers of medications that cause him dizziness (I.e. hydroxyzine, quetiapine) to discuss if any changes can be made. Informed patient to reach out to [facility] to schedule delivery. Patient missed appointment today so will plan for him to come in for labs in 1 week.   - labs and appt in 1 week, patient currently on off week (capecitabine 1000 mg in AM and 1500 mg PO in PM on days 1-14 of each 21-day cycle) and instructed to call [facility] to schedule delivery for next cycle but will hold off on starting next cycle until after pt gets lab drawn    2. Diarrhea: Grade 2 - patient started alternating Lomotil with Imodium but sometimes forgets  - May alternate 2 Lomotil with 2 Imodium every 2-4 hours around the clock (may do 2 hours during daytime, 4 hours during nighttime)   - Ensure adequate hydration    3. Dizziness: Main concern of patient. Capecitabine dose was reduced last cycle but no improvement of dizziness. Was present prior to treatment but may consider imaging if needed if does not improve. Encouraged patient to follow up with other prescribers regarding medications that cause him to be dizzy and encouraged patient to get up slowly and stay hydrated. Patient and Dr. Timoteo Ballard discussed fall risk and starting PT but patient has not started.     4. Nausea: Grade 1 but patient not using anything. Not addressed today.  - Start ondansetron 8 mg PO every 8 hours prn N/V at first sign of nausea    5. HFS: Grade 1 and patient is using a hand lotion. Encouraged using a non-scented thick moisturizer or lotion BID on hands and feet for prevention. Not addressed today.  - follow-up on prevention plan and consider steroid cream if worsened    6. Mucositis: Grade 1 with sores on lip and stings with toothpaste but otherwise not causing pain or affecting eating. Not addressed today.  - Start baking soda salt water rinses (instructions included in discharge summary)  - Switch to children's toothpaste and avoid alcohol based mouthwash      F/U: labs and clinic appt on 05/09/23    ______________________________________________________________________    HPI: Keith Ballard is a 67 y.o. 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     Oral chemotherapy: capecitabine 1500 mg (500 mg x3) PO BID on days 1-14 of each 21-day cycle  Start date: ~02/26/23  Specialty pharmacy: [facility]    Interim History: Keith Ballard did not arrive for clinic appointment today with Keith Charter, FNP, so he was contacted via telephone to check in. He states he missed his appointment due to weather and reports not feeling that well today. He endorses light-headedness and dizziness and states it has been a chronic problem. He also endorses diarrhea and states he has been alternating loperamide and Lomotil which helps but sometimes forgets to take the medication. He states he is not sure if he started his cycle on 7/20 but  states his last dose was on Sunday so he may have started later. He endorses not always following the treatment schedule exactly but does it his own way, for example taking 3 tablets twice daily, but is aware that his dose was decreased to 2 tablets in the morning and 3 tablets in the evening. Overall, he states that since the dose reduction, the symptoms of light headedness and dizziness have not improved and it depends on the day as some days are worse than others. He has not had medication delivered and states he set up delivery once but not recently. He went over some of his medications as he states some can cause dizziness. He reports taking hydroxyzine once daily as he has panic attacks and anxiety and he takes it at nighttime, and this one causes dizziness. He also reports quetiapine causes dizziness. He states the midodrine does not cause dizziness since it is not listed as a warning. He reports not starting PT and states he does not like it and has tried it in the past. He reports getting mail from [facility] billing and is concerned about a cost.     Adherence: unclear as he sometimes will do things a bit different and cycle will be a bit off  Drug-Drug Interactions: Qtc prolongation with venlafaxine and ondansetron, risk of decreased efficacy of capecitabine with omeprazole - did not assess if pt is taking (Monitor efficacy while on concurrent pantoprazole (some lower quality data suggesting possible DDI, although the interaction is doubtful based on limited clinical documentation, contradictory PK results, and inconsistent in vitro results: J Oncol Pharm Pract. 2019 Oct;25(7):1705-1711).)     Oncology History:  Hematology/Oncology History   Non-Hodgkin lymphoma (CMS-HCC) (Resolved)   05/28/2020 Initial Diagnosis    Lymphoma involving liver (CMS-HCC)     06/01/2020 - 06/01/2020 Chemotherapy    IP LYMPHOMA INTRATHECAL TRIPLE  cytarabine 100 mg intrathecal     06/03/2020 - 06/10/2020 Chemotherapy    IP/OP LYMPHOMA R-DA-EPOCH (OP RITUXIMAB + PEGFILGRASTIM ON DAY 6) (Q24H)  riTUXimab 375 mg/m2 IV on day 6, etoposide 50 mg/m2 days 1 to 4, DOXOrubicin 10 mg/m2 days 1 to 4, vinCRIStine 0.4 mg/m2 days 1 to 4, cyclophosphamide 750 mg/m2 day 5, prednisone days 1 to 5     07/15/2020 - 10/06/2020 Chemotherapy    OP LYMPHOMA R-CHOP (EVERY 21 DAYS)  riTUXimab 375 mg/m2 (standard IV, rapid infusion IV, or riTUXimab Hycela 1,400 mg SQ) on day 1, vinCRIStine 1.4 mg/m2 (max 2 mg) IV on day 1, DOXOrubicin 50 mg/m2 IV on day 1, cyclophosphamide 750 mg/m2 IV on day 1, predniSONE 100 mg PO on days 1-5, every 21-day cycle     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)   06/12/2020 Initial Diagnosis    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)     10 /22/2021 - 10/06/2020 Chemotherapy    OP LYMPHOMA R-CHOP (EVERY 21 DAYS)  riTUXimab 375 mg/m2 (standard IV, rapid infusion IV, or riTUXimab Hycela 1,400 mg SQ) on day 1, vinCRIStine 1.4 mg/m2 (max 2 mg) IV on day 1, DOXOrubicin 50 mg/m2 IV on day 1, cyclophosphamide 750 mg/m2 IV on day 1, predniSONE 100 mg PO on days 1-5, every 21-day cycle     Gallbladder cancer (CMS-HCC)   01/31/2023 Initial Diagnosis    Gallbladder cancer (CMS-HCC)     01/31/2023 -  Cancer Staged    Staging form: Gallbladder, AJCC 8th Edition  - Pathologic: Stage IIB (pT2b,  pN0, cM0) - Signed by Keith Boga, MD on 01/31/2023           Medications:  Current Outpatient Medications   Medication Sig Dispense Refill    acetaminophen (TYLENOL) 500 MG tablet Take 2 tablets (1,000 mg total) by mouth every eight (8) hours. 90 tablet 0    capecitabine (XELODA) 500 MG tablet Take 2 tablets (1000 mg) by mouth in the morning and 3 tablets (1500 mg) in the evening for 14 days. Only take on days 1 through 14 of each 21-day cycle. Take with full glass of water, within 30 minutes after a meal. Swallow tablets whole; do not cut or crush. 70 tablet 3    diphenoxylate-atropine (LOMOTIL) 2.5-0.025 mg per tablet Take 2 tablets by mouth four (4) times a day. 720 tablet 0    enoxaparin (LOVENOX) 40 mg/0.4 mL Syrg Inject 0.4 mL (40 mg total) under the skin daily. (Patient not taking: Reported on 02/28/2023) 11.2 mL 0    hydrOXYzine (ATARAX) 25 MG tablet Take one or two tablets at bedtime as needed for anxiety or sleep 60 tablet 11    insulin glargine (BASAGLAR, LANTUS) 100 unit/mL (3 mL) injection pen Inject 0.15 mL (15 Units total) under the skin nightly. 15 mL 0 JARDIANCE 10 mg tablet TAKE ONE TABLET BY MOUTH DAILY AT 9AM FOR KIDNEY PROTECTION      lidocaine 4 % patch Place 1 patch on the skin daily as needed (pain). Apply for 12 hours then remove for 12 hours. (Patient not taking: Reported on 02/28/2023) 15 patch 0    loperamide (IMODIUM A-D) 2 mg tablet Take 2 tablets (4 mg) by mouth for first loose stool followed by 1 tablet (2 mg) after each loose stool thereafter (maximum of 8 tablets per day). 30 tablet 11    midodrine (PROAMATINE) 5 MG tablet Take 2 tablets (10 mg total) by mouth in the morning.      omeprazole (PRILOSEC) 40 MG capsule       ondansetron (ZOFRAN) 8 MG tablet Take 1 tablet (8 mg total) by mouth every eight (8) hours as needed for nausea. 60 tablet 3    pen needle, diabetic 32 gauge x 5/32 (4 mm) Ndle Use with insulin up to 4 times/day as needed. 100 each 0    prochlorperazine (COMPAZINE) 10 MG tablet Take 1 tablet (10 mg total) by mouth every six (6) hours as needed (Nausea/Vomiting). 60 tablet 3    QUEtiapine (SEROQUEL) 100 MG tablet Take 1 tablet (100 mg total) by mouth nightly as needed (sleep and anxiety). 90 tablet 3    venlafaxine (EFFEXOR-XR) 150 MG 24 hr capsule Take 1 capsule (150 mg total) by mouth daily. 90 capsule 3     No current facility-administered medications for this visit.       Laboratory Data:  No visits with results within 1 Day(s) from this visit.   Latest known visit with results is:   Lab on 04/11/2023   Component Date Value    Sodium 04/11/2023 135     Potassium 04/11/2023 3.9     Chloride 04/11/2023 105     CO2 04/11/2023 23.0     Anion Gap 04/11/2023 7     BUN 04/11/2023 21     Creatinine 04/11/2023 1.50 (H)     BUN/Creatinine Ratio 04/11/2023 14     eGFR CKD-EPI (2021) Male 04/11/2023 50 (L)     Glucose 04/11/2023 165     Calcium 04/11/2023 9.2  Albumin 04/11/2023 3.6     Total Protein 04/11/2023 6.5     Total Bilirubin 04/11/2023 1.1     AST 04/11/2023 23     ALT 04/11/2023 15     Alkaline Phosphatase 04/11/2023 154 (H) WBC 04/11/2023 5.2     RBC 04/11/2023 3.96 (L)     HGB 04/11/2023 12.0 (L)     HCT 04/11/2023 34.2 (L)     MCV 04/11/2023 86.4     MCH 04/11/2023 30.2     MCHC 04/11/2023 34.9     RDW 04/11/2023 13.9     MPV 04/11/2023 7.2     Platelet 04/11/2023 141 (L)     Neutrophils % 04/11/2023 58.0     Lymphocytes % 04/11/2023 21.1     Monocytes % 04/11/2023 11.4     Eosinophils % 04/11/2023 8.3     Basophils % 04/11/2023 1.2     Absolute Neutrophils 04/11/2023 3.0     Absolute Lymphocytes 04/11/2023 1.1     Absolute Monocytes 04/11/2023 0.6     Absolute Eosinophils 04/11/2023 0.4     Absolute Basophils 04/11/2023 0.1         I spent 35 minutes with KeithLatorre in direct patient care.    Trilby Drummer, PharmD, BCOP, CPP  GI Oncology Clinical Pharmacist Practitioner

## 2023-05-02 NOTE — Unmapped (Signed)
[facility] Refill Coordination Note    Specialty Medication(s) to be Shipped:   Hematology/Oncology: Capecitabine 500mg , directions: 2 tab in am and 3 tab pm for 14 days of 21 day cycle     Other medication(s) to be shipped:  lantus     Keith Ballard, DOB: 1956-05-30  Phone: [phone] (home) [phone] (work)      All above HIPAA information was verified with patient.     Was a Nurse, learning disability used for this call? No    Completed refill call assessment today to schedule patient's medication shipment from the [facility] [phone]).  All relevant notes have been reviewed.     Specialty medication(s) and dose(s) confirmed: Regimen is correct and unchanged.   Changes to medications: Merik reports no changes at this time.  Changes to insurance: No  New side effects reported not previously addressed with a pharmacist or physician: None reported  Questions for the pharmacist: No    Confirmed patient received a Conservation officer, historic buildings and a Surveyor, mining with first shipment. The patient will receive a drug information handout for each medication shipped and additional FDA Medication Guides as required.       DISEASE/MEDICATION-SPECIFIC INFORMATION        N/A    SPECIALTY MEDICATION ADHERENCE     Medication Adherence    Patient reported X missed doses in the last month: 0  Specialty Medication: capecitabine 500 MG tablet  Patient is on additional specialty medications: No  Patient is on more than two specialty medications: No  Any gaps in refill history greater than 2 weeks in the last 3 months: no  Demonstrates understanding of importance of adherence: yes  Informant: patient  Reliability of informant: reliable  Provider-estimated medication adherence level: good  Patient is at risk for Non-Adherence: No  Reasons for non-adherence: no problems identified  Confirmed plan for next specialty medication refill: delivery by pharmacy  Refills needed for supportive medications: not needed          Refill Coordination    Has the Patients' Contact Information Changed: No  Is the Shipping Address Different: No         Were doses missed due to medication being on hold? No    Capeciatbine  500 mg: 0 days of medicine on hand       REFERRAL TO PHARMACIST     Referral to the pharmacist: Not needed      [facility]     Shipping address confirmed in Epic.       Delivery Scheduled: Yes, Expected medication delivery date: 08/13.     Medication will be delivered via Same Day Courier to the prescription address in Epic WAM.    Antonietta Barcelona   [facility] Specialty Technician

## 2023-05-07 DIAGNOSIS — Z794 Long term (current) use of insulin: Principal | ICD-10-CM

## 2023-05-07 DIAGNOSIS — E1140 Type 2 diabetes mellitus with diabetic neuropathy, unspecified: Principal | ICD-10-CM

## 2023-05-09 MED FILL — QUETIAPINE 100 MG TABLET: ORAL | 90 days supply | Qty: 90 | Fill #0

## 2023-05-09 MED FILL — ACETAMINOPHEN 500 MG TABLET: ORAL | 15 days supply | Qty: 90 | Fill #0

## 2023-05-09 MED FILL — HYDROXYZINE HCL 25 MG TABLET: ORAL | 30 days supply | Qty: 60 | Fill #0

## 2023-05-09 NOTE — Unmapped (Signed)
Clinical Pharmacist Brief Note:    Patient has not arrived for labs or scheduled appointment. Called patient to check in. Patient answered call and states he was sleepy and tired and took a nap so missed the appointment. He states he is not able to come in today and would like to reschedule his appointment to some time in the latter half of next week as his brother is coming into town this weekend. He also reports the capecitabine has not been delivered yet and he has not started the medication. Informed him we can reach out to [facility] to confirm why medication has not been delivered, and if it gets delivered, not to start taking it until after he is seen in clinic with labs.     Trilby Drummer, PharmD, BCOP, CPP  GI Oncology Clinical Pharmacist Practitioner

## 2023-05-17 ENCOUNTER — Ambulatory Visit: Admit: 2023-05-17 | Payer: MEDICARE

## 2023-05-17 NOTE — Unmapped (Addendum)
Clinical Pharmacist Brief Note:    Patient did not arrive for scheduled appointments for labs or clinic visit. Attempted to call patient but call went straight to voicemail. Attempted to call patient later in day but also went straight to voicemail. Patient has appointment scheduled on 8/29 with Dr. Timoteo Gaul. Unable to get ahold of patient to confirm he has not started capecitabine.    Trilby Drummer, PharmD, BCOP, CPP  GI Oncology Clinical Pharmacist Practitioner

## 2023-05-20 NOTE — Unmapped (Signed)
I spoke with patient Keith Ballard to confirm appointments on the following 06/14/23      Joycelyn Das Tereka Thorley

## 2023-05-20 NOTE — Unmapped (Signed)
error 

## 2023-05-20 NOTE — Unmapped (Signed)
Called Mr. Ringstaff to follow-up on no-show with Elease Hashimoto, pharmacist last week. Patient reports he did receive Capecitabine prescription on Friday and is aware that he should not begin taking Capecitabine until he is evaluated in clinic on Thursday. Confirmed appointment date and time with patient. Patient ongoing dizziness/lightheadedness. He denies falls. Encouraged hydration, slow position changes. Reviewed ER precautions with patient. Pt verbalized understanding.

## 2023-05-22 MED ORDER — LANTUS SOLOSTAR U-100 INSULIN 100 UNIT/ML (3 ML) SUBCUTANEOUS PEN
Freq: Every evening | SUBCUTANEOUS | 0 refills | 100.00000 days
Start: 2023-05-22 — End: 2023-06-21

## 2023-05-23 NOTE — Unmapped (Signed)
Called patient this morning as he did not show up for appt with Dr. Timoteo Gaul. Patient reported he forgot about appointment this morning. Patient amenable to rescheduling tomorrow, 8/30, at 1:30PM. Confirmed date and time with patient.

## 2023-05-24 ENCOUNTER — Ambulatory Visit: Admit: 2023-05-24 | Discharge: 2023-05-25 | Payer: MEDICARE

## 2023-05-24 DIAGNOSIS — C23 Malignant neoplasm of gallbladder: Principal | ICD-10-CM

## 2023-05-24 MED FILL — LOPERAMIDE 2 MG CAPSULE: 4 days supply | Qty: 30 | Fill #1

## 2023-05-24 NOTE — Unmapped (Signed)
Please stop taking Capecitabine. We will get blood work and CT scan in the next week or two. I'll be in touch with the results when I have them.

## 2023-05-24 NOTE — Unmapped (Signed)
[facility] RETURN VISIT        PATIENT IDENTIFICATION   Keith Ballard is a 67 y.o. 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      CANCER STAGING     Cancer Staging   Gallbladder cancer (CMS-HCC)  Staging form: Gallbladder, AJCC 8th Edition  - Pathologic: Stage IIB (pT2b, pN0, cM0) - Signed by Jeannine Boga, MD on 01/31/2023      ONCOLOGIC HISTORY     Gallbladder adenocarcinoma, pT2bN0  07/26/22, OSH CT for lymphoma treatment response assessment incidentally revealed multiple areas of soft tissue nodularity along the gallbladder wall the largest measuring up to 1.2 cm near the fundus.  08/30/22, [place] RUQ gallbladder showed indeterminant polypoid lesion with internal vascularity arising from the wall of the gallbladder.   10/15/22, MRI abdomen enhancing intraluminal gallbladder lesions measuring 1.8 and 0.6 cm. Detailed anatomy is limited due to motion artifact. However grossly, there is no appreciable wall thickening or evidence of invasion. Gallbladder polyps/ primary gallbladder neoplasm is favored over lymphoma.  10/26/22, CA 19-9=15.3  11/13/22, cholecystectomy and liver biopsy; Pathology showed a 1.5 cm moderately to poorly differentiated adenocarcinoma with micropapillary features invading through the muscularis propria into the peribiliary connective tissue, cystic duct and liver bed margins are negative, +LVI, +PNI. Segment 5 biopsy (prior lymphoma site) negative for carcinoma. Segment 4 biopsy showed features of periductal concentric fibrosis and focal copper accumulation and minimal macrovascular steatosis, negative for carcinoma.  12/26/22, portal lymphadenectomy and segment 4B/5 partial hepatectomy; Final path negative for carcinoma.  01/31/23, initial consultation at [facility]  02/08/23, DPYD phenotype normal.  02/26/23, initiated on adjuvant capecitabine; CT CAP showed no evidence of recurrence/metastasis  03/22/23, C2 adjuvant capecitabine at 1500 mg twice daily on days 1-14   04/11/23, C3 adjuvant capecitabine (delayed due to weakness and diarrhea). 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      MOLECULAR DIAGNOSTICS     N/A    ASSESSMENT AND PLAN      # Gallbladder adenocarcinoma, pT2bN0  Incidentally identified on cholecystectomy specimen now s/p portal lymphadenectomy and segment 4B/5 partial hepatectomy on 12/26/22.     Keith Ballard has missed several of his follow up appointments with [place]. Neither he nor his wife remembers how many capecitabine tablets he is taking and for how many days, despite close follow up and education multiple times by [place] and our pharmacist. Meanwhile, he continues to have frequent falls which he has been experiencing prior to starting capecitabine. Given the above, I do not think it is safe for him to continue capecitabine. I recommended to stop taking capecitabine and we will proceed with surveillance from now on.     PLAN  - Discontinue adjuvant capecitabine given poor adherence and frequent falls  - Labs and repeat CT on 9/17  - Follow up on 9/20 to review CT and labs    # Dizziness/Lightheadedness: likely due to orthostatic hypotension. Chronic, onset prior to starting capecitabine.   - Advised patient to stay hydrated  - Fall precautions  - Consider brain imaging if symptoms worsens.         Orders Placed This Encounter   Procedures  CBC w/ Differential    Comprehensive Metabolic Panel    Cancer Antigen 19-9       The natural history of cancer was reviewed and discussed with the patient and family members. Treatment options as well as risks and benefits were discussed with the patient/family. Patient and family member's questions answered to their satisfaction    I spent a total of 40 minutes in both face-to-face and non-face-to-face activities for this visit on the date of this encounter.        INTERVAL HISTORY   Keith Ballard is here for follow up. He does not remember how many chemo pills he takes in the morning or in the evening, nor does he remember when was the last day he took them. He continues to have falls on a regular basis although that has not changed. He is aware that he's missed several of his oncology follow up appointments and he apologizes but it's been difficult for me to remember.      REVIEW OF SYSTEMS   The remainder of a comprehensive 10 systems review is negative.     Allergies and Medications reviewed as per chart    PHYSICAL EXAMS     Vitals:    05/24/23 1323   BP: 138/67   Pulse: 89   Resp: 16   Temp: 37.1 ??C (98.8 ??F)   SpO2: 98%               05/24/23 74.6 kg (164 lb 6.4 oz)   04/11/23 77.8 kg (171 lb 8 oz)   03/22/23 75.7 kg (166 lb 12.8 oz)      ECOG: (1) Restricted in physically strenuous activity, ambulatory and able to do work of light nature    GENERAL: well developed, well nourished, in no distress  PSYCH: full and appropriate range of affect with good insight and judgement  HEENT: NCAT, pupils equal, sclerae anicteric   EXT: warm and well perfused, no edema  SKIN: No rashes       OBJECTIVE DATA   LABS:   Results for orders placed or performed in visit on 04/11/23   Comprehensive Metabolic Panel   Result Value Ref Range    Sodium 135 135 - 145 mmol/L    Potassium 3.9 3.4 - 4.8 mmol/L    Chloride 105 98 - 107 mmol/L    CO2 23.0 20.0 - 31.0 mmol/L    Anion Gap 7 5 - 14 mmol/L    BUN 21 9 - 23 mg/dL    Creatinine 1.61 (H) 0.73 - 1.18 mg/dL    BUN/Creatinine Ratio 14     eGFR CKD-EPI (2021) Male 50 (L) >=60 mL/min/1.49m2    Glucose 165 70 - 179 mg/dL    Calcium 9.2 8.7 - 09.6 mg/dL    Albumin 3.6 3.4 - 5.0 g/dL    Total Protein 6.5 5.7 - 8.2 g/dL    Total Bilirubin 1.1 0.3 - 1.2 mg/dL    AST 23 <=04 U/L ALT 15 10 - 49 U/L    Alkaline Phosphatase 154 (H) 46 - 116 U/L   CBC w/ Differential   Result Value Ref Range    WBC 5.2 3.6 - 11.2 10*9/L    RBC 3.96 (L) 4.26 - 5.60 10*12/L    HGB 12.0 (L) 12.9 - 16.5 g/dL    HCT 54.0 (L) 98.1 - 48.0 %    MCV 86.4 77.6 - 95.7 fL    MCH 30.2 25.9 - 32.4 pg  MCHC 34.9 32.0 - 36.0 g/dL    RDW 16.1 09.6 - 04.5 %    MPV 7.2 6.8 - 10.7 fL    Platelet 141 (L) 150 - 450 10*9/L    Neutrophils % 58.0 %    Lymphocytes % 21.1 %    Monocytes % 11.4 %    Eosinophils % 8.3 %    Basophils % 1.2 %    Absolute Neutrophils 3.0 1.8 - 7.8 10*9/L    Absolute Lymphocytes 1.1 1.1 - 3.6 10*9/L    Absolute Monocytes 0.6 0.3 - 0.8 10*9/L    Absolute Eosinophils 0.4 0.0 - 0.5 10*9/L    Absolute Basophils 0.1 0.0 - 0.1 10*9/L     *Note: Due to a large number of results and/or encounters for the requested time period, some results have not been displayed. A complete set of results can be found in Results Review.     RADIOLOGY RESULTS   None today.     CARE TEAM     [place] [facility] Hematology: Dr. Renae Fickle     [facility] Surgical Oncology: Dr. Tora Duck     FOLLOW UP     Future Appointments   Date Time Provider Department Center   06/11/2023 12:30 PM HB LAB [facility] [address] [facility]   06/11/2023  1:30 PM HBR CT RM 2 [facility] [place] [facility]   06/13/2023  9:00 AM [name], MD UNCDIABENDET [facility]   06/14/2023 10:00 AM Jeannine Boga, MD ONCMULTI [facility]   06/14/2023 11:00 AM Marion Downer, MD SURONC [facility]

## 2023-05-24 NOTE — Unmapped (Signed)
Called and spoke to Mr. Wickman. Confirmed appointment today at 1:30 pm with Dr. Timoteo Gaul (Labs to be drawn in clinic).

## 2023-06-10 NOTE — Unmapped (Signed)
Attempted to call patient to remind him of lab and CT on 9/17. Call went straight to VM and VM box full.

## 2023-06-11 ENCOUNTER — Ambulatory Visit: Admit: 2023-06-11 | Discharge: 2023-06-12 | Payer: MEDICARE

## 2023-06-11 MED ADMIN — iohexol (OMNIPAQUE) 350 mg iodine/mL solution 100 mL: 100 mL | INTRAVENOUS | @ 19:00:00 | Stop: 2023-06-11

## 2023-06-11 MED ADMIN — heparin, porcine (PF) 100 unit/mL injection 500 Units: 500 [IU] | INTRAVENOUS | @ 19:00:00

## 2023-06-13 ENCOUNTER — Ambulatory Visit: Admit: 2023-06-13 | Discharge: 2023-06-14 | Payer: MEDICARE | Attending: "Endocrinology | Primary: "Endocrinology

## 2023-06-13 DIAGNOSIS — E1165 Type 2 diabetes mellitus with hyperglycemia: Principal | ICD-10-CM

## 2023-06-13 MED ORDER — PEN NEEDLE, DIABETIC 32 GAUGE X 5/32" (4 MM)
3 refills | 0 days | Status: CP
Start: 2023-06-13 — End: ?

## 2023-06-13 MED ORDER — EMPAGLIFLOZIN 10 MG TABLET
ORAL_TABLET | Freq: Every day | ORAL | 3 refills | 90 days | Status: CP
Start: 2023-06-13 — End: ?

## 2023-06-13 MED ORDER — BLOOD-GLUCOSE METER KIT WRAPPER
Freq: Once | 0 refills | 0 days | Status: CP
Start: 2023-06-13 — End: 2023-06-13

## 2023-06-13 MED ORDER — INSULIN GLARGINE (U-100) 100 UNIT/ML (3 ML) SUBCUTANEOUS PEN
Freq: Every evening | SUBCUTANEOUS | 3 refills | 66 days | Status: CP
Start: 2023-06-13 — End: 2023-07-13

## 2023-06-13 MED ORDER — BLOOD GLUCOSE TEST STRIPS
ORAL_STRIP | 3 refills | 0 days | Status: CP
Start: 2023-06-13 — End: ?

## 2023-06-13 MED ORDER — GLUCAGON 3 MG/ACTUATION NASAL SPRAY
3 refills | 0 days | Status: CP
Start: 2023-06-13 — End: ?

## 2023-06-13 MED ORDER — LANCETS
12 refills | 0 days | Status: CP
Start: 2023-06-13 — End: ?

## 2023-06-13 NOTE — Unmapped (Signed)
No meter and pump downloaded today.     POC glucose  done today.     PP 7:15 am     BS 145 mg/dL.    12/27/2022  RESULT: 8.6

## 2023-06-13 NOTE — Unmapped (Signed)
Assessment/Plan:   T2DM. Diabetes is uncontrolled. A1C is above goal of less than 7.5%. We discussed the pathophysiology of T2DM and the increased risk of microvascular and macrovascular complications with chronic hyperglycemia. He defers CDE referral for comprehensive DM education. No glucose data to review today.   Plan:     Continue Lantus 30 units nightly and Jardiance 10 mg daily.   Check blood sugar at least once a day.   Refills sent today.   I sent a prescription for glucagon nasal spray for emergency use only when sugar is very low and you are not alert enough to ingest sugar.   Lab orders placed. Please do these with your next set of labs. I will send you a copy of your test results when they are available.     - Lipid: check. Not on statin. Medication averse.   - Blood pressure: well controlled on current regimen.   - Renal: CKD III  - Eye exam: Referral placed. Last exam was 11/2018 no DROU.   - Neuro: monofilament exam at follow up.   - Glucagon: Rx sent.         All questions were answered and patient agrees with plan.     Return in about 3 months (around 09/12/2023).     Thank you for referring your patient to our endocrine clinic for evaluation. Please do not hesitate to contact me with any questions.     [name], MD  [facility] at [facility]  Phone: [phone]   Fax: [phone]    This note was completed with the assistance of voice recognition software.  Please excuse grammatical errors.      CC: Type 2 DM.    PCP: Gae Bon, MD    Referring provider: Philippa Chester Herfarth    Subjective:      Keith Ballard is a 67 y.o. male with history of orthostatic hypotension, OSA, ulcerative colitis c/b high grade dysplasia s/p total colectomy 1996, gallbladder cancer s/p surgery and chemotherapy, high grade B cell lymphoma s/p CHOP, PTSD who is seen at the request of Philippa Chester Herfarth in consultation for evaluation of T2DM. History was obtained from the patient and review of the electronic medical record.    Diagnosed: 2008 while in [place]. In 2019, C-peptide was 10, GAD 65 negative confirming T2DM. His gastroenterologist was interested in considering GLP-1 agent for his DM. Has chronic nausea and chronic abd pain and was told this is likely due to his malignancies/surgeries. Has low energy.     Complications:  Retinopathy: no, last exam 3 years ago.   Neuropathy: yes in feet and fingers.   Nephropathy: CKD III  Hypoglycemia: 2-3 weeks ago. Lightheaded, dizzy, feels he may fall.   Hospitalization: no  CVD/CVA: no    Previous treatments:   Glipizide  Victoza - sleepy, groggy    Current diabetes regimen:  Jardiance 10 mg daily  Lantus 30 units qhs    Injection sites: abd  Rotating injection sites: yes    Checks fingersticks 0-1 daily. Does not check often.   Per recall: in 100s.     Diet: has 20% of his colon. Has j pouch in place. Has dessert occasionally at night. He does ensure he has protein.   BK: 6 oz of  stew with veggies, potato, beans.     Exercise: walks occasionally.       Lab Results   Component Value Date    A1C 8.6 (H)  12/27/2022    A1C >14.0 (H) 06/15/2021    A1C 5.6 07/27/2020    A1C 5.3 04/27/2020    A1C 7.2 (H) 11/26/2018       BMI Readings from Last 5 Encounters:   06/13/23 (P) 23.82 kg/m??   05/24/23 23.59 kg/m??   04/11/23 24.61 kg/m??   03/22/23 23.93 kg/m??   02/28/23 24.35 kg/m??       Wt Readings from Last 6 Encounters:   06/13/23 75.3 kg (166 lb)   05/24/23 74.6 kg (164 lb 6.4 oz)   04/11/23 77.8 kg (171 lb 8 oz)   03/22/23 75.7 kg (166 lb 12.8 oz)   02/28/23 77 kg (169 lb 11.2 oz)   02/14/23 75.3 kg (166 lb)       Patient Active Problem List   Diagnosis    Obstructive sleep apnea    Posterior subcapsular polar senile cataract    Intestinal bypass or anastomosis status    Senile nuclear sclerosis    UC (ulcerative colitis) (CMS-HCC)    Vocal process granuloma    Vocal fold scar    Periodic limb movement disorder (PLMD)    Pouchitis (CMS-HCC) Panic attack    Onychomycosis    Type 2 diabetes mellitus, with long-term current use of insulin (CMS-HCC)    Vasculogenic erectile dysfunction    TBI (traumatic brain injury) (CMS-HCC)    Right knee pain    PTSD (post-traumatic stress disorder)    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)    Breathing problem    Diarrhea    Syncope    COVID-19    Hoarseness or changing voice    Anxiety    Insomnia    Risk for falls    Gallbladder cancer (CMS-HCC)    High risk medication use    Orthostatic hypotension       Past Medical History:   Diagnosis Date    Acute kidney failure (CMS-HCC) 05/24/2020    Anxiety     Arthritis     Cataract     Colon cancer (CMS-HCC)     s/p colectomy    Depression     Essential hypertension 09/04/2016    GERD (gastroesophageal reflux disease)     Left hip pain 09/12/2015    Peptic ulceration     PTSD (post-traumatic stress disorder)     Risk for falls 01/15/2023    Sepsis (CMS-HCC) 06/12/2020    Sleep apnea     Sleep apnea 2015    Strep throat     Type 2 diabetes mellitus without complication (CMS-HCC) 08/02/2015    UC (ulcerative colitis) (CMS-HCC)        Current Outpatient Medications   Medication Sig Dispense Refill    acetaminophen (TYLENOL) 500 MG tablet Take 2 tablets (1,000 mg total) by mouth every eight (8) hours. 90 tablet 0    acetaminophen (TYLENOL) 500 MG tablet Take 2 tablets (1,000 mg total) by mouth every eight (8) hours. 90 tablet 0    capecitabine (XELODA) 500 MG tablet Take 2 tablets (1000 mg) by mouth in the morning and 3 tablets (1500 mg) in the evening for 14 days. Only take on days 1 through 14 of each 21-day cycle. Take with full glass of water, within 30 minutes after a meal. Swallow tablets whole; do not cut or crush. 70 tablet 3    diphenoxylate-atropine (LOMOTIL) 2.5-0.025 mg per tablet Take 2 tablets by mouth four (4) times a day. 720 tablet  0    enoxaparin (LOVENOX) 40 mg/0.4 mL Syrg Inject 0.4 mL (40 mg total) under the skin daily. 11.2 mL 0    hydrOXYzine (ATARAX) 25 MG tablet Take one or two tablets at bedtime as needed for anxiety or sleep 60 tablet 11    hydrOXYzine (ATARAX) 25 MG tablet Take 1-2 tablets (25-50 mg total) by mouth at bedtime as needed for anxiety and sleep. 60 tablet 11    lidocaine 4 % patch Place 1 patch on the skin daily as needed (pain). Apply for 12 hours then remove for 12 hours. 15 patch 0    loperamide (IMODIUM A-D) 2 mg tablet Take 2 tablets (4 mg) by mouth for first loose stool followed by 1 tablet (2 mg) after each loose stool thereafter (maximum of 8 tablets per day). 30 tablet 11    loperamide (IMODIUM) 2 mg capsule Take 2 capsules by mouth for first loose stool, followed by 1 capsule after each loose stool thereafter, Max of 8 capsules daily. 30 capsule 11    midodrine (PROAMATINE) 5 MG tablet Take 2 tablets (10 mg total) by mouth in the morning.      omeprazole (PRILOSEC) 40 MG capsule       ondansetron (ZOFRAN) 8 MG tablet Take 1 tablet (8 mg total) by mouth every eight (8) hours as needed for nausea. 60 tablet 3    prochlorperazine (COMPAZINE) 10 MG tablet Take 1 tablet (10 mg total) by mouth every six (6) hours as needed (Nausea/Vomiting). 60 tablet 3    QUEtiapine (SEROQUEL) 100 MG tablet Take 1 tablet (100 mg total) by mouth nightly as needed (sleep and anxiety). 90 tablet 3    QUEtiapine (SEROQUEL) 100 MG tablet Take 1 tablet (100 mg total) by mouth at night as needed (sleep and anxiety). 90 tablet 3    venlafaxine (EFFEXOR-XR) 150 MG 24 hr capsule Take 1 capsule (150 mg total) by mouth daily. 90 capsule 3    blood sugar diagnostic (GLUCOSE BLOOD) Strp Ok to sub any brand preferred by insurance/patient to match with meter, use 3x/day, dx E11.65 300 strip 3    blood-glucose meter kit by Other route once for 1 dose. Use as instructed. Okay to dispense brand covered by insurance and preferred by patient. E11.65 1 each 0    empagliflozin (JARDIANCE) 10 mg tablet Take 1 tablet (10 mg total) by mouth daily. 90 tablet 3    insulin glargine (BASAGLAR, LANTUS) 100 unit/mL (3 mL) injection pen Inject 0.3 mL (30 Units total) under the skin nightly. 20 mL 3    lancets Misc Dispense 100 lancets, ok to sub any brand preferred by insurance/patient to match lancing device, use 1x/day, dx E11.65 100 each 12    pen needle, diabetic 32 gauge x 5/32 (4 mm) Ndle Use with insulin daily 100 each 3     No current facility-administered medications for this visit.       Allergies   Allergen Reactions    Sulfa (Sulfonamide Antibiotics) Hives and Rash       Family History   Problem Relation Age of Onset    Arthritis Mother     Arthritis Father     Depression Father     Vision loss Father     Glaucoma Father     Inflammatory bowel disease Father     Depression Sister     Liver disease Sister     Inflammatory bowel disease Sister  PSC    Depression Brother     Inflammatory bowel disease Brother     Arthritis Maternal Grandfather     Cancer Maternal Grandfather     Cancer Paternal Grandfather    Brothers with T2DM. His sister is an Charity fundraiser.     Social History     Tobacco Use    Smoking status: Never    Smokeless tobacco: Never   Vaping Use    Vaping status: Never Used   Substance Use Topics    Alcohol use: No     Alcohol/week: 0.0 standard drinks of alcohol    Drug use: No   Married and lives with his wife. He is an Chartered loss adjuster.     Review of Systems  Remainder of 12-point review of systems was negative except for pertinent items noted in the HPI.     Objective:     PHYSICAL EXAM:   BP 91/57  - Pulse 96  - Temp 36.2 ??C (97.2 ??F) (Skin)  - Wt 75.3 kg (166 lb)  - BMI 23.82 kg/m??   GEN: appears well      Lab Review  HGB A1C, POC (%)   Date Value   11/26/2018 7.2 (H)   10/07/2018 8.5 (H)   05/27/2018 8.5 (H)   11/25/2017 11.9 (H)   05/22/2017 6.5     Hemoglobin A1C (%)   Date Value   12/27/2022 8.6 (H)   06/15/2021 >14.0 (H)   07/27/2020 5.6   04/27/2020 5.3     LDL Direct (mg/dL)   Date Value   16/06/9603 <30.0 (L)     Albumin/Creatinine Ratio (no units)   Date Value   11/26/2018      Comment:     Unable to calculate due to value below lower limit of assay linearity.     eGFR CKD-EPI (2021) Male (mL/min/1.60m2)   Date Value   06/11/2023 38 (L)       Lab Results   Component Value Date    NA 135 04/11/2023    K 3.9 04/11/2023    CL 105 04/11/2023    CO2 23.0 04/11/2023    BUN 21 04/11/2023    CREATININE 1.9 (H) 06/11/2023    GFRNONAA >60 11/25/2017    GFRAA >60 11/25/2017    CALCIUM 9.2 04/11/2023    ALBUMIN 3.6 04/11/2023    PHOS 3.0 12/29/2022       Lab Results   Component Value Date    TSH 2.030 08/13/2015

## 2023-06-13 NOTE — Unmapped (Addendum)
It was a pleasure seeing you today at our [facility] Endocrinology clinic.   If any testing was ordered today, the results will most likely appear in your mychart account even before I see them. Once all your results are available, I will contact you within 1-2 weeks with an explanation. Please contact me if you do not hear from me by then.   Please make sure you have access to mychart since this is how I will communicate with you.  If you do not have mychart, I will send you a letter which may take 1-2 weeks to arrive.      Remember to call us or schedule an appointment with any urgent issues. An appointment is preferred if a discussion is needed.   Please allow a few days for mychart messages to be read and answered for non-urgent matters.     If you are seen for diabetes, please bring your glucometer and all devices to every visit.   All refill requests must be submitted 14 days in advance to allow enough time to be processed.     Continue Lantus 30 units nightly and Jardiance 10 mg daily.   Check blood sugar at least once a day.   Refills sent today.   I sent a prescription for glucagon nasal spray for emergency use only when sugar is very low and you are not alert enough to ingest sugar.   Lab orders placed. Please do these with your next set of labs. I will send you a copy of your test results when they are available.

## 2023-06-14 ENCOUNTER — Ambulatory Visit: Admit: 2023-06-14 | Discharge: 2023-06-15 | Payer: MEDICARE

## 2023-06-14 DIAGNOSIS — C23 Malignant neoplasm of gallbladder: Principal | ICD-10-CM

## 2023-06-14 LAB — COMPREHENSIVE METABOLIC PANEL
ALBUMIN: 3.4 g/dL (ref 3.4–5.0)
ALKALINE PHOSPHATASE: 186 U/L — ABNORMAL HIGH (ref 46–116)
ALT (SGPT): 14 U/L (ref 10–49)
ANION GAP: 7 mmol/L (ref 5–14)
AST (SGOT): 24 U/L (ref <=34)
BILIRUBIN TOTAL: 1.1 mg/dL (ref 0.3–1.2)
BLOOD UREA NITROGEN: 18 mg/dL (ref 9–23)
BUN / CREAT RATIO: 12
CALCIUM: 8.9 mg/dL (ref 8.7–10.4)
CHLORIDE: 108 mmol/L — ABNORMAL HIGH (ref 98–107)
CO2: 23 mmol/L (ref 20.0–31.0)
CREATININE: 1.56 mg/dL — ABNORMAL HIGH
EGFR CKD-EPI (2021) MALE: 48 mL/min/{1.73_m2} — ABNORMAL LOW (ref >=60)
GLUCOSE RANDOM: 136 mg/dL (ref 70–179)
POTASSIUM: 3.9 mmol/L (ref 3.5–5.1)
PROTEIN TOTAL: 6.3 g/dL (ref 5.7–8.2)
SODIUM: 138 mmol/L (ref 135–145)

## 2023-06-14 LAB — CBC W/ AUTO DIFF
BASOPHILS ABSOLUTE COUNT: 0 10*9/L (ref 0.0–0.1)
BASOPHILS RELATIVE PERCENT: 0.9 %
EOSINOPHILS ABSOLUTE COUNT: 0.2 10*9/L (ref 0.0–0.5)
EOSINOPHILS RELATIVE PERCENT: 4.5 %
HEMATOCRIT: 32.7 % — ABNORMAL LOW (ref 39.0–48.0)
HEMOGLOBIN: 11.5 g/dL — ABNORMAL LOW (ref 12.9–16.5)
LYMPHOCYTES ABSOLUTE COUNT: 0.8 10*9/L — ABNORMAL LOW (ref 1.1–3.6)
LYMPHOCYTES RELATIVE PERCENT: 16.7 %
MEAN CORPUSCULAR HEMOGLOBIN CONC: 35.3 g/dL (ref 32.0–36.0)
MEAN CORPUSCULAR HEMOGLOBIN: 31.9 pg (ref 25.9–32.4)
MEAN CORPUSCULAR VOLUME: 90.6 fL (ref 77.6–95.7)
MEAN PLATELET VOLUME: 7.4 fL (ref 6.8–10.7)
MONOCYTES ABSOLUTE COUNT: 0.5 10*9/L (ref 0.3–0.8)
MONOCYTES RELATIVE PERCENT: 9.6 %
NEUTROPHILS ABSOLUTE COUNT: 3.3 10*9/L (ref 1.8–7.8)
NEUTROPHILS RELATIVE PERCENT: 68.3 %
PLATELET COUNT: 146 10*9/L — ABNORMAL LOW (ref 150–450)
RED BLOOD CELL COUNT: 3.61 10*12/L — ABNORMAL LOW (ref 4.26–5.60)
RED CELL DISTRIBUTION WIDTH: 17.2 % — ABNORMAL HIGH (ref 12.2–15.2)
WBC ADJUSTED: 4.8 10*9/L (ref 3.6–11.2)

## 2023-06-14 LAB — CANCER ANTIGEN 19-9: CA 19-9: 14.75 U/mL (ref 0–35)

## 2023-06-14 NOTE — Unmapped (Unsigned)
Patient Name: Keith Ballard  Patient Age: 67 y.o.  Encounter Date: 06/14/2023    Attending Physician:  Tora Duck, MD, MPH      Primary Care Provider:  Gae Bon, MD    CONSULTING PHYSICIANS:  Patient Care Team:  Gae Bon, MD as PCP - General (Hematology)  Vaught, Ninetta Lights, MD as PCP - Rosalia Hammers, Scarlette Slice, MD as Consulting Physician (Hematology and Oncology)  Alford Highland, RN as Registered Nurse (Oncology Navigator)  Marion Downer, MD as Surgeon (Surgical Oncology)  Jill Poling, Berline Chough, NP as Nurse Practitioner (Surgical Oncology)  Ward Givens, PMHNP as Nurse Practitioner (Psychiatry)  Jeannine Boga, MD (Medical Oncology)  Valeria Batman, RN as Registered Nurse    Diagnosis:  Gallbladder Adenocarcinoma, pT2aN0    Follow Up Note:    Keith Ballard is a 67 y.o. 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 His gallbladder cancer was incidentally found on cholecystectomy specimen now s/p portal lymphadenopathy and segment 4B/5 partial hepatectomy on 12/26/22 with no residual disease in the liver and 0/10 lymph nodes. Patient being followed by Dr. Timoteo Gaul with medical oncology now s/p three cycles capecitabine discontinued 8/30 due to concerns for intolerance and recurrent falls. She plans to see him again in three months and follow closely without plans for further chemotherapy.     He was accompanied by his wife at today's visit and an interpreter used as she is spanish-speaking only. He denies fevers, shaking chills or night sweats. He states he has resumed a normal diet without n/v/d/c. His incision is well healed with minor area of paresthesia over the incision. His wife noted a recent fall approximately two weeks ago where he fell on top of her. He was on the ground for about 5-10 minutes and she had to see her PCP due to an injury sustained when he fell on to her.  His wife again endorses episodes where he becomes rigid and stops breathing for a minute or two and she has to help him to the ground. He is being followed by endocrine, but does not routinely check his glucose around these episodes and is often unaware of the episodes. He expresses need for assistance in finding a new primary care who can monitor him more closely. Another in-house referral to internal med/family med will be placed today.     Vital Signs for this encounter:  BSA: 1.95 meters squared  Ht 177.8 cm (5' 10)  - Wt 77.2 kg (170 lb 3.2 oz)  - BMI 24.42 kg/m??   Wt Readings from Last 3 Encounters:   06/14/23 77.2 kg (170 lb 3.2 oz)   06/14/23 76.7 kg (169 lb 1.6 oz)   06/13/23 75.3 kg (166 lb)       General Appearance:  No acute distress, well appearing and well nourished. A&0 x 3.   Eyes:  Conjuctiva and lids appear normal. Pupils equal and round, sclera anicteric.   Pulmonary:    Normal respiratory effort.  Lungs were clear to auscultation bilaterally.   Cardiovascular:  Regular rate and rhythm, no murmur noted.  No thrill noted.   Abdomen:   Soft, non-tender, without masses.  No hepatosplenomegaly. No hernias appreciated. Normoactive bowel sounds.   Musculoskeletal: Normal gait.  Extremities without clubbing, cyanosis, or edema.  Neurologic:  Lymphatic: No motor abnormalities noted.  Sensation grossly intact.  No cervical or supraclavicular LAD noted.       Diagnostic Studies:  11/13/22  Addendum   Special stains were performed on part D.  Trichrome and reticulin stains highlight portal tract expansion by fibrosis with mild periportal fibrosis (Ludwig-Batts stage 2 of 4).  Focal periductal fibrosis is seen in larger portal tracts.  Hypertrophic arterioles are present.  No evidence of conspicuous ductopenia or fibro-obliterative scars is seen.  Rhodanine stain highlights mild, focal copper accumulation in rare periseptal hepatocytes, suggestive of chronic biliary tract disease.  Iron stain is negative.  PASD stain shows no additional pathologic changes.  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 The differential diagnosis includes secondary sclerosing cholangitis  with possible etiologies including chronic biliary obstruction, drug/toxin effect, infection and ischemic injury.      Addendum electronically signed by Elesa Massed, MD on 11/22/2022 at 1925   Diagnosis   A: Liver, segment 5, biopsy:  - Fibrovascular tissue with necrosis, admixed bile, fibrosis and focal cluster of atypical epithelioid cells with crush artifact.  - No definite evidence of carcinoma.     B: Liver, segment 5, biopsy:  - Fibrovascular tissue with chronic inflammation, rare bile ducts, and dense fibrosis lining area of necrosis with microcalcifications and admixed bile.  - Negative for carcinoma.     C: Gallbladder, cholecystectomy  - Adenocarcinoma, moderately to poorly differentiated, with micropapillary features, 1.5 cm in size, invading through the muscularis propria into the peribiliary connective tissue.  - Lymphovascular and perineural invasion is identified.  - Cystic duct and liver bed margins are negative for carcinoma.  - Uninvolved gallbladder with acute and chronic cholecystitis and cholelithiasis.     D: Liver, segment 4, biopsy:  - Fragments of liver with minimal macrovesicular steatosis, hepatocyte nuclear glycogenation and several larger portal tracts with vascular hyalinization (status post chemotherapy).  - Negative for carcinoma.        This electronic signature is attestation that the pathologist personally reviewed the submitted material(s) and the final diagnosis reflects that evaluation.   Amendment electronically signed by Elesa Massed, MD on 11/19/2022 at [phone]  Electronically signed by Elesa Massed, MD on 11/19/2022 at 0405   Synoptic Report   GALLBLADDER   8th Edition - Protocol posted: 6/30/2021GALLBLADDER - C       SPECIMEN   Procedure   Simple cholecystectomy   TUMOR   Tumor Site   Body   Histologic Type   Adenocarcinoma, not otherwise specified (NOS)   Histologic Type Comment   Focal micropapillary features   Histologic Grade   G3, poorly differentiated   Tumor Size   Greatest Dimension (Centimeters): 1.5 cm   Tumor Extent   Invades perimuscular connective tissue on the hepatic side without liver involvement   Lymphovascular Invasion   Present   Perineural Invasion   Present   MARGINS   Margin Status for Invasive Carcinoma   All margins negative for invasive carcinoma   Distance from Invasive Carcinoma to Cystic Duct Margin   3.5 cm   Distance from Invasive Carcinoma to Liver Parenchymal Margin   0.1 cm   Margin Status for Intraepithelial Neoplasia   All margins negative for high-grade intraepithelial neoplasia   REGIONAL LYMPH NODES   Regional Lymph Node Status   Not applicable (no regional lymph nodes submitted or found)   PATHOLOGIC  STAGE CLASSIFICATION (pTNM, AJCC 8th Edition)   Reporting of pT, pN, and (when applicable) pM categories is based on information available to the pathologist at the time the report is issued. As per the AJCC (Chapter 1, 8th Ed.) it is the managing physician???s responsibility to establish the final pathologic stage based upon all pertinent information, including but potentially not limited to this pathology report.   pT Category   pT2b   pN Category   pN not assigned (no nodes submitted or found)   ADDITIONAL FINDINGS   Additional Findings   Cholelithiasis       Chronic cholecystitis   SPECIAL STUDIES   Ancillary Studies   Not performed   .         12/26/22 Pathology  Diagnosis   A: Abdominal scar, resection  - Skin, subcutaneous tissue, and skeletal muscle with deep ulcer, dense mixed inflammation, fat necrosis, fibrosis and foreign body giant cell reaction, consistent with surgical site changes  - Negative for carcinoma     B: Lymph node, portal, lymphadenectomy  - Ten lymph nodes, negative for malignancy (0/10)  - Fragments of benign biliary tissue     C: Liver, segment 4B/5, resection  - Benign liver parenchyma with capsular foreign body giant cell reaction, consistent with prior procedure site  - Negative for carcinoma       Assessment: Keith Ballard is a 67 y.o. male who is seen in followup s/p cholecystectomy 11/13/22 and then liver resection with portal lymphadenectomy on 12/26/22 for a pT2bN0M0 adenocarcinoma of the gall bladder, clinically NED. Now s/p 3 cycles of capecitabine managed by Dr. Timoteo Gaul. Chemo discontinued due to intolerance/recurrent falls with plan to monitor closely with surveillance imaging. No future plans for resuming chemotherapy at this time. Patient also to benefit from seeing a PCP to further work up dizziness/falls. Will make a second referral to [facility] PCP and patient agreeable to make an appointment this time. Plan to RTC in 4 months with imaging and labs.    Plan: He was seen and examined today.   -Labs ordered and drawn including CA 19-9  -RTC in 4 months (~January)  -Referral placed for PCP   -Follow up with GI med on for ongoing surveillance   -Recommend following recommendations from Diabetes clinic    Our contact information was given for any further questions or concerns.    Tora Duck, MD, MPH was present for the evaluation and is in agreement with the plan above.

## 2023-06-14 NOTE — Unmapped (Signed)
Labs collected via venipuncture with 23G needle. Patient tolerated well. Labs sent to core lab via tubing system.

## 2023-06-14 NOTE — Unmapped (Signed)
[facility] MED[facility] ONCOLOGY RETURN VISIT        PATIENT IDENTIFICATION   Keith Ballard is a 67 y.o. 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      CANCER STAGING     Cancer Staging   Gallbladder cancer (CMS-HCC)  Staging form: Gallbladder, AJCC 8th Edition  - Pathologic: Stage IIB (pT2b, pN0, cM0) - Signed by Jeannine Boga, MD on 01/31/2023      ONCOLOGIC HISTORY     Gallbladder adenocarcinoma, pT2bN0  07/26/22, OSH CT for lymphoma treatment response assessment incidentally revealed multiple areas of soft tissue nodularity along the gallbladder wall the largest measuring up to 1.2 cm near the fundus.  08/30/22, [place] RUQ gallbladder showed indeterminant polypoid lesion with internal vascularity arising from the wall of the gallbladder.   10/15/22, MRI abdomen enhancing intraluminal gallbladder lesions measuring 1.8 and 0.6 cm. Detailed anatomy is limited due to motion artifact. However grossly, there is no appreciable wall thickening or evidence of invasion. Gallbladder polyps/ primary gallbladder neoplasm is favored over lymphoma.  10/26/22, CA 19-9=15.3  11/13/22, cholecystectomy and liver biopsy; Pathology showed a 1.5 cm moderately to poorly differentiated adenocarcinoma with micropapillary features invading through the muscularis propria into the peribiliary connective tissue, cystic duct and liver bed margins are negative, +LVI, +PNI. Segment 5 biopsy (prior lymphoma site) negative for carcinoma. Segment 4 biopsy showed features of periductal concentric fibrosis and focal copper accumulation and minimal macrovascular steatosis, negative for carcinoma.  12/26/22, portal lymphadenectomy and segment 4B/5 partial hepatectomy; Final path negative for carcinoma.  01/31/23, initial consultation at [facility] Med Onc  02/08/23, DPYD phenotype normal.  02/26/23, initiated on adjuvant capecitabine; CT CAP showed no evidence of recurrence/metastasis  03/22/23, C2 adjuvant capecitabine at 1500 mg twice daily on days 1-14   04/11/23, C3 adjuvant capecitabine (delayed due to weakness and diarrhea). Capecitabine dose reduced to 1000 mg qam and 1500 mg qpm on days 1-14  05/24/23, capecitabine discontinued  06/11/23, CT CAP showed no evidence of recurrence/metastasis. CA 19-9 pending    High grade B cell lymphoma involving liver and spleen  S/p R-CHOP last dose on 10/06/20 subsequent PET on 03/16/21 showed complete remission.    Colon cancer s/p resection in 1996      MOLECULAR DIAGNOSTICS     N/A    ASSESSMENT AND PLAN      # Gallbladder adenocarcinoma, pT2bN0  Incidentally identified on cholecystectomy specimen now s/p portal lymphadenectomy and segment 4B/5 partial hepatectomy on 12/26/22.     Mr. Keith Ballard has missed several of his follow up appointments with [place]. Neither he nor his wife remembers how many capecitabine tablets he is taking and for how many days, despite close follow up and education multiple times by [place] and our pharmacist. Meanwhile, he continues to have frequent falls which he has been experiencing prior to starting capecitabine. Given the above, I do not think it is safe for him to continue capecitabine. I recommended to stop taking capecitabine and we will proceed with surveillance from now on.     His CT today showed no evidence of recurrence/metastasis. CA 19-9 pending    PLAN  - RTC in 3 months for labs and CT with video follow up after    # Dyspepsia  # Loose stools  Chronic, symptoms fluctuate.   - Advised patient to schedule follow up with Dr. Gwenith Spitz    # Dizziness/Lightheadedness: likely  due to orthostatic hypotension. Chronic, onset prior to starting capecitabine.   - Advised patient to stay hydrated  - Fall precautions  - Consider brain imaging if symptoms worsens.         Orders Placed This Encounter   Procedures    CT Chest Wo Contrast    CT Abdomen Pelvis W Contrast    CBC w/ Differential Comprehensive Metabolic Panel    Cancer Antigen 19-9       The natural history of cancer was reviewed and discussed with the patient and family members. Treatment options as well as risks and benefits were discussed with the patient/family. Patient and family member's questions answered to their satisfaction    I spent a total of 40 minutes in both face-to-face and non-face-to-face activities for this visit on the date of this encounter.        INTERVAL HISTORY   Keith Ballard is here for follow up. He complains that My stomach is bothering me a lot. It's a combination of pain and being nauseated.  No vomiting. Symptoms has been going on for the last three years, and it seems to be worse the last few months. Stool is dark and loose. His stool has been loose since his colon cancer surgery back in 1996. Sometimes it is better sometimes it is worse. He states that he likes his gastroenterologist Dr. Gwenith Spitz but it can be challenging to come to [facility] to see him. I suggested to make a referral for him to see a local gastroenterologist he declined. No other complains.      REVIEW OF SYSTEMS   The remainder of a comprehensive 10 systems review is negative.     Allergies and Medications reviewed as per chart    PHYSICAL EXAMS     Vitals:    06/14/23 1005   BP: 125/70   Pulse: 98   Resp: 16   Temp: 36.5 ??C (97.7 ??F)   SpO2: 100%               06/14/23 77.2 kg (170 lb 3.2 oz)   06/14/23 76.7 kg (169 lb 1.6 oz)   06/13/23 75.3 kg (166 lb)      ECOG: (1) Restricted in physically strenuous activity, ambulatory and able to do work of light nature    GENERAL: chronically ill appearing, in no distress  PSYCH: full and appropriate range of affect with good insight and judgement  HEENT: NCAT, pupils equal, sclerae anicteric   EXT: warm and well perfused, no edema  SKIN: No rashes       OBJECTIVE DATA   LABS:   Results for orders placed or performed in visit on 06/13/23   POCT Glucose   Result Value Ref Range    Glucose, POC 145 70 - 179 mg/dL     *Note: Due to a large number of results and/or encounters for the requested time period, some results have not been displayed. A complete set of results can be found in Results Review.     RADIOLOGY RESULTS   CT Abdomen Pelvis W Contrast  Narrative: EXAM: CT ABDOMEN PELVIS W CONTRAST  ACCESSION: [id number] UN  CLINICAL INDICATION: 67 years old with gallbladder cancer surveillance  - C23 - Gallbladder cancer (CMS - HCC)      COMPARISON: 02/28/2023 CT abdomen pelvis and prior    TECHNIQUE: A helical CT scan of the abdomen and pelvis was obtained following IV contrast from the lung bases through the pubic symphysis. Images  were reconstructed in the axial plane. Coronal and sagittal reformatted images were also provided for further evaluation.     FINDINGS:    LOWER CHEST: Please see dedicated CT chest for characterization of findings above the diaphragm.    LIVER: Status post partial 4B/5 segmentectomy. Normal liver contour. Stable hypoattenuating lesions in the hepatic segments 5 (6:28) and 8 (6:8).    BILIARY: The gallbladder is surgically absent. No biliary ductal dilatation.      SPLEEN: Normal in size and contour.     PANCREAS: Atrophic. No focal lesions.  No ductal dilation.    ADRENAL GLANDS: Normal appearance of the adrenal glands.    KIDNEYS/URETERS: Subcentimeter hypoattenuating lesion in the right upper pole that is too small to characterize. Symmetric renal enhancement.  No hydronephrosis.  No solid renal mass.    BLADDER: Partially contracted.    REPRODUCTIVE ORGANS: Unremarkable.    GI TRACT: Sequelae of total colectomy. No findings of bowel obstruction or acute inflammation.     PERITONEUM, RETROPERITONEUM AND MESENTERY: No free air.  No ascites.  No fluid collection.    LYMPH NODES: Stable subcentimeter celiac axis and retroperitoneal nodes. No adenopathy by size criteria.    VESSELS: Scattered atherosclerotic calcifications of the aorta and its branches. Hepatic and portal veins are patent.  Normal caliber aorta.      BONES and SOFT TISSUES: Grade 1 anterolisthesis of L4 on L5. Multilevel chronic degenerative changes. Post-operative changes of the ventral abdominal wall.    Impression: Since 02/28/2023:    - No evidence of recurrent or new metastatic disease in the abdomen or pelvis.  - Additional chronic or incidental findings as described above in the body of the report.  CT Chest Wo Contrast  Narrative: EXAM: CT CHEST WO CONTRAST  ACCESSION: [id number] UN    CLINICAL INDICATION: Gallbladder cancer surveillance  - C23 - Gallbladder cancer (CMS - HCC)     TECHNIQUE: Contiguous noncontrast axial images were reconstructed through the chest following a single breath hold helical acquisition. Images were reformatted in the axial. coronal, and sagittal planes. MIP slabs were also constructed.    COMPARISON: CT chest 02/28/2023    FINDINGS:    LUNGS, AIRWAYS, AND PLEURA: Trachea is patent. There is no focal consolidation or pulmonary edema. Bibasilar dependent atelectasis present. There is unchanged scarring of right upper lobe. There is subsegmental atelectasis in the lingula. Scattered 2-3 mm bilateral pulmonary nodules are unchanged. No new or enlarging pulmonary nodules. Central airways are patent. No pleural effusion or pneumothorax.    MEDIASTINUM AND LYMPH NODES: No enlarged intrathoracic, axillary, or supraclavicular lymph nodes. Small sliding type hiatal hernia.    HEART AND VASCULATURE: Cardiac chambers are normal in size. Mild coronary artery calcifications. No pericardial effusion. Aorta is normal in caliber. Main pulmonary artery is normal in size.     CHEST WALL AND BONES: Degenerative changes are present. No suspicious lytic or sclerotic lesions. There are surgical changes of anterior cervical spine fusion. Mild bilateral symmetric gynecomastia is present.    UPPER ABDOMEN: Reported separately.    OTHER: Right internal jugular port catheter tip is at the level of the superior cavoatrial junction.  Impression: No specific evidence of thoracic metastases.    Scattered nonspecific micronodules are unchanged.       CARE TEAM     [facility]: Dr. Renae Fickle     [facility] Surgical Oncology: Dr. Tora Duck     FOLLOW UP     Future Appointments  Date Time Provider Department Center   08/09/2023 11:30 AM ADULT ONC LAB [facility]   08/09/2023 12:30 PM Laurice Record, FNP [id number] [facility]   09/11/2023 11:00 AM [facility] CT RM 1 [facility] [place] [facility]   09/13/2023 10:00 AM ADULT ONC LAB [facility]   09/13/2023 11:00 AM Jeannine Boga, MD ONCMULTI [facility]   10/15/2023  3:00 PM [name], MD [facility]END[facility]   10/18/2023  8:30 AM [facility] CT RM 4 [facility] [place]   10/18/2023  9:30 AM ADULT ONC LAB [facility]   10/18/2023 10:30 AM Marion Downer, MD SURONC [facility]

## 2023-06-14 NOTE — Unmapped (Signed)
Patient Keith Ballard was called in attempt number 1 to reach them regarding scheduling their upcoming appointments on 11/15.     The call resulted in: Voicemail full        Thank you,    Cephus Richer

## 2023-06-20 NOTE — Unmapped (Signed)
The [facility] has made a third and final attempt to reach this patient to refill the following medication:Capecitabine.      We have left voicemails on the following phone numbers: [phone], have been unable to leave messages on the following phone numbers: [phone], have sent a text message to the following phone numbers: [phone], and have sent a Mychart questionnaire..    Dates contacted: 9/5,16,26  Last scheduled delivery: 05/09/23    The patient may be at risk of non-compliance with this medication. The patient should call the [facility] at [phone]  Option 4, then Option 1: Oncology to refill medication.    [facility] Specialty Technician

## 2023-06-21 ENCOUNTER — Other Ambulatory Visit: Payer: Self-pay

## 2023-06-21 ENCOUNTER — Encounter: Payer: Self-pay | Admitting: Emergency Medicine

## 2023-06-21 DIAGNOSIS — M25512 Pain in left shoulder: Secondary | ICD-10-CM | POA: Insufficient documentation

## 2023-06-21 DIAGNOSIS — M25561 Pain in right knee: Secondary | ICD-10-CM | POA: Insufficient documentation

## 2023-06-21 DIAGNOSIS — Z981 Arthrodesis status: Secondary | ICD-10-CM | POA: Insufficient documentation

## 2023-06-21 DIAGNOSIS — M25511 Pain in right shoulder: Secondary | ICD-10-CM | POA: Diagnosis present

## 2023-06-21 DIAGNOSIS — Y92481 Parking lot as the place of occurrence of the external cause: Secondary | ICD-10-CM | POA: Diagnosis not present

## 2023-06-21 DIAGNOSIS — S0990XA Unspecified injury of head, initial encounter: Secondary | ICD-10-CM | POA: Diagnosis present

## 2023-06-21 DIAGNOSIS — M542 Cervicalgia: Secondary | ICD-10-CM | POA: Diagnosis present
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
## 2023-06-22 ENCOUNTER — Emergency Department: Payer: Medicare Other

## 2023-06-22 ENCOUNTER — Emergency Department
Admission: EM | Admit: 2023-06-22 | Discharge: 2023-06-22 | Disposition: A | Discharge status: Home / Self Care | Payer: Medicare Other | Attending: Emergency Medicine | Admitting: Emergency Medicine

## 2023-06-22 ENCOUNTER — Other Ambulatory Visit: Payer: Medicare Other

## 2023-06-22 DIAGNOSIS — M7918 Myalgia, other site: Secondary | ICD-10-CM
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
## 2023-06-22 NOTE — Discharge Instructions (Signed)

## 2023-06-22 NOTE — ED Notes (Signed)
Called pt several times no answer  
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
## 2023-06-22 NOTE — ED Notes (Signed)
ED Provider at bedside. 

## 2023-06-27 NOTE — Unmapped (Signed)
Specialty Medication(s): capecitabine    Mr.Gettis has been dis-enrolled from the [facility] specialty pharmacy services due to  prescription discontinued due to side effect toxicity .    Additional information provided to the patient: NA    Kermit Balo, [facility]  [facility] Specialty Pharmacist

## 2023-08-09 ENCOUNTER — Other Ambulatory Visit: Admit: 2023-08-09 | Discharge: 2023-08-09 | Payer: MEDICARE

## 2023-08-09 ENCOUNTER — Ambulatory Visit: Admit: 2023-08-09 | Discharge: 2023-08-09 | Payer: MEDICARE

## 2023-08-09 DIAGNOSIS — C8338 Diffuse large B-cell lymphoma, lymph nodes of multiple sites: Principal | ICD-10-CM

## 2023-08-09 DIAGNOSIS — C23 Malignant neoplasm of gallbladder: Principal | ICD-10-CM

## 2023-08-13 DIAGNOSIS — C23 Malignant neoplasm of gallbladder: Principal | ICD-10-CM

## 2023-09-10 DIAGNOSIS — C23 Malignant neoplasm of gallbladder: Principal | ICD-10-CM

## 2023-09-11 ENCOUNTER — Ambulatory Visit: Admit: 2023-09-11 | Discharge: 2023-09-12 | Payer: MEDICARE

## 2023-09-11 DIAGNOSIS — C23 Malignant neoplasm of gallbladder: Principal | ICD-10-CM

## 2023-09-13 ENCOUNTER — Ambulatory Visit: Admit: 2023-09-13 | Discharge: 2023-09-13 | Payer: MEDICARE

## 2023-09-13 ENCOUNTER — Other Ambulatory Visit: Admit: 2023-09-13 | Discharge: 2023-09-13 | Payer: MEDICARE

## 2023-09-13 DIAGNOSIS — C23 Malignant neoplasm of gallbladder: Principal | ICD-10-CM

## 2023-09-13 DIAGNOSIS — Z79899 Other long term (current) drug therapy: Principal | ICD-10-CM

## 2023-09-13 DIAGNOSIS — E1165 Type 2 diabetes mellitus with hyperglycemia: Principal | ICD-10-CM

## 2023-09-13 DIAGNOSIS — R296 Repeated falls: Principal | ICD-10-CM

## 2023-09-14 DIAGNOSIS — C23 Malignant neoplasm of gallbladder: Principal | ICD-10-CM

## 2023-09-20 DIAGNOSIS — C23 Malignant neoplasm of gallbladder: Principal | ICD-10-CM

## 2023-10-15 ENCOUNTER — Ambulatory Visit: Admit: 2023-10-15 | Discharge: 2023-10-16 | Payer: MEDICARE | Attending: "Endocrinology | Primary: "Endocrinology

## 2023-10-15 DIAGNOSIS — E1165 Type 2 diabetes mellitus with hyperglycemia: Principal | ICD-10-CM

## 2023-10-15 DIAGNOSIS — Z794 Long term (current) use of insulin: Principal | ICD-10-CM

## 2023-10-15 MED ORDER — SILDENAFIL 25 MG TABLET
ORAL_TABLET | Freq: Every day | ORAL | 2 refills | 10.00 days | Status: CP | PRN
Start: 2023-10-15 — End: 2023-11-14

## 2023-10-15 MED ORDER — METFORMIN 500 MG TABLET
ORAL_TABLET | Freq: Two times a day (BID) | ORAL | 3 refills | 90.00 days | Status: CP
Start: 2023-10-15 — End: 2024-10-14

## 2023-10-15 MED ORDER — INSULIN GLARGINE (U-100) 100 UNIT/ML (3 ML) SUBCUTANEOUS PEN
Freq: Every evening | SUBCUTANEOUS | 3 refills | 71.00 days | Status: CP
Start: 2023-10-15 — End: ?

## 2023-10-24 DIAGNOSIS — C23 Malignant neoplasm of gallbladder: Principal | ICD-10-CM

## 2023-11-24 DIAGNOSIS — C23 Malignant neoplasm of gallbladder: Principal | ICD-10-CM

## 2023-11-25 DIAGNOSIS — C23 Malignant neoplasm of gallbladder: Principal | ICD-10-CM

## 2023-12-02 DIAGNOSIS — C23 Malignant neoplasm of gallbladder: Principal | ICD-10-CM

## 2023-12-04 DIAGNOSIS — C23 Malignant neoplasm of gallbladder: Principal | ICD-10-CM

## 2023-12-05 DIAGNOSIS — C23 Malignant neoplasm of gallbladder: Principal | ICD-10-CM

## 2023-12-06 ENCOUNTER — Inpatient Hospital Stay: Admit: 2023-12-06 | Payer: MEDICARE

## 2023-12-06 ENCOUNTER — Ambulatory Visit: Admit: 2023-12-06 | Payer: MEDICARE

## 2023-12-13 ENCOUNTER — Ambulatory Visit: Admit: 2023-12-13 | Payer: MEDICARE

## 2023-12-23 DIAGNOSIS — C23 Malignant neoplasm of gallbladder: Principal | ICD-10-CM

## 2023-12-25 DIAGNOSIS — C23 Malignant neoplasm of gallbladder: Principal | ICD-10-CM

## 2024-01-03 ENCOUNTER — Inpatient Hospital Stay: Admit: 2024-01-03 | Discharge: 2024-01-04 | Payer: Medicare (Managed Care)

## 2024-01-03 DIAGNOSIS — C23 Malignant neoplasm of gallbladder: Principal | ICD-10-CM

## 2024-01-17 ENCOUNTER — Observation Stay: Admitting: Anesthesiology

## 2024-01-17 ENCOUNTER — Emergency Department

## 2024-01-17 ENCOUNTER — Encounter: Payer: Self-pay | Admitting: Emergency Medicine

## 2024-01-17 ENCOUNTER — Observation Stay
Admission: EM | Admit: 2024-01-17 | Discharge: 2024-01-18 | Disposition: A | Discharge status: Home / Self Care | Attending: Internal Medicine | Admitting: Internal Medicine

## 2024-01-17 ENCOUNTER — Encounter
Admission: EM | Disposition: A | Discharge status: Home / Self Care | Payer: Self-pay | Source: Home / Self Care | Attending: Emergency Medicine

## 2024-01-17 ENCOUNTER — Other Ambulatory Visit: Payer: Self-pay

## 2024-01-17 ENCOUNTER — Observation Stay

## 2024-01-17 DIAGNOSIS — N179 Acute kidney failure, unspecified: Secondary | ICD-10-CM | POA: Diagnosis not present

## 2024-01-17 DIAGNOSIS — E1122 Type 2 diabetes mellitus with diabetic chronic kidney disease: Secondary | ICD-10-CM | POA: Insufficient documentation

## 2024-01-17 DIAGNOSIS — E1169 Type 2 diabetes mellitus with other specified complication: Secondary | ICD-10-CM

## 2024-01-17 DIAGNOSIS — Z79899 Other long term (current) drug therapy: Secondary | ICD-10-CM | POA: Diagnosis not present

## 2024-01-17 DIAGNOSIS — R2681 Unsteadiness on feet: Secondary | ICD-10-CM | POA: Insufficient documentation

## 2024-01-17 DIAGNOSIS — Z7984 Long term (current) use of oral hypoglycemic drugs: Secondary | ICD-10-CM | POA: Insufficient documentation

## 2024-01-17 DIAGNOSIS — Z8509 Personal history of malignant neoplasm of other digestive organs: Secondary | ICD-10-CM

## 2024-01-17 DIAGNOSIS — N189 Chronic kidney disease, unspecified: Secondary | ICD-10-CM

## 2024-01-17 DIAGNOSIS — C830A Small cell b-cell lymphoma, in remission: Secondary | ICD-10-CM | POA: Insufficient documentation

## 2024-01-17 DIAGNOSIS — R7989 Other specified abnormal findings of blood chemistry: Secondary | ICD-10-CM

## 2024-01-17 DIAGNOSIS — I10 Essential (primary) hypertension: Secondary | ICD-10-CM | POA: Diagnosis present

## 2024-01-17 DIAGNOSIS — E871 Hypo-osmolality and hyponatremia: Secondary | ICD-10-CM | POA: Diagnosis not present

## 2024-01-17 DIAGNOSIS — R1084 Generalized abdominal pain: Secondary | ICD-10-CM | POA: Diagnosis present

## 2024-01-17 DIAGNOSIS — K8050 Calculus of bile duct without cholangitis or cholecystitis without obstruction: Secondary | ICD-10-CM | POA: Diagnosis not present

## 2024-01-17 DIAGNOSIS — E1129 Type 2 diabetes mellitus with other diabetic kidney complication: Secondary | ICD-10-CM | POA: Diagnosis present

## 2024-01-17 DIAGNOSIS — K219 Gastro-esophageal reflux disease without esophagitis: Secondary | ICD-10-CM | POA: Diagnosis not present

## 2024-01-17 DIAGNOSIS — R1010 Upper abdominal pain, unspecified: Principal | ICD-10-CM

## 2024-01-17 HISTORY — PX: ERCP: SHX5425

## 2024-01-17 LAB — COMPREHENSIVE METABOLIC PANEL WITH GFR
ALT: 61 U/L — ABNORMAL HIGH (ref 0–44)
AST: 68 U/L — ABNORMAL HIGH (ref 15–41)
Albumin: 4.1 g/dL (ref 3.5–5.0)
Alkaline Phosphatase: 233 U/L — ABNORMAL HIGH (ref 38–126)
Anion gap: 10 (ref 5–15)
BUN: 36 mg/dL — ABNORMAL HIGH (ref 8–23)
CO2: 19 mmol/L — ABNORMAL LOW (ref 22–32)
Calcium: 9.2 mg/dL (ref 8.9–10.3)
Chloride: 96 mmol/L — ABNORMAL LOW (ref 98–111)
Creatinine, Ser: 2.11 mg/dL — ABNORMAL HIGH (ref 0.61–1.24)
GFR, Estimated: 33 mL/min — ABNORMAL LOW (ref >60)
Glucose, Bld: 321 mg/dL — ABNORMAL HIGH (ref 70–99)
Potassium: 4.7 mmol/L (ref 3.5–5.1)
Sodium: 125 mmol/L — ABNORMAL LOW (ref 135–145)
Total Bilirubin: 2.6 mg/dL — ABNORMAL HIGH (ref 0.0–1.2)
Total Protein: 7.9 g/dL (ref 6.5–8.1)

## 2024-01-17 LAB — CBC WITH DIFFERENTIAL/PLATELET
Abs Immature Granulocytes: 0.04 10*3/uL (ref 0.00–0.07)
Basophils Absolute: 0.1 10*3/uL (ref 0.0–0.1)
Basophils Relative: 1 %
Eosinophils Absolute: 0.2 10*3/uL (ref 0.0–0.5)
Eosinophils Relative: 3 %
HCT: 39.3 % (ref 39.0–52.0)
Hemoglobin: 14.4 g/dL (ref 13.0–17.0)
Immature Granulocytes: 1 %
Lymphocytes Relative: 9 %
Lymphs Abs: 0.7 10*3/uL (ref 0.7–4.0)
MCH: 29.6 pg (ref 26.0–34.0)
MCHC: 36.6 g/dL — ABNORMAL HIGH (ref 30.0–36.0)
MCV: 80.9 fL (ref 80.0–100.0)
Monocytes Absolute: 0.4 10*3/uL (ref 0.1–1.0)
Monocytes Relative: 5 %
Neutro Abs: 6.3 10*3/uL (ref 1.7–7.7)
Neutrophils Relative %: 81 %
Platelets: UNDETERMINED 10*3/uL (ref 150–400)
RBC: 4.86 MIL/uL (ref 4.22–5.81)
RDW: 12.1 % (ref 11.5–15.5)
WBC: 7.7 10*3/uL (ref 4.0–10.5)
nRBC: 0 % (ref 0.0–0.2)

## 2024-01-17 LAB — URINALYSIS, ROUTINE W REFLEX MICROSCOPIC
Bilirubin Urine: NEGATIVE
Glucose, UA: NEGATIVE mg/dL
Hgb urine dipstick: NEGATIVE
Ketones, ur: NEGATIVE mg/dL
Leukocytes,Ua: NEGATIVE
Nitrite: NEGATIVE
Protein, ur: NEGATIVE mg/dL
Specific Gravity, Urine: 1.017 (ref 1.005–1.030)
pH: 5 (ref 5.0–8.0)

## 2024-01-17 LAB — BASIC METABOLIC PANEL WITH GFR
Anion gap: 7 (ref 5–15)
BUN: 29 mg/dL — ABNORMAL HIGH (ref 8–23)
CO2: 21 mmol/L — ABNORMAL LOW (ref 22–32)
Calcium: 8.3 mg/dL — ABNORMAL LOW (ref 8.9–10.3)
Chloride: 109 mmol/L (ref 98–111)
Creatinine, Ser: 1.74 mg/dL — ABNORMAL HIGH (ref 0.61–1.24)
GFR, Estimated: 42 mL/min — ABNORMAL LOW (ref >60)
Glucose, Bld: 222 mg/dL — ABNORMAL HIGH (ref 70–99)
Potassium: 4.2 mmol/L (ref 3.5–5.1)
Sodium: 137 mmol/L (ref 135–145)

## 2024-01-17 LAB — CBG MONITORING, ED: Glucose-Capillary: 233 mg/dL — ABNORMAL HIGH (ref 70–99)

## 2024-01-17 LAB — GLUCOSE, CAPILLARY
Glucose-Capillary: 106 mg/dL — ABNORMAL HIGH (ref 70–99)
Glucose-Capillary: 137 mg/dL — ABNORMAL HIGH (ref 70–99)
Glucose-Capillary: 161 mg/dL — ABNORMAL HIGH (ref 70–99)
Glucose-Capillary: 322 mg/dL — ABNORMAL HIGH (ref 70–99)
Glucose-Capillary: 97 mg/dL (ref 70–99)

## 2024-01-17 LAB — LIPASE, BLOOD: Lipase: 67 U/L — ABNORMAL HIGH (ref 11–51)

## 2024-01-17 LAB — LACTIC ACID, PLASMA
Lactic Acid, Venous: 1.9 mmol/L (ref 0.5–1.9)
Lactic Acid, Venous: 1 mmol/L (ref 0.5–1.9)

## 2024-01-17 LAB — HIV ANTIBODY (ROUTINE TESTING W REFLEX): HIV Screen 4th Generation wRfx: NONREACTIVE

## 2024-01-17 LAB — HEMOGLOBIN A1C
Hgb A1c MFr Bld: 9.4 % — ABNORMAL HIGH (ref 4.8–5.6)
Mean Plasma Glucose: 223.08 mg/dL

## 2024-01-17 SURGERY — ERCP, WITH INTERVENTION IF INDICATED
Anesthesia: General

## 2024-01-17 MED ORDER — DEXMEDETOMIDINE HCL IN NACL 80 MCG/20ML IV SOLN
INTRAVENOUS | Status: DC | PRN
  Administered 2024-01-17: 4 ug via INTRAVENOUS

## 2024-01-17 MED ORDER — MORPHINE SULFATE (PF) 2 MG/ML IV SOLN
2.0000 mg | Freq: Once | INTRAVENOUS | Status: AC
  Administered 2024-01-17: 2 mg via INTRAVENOUS
  Filled 2024-01-17: qty 1

## 2024-01-17 MED ORDER — FENTANYL CITRATE (PF) 100 MCG/2ML IJ SOLN
INTRAMUSCULAR | Status: AC
  Filled 2024-01-17: qty 2

## 2024-01-17 MED ORDER — LIDOCAINE HCL (CARDIAC) PF 100 MG/5ML IV SOSY
PREFILLED_SYRINGE | INTRAVENOUS | Status: DC | PRN
  Administered 2024-01-17: 100 mg via INTRAVENOUS

## 2024-01-17 MED ORDER — PHENYLEPHRINE 80 MCG/ML (10ML) SYRINGE FOR IV PUSH (FOR BLOOD PRESSURE SUPPORT)
PREFILLED_SYRINGE | INTRAVENOUS | Status: DC | PRN
  Administered 2024-01-17 (×2): 80 ug via INTRAVENOUS

## 2024-01-17 MED ORDER — FENTANYL CITRATE (PF) 100 MCG/2ML IJ SOLN
INTRAMUSCULAR | Status: DC | PRN
  Administered 2024-01-17: 50 ug via INTRAVENOUS

## 2024-01-17 MED ORDER — DICLOFENAC SUPPOSITORY 100 MG
RECTAL | Status: AC
  Filled 2024-01-17: qty 1

## 2024-01-17 MED ORDER — SODIUM CHLORIDE 0.9 % IV BOLUS
2000.0000 mL | Freq: Once | INTRAVENOUS | Status: AC
  Administered 2024-01-17: 2000 mL via INTRAVENOUS

## 2024-01-17 MED ORDER — EPHEDRINE SULFATE-NACL 50-0.9 MG/10ML-% IV SOSY
PREFILLED_SYRINGE | INTRAVENOUS | Status: DC | PRN
  Administered 2024-01-17: 10 mg via INTRAVENOUS
  Administered 2024-01-17: 5 mg via INTRAVENOUS
  Administered 2024-01-17: 10 mg via INTRAVENOUS

## 2024-01-17 MED ORDER — VANCOMYCIN HCL 1750 MG/350ML IV SOLN
1750.0000 mg | Freq: Once | INTRAVENOUS | Status: AC
  Administered 2024-01-17: 1750 mg via INTRAVENOUS
  Filled 2024-01-17: qty 350

## 2024-01-17 MED ORDER — SODIUM CHLORIDE 0.9 % IV SOLN: INTRAVENOUS | Status: DC

## 2024-01-17 MED ORDER — PROPOFOL 500 MG/50ML IV EMUL
INTRAVENOUS | Status: DC | PRN
  Administered 2024-01-17: 120 ug/kg/min via INTRAVENOUS
  Administered 2024-01-17: 20 mg via INTRAVENOUS

## 2024-01-17 MED ORDER — DICLOFENAC SUPPOSITORY 100 MG
100.0000 mg | Freq: Once | RECTAL | Status: DC
  Filled 2024-01-17: qty 1

## 2024-01-17 MED ORDER — DICLOFENAC SUPPOSITORY 100 MG
RECTAL | Status: DC | PRN
  Administered 2024-01-17: 100 mg via RECTAL

## 2024-01-17 MED ORDER — ENOXAPARIN SODIUM 40 MG/0.4ML IJ SOSY: 40.0000 mg | PREFILLED_SYRINGE | INTRAMUSCULAR | Status: DC

## 2024-01-17 MED ORDER — IOHEXOL 300 MG/ML  SOLN: 75.0000 mL | Freq: Once | INTRAMUSCULAR | Status: DC | PRN

## 2024-01-17 MED ORDER — LIDOCAINE HCL URETHRAL/MUCOSAL 2 % EX GEL
CUTANEOUS | Status: DC | PRN
  Administered 2024-01-17: .5 via TOPICAL

## 2024-01-17 MED ORDER — ONDANSETRON HCL 4 MG/2ML IJ SOLN
4.0000 mg | Freq: Four times a day (QID) | INTRAMUSCULAR | Status: DC | PRN
Start: 2024-01-17 — End: 2024-01-18

## 2024-01-17 MED ORDER — LACTATED RINGERS IV SOLN
INTRAVENOUS | Status: DC
Start: 2024-01-17 — End: 2024-01-17

## 2024-01-17 MED ORDER — PANTOPRAZOLE SODIUM 40 MG IV SOLR
40.0000 mg | Freq: Once | INTRAVENOUS | Status: AC
  Administered 2024-01-17: 40 mg via INTRAVENOUS
  Filled 2024-01-17: qty 10

## 2024-01-17 MED ORDER — SODIUM CHLORIDE 0.9 % IV SOLN
2.0000 g | Freq: Once | INTRAVENOUS | Status: AC
  Administered 2024-01-17: 2 g via INTRAVENOUS
  Filled 2024-01-17: qty 12.5

## 2024-01-17 MED ORDER — ONDANSETRON HCL 4 MG PO TABS: 4.0000 mg | ORAL_TABLET | Freq: Four times a day (QID) | ORAL | Status: DC | PRN

## 2024-01-17 MED ORDER — METRONIDAZOLE 500 MG/100ML IV SOLN
500.0000 mg | Freq: Once | INTRAVENOUS | Status: AC
  Administered 2024-01-17: 500 mg via INTRAVENOUS
  Filled 2024-01-17: qty 100

## 2024-01-17 MED ORDER — INSULIN ASPART 100 UNIT/ML IJ SOLN
0.0000 [IU] | INTRAMUSCULAR | Status: DC
  Administered 2024-01-17: 3 [IU] via SUBCUTANEOUS
  Administered 2024-01-17: 7 [IU] via SUBCUTANEOUS
  Administered 2024-01-17: 2 [IU] via SUBCUTANEOUS
  Filled 2024-01-17 (×3): qty 1

## 2024-01-17 NOTE — ED Notes (Signed)
 Spoke with Dr Margery Sheets regarding pt's hx and presenting c/o; no further orders given at this time

## 2024-01-17 NOTE — Evaluation (Signed)
 Occupational Therapy Evaluation Patient Details Name: [name] MRN: [id number] DOB: [birth year] Today's Date: 01/17/2024   History of Present Illness   68 y.o. male with medical history significant of gallbladder cancer status post cholecystectomy, B-cell lymphoma in remission, poorly controlled type 2 diabetes, history of colon cancer s/p resection presenting with choledocholithiasis, acute on chronic kidney, hyperglycemia.  Patient reports roughly 1 to 2 weeks of generalized abdominal pain. Now s/p ERCP for 6mm common bile duct stone on 01/17/24.     Clinical Impressions Pt was seen for OT evaluation this date. Prior to hospital admission, pt was indep with mobility, ADL, and IADL. Denies recent falls. Pt lives with his spouse on the main floor of their home (basement is a fully enclosed apartment). Pt presents to acute OT demonstrating impaired ADL performance and functional mobility 2/2 mild balance deficits and mild abdominal pain (See OT problem list for additional functional deficits). Pt completed all aspects of UB/LB dressing and toileting with modified independence. Supv for ADL transfers without AD, and CGA for ADL mobility in hall with intermittent fingertip touch noted and slight unsteadiness especially with head turns and multitasking. Do not anticipate the need for additional follow up OT services for ADL at this time. Will sign off. Pt will benefit from a PT evaluation to further assess higher level balance.     If plan is discharge home, recommend the following:   A little help with walking and/or transfers;Help with stairs or ramp for entrance    Functional Status Assessment   Patient has had a recent decline in their functional status and demonstrates the ability to make significant improvements in function in a reasonable and predictable amount of time.     Equipment Recommendations   None recommended by OT      Precautions/Restrictions    Precautions Precautions: Fall Recall of Precautions/Restrictions: Intact Restrictions Weight Bearing Restrictions Per Provider Order: No     Mobility Bed Mobility Overal bed mobility: Modified Independent       Transfers Overall transfer level: Needs assistance Equipment used: None Transfers: Sit to/from Stand Sit to Stand: Supervision           Balance Overall balance assessment: Needs assistance Sitting-balance support: No upper extremity supported, Feet supported Sitting balance-Leahy Scale: Good     Standing balance support: No upper extremity supported Standing balance-Leahy Scale: Fair Standing balance comment: able to self correct for slight loss of balance while walking in hall, higher level balance is a challenge, PRN reaches out for furniture/hall rail         ADL either performed or assessed with clinical judgement   ADL Overall ADL's : Modified independent      General ADL Comments: completed LB dressing, UB dressing, and all aspects of toileting wiht modified independence      Pertinent Vitals/Pain Pain Assessment Pain Assessment: 0-10 Pain Score: 2  Pain Location: abdomen Pain Descriptors / Indicators: Aching Pain Intervention(s): Monitored during session, Limited activity within patient's tolerance, Repositioned     Extremity/Trunk Assessment Upper Extremity Assessment Upper Extremity Assessment: Overall WFL for tasks assessed   Lower Extremity Assessment Lower Extremity Assessment: Defer to PT evaluation   Cervical / Trunk Assessment Cervical / Trunk Assessment: Normal   Communication Communication Communication: Other (comment) (speech slow, slow processing)   Cognition Arousal: Alert Behavior During Therapy: WFL for tasks assessed/performed Cognition: No apparent impairments      Following commands: Intact       Cueing  General Comments  Cueing Techniques: Verbal cues      Exercises Other Exercises Other Exercises:  Pt educated in role of acute OT, falls prevention Other Exercises: Pt ambulated with CGA ~180' around nurses station with PRN UE fingertip touch reaching out for the hall rail.        Home Living Family/patient expects to be discharged to:: Private residence Living Arrangements: Spouse/significant other Available Help at Discharge: Family Type of Home: House Home Access: Ramped entrance     Home Layout: Able to live on main level with bedroom/bathroom (basement is a self contained apartment)     Bathroom Shower/Tub: Runner, broadcasting/film/video: Rollator (4 wheels);Shower seat - built in;Cane - single point          Prior Functioning/Environment Prior Level of Function : Driving;Independent/Modified Independent      Mobility Comments: no AD, no falls ADLs Comments: indep    OT Problem List: Impaired balance (sitting and/or standing)        OT Goals(Current goals can be found in the care plan section)   Acute Rehab OT Goals Patient Stated Goal: get better and go home OT Goal Formulation: All assessment and education complete, DC therapy   AM-PAC OT "6 Clicks" Daily Activity     Outcome Measure Help from another person eating meals?: None Help from another person taking care of personal grooming?: None Help from another person toileting, which includes using toliet, bedpan, or urinal?: None Help from another person bathing (including washing, rinsing, drying)?: None Help from another person to put on and taking off regular upper body clothing?: None Help from another person to put on and taking off regular lower body clothing?: None 6 Click Score: 24   End of Session Equipment Utilized During Treatment: Gait belt  Activity Tolerance: Patient tolerated treatment well Patient left: in bed;with call bell/phone within reach;with bed alarm set  OT Visit Diagnosis: Unsteadiness on feet (R26.81)                Time: 4696-2952 OT Time Calculation (min): 39  min Charges:  OT General Charges $OT Visit: 1 Visit OT Evaluation $OT Eval Low Complexity: 1 Low OT Treatments $Self Care/Home Management : 8-22 mins  Berenda Breaker., MPH, MS, OTR/L ascom [phone] 01/17/24, 4:10 PM

## 2024-01-17 NOTE — Assessment & Plan Note (Addendum)
 Hemoglobin A1c elevated 9.4.  Patient has underlying chronic kidney disease stage IIIa.  Patient's sugars were on the lower side but then went up prior to lunch.  Restarted his long-acting insulin  which he will do at 12 noon tomorrow.  Can restart his Jardiance .  Hold metformin  for now.

## 2024-01-17 NOTE — Assessment & Plan Note (Addendum)
 Creatinine 2.1 upon admission and 1.49 upon discharge.  Patient has underlying chronic kidney disease stage IIIa.

## 2024-01-17 NOTE — H&P (Signed)
 History and Physical    Patient: [name] ZHY:[id number] DOB: May 24, 1956 DOA: 01/17/2024 DOS: the patient was seen and examined on 01/17/2024 PCP: Kenton Pearl, MD  Patient coming from: Home  Chief Complaint:  Chief Complaint  Patient presents with   Abdominal Pain   HPI: [name] is a 68 y.o. male with medical history significant of gallbladder cancer status post cholecystectomy, B-cell lymphoma in remission, poorly controlled type 2 diabetes, history of colon cancer s/p resection presenting with choledocholithiasis, acute on chronic kidney, hyperglycemia.  Patient reports roughly 1 to 2 weeks of generalized abdominal pain.  Abdominal pain mild to moderate in intensity.  Minimal nausea.  No chest pain or shortness of breath.  Patient reports chronic diarrhea.  No reported black or bilious stools.  Non-smoker.  Denies any alcohol or illicit drug use.  Gets care predominantly in the [facility] system.  Mild difficulty with ambulation chronically. Presented to the ER afebrile, hemodynamically stable.  Satting well on room air.  White count 7.7, hemoglobin 14.4.  Creatinine 2.1, glucose 321, AST 68, ALT 61, T. Bili 2.6.  CT abdomen pelvis with noted 6 mm common bile duct stone.  Per Dr. Ashok Laws, case discussed with Dr. Mamie Searles overnight who will coordinate with Dr. Ole Berkeley for ERCP Review of Systems: As mentioned in the history of present illness. All other systems reviewed and are negative. Past Medical History:  Diagnosis Date   Cancer (HCC)    Diabetes mellitus without complication [facility])    Past Surgical History:  Procedure Laterality Date   OTHER SURGICAL HISTORY     J pouch 1997, history of ileostomy with reversal   POUCHOSCOPY N/A 04/10/2022   Procedure: POUCHOSCOPY;  Surgeon: Quintin Buckle, DO;  Location: [facility] ENDOSCOPY;  Service: Gastroenterology;  Laterality: N/A;   Social History:  reports that he has never smoked. He has never used smokeless tobacco. He reports that  he does not drink alcohol and does not use drugs.  Allergies  Allergen Reactions   Sulfa Antibiotics Rash and Hives   Oxycodone  Other (See Comments) and Rash    Rash and headache    Family History  Problem Relation Age of Onset   Colon cancer Sister    Crohn's disease Brother     Prior to Admission medications   Medication Sig Start Date End Date Taking? Authorizing Provider  albuterol  (VENTOLIN  HFA) 108 (90 Base) MCG/ACT inhaler Inhale 2 puffs into the lungs every 2 (two) hours as needed for wheezing or shortness of breath. 07/27/22   Alexander, Natalie, DO  cyclobenzaprine  (FLEXERIL ) 10 MG tablet Take 1 tablet (10 mg total) by mouth 3 (three) times daily as needed for muscle spasms. 07/27/22   Alexander, Natalie, DO  JARDIANCE  10 MG TABS tablet Take 10 mg by mouth daily. 03/28/22   [provider]  LANTUS  SOLOSTAR 100 UNIT/ML Solostar Pen Inject 35 Units into the skin at bedtime. 09/29/19   [provider]  LORazepam  (ATIVAN ) 0.5 MG tablet Take 0.5 mg by mouth daily as needed.    [provider]  metFORMIN  (GLUCOPHAGE ) 1000 MG tablet Take 1,000 mg by mouth 2 (two) times daily. 04/08/17   [provider]  mupirocin  ointment (BACTROBAN ) 2 % Apply 1 Application topically daily.    [provider]  OLANZapine  (ZYPREXA ) 5 MG tablet Take 1.5 tablets (7.5 mg total) by mouth at bedtime. 07/27/22 08/26/22  Alexander, Natalie, DO  omeprazole (PRILOSEC) 40 MG capsule Take 40 mg by mouth daily.  [provider]  venlafaxine  XR (EFFEXOR -XR) 150 MG 24 hr capsule Take 150 mg by mouth daily. 03/20/17   [provider]    Physical Exam: Vitals:   01/17/24 0346 01/17/24 0508 01/17/24 0510 01/17/24 0600  BP: 136/69 114/62  108/61  Pulse: 83 81  80  Resp: 18 16  16   Temp: 98.2 F (36.8 C)  98.2 F (36.8 C)   TempSrc: Oral  Oral   SpO2: 100% 100%  100%  Weight: 76.7 kg     Height: 5\' 10"  (1.778 m)      Physical Exam Constitutional:       Appearance: He is normal weight.  HENT:     Head: Normocephalic and atraumatic.     Nose: Nose normal.     Mouth/Throat:     Mouth: Mucous membranes are moist.  Eyes:     Pupils: Pupils are equal, round, and reactive to light.  Cardiovascular:     Rate and Rhythm: Normal rate and regular rhythm.  Pulmonary:     Effort: Pulmonary effort is normal.  Abdominal:     General: Bowel sounds are normal.  Skin:    General: Skin is warm.  Neurological:     General: No focal deficit present.  Psychiatric:        Mood and Affect: Mood normal.     Data Reviewed:  There are no new results to review at this time.  CT ABDOMEN PELVIS WO CONTRAST CLINICAL DATA:  Abdominal pain, history of gallbladder cancer and resection, in remission.  EXAM: CT ABDOMEN AND PELVIS WITHOUT CONTRAST  TECHNIQUE: Multidetector CT imaging of the abdomen and pelvis was performed following the standard protocol without IV contrast.  RADIATION DOSE REDUCTION: This exam was performed according to the departmental dose-optimization program which includes automated exposure control, adjustment of the mA and/or kV according to patient size and/or use of iterative reconstruction technique.  COMPARISON:  07/26/2022  FINDINGS: Lower chest:  No contributory findings.  Hepatobiliary: No focal liver abnormality.Cholecystectomy. No biliary dilatation but there is a calcified density in the region of the distal CBD which measures 6 mm.  Pancreas: Generalized atrophy.  No visible inflammation.  Spleen: Unremarkable.  Adrenals/Urinary Tract: Negative adrenals. No hydronephrosis or stone. Unremarkable bladder.  Stomach/Bowel: Colectomy with ileoanal anastomosis. Prominent wall thickness of bowel above the anastomosis is unchanged in without fat inflammation. Scattered small bowel fluid, overall nonobstructive bowel gas pattern.  Vascular/Lymphatic: No acute vascular abnormality. No mass or adenopathy.  Atheromatous calcifications, mild.  Reproductive:Mildly enlarged prostate up lifting the bladder base.  Other: No ascites or pneumoperitoneum.  Musculoskeletal: No acute abnormalities. Lumbar spine degeneration with mild scoliosis.  IMPRESSION: Suspect a 6 mm distal CBD stone.  No biliary dilatation.  Chronic findings are stable from 2023 as described.  Electronically Signed   By: Ronnette Coke M.D.   On: 01/17/2024 05:50 DG Chest Port 1 View CLINICAL DATA:  Sepsis  EXAM: PORTABLE CHEST 1 VIEW  COMPARISON:  07/26/2022  FINDINGS: The heart size and mediastinal contours are within normal limits. Both lungs are clear. The visualized skeletal structures are unremarkable. Dual lumen right chest wall power injectable Port-A-Cath tip in the proximal right atrium.  IMPRESSION: No active disease.  Electronically Signed   By: Juanetta Nordmann M.D.   On: 01/17/2024 03:51  Lab Results  Component Value Date   WBC 7.7 01/17/2024   HGB 14.4 01/17/2024   HCT 39.3 01/17/2024   MCV 80.9 01/17/2024   PLT PLATELET  CLUMPS NOTED ON SMEAR, UNABLE TO ESTIMATE 01/17/2024   Last metabolic panel Lab Results  Component Value Date   GLUCOSE 321 (H) 01/17/2024   NA 125 (L) 01/17/2024   K 4.7 01/17/2024   CL 96 (L) 01/17/2024   CO2 19 (L) 01/17/2024   BUN 36 (H) 01/17/2024   CREATININE 2.11 (H) 01/17/2024   GFRNONAA 33 (L) 01/17/2024   CALCIUM  9.2 01/17/2024   PHOS 2.1 (L) 12/07/2019   PROT 7.9 01/17/2024   ALBUMIN 4.1 01/17/2024   BILITOT 2.6 (H) 01/17/2024   ALKPHOS 233 (H) 01/17/2024   AST 68 (H) 01/17/2024   ALT 61 (H) 01/17/2024   ANIONGAP 10 01/17/2024    Assessment and Plan: * Choledocholithiasis History of GB cancer s/p cholecystectomy + abdominal pain x 2-3 days  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 Ole Berkeley with GI aware of case per report  Plan for ERCP  Follow up formal GI recommendations    Acute kidney injury superimposed on chronic kidney disease (HCC) Creatinine 2.1 today with baseline creatinine around 1.4-1.7. Appears mildly dry IV fluid hydration Hold nephrotoxic agents Monitor  Type II diabetes mellitus with renal manifestations (HCC) Blood sugar 300s  SSI  A1C  Monitor    Essential hypertension BP stable  Titrate home regimen    Small cell b-cell lymphoma, in remission S/p R-CHOP last dose on 09/2020 subsequent PET on 02/2021 showed complete remission  Followed in the [facility] system    Pseudohyponatremia Na 125 w/ glucose 321 Corrected Na at 130  Mildly dry  MIVF w/ NS @100ccs /hr  Monitor   GERD without esophagitis PPI      Advance Care Planning:   Code Status: Full Code   Consults: GI   Family Communication: No family at the bedside   Severity of Illness: The appropriate patient status for this patient is OBSERVATION. Observation status is judged to be reasonable and necessary in order to provide the required intensity of service to ensure the patient's safety. The patient's presenting symptoms, physical exam findings, and initial radiographic and laboratory data in the context of their medical condition is felt to place them at decreased risk for further clinical deterioration. Furthermore, it is anticipated that the patient will be medically stable for discharge from the hospital within 2 midnights of admission.   Author: Corrinne Din, MD 01/17/2024 7:20 AM  For on call review [email or web address].

## 2024-01-17 NOTE — ED Notes (Addendum)
 Answered call light, pt states needs to go to the bathroom, MRI specialist came into the room getting ready to take pt to MRI, ask pt if he would like a urinal, pt stated "no," ask pt if he needed assistance to the toilet pt advised no.

## 2024-01-17 NOTE — Op Note (Signed)
 [facility] Gastroenterology Patient Name: [name] Procedure Date: 01/17/2024 11:21 AM MRN: [id number] Account #: [id number] Date of Birth: 1956-05-30 Admit Type: Inpatient Age: 68 Room: [facility] ENDO ROOM 4 Gender: Male Note Status: Finalized Instrument Name: [id number] Procedure:             ERCP Indications:           Common bile duct stone(s) Providers:             Marnee Sink MD, MD Medicines:             Propofol  per Anesthesia Complications:         No immediate complications. Procedure:             Pre-Anesthesia Assessment:                        - Prior to the procedure, a History and Physical was                         performed, and patient medications and allergies were                         reviewed. The patient's tolerance of previous                         anesthesia was also reviewed. The risks and benefits                         of the procedure and the sedation options and risks                         were discussed with the patient. All questions were                         answered, and informed consent was obtained. Prior                         Anticoagulants: The patient has taken no anticoagulant                         or antiplatelet agents. ASA Grade Assessment: II - A                         patient with mild systemic disease. After reviewing                         the risks and benefits, the patient was deemed in                         satisfactory condition to undergo the procedure.                        After obtaining informed consent, the scope was passed                         under direct vision. Throughout the procedure, the                         patient's blood pressure,  pulse, and oxygen                         saturations were monitored continuously. The                         Duodenoscope was introduced through the mouth, and                         used to inject contrast into and used to inject                          contrast into the bile duct. The ERCP was accomplished                         without difficulty. The patient tolerated the                         procedure well. Findings:      The scout film was normal. The esophagus was successfully intubated       under direct vision. The scope was advanced to a normal major papilla in       the descending duodenum without detailed examination of the pharynx,       larynx and associated structures, and upper GI tract. The upper GI tract       was grossly normal. The bile duct was deeply cannulated with the       short-nosed traction sphincterotome. Contrast was injected. I personally       interpreted the bile duct images. There was brisk flow of contrast       through the ducts. Image quality was excellent. Contrast extended to the       entire biliary tree. The lower third of the main bile duct contained one       stone, which was 5 mm in diameter. A wire was passed into the biliary       tree. A 7 mm biliary sphincterotomy was made with a traction (standard)       sphincterotome using ERBE electrocautery. There was no       post-sphincterotomy bleeding. The biliary tree was swept with a 15 mm       balloon starting at the bifurcation. One stone was removed. No stones       remained. Impression:            - Choledocholithiasis was found. Complete removal was                         accomplished by biliary sphincterotomy and balloon                         extraction.                        - A biliary sphincterotomy was performed.                        - The biliary tree was swept. Recommendation:        - Return patient to hospital ward for ongoing care.                        -  Clear liquid diet today.                        - Continue present medications.                        - Watch for pancreatitis, bleeding, perforation, and                         cholangitis. Procedure Code(s):     --- Professional ---                         [phone], Endoscopic retrograde cholangiopancreatography                         (ERCP); with removal of calculi/debris from                         biliary/pancreatic duct(s)                        [id number], Endoscopic retrograde cholangiopancreatography                         (ERCP); with sphincterotomy/papillotomy                        [phone], Endoscopic catheterization of the biliary                         ductal system, radiological supervision and                         interpretation Diagnosis Code(s):     --- Professional ---                        K80.50, Calculus of bile duct without cholangitis or                         cholecystitis without obstruction CPT copyright 2022 American [employer] Association. All rights reserved. The codes documented in this report are preliminary and upon coder review may  be revised to meet current compliance requirements. Marnee Sink MD, MD 01/17/2024 11:58:26 AM This report has been signed electronically. Number of Addenda: 0 Note Initiated On: 01/17/2024 11:21 AM Estimated Blood Loss:  Estimated blood loss: none.      [facility]

## 2024-01-17 NOTE — Assessment & Plan Note (Deleted)
 BP stable Titrate home regimen

## 2024-01-17 NOTE — Assessment & Plan Note (Addendum)
 Sodium normal range upon discharge

## 2024-01-17 NOTE — Assessment & Plan Note (Signed)
 PPI ?

## 2024-01-17 NOTE — ED Provider Notes (Addendum)
 [facility] Provider Note    Event Date/Time   First MD Initiated Contact with Patient 01/17/24 [phone]     (approximate)   History   Abdominal Pain   HPI  [name] is a 68 y.o. 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  He has had some loose stools, not unusual for him, no significant changes in the bowel movement, no GU symptoms, and no fever or chills.  No nausea or vomiting.  The pain is constant.  At times it will get quite severe.  He reports no chest pain shortness of breath or respiratory infectious symptoms.  External Medical Documents Reviewed: Hematology/oncology notes that he brings with him documenting his cancer history and treatment history      Physical Exam   Triage Vital Signs: ED Triage Vitals  Encounter Vitals Group     BP 01/17/24 0025 94/67     Systolic BP Percentile --      Diastolic BP Percentile --      Pulse Rate 01/17/24 0025 (!) 109     Resp 01/17/24 0025 18     Temp 01/17/24 0025 98.7 F (37.1 C)     Temp Source 01/17/24 0025 Oral     SpO2 01/17/24 0025 100 %     Weight 01/17/24 0022 169 lb (76.7 kg)     Height 01/17/24 0022 5\' 10"  (1.778 m)     Head Circumference --      Peak Flow --      Pain Score 01/17/24 0022 8     Pain Loc --      Pain Education --      Exclude from Growth Chart --     Most recent vital signs: Vitals:   01/17/24 0510 01/17/24 0600  BP:  108/61  Pulse:  80  Resp:  16  Temp: 98.2 F (36.8 C)   SpO2:  100%    General: Awake, no distress.  CV:  Good peripheral perfusion.  Resp:  Normal effort.  Abd:  No distention.  Other:  He is tachycardic and his blood pressures are soft at 90s over 60s.  He has well-healed old appearing surgical scars throughout  his abdomen.  He has he has upper abdominal tenderness left greater than right tenderness palpation without rigidity or guarding.   ED Results / Procedures / Treatments   Labs (all labs ordered are listed, but only abnormal results are displayed) Labs Reviewed  CBC WITH DIFFERENTIAL/PLATELET - Abnormal; Notable for the following components:      Result Value   MCHC 36.6 (*)    All other components within normal limits  COMPREHENSIVE METABOLIC PANEL WITH GFR - Abnormal; Notable for the following components:   Sodium 125 (*)    Chloride 96 (*)    CO2 19 (*)    Glucose, Bld 321 (*)    BUN 36 (*)    Creatinine, Ser 2.11 (*)    AST 68 (*)    ALT 61 (*)    Alkaline Phosphatase 233 (*)    Total Bilirubin 2.6 (*)    GFR, Estimated 33 (*)    All other components within normal limits  LIPASE, BLOOD - Abnormal; Notable for the following components:   Lipase 67 (*)    All other components  within normal limits  URINALYSIS, ROUTINE W REFLEX MICROSCOPIC - Abnormal; Notable for the following components:   Color, Urine YELLOW (*)    APPearance HAZY (*)    All other components within normal limits  CULTURE, BLOOD (ROUTINE X 2)  CULTURE, BLOOD (ROUTINE X 2)  LACTIC ACID, PLASMA  LACTIC ACID, PLASMA     I ordered and reviewed the above labs they are notable for hyponatremia, creatinine of 2.11 from 1.7 last checked, mildly elevated LFTs, bilirubin, lipase.  EKG  ED ECG REPORT I, Buell Carmin, the attending physician, personally viewed and interpreted this ECG.   Date: 01/17/2024  EKG Time: 0022  Rate: 110  Rhythm: sinus tachycardia  Axis: nl  Intervals:nl  ST&T Change: no stemi    RADIOLOGY I independently reviewed and interpreted chest x-ray and I see no obvious focality or pneumothorax I also reviewed radiologist's formal read.   PROCEDURES:  Critical Care performed: Yes, see critical care procedure note(s)  .Critical Care  Performed by: Buell Carmin, MD Authorized by:  Buell Carmin, MD   Critical care provider statement:    Critical care time (minutes):  50   Critical care was time spent personally by me on the following activities:  Development of treatment plan with patient or surrogate, discussions with consultants, evaluation of patient's response to treatment, examination of patient, ordering and review of laboratory studies, ordering and review of radiographic studies, ordering and performing treatments and interventions, pulse oximetry, re-evaluation of patient's condition and review of old charts    MEDICATIONS ORDERED IN ED: Medications  vancomycin  (VANCOREADY) IVPB 1750 mg/350 mL (1,750 mg Intravenous New Bag/Given 01/17/24 0501)  iohexol  (OMNIPAQUE ) 300 MG/ML solution 75 mL (has no administration in time range)  pantoprazole  (PROTONIX ) injection 40 mg (has no administration in time range)  sodium chloride  0.9 % bolus 2,000 mL (2,000 mLs Intravenous New Bag/Given 01/17/24 0456)  morphine  (PF) 2 MG/ML injection 2 mg (2 mg Intravenous Given 01/17/24 0442)  ceFEPIme  (MAXIPIME ) 2 g in sodium chloride  0.9 % 100 mL IVPB (0 g Intravenous Stopped 01/17/24 0510)  metroNIDAZOLE  (FLAGYL ) IVPB 500 mg (0 mg Intravenous Stopped 01/17/24 0636)    External physician / consultants:  I spoke with hospital medicine for admission and regarding care plan for this patient.   IMPRESSION / MDM / ASSESSMENT AND PLAN / ED COURSE  I reviewed the triage vital signs and the nursing notes.                                Patient's presentation is most consistent with acute presentation with potential threat to life or bodily function.  Differential diagnosis includes, but is not limited to, intra-abdominal infection, gastritis/gerd/ulcer/perf, obstruction, sepsis, electrolyte derangements, dehydration   The patient is on the cardiac monitor to evaluate for evidence of arrhythmia and/or significant heart rate changes.  MDM:    Is a patient who is in remission from  significant cancer and surgical history with abdominal pain and is tachycardic and has a slightly low blood pressure and concern for sepsis.  He is tender in the abdomen I will obtain a CT of the abdomen pelvis.  In the meantime I am giving him IV fluids 30 cc/kg ideal body weight fluid bolus, getting sepsis labs including lactic and blood cultures, and treating with empiric antibiotics with vancomycin , cefepime  and Flagyl .  I will give him IV morphine  for pain control.  I anticipate admission given that  his initial blood work shows some abnormalities including AKI, hyponatremia.    -- His kidney function is worse than his normal, with a GFR in the 30s will do the CT scan without contrast.  Pending the results of his CT scan, patient to be admitted given his abdominal pain and electrolyte abnormalities, AKI.  CT imaging with suspected 6 mm hyperdensity in the area of the distal CBD without biliary dilation -given LFT abnormalities and elevated bilirubin this very well may the etiology of his pain.  Proceed with MRCP to better assess for  choledocholithiasis. GI consult  -- I spoke with Dr. Mamie Searles who spoke with Dr. Ole Berkeley regarding the patient and reviewed the case who can proceed with ERCP without the MRCP.  I then spoke with MRI technician who had not yet started the scan we will bring patient back to the emergency department.  Keep n.p.o. for procedure later today.     FINAL CLINICAL IMPRESSION(S) / ED DIAGNOSES   Final diagnoses:  Pain of upper abdomen  AKI (acute kidney injury) (HCC)  Hyponatremia  Choledocholithiasis     Rx / DC Orders   ED Discharge Orders     None        Note:  This document was prepared using Dragon voice recognition software and may include unintentional dictation errors.    Buell Carmin, MD 01/17/24 4098    Buell Carmin, MD 01/17/24 1191    Buell Carmin, MD 01/17/24 4782    Buell Carmin, MD 01/17/24 Niels Barry    Buell Carmin, MD 01/17/24 [id number]     Buell Carmin, MD 01/17/24 [phone]

## 2024-01-17 NOTE — Progress Notes (Signed)
 OT Cancellation Note  Patient Details Name: [name] MRN: [id number] DOB: 1956/03/01   Cancelled Treatment:    Reason Eval/Treat Not Completed: Patient at procedure or test/ unavailable. Pt out of the room for a procedure. Will attempt OT evaluation next date as medically appropriate.   Misti Towle R., MPH, MS, OTR/L ascom [phone] 01/17/24, 12:45 PM

## 2024-01-17 NOTE — Anesthesia Preprocedure Evaluation (Addendum)
 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 The left ventricle is normal in size with mildly increased wall thickness.    2. The left ventricular systolic function is normal, LVEF is visually estimated at 60%.    3. The left atrium is mildly dilated in size.    4. The right ventricle is normal in size, with normal systolic function.    5. There are no significant valvular abnormalities.    Neuro/Psych  PSYCHIATRIC DISORDERS (PTSD) Anxiety Depression    negative neurological ROS     GI/Hepatic ,GERD  ,,Ulcerative colitis; gallbladder CA   Endo/Other  diabetes, Poorly Controlled, Type 2    Renal/GU Renal disease (AKI on stage III CKD)     Musculoskeletal   Abdominal   Peds  Hematology B cell lymphoma   Anesthesia Other Findings   Reproductive/Obstetrics                             Anesthesia Physical Anesthesia Plan  ASA: 3  Anesthesia Plan: General   Post-op Pain Management:    Induction: Intravenous  PONV Risk Score and Plan: 2 and Ondansetron , Dexamethasone, Treatment may vary due to age or medical condition, Propofol  infusion and TIVA  Airway Management Planned: Natural Airway  Additional Equipment:   Intra-op Plan:   Post-operative Plan:   Informed Consent:  I have reviewed the patients History and Physical, chart, labs and discussed the procedure including the risks, benefits and alternatives for the proposed anesthesia with the patient or authorized representative who has indicated his/her understanding and acceptance.     Dental advisory given  Plan Discussed with: CRNA  Anesthesia Plan Comments: (LMA/GETA backup discussed.  Patient consented for risks of anesthesia including but not limited to:  - adverse reactions to medications - damage to eyes, teeth, lips or other oral mucosa - nerve damage due to positioning  - sore throat or hoarseness - damage to heart, brain, nerves, lungs, other parts of body or loss of life  Informed patient about role of CRNA in peri- and intra-operative care.  Patient voiced understanding.)        Anesthesia Quick Evaluation

## 2024-01-17 NOTE — ED Notes (Signed)
 Answered call light, pt ask for remote, 2 warm blankets and a sheet, provided and updated VS.

## 2024-01-17 NOTE — ED Triage Notes (Signed)
 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

## 2024-01-17 NOTE — Transfer of Care (Signed)
 Immediate Anesthesia Transfer of Care Note  Patient: [name]  Procedure(s) Performed: ERCP, WITH INTERVENTION IF INDICATED  Patient Location: PACU  Anesthesia Type:MAC  Level of Consciousness: sedated  Airway & Oxygen Therapy: Patient Spontanous Breathing and Patient connected to face mask oxygen  Post-op Assessment: Report given to RN and Post -op Vital signs reviewed and stable  Post vital signs: stable  Last Vitals:  Vitals Value Taken Time  BP 96/59 01/17/24 1203  Temp    Pulse 75 01/17/24 1205  Resp 18 01/17/24 1205  SpO2 100 % 01/17/24 1205  Vitals shown include unfiled device data.  Last Pain:  Vitals:   01/17/24 0902  TempSrc: Oral  PainSc:          Complications: No notable events documented.

## 2024-01-17 NOTE — Assessment & Plan Note (Addendum)
 History of GB cancer s/p cholecystectomy Patient did well status post ERCP with biliary sphincterotomy and stone removal.  Patient tolerated advance diet and was discharged home in stable condition.  Patient given Augmentin  upon discharge.

## 2024-01-17 NOTE — Consult Note (Signed)
 Marnee Sink, MD [facility]  [address] [place], [place] [address] Phone: [phone] Fax : [phone]  Consultation  Referring Provider:     Dr. Daisey Dryer Primary Care Physician:  Joycelyn Noa Alyn Babe, MD Primary Gastroenterologist:  Dr. Mamie Searles         Reason for Consultation:     Choledocholithiasis  Date of Admission:  01/17/2024 Date of Consultation:  01/17/2024         HPI:   [name] is a 68 y.o. male who has a history of ulcerative colitis with a J-pouch and gallbladder cancer.  The patient also has a history of B-cell lymphoma in remission and type 2 diabetes.  He had colon cancer with resection and has presented with 1 to 2 weeks of generalized abdominal pain.  There was associated mild nausea.  Patient had liver enzymes done that showed:  Component     Latest Ref Rng 04/07/2022  Total Protein     6.5 - 8.1 g/dL 7.4   Albumin     3.5 - 5.0 g/dL 4.1   AST     15 - 41 U/L 34   ALT     0 - 44 U/L 36   Alkaline Phosphatase     38 - 126 U/L 316 (H)   Total Bilirubin     0.0 - 1.2 mg/dL 0.9    Component     Latest Ref Rng 01/17/2024  Total Protein     6.5 - 8.1 g/dL 7.9   Albumin     3.5 - 5.0 g/dL 4.1   AST     15 - 41 U/L 68 (H)   ALT     0 - 44 U/L 61 (H)   Alkaline Phosphatase     38 - 126 U/L 233 (H)   Total Bilirubin     0.0 - 1.2 mg/dL 2.6 (H)    The patient had a CT scan of the abdomen done that showed:  IMPRESSION: Suspect a 6 mm distal CBD stone.  No biliary dilatation.   Chronic findings are stable from 2023 as described.  I have now been asked to see the patient for choledocholithiasis.  Past Medical History:  Diagnosis Date   Cancer (HCC)    Diabetes mellitus without complication [facility])     Past Surgical History:  Procedure Laterality Date   COLON SURGERY     OTHER SURGICAL HISTORY     J pouch 1997, history of ileostomy with reversal   POUCHOSCOPY N/A 04/10/2022   Procedure: POUCHOSCOPY;  Surgeon: Quintin Buckle, DO;  Location: [facility] ENDOSCOPY;  Service: Gastroenterology;  Laterality: N/A;    Prior to Admission medications   Medication Sig Start Date End Date Taking? Authorizing Provider  albuterol  (VENTOLIN  HFA) 108 (90 Base) MCG/ACT inhaler Inhale 2 puffs into the lungs every 2 (two) hours as needed for wheezing or shortness of breath. 07/27/22   Alexander, Natalie, DO  cyclobenzaprine  (FLEXERIL ) 10 MG tablet Take 1 tablet (10 mg total) by mouth 3 (three) times daily as needed for muscle spasms. 07/27/22   Alexander, Natalie, DO  JARDIANCE  10 MG TABS tablet Take 10 mg by mouth daily. 03/28/22   [provider]  LANTUS  SOLOSTAR 100 UNIT/ML Solostar Pen Inject 35 Units into the skin at bedtime. 09/29/19   [provider]  LORazepam  (ATIVAN ) 0.5 MG tablet Take 0.5 mg by mouth daily as needed.    [provider]  metFORMIN  (GLUCOPHAGE )  1000 MG tablet Take 1,000 mg by mouth 2 (two) times daily. 04/08/17   [provider]  mupirocin  ointment (BACTROBAN ) 2 % Apply 1 Application topically daily.    [provider]  OLANZapine  (ZYPREXA ) 5 MG tablet Take 1.5 tablets (7.5 mg total) by mouth at bedtime. 07/27/22 08/26/22  Alexander, Natalie, DO  omeprazole (PRILOSEC) 40 MG capsule Take 40 mg by mouth daily.     [provider]  venlafaxine  XR (EFFEXOR -XR) 150 MG 24 hr capsule Take 150 mg by mouth daily. 03/20/17   [provider]    Family History  Problem Relation Age of Onset   Colon cancer Sister    Crohn's disease Brother      Social History   Tobacco Use   Smoking status: Never   Smokeless tobacco: Never  Vaping Use   Vaping status: Never Used  Substance Use Topics   Alcohol use: Never   Drug use: Never    Allergies as of 01/17/2024 - Review Complete 01/17/2024  Allergen Reaction Noted   Sulfa antibiotics Rash and Hives 08/18/2013   Oxycodone  Other (See Comments) and Rash 10/22/2013    Review of Systems:    All systems reviewed  and negative except where noted in HPI.   Physical Exam:  Vital signs in last 24 hours: Temp:  [97.4 F (36.3 C)-98.7 F (37.1 C)] 97.4 F (36.3 C) (04/25 0902) Pulse Rate:  [80-109] 83 (04/25 0902) Resp:  [16-18] 18 (04/25 0902) BP: (94-136)/(61-79) 115/79 (04/25 0902) SpO2:  [100 %] 100 % (04/25 0902) Weight:  [76.7 kg] 76.7 kg (04/25 0346)   General:   Pleasant, cooperative in NAD Head:  Normocephalic and atraumatic. Eyes:   No icterus.   Conjunctiva pink. PERRLA. Ears:  Normal auditory acuity. Neck:  Supple; no masses or thyroidomegaly Lungs: Respirations even and unlabored. Lungs clear to auscultation bilaterally.   No wheezes, crackles, or rhonchi.  Heart:  Regular rate and rhythm;  Without murmur, clicks, rubs or gallops Abdomen:  Soft, nondistended, nontender. Normal bowel sounds. No appreciable masses or hepatomegaly.  No rebound or guarding.  Rectal:  Not performed. Msk:  Symmetrical without gross deformities.    Extremities:  Without edema, cyanosis or clubbing. Neurologic:  Alert and oriented x3;  grossly normal neurologically. Skin:  Intact without significant lesions or rashes. Cervical Nodes:  No significant cervical adenopathy. Psych:  Alert and cooperative. Normal affect.  LAB RESULTS: Recent Labs    01/17/24 0027  WBC 7.7  HGB 14.4  HCT 39.3  PLT PLATELET CLUMPS NOTED ON SMEAR, UNABLE TO ESTIMATE   BMET Recent Labs    01/17/24 0027  NA 125*  K 4.7  CL 96*  CO2 19*  GLUCOSE 321*  BUN 36*  CREATININE 2.11*  CALCIUM  9.2   LFT Recent Labs    01/17/24 0027  PROT 7.9  ALBUMIN 4.1  AST 68*  ALT 61*  ALKPHOS 233*  BILITOT 2.6*   PT/INR No results for input(s): "LABPROT", "INR" in the last 72 hours.  STUDIES: DG C-Arm 1-60 Min-No Report Result Date: 01/17/2024 Fluoroscopy was utilized by the requesting physician.  No radiographic interpretation.   CT ABDOMEN PELVIS WO CONTRAST Result Date: 01/17/2024 CLINICAL DATA:  Abdominal pain,  history of gallbladder cancer and resection, in remission. EXAM: CT ABDOMEN AND PELVIS WITHOUT CONTRAST TECHNIQUE: Multidetector CT imaging of the abdomen and pelvis was performed following the standard protocol without IV contrast. RADIATION DOSE REDUCTION: This exam was performed according to the departmental dose-optimization  program which includes automated exposure control, adjustment of the mA and/or kV according to patient size and/or use of iterative reconstruction technique. COMPARISON:  07/26/2022 FINDINGS: Lower chest:  No contributory findings. Hepatobiliary: No focal liver abnormality.Cholecystectomy. No biliary dilatation but there is a calcified density in the region of the distal CBD which measures 6 mm. Pancreas: Generalized atrophy.  No visible inflammation. Spleen: Unremarkable. Adrenals/Urinary Tract: Negative adrenals. No hydronephrosis or stone. Unremarkable bladder. Stomach/Bowel: Colectomy with ileoanal anastomosis. Prominent wall thickness of bowel above the anastomosis is unchanged in without fat inflammation. Scattered small bowel fluid, overall nonobstructive bowel gas pattern. Vascular/Lymphatic: No acute vascular abnormality. No mass or adenopathy. Atheromatous calcifications, mild. Reproductive:Mildly enlarged prostate up lifting the bladder base. Other: No ascites or pneumoperitoneum. Musculoskeletal: No acute abnormalities. Lumbar spine degeneration with mild scoliosis. IMPRESSION: Suspect a 6 mm distal CBD stone.  No biliary dilatation. Chronic findings are stable from 2023 as described. Electronically Signed   By: Ronnette Coke M.D.   On: 01/17/2024 05:50   DG Chest Port 1 View Result Date: 01/17/2024 CLINICAL DATA:  Sepsis EXAM: PORTABLE CHEST 1 VIEW COMPARISON:  07/26/2022 FINDINGS: The heart size and mediastinal contours are within normal limits. Both lungs are clear. The visualized skeletal structures are unremarkable. Dual lumen right chest wall power injectable  Port-A-Cath tip in the proximal right atrium. IMPRESSION: No active disease. Electronically Signed   By: Juanetta Nordmann M.D.   On: 01/17/2024 03:51      Impression / Plan:   Assessment: Principal Problem:   Choledocholithiasis Active Problems:   Essential hypertension   Type II diabetes mellitus with renal manifestations (HCC)   Acute kidney injury superimposed on chronic kidney disease (HCC)   GERD without esophagitis   Pseudohyponatremia   Small cell b-cell lymphoma, in remission   [name] is a 68 y.o. y/o male with CT scan findings of a 6 mm distal common bile duct stone with abnormal liver enzymes.  The patient presented with abdominal pain.  Plan:  Patient will be set up for a ERCP for today.  Patient has been told about the risks and benefits.  He has been told about the risks of pancreatitis, bleeding, perforation, infection and death associated with pancreatitis.  He has been given time to ask questions and all of his questions were answered.  He has agreed to proceed with an ERCP.  Thank you for involving me in the care of this patient.      LOS: 0 days   Marnee Sink, MD, MD. Sylvan Evener 01/17/2024, 11:59 AM,  Pager [phone] 7am-5pm  Check AMION for 5pm -7am coverage and on weekends   Note: This dictation was prepared with Dragon dictation along with smaller phrase technology. Any transcriptional errors that result from this process are unintentional.

## 2024-01-17 NOTE — Anesthesia Postprocedure Evaluation (Signed)
 Anesthesia Post Note  Patient: [name]  Procedure(s) Performed: ERCP, WITH INTERVENTION IF INDICATED  Patient location during evaluation: PACU Anesthesia Type: General Level of consciousness: awake and alert, oriented and patient cooperative Pain management: pain level controlled Vital Signs Assessment: post-procedure vital signs reviewed and stable Respiratory status: spontaneous breathing, nonlabored ventilation and respiratory function stable Cardiovascular status: blood pressure returned to baseline and stable Postop Assessment: adequate PO intake Anesthetic complications: no   No notable events documented.   Last Vitals:  Vitals:   01/17/24 1223 01/17/24 1226  BP:  121/60  Pulse: 81 79  Resp: 13 15  Temp:    SpO2: 100% 100%    Last Pain:  Vitals:   01/17/24 1226  TempSrc:   PainSc: 0-No pain                 Dorothey Gate

## 2024-01-17 NOTE — Assessment & Plan Note (Addendum)
 Followed in the [facility] system

## 2024-01-17 NOTE — Progress Notes (Signed)
 ED Pharmacy Antibiotic Sign Off An antibiotic consult was received from an ED provider for Vancomycin  per pharmacy dosing for sepsis. A chart review was completed to assess appropriateness.   The following one time order(s) were placed:  Vancomycin  1750 mg IV X 1.   Further antibiotic and/or antibiotic pharmacy consults should be ordered by the admitting provider if indicated.   Thank you for allowing pharmacy to be a part of this patient's care.   Alvenia Job [facility]  Clinical Pharmacist 01/17/24 3:04 AM

## 2024-01-18 ENCOUNTER — Encounter: Payer: Self-pay | Admitting: Gastroenterology

## 2024-01-18 DIAGNOSIS — Z794 Long term (current) use of insulin: Secondary | ICD-10-CM

## 2024-01-18 DIAGNOSIS — N1831 Chronic kidney disease, stage 3a: Secondary | ICD-10-CM

## 2024-01-18 DIAGNOSIS — K219 Gastro-esophageal reflux disease without esophagitis: Secondary | ICD-10-CM

## 2024-01-18 DIAGNOSIS — N179 Acute kidney failure, unspecified: Secondary | ICD-10-CM | POA: Diagnosis not present

## 2024-01-18 DIAGNOSIS — K8050 Calculus of bile duct without cholangitis or cholecystitis without obstruction: Secondary | ICD-10-CM | POA: Diagnosis not present

## 2024-01-18 DIAGNOSIS — C830A Small cell b-cell lymphoma, in remission: Secondary | ICD-10-CM

## 2024-01-18 DIAGNOSIS — E1122 Type 2 diabetes mellitus with diabetic chronic kidney disease: Secondary | ICD-10-CM

## 2024-01-18 DIAGNOSIS — R7989 Other specified abnormal findings of blood chemistry: Secondary | ICD-10-CM | POA: Diagnosis not present

## 2024-01-18 LAB — CBC
HCT: 33.4 % — ABNORMAL LOW (ref 39.0–52.0)
Hemoglobin: 12.1 g/dL — ABNORMAL LOW (ref 13.0–17.0)
MCH: 30 pg (ref 26.0–34.0)
MCHC: 36.2 g/dL — ABNORMAL HIGH (ref 30.0–36.0)
MCV: 82.9 fL (ref 80.0–100.0)
Platelets: 141 10*3/uL — ABNORMAL LOW (ref 150–400)
RBC: 4.03 MIL/uL — ABNORMAL LOW (ref 4.22–5.81)
RDW: 12.7 % (ref 11.5–15.5)
WBC: 5.5 10*3/uL (ref 4.0–10.5)
nRBC: 0 % (ref 0.0–0.2)

## 2024-01-18 LAB — GLUCOSE, CAPILLARY
Glucose-Capillary: 109 mg/dL — ABNORMAL HIGH (ref 70–99)
Glucose-Capillary: 108 mg/dL — ABNORMAL HIGH (ref 70–99)
Glucose-Capillary: 291 mg/dL — ABNORMAL HIGH (ref 70–99)
Glucose-Capillary: 175 mg/dL — ABNORMAL HIGH (ref 70–99)

## 2024-01-18 LAB — COMPREHENSIVE METABOLIC PANEL WITH GFR
ALT: 39 U/L (ref 0–44)
AST: 35 U/L (ref 15–41)
Albumin: 3.6 g/dL (ref 3.5–5.0)
Alkaline Phosphatase: 187 U/L — ABNORMAL HIGH (ref 38–126)
Anion gap: 7 (ref 5–15)
BUN: 25 mg/dL — ABNORMAL HIGH (ref 8–23)
CO2: 21 mmol/L — ABNORMAL LOW (ref 22–32)
Calcium: 8.2 mg/dL — ABNORMAL LOW (ref 8.9–10.3)
Chloride: 111 mmol/L (ref 98–111)
Creatinine, Ser: 1.49 mg/dL — ABNORMAL HIGH (ref 0.61–1.24)
GFR, Estimated: 50 mL/min — ABNORMAL LOW (ref >60)
Glucose, Bld: 109 mg/dL — ABNORMAL HIGH (ref 70–99)
Potassium: 3.8 mmol/L (ref 3.5–5.1)
Sodium: 139 mmol/L (ref 135–145)
Total Bilirubin: 1.4 mg/dL — ABNORMAL HIGH (ref 0.0–1.2)
Total Protein: 6.6 g/dL (ref 6.5–8.1)

## 2024-01-18 MED ORDER — INSULIN ASPART 100 UNIT/ML IJ SOLN
0.0000 [IU] | Freq: Three times a day (TID) | INTRAMUSCULAR | Status: DC
  Administered 2024-01-18: 5 [IU] via SUBCUTANEOUS
  Filled 2024-01-18: qty 1

## 2024-01-18 MED ORDER — INSULIN ASPART 100 UNIT/ML IJ SOLN: 0.0000 [IU] | Freq: Every day | INTRAMUSCULAR | Status: DC

## 2024-01-18 MED ORDER — INSULIN GLARGINE-YFGN 100 UNIT/ML ~~LOC~~ SOLN
28.0000 [IU] | Freq: Every day | SUBCUTANEOUS | Status: DC
  Administered 2024-01-18: 28 [IU] via SUBCUTANEOUS
  Filled 2024-01-18: qty 0.28

## 2024-01-18 MED ORDER — AMOXICILLIN-POT CLAVULANATE 500-125 MG PO TABS
1.0000 | ORAL_TABLET | Freq: Two times a day (BID) | ORAL | Status: DC
  Administered 2024-01-18: 1 via ORAL
  Filled 2024-01-18 (×2): qty 1

## 2024-01-18 MED ORDER — AMOXICILLIN-POT CLAVULANATE 500-125 MG PO TABS: 1.0000 | ORAL_TABLET | Freq: Two times a day (BID) | ORAL | 0 refills | Status: AC

## 2024-01-18 MED ORDER — PANTOPRAZOLE SODIUM 40 MG PO TBEC
40.0000 mg | DELAYED_RELEASE_TABLET | Freq: Every day | ORAL | Status: DC
  Administered 2024-01-18: 40 mg via ORAL
  Filled 2024-01-18: qty 1

## 2024-01-18 MED ORDER — VENLAFAXINE HCL ER 75 MG PO CP24
150.0000 mg | ORAL_CAPSULE | Freq: Every day | ORAL | Status: DC
  Administered 2024-01-18: 150 mg via ORAL
  Filled 2024-01-18: qty 2

## 2024-01-18 MED ORDER — LANTUS SOLOSTAR 100 UNIT/ML ~~LOC~~ SOPN: 28.0000 [IU] | PEN_INJECTOR | Freq: Every day | SUBCUTANEOUS | Status: AC

## 2024-01-18 NOTE — Discharge Summary (Signed)
 Physician Discharge Summary   Patient: [name] MRN: [id number] DOB: [birth year]  Admit date:     01/17/2024  Discharge date: 01/18/24  Discharge Physician: Verla Glaze   PCP: Kenton Pearl, MD   Recommendations at discharge:   Follow-up with PCP  Discharge Diagnoses: Principal Problem:   Choledocholithiasis Active Problems:   Acute kidney injury superimposed on chronic kidney disease (HCC)   Type II diabetes mellitus with renal manifestations (HCC)   GERD without esophagitis   Pseudohyponatremia   Small cell b-cell lymphoma, in remission   Hospital Course: 68 y.o. male with medical history significant of gallbladder cancer status post cholecystectomy, B-cell lymphoma in remission, poorly controlled type 2 diabetes, history of colon cancer s/p resection presenting with choledocholithiasis, acute on chronic kidney, hyperglycemia.  Patient reports roughly 1 to 2 weeks of generalized abdominal pain.  Abdominal pain mild to moderate in intensity.  Minimal nausea.  No chest pain or shortness of breath.  Patient reports chronic diarrhea.  No reported black or bilious stools.  Non-smoker.  Denies any alcohol or illicit drug use.  Gets care predominantly in the [facility] system.  Mild difficulty with ambulation chronically. Presented to the ER afebrile, hemodynamically stable.  Satting well on room air.  White count 7.7, hemoglobin 14.4.  Creatinine 2.1, glucose 321, AST 68, ALT 61, T. Bili 2.6.  CT abdomen pelvis with noted 6 mm common bile duct stone.  Per Dr. Ashok Laws, case discussed with Dr. Mamie Searles overnight who will coordinate with Dr. Ole Berkeley for ERCP.  4/25.  ERCP was done by Dr. Ole Berkeley.  Choledocholithiasis was found.  Complete removal was accomplished by biliary sphincterotomy and balloon extraction. 4/26.  Patient tolerated advanced diet without pain.  Patient was discharged home.  Total bilirubin went down from 2.6 to 1.4.  Alkaline phosphatase went down from 233 to 187.  AST went  down from 68 to 35.  ALT went down from 61 to 39.  Assessment and Plan: * Choledocholithiasis History of GB cancer s/p cholecystectomy Patient did well status post ERCP with biliary sphincterotomy and stone removal.  Patient tolerated advance diet and was discharged home in stable condition.  Patient given Augmentin  upon discharge.   Acute kidney injury superimposed on chronic kidney disease (HCC) Creatinine 2.1 upon admission and 1.49 upon discharge.  Patient has underlying chronic kidney disease stage IIIa.  Type II diabetes mellitus with renal manifestations (HCC) Hemoglobin A1c elevated 9.4.  Patient has underlying chronic kidney disease stage IIIa.  Patient's sugars were on the lower side but then went up prior to lunch.  Restarted his long-acting insulin  which he will do at 12 noon tomorrow.  Can restart his Jardiance .  Hold metformin  for now.  Small cell b-cell lymphoma, in remission Followed in the [facility] system    Pseudohyponatremia Sodium normal range upon discharge  GERD without esophagitis PPI         Consultants: Gastroenterology Procedures performed: ERCP Disposition: Home Diet recommendation:  Low-fat diet carb modified DISCHARGE MEDICATION: Allergies as of 01/18/2024       Reactions   Sulfa Antibiotics Rash, Hives   Oxycodone  Other (See Comments), Rash   Rash and headache        Medication List     STOP taking these medications    metFORMIN  1000 MG tablet Commonly known as: GLUCOPHAGE        TAKE these medications    albuterol  108 (90 Base) MCG/ACT inhaler Commonly known as: VENTOLIN  HFA Inhale 2 puffs into the lungs every  2 (two) hours as needed for wheezing or shortness of breath.   amoxicillin -clavulanate 500-125 MG tablet Commonly known as: AUGMENTIN  Take 1 tablet by mouth 2 (two) times daily for 9 doses.   cyclobenzaprine  10 MG tablet Commonly known as: FLEXERIL  Take 1 tablet (10 mg total) by mouth 3 (three) times daily as needed  for muscle spasms.   Jardiance  10 MG Tabs tablet Generic drug: empagliflozin  Take 10 mg by mouth daily.   Lantus  SoloStar 100 UNIT/ML Solostar Pen Generic drug: insulin  glargine Inject 28 Units into the skin daily. At 12 noon What changed:  how much to take when to take this additional instructions   LORazepam  0.5 MG tablet Commonly known as: ATIVAN  Take 0.5 mg by mouth daily as needed.   mupirocin  ointment 2 % Commonly known as: BACTROBAN  Apply 1 Application topically daily.   OLANZapine  5 MG tablet Commonly known as: ZyPREXA  Take 1.5 tablets (7.5 mg total) by mouth at bedtime.   omeprazole 40 MG capsule Commonly known as: PRILOSEC Take 40 mg by mouth daily.   venlafaxine  XR 150 MG 24 hr capsule Commonly known as: EFFEXOR -XR Take 150 mg by mouth daily.        Follow-up Information     Revelo, Adrian Mancheno, MD Follow up in 5 day(s).   Specialty: Family Medicine Contact information: [address] [place] [address] [phone]                Discharge Exam: [name] Weights   01/17/24 0022 01/17/24 0346  Weight: 76.7 kg 76.7 kg   Physical Exam HENT:     Head: Normocephalic.     Mouth/Throat:     Pharynx: No oropharyngeal exudate.  Eyes:     General: Lids are normal.     Conjunctiva/sclera: Conjunctivae normal.  Cardiovascular:     Rate and Rhythm: Normal rate and regular rhythm.     Heart sounds: Normal heart sounds, S1 normal and S2 normal.  Pulmonary:     Breath sounds: No decreased breath sounds, wheezing, rhonchi or rales.  Abdominal:     Palpations: Abdomen is soft.     Tenderness: There is no abdominal tenderness.  Musculoskeletal:     Right lower leg: No swelling.     Left lower leg: No swelling.  Skin:    General: Skin is warm.     Findings: No rash.  Neurological:     Mental Status: He is alert and oriented to person, place, and time.      Condition at discharge: stable  The results of significant diagnostics  from this hospitalization (including imaging, microbiology, ancillary and laboratory) are listed below for reference.   Imaging Studies: DG C-Arm 1-60 Min-No Report Result Date: 01/17/2024 Fluoroscopy was utilized by the requesting physician.  No radiographic interpretation.   CT ABDOMEN PELVIS WO CONTRAST Result Date: 01/17/2024 CLINICAL DATA:  Abdominal pain, history of gallbladder cancer and resection, in remission. EXAM: CT ABDOMEN AND PELVIS WITHOUT CONTRAST TECHNIQUE: Multidetector CT imaging of the abdomen and pelvis was performed following the standard protocol without IV contrast. RADIATION DOSE REDUCTION: This exam was performed according to the departmental dose-optimization program which includes automated exposure control, adjustment of the mA and/or kV according to patient size and/or use of iterative reconstruction technique. COMPARISON:  07/26/2022 FINDINGS: Lower chest:  No contributory findings. Hepatobiliary: No focal liver abnormality.Cholecystectomy. No biliary dilatation but there is a calcified density in the region of the distal CBD which measures 6 mm. Pancreas: Generalized  atrophy.  No visible inflammation. Spleen: Unremarkable. Adrenals/Urinary Tract: Negative adrenals. No hydronephrosis or stone. Unremarkable bladder. Stomach/Bowel: Colectomy with ileoanal anastomosis. Prominent wall thickness of bowel above the anastomosis is unchanged in without fat inflammation. Scattered small bowel fluid, overall nonobstructive bowel gas pattern. Vascular/Lymphatic: No acute vascular abnormality. No mass or adenopathy. Atheromatous calcifications, mild. Reproductive:Mildly enlarged prostate up lifting the bladder base. Other: No ascites or pneumoperitoneum. Musculoskeletal: No acute abnormalities. Lumbar spine degeneration with mild scoliosis. IMPRESSION: Suspect a 6 mm distal CBD stone.  No biliary dilatation. Chronic findings are stable from 2023 as described. Electronically Signed   By:  Ronnette Coke M.D.   On: 01/17/2024 05:50   DG Chest Port 1 View Result Date: 01/17/2024 CLINICAL DATA:  Sepsis EXAM: PORTABLE CHEST 1 VIEW COMPARISON:  07/26/2022 FINDINGS: The heart size and mediastinal contours are within normal limits. Both lungs are clear. The visualized skeletal structures are unremarkable. Dual lumen right chest wall power injectable Port-A-Cath tip in the proximal right atrium. IMPRESSION: No active disease. Electronically Signed   By: Juanetta Nordmann M.D.   On: 01/17/2024 03:51    Microbiology: Results for orders placed or performed during the hospital encounter of 01/17/24  Blood Culture (routine x 2)     Status: None (Preliminary result)   Collection Time: 01/17/24  4:05 AM   Specimen: BLOOD LEFT ARM  Result Value Ref Range Status   Specimen Description BLOOD LEFT ARM  Final   Special Requests   Final    BOTTLES DRAWN AEROBIC AND ANAEROBIC Blood Culture results may not be optimal due to an inadequate volume of blood received in culture bottles   Culture   Final    NO GROWTH 1 DAY Performed at [facility], [address] [place], [place] [address]    Report Status PENDING  Incomplete  Blood Culture (routine x 2)     Status: None (Preliminary result)   Collection Time: 01/17/24  4:05 AM   Specimen: BLOOD RIGHT ARM  Result Value Ref Range Status   Specimen Description BLOOD RIGHT ARM  Final   Special Requests   Final    BOTTLES DRAWN AEROBIC AND ANAEROBIC Blood Culture results may not be optimal due to an inadequate volume of blood received in culture bottles   Culture   Final    NO GROWTH 1 DAY Performed at [facility], [address] [place], [place] [address]    Report Status PENDING  Incomplete    Labs: CBC: Recent Labs  Lab 01/17/24 0027 01/18/24 0434  WBC 7.7 5.5  NEUTROABS 6.3  --   HGB 14.4 12.1*  HCT 39.3 33.4*  MCV 80.9 82.9  PLT PLATELET CLUMPS NOTED ON SMEAR, UNABLE TO ESTIMATE 141*   Basic Metabolic  Panel: Recent Labs  Lab 01/17/24 0027 01/17/24 1845 01/18/24 0434  NA 125* 137 139  K 4.7 4.2 3.8  CL 96* 109 111  CO2 19* 21* 21*  GLUCOSE 321* 222* 109*  BUN 36* 29* 25*  CREATININE 2.11* 1.74* 1.49*  CALCIUM  9.2 8.3* 8.2*   Liver Function Tests: Recent Labs  Lab 01/17/24 0027 01/18/24 0434  AST 68* 35  ALT 61* 39  ALKPHOS 233* 187*  BILITOT 2.6* 1.4*  PROT 7.9 6.6  ALBUMIN 4.1 3.6   CBG: Recent Labs  Lab 01/17/24 2334 01/18/24 0314 01/18/24 0906 01/18/24 1142 01/18/24 1601  GLUCAP 97 109* 108* 291* 175*    Discharge time spent: greater than 30 minutes.  Signed: Rich Champ  Clelia Current, MD [facility] 01/18/2024

## 2024-01-18 NOTE — Hospital Course (Signed)
 68 y.o. male with medical history significant of gallbladder cancer status post cholecystectomy, B-cell lymphoma in remission, poorly controlled type 2 diabetes, history of colon cancer s/p resection presenting with choledocholithiasis, acute on chronic kidney, hyperglycemia.  Patient reports roughly 1 to 2 weeks of generalized abdominal pain.  Abdominal pain mild to moderate in intensity.  Minimal nausea.  No chest pain or shortness of breath.  Patient reports chronic diarrhea.  No reported black or bilious stools.  Non-smoker.  Denies any alcohol or illicit drug use.  Gets care predominantly in the [facility] system.  Mild difficulty with ambulation chronically. Presented to the ER afebrile, hemodynamically stable.  Satting well on room air.  White count 7.7, hemoglobin 14.4.  Creatinine 2.1, glucose 321, AST 68, ALT 61, T. Bili 2.6.  CT abdomen pelvis with noted 6 mm common bile duct stone.  Per Dr. Ashok Laws, case discussed with Dr. Mamie Searles overnight who will coordinate with Dr. Ole Berkeley for ERCP.  4/25.  ERCP was done by Dr. Ole Berkeley.  Choledocholithiasis was found.  Complete removal was accomplished by biliary sphincterotomy and balloon extraction. 4/26.  Patient tolerated advanced diet without pain.  Patient was discharged home.  Total bilirubin went down from 2.6 to 1.4.  Alkaline phosphatase went down from 233 to 187.  AST went down from 68 to 35.  ALT went down from 61 to 39.

## 2024-01-18 NOTE — TOC CM/SW Note (Signed)
 Transition of Care [facility]) - Inpatient Brief Assessment   Patient Details  Name: Akai Weatherby MRN: [id number] Date of Birth: 07-17-1956  [facility] CM/SW Contact:    Odilia Bennett, LCSW Phone Number: 01/18/2024, 11:22 AM   Clinical Narrative: Patient has orders to discharge home today. Chart reviewed. No TOC needs identified. CSW signing off.  Transition of Care Asessment: Insurance and Status: Insurance coverage has been reviewed Patient has primary care physician: Yes Home environment has been reviewed: Single family home Prior level of function:: Independent Prior/Current Home Services: No current home services Social Drivers of Health Review: SDOH reviewed no interventions necessary Readmission risk has been reviewed: Yes Transition of care needs: no transition of care needs at this time

## 2024-01-18 NOTE — Care Management Obs Status (Signed)
 MEDICARE OBSERVATION STATUS NOTIFICATION   Patient Details  Name: [name] MRN: [id number] Date of Birth: [birth year]   Medicare Observation Status Notification Given:  Yes    Odilia Bennett, LCSW 01/18/2024, 11:21 AM

## 2024-01-18 NOTE — Plan of Care (Signed)

## 2024-01-18 NOTE — Care Plan (Signed)
 Patient with no significant pain. GI will sign-off  Olivia Bevel MD, MPH

## 2024-01-18 NOTE — TOC CM/SW Note (Signed)
 CSW went by room to give MOON. Patient not in the room. Will try again later.  Duke Gibbons, CSW [phone]

## 2024-01-22 ENCOUNTER — Encounter (INDEPENDENT_AMBULATORY_CARE_PROVIDER_SITE_OTHER): Payer: Self-pay | Admitting: Nurse Practitioner

## 2024-01-22 LAB — CULTURE, BLOOD (ROUTINE X 2)
Culture: NO GROWTH
Culture: NO GROWTH

## 2024-01-27 DIAGNOSIS — C23 Malignant neoplasm of gallbladder: Principal | ICD-10-CM

## 2024-02-06 ENCOUNTER — Ambulatory Visit: Admit: 2024-02-06 | Discharge: 2024-02-06 | Payer: PRIVATE HEALTH INSURANCE

## 2024-02-06 DIAGNOSIS — C8338 Diffuse large B-cell lymphoma, lymph nodes of multiple sites: Principal | ICD-10-CM

## 2024-02-06 DIAGNOSIS — C23 Malignant neoplasm of gallbladder: Principal | ICD-10-CM

## 2024-02-11 ENCOUNTER — Ambulatory Visit
Admit: 2024-02-11 | Discharge: 2024-02-12 | Payer: PRIVATE HEALTH INSURANCE | Attending: "Endocrinology | Primary: "Endocrinology

## 2024-02-11 DIAGNOSIS — Z794 Long term (current) use of insulin: Principal | ICD-10-CM

## 2024-02-11 DIAGNOSIS — C23 Malignant neoplasm of gallbladder: Principal | ICD-10-CM

## 2024-02-11 DIAGNOSIS — E1165 Type 2 diabetes mellitus with hyperglycemia: Principal | ICD-10-CM

## 2024-02-11 MED ORDER — INSULIN GLARGINE (U-100) 100 UNIT/ML (3 ML) SUBCUTANEOUS PEN
Freq: Every evening | SUBCUTANEOUS | 3 refills | 109.00000 days | Status: CP
Start: 2024-02-11 — End: ?

## 2024-02-11 MED ORDER — EMPAGLIFLOZIN 10 MG TABLET
ORAL_TABLET | Freq: Every day | ORAL | 3 refills | 90.00000 days | Status: CP
Start: 2024-02-11 — End: ?

## 2024-03-04 ENCOUNTER — Ambulatory Visit: Admit: 2024-03-04 | Payer: PRIVATE HEALTH INSURANCE

## 2024-03-10 ENCOUNTER — Emergency Department

## 2024-03-10 ENCOUNTER — Other Ambulatory Visit: Payer: Self-pay

## 2024-03-10 ENCOUNTER — Observation Stay
Admission: EM | Admit: 2024-03-10 | Discharge: 2024-03-11 | Disposition: A | Discharge status: Home / Self Care | Attending: Internal Medicine | Admitting: Internal Medicine

## 2024-03-10 DIAGNOSIS — E1165 Type 2 diabetes mellitus with hyperglycemia: Secondary | ICD-10-CM | POA: Insufficient documentation

## 2024-03-10 DIAGNOSIS — Z794 Long term (current) use of insulin: Secondary | ICD-10-CM | POA: Insufficient documentation

## 2024-03-10 DIAGNOSIS — I129 Hypertensive chronic kidney disease with stage 1 through stage 4 chronic kidney disease, or unspecified chronic kidney disease: Secondary | ICD-10-CM | POA: Diagnosis not present

## 2024-03-10 DIAGNOSIS — L03211 Cellulitis of face: Secondary | ICD-10-CM | POA: Diagnosis not present

## 2024-03-10 DIAGNOSIS — E1129 Type 2 diabetes mellitus with other diabetic kidney complication: Secondary | ICD-10-CM | POA: Diagnosis not present

## 2024-03-10 DIAGNOSIS — N1831 Chronic kidney disease, stage 3a: Secondary | ICD-10-CM | POA: Insufficient documentation

## 2024-03-10 DIAGNOSIS — R739 Hyperglycemia, unspecified: Secondary | ICD-10-CM

## 2024-03-10 DIAGNOSIS — E871 Hypo-osmolality and hyponatremia: Secondary | ICD-10-CM | POA: Insufficient documentation

## 2024-03-10 DIAGNOSIS — C830A Small cell b-cell lymphoma, in remission: Secondary | ICD-10-CM | POA: Diagnosis not present

## 2024-03-10 DIAGNOSIS — L0390 Cellulitis, unspecified: Secondary | ICD-10-CM | POA: Diagnosis present

## 2024-03-10 DIAGNOSIS — R220 Localized swelling, mass and lump, head: Secondary | ICD-10-CM | POA: Diagnosis present

## 2024-03-10 LAB — CBC
HCT: 37.6 % — ABNORMAL LOW (ref 39.0–52.0)
HCT: 35.2 % — ABNORMAL LOW (ref 39.0–52.0)
Hemoglobin: 13.5 g/dL (ref 13.0–17.0)
Hemoglobin: 12.3 g/dL — ABNORMAL LOW (ref 13.0–17.0)
MCH: 29.3 pg (ref 26.0–34.0)
MCH: 28.4 pg (ref 26.0–34.0)
MCHC: 35.9 g/dL (ref 30.0–36.0)
MCHC: 34.9 g/dL (ref 30.0–36.0)
MCV: 81.6 fL (ref 80.0–100.0)
MCV: 81.3 fL (ref 80.0–100.0)
Platelets: 154 10*3/uL (ref 150–400)
Platelets: 142 10*3/uL — ABNORMAL LOW (ref 150–400)
RBC: 4.61 MIL/uL (ref 4.22–5.81)
RBC: 4.33 MIL/uL (ref 4.22–5.81)
RDW: 11.9 % (ref 11.5–15.5)
RDW: 11.8 % (ref 11.5–15.5)
WBC: 7.8 10*3/uL (ref 4.0–10.5)
WBC: 8.4 10*3/uL (ref 4.0–10.5)
nRBC: 0 % (ref 0.0–0.2)
nRBC: 0 % (ref 0.0–0.2)

## 2024-03-10 LAB — CBG MONITORING, ED
Glucose-Capillary: 159 mg/dL — ABNORMAL HIGH (ref 70–99)
Glucose-Capillary: 364 mg/dL — ABNORMAL HIGH (ref 70–99)

## 2024-03-10 LAB — BASIC METABOLIC PANEL WITH GFR
Anion gap: 6 (ref 5–15)
BUN: 25 mg/dL — ABNORMAL HIGH (ref 8–23)
CO2: 26 mmol/L (ref 22–32)
Calcium: 8.9 mg/dL (ref 8.9–10.3)
Chloride: 94 mmol/L — ABNORMAL LOW (ref 98–111)
Creatinine, Ser: 1.74 mg/dL — ABNORMAL HIGH (ref 0.61–1.24)
GFR, Estimated: 42 mL/min — ABNORMAL LOW (ref >60)
Glucose, Bld: 585 mg/dL (ref 70–99)
Potassium: 4.3 mmol/L (ref 3.5–5.1)
Sodium: 126 mmol/L — ABNORMAL LOW (ref 135–145)

## 2024-03-10 LAB — CREATININE, SERUM
Creatinine, Ser: 1.47 mg/dL — ABNORMAL HIGH (ref 0.61–1.24)
GFR, Estimated: 51 mL/min — ABNORMAL LOW (ref >60)

## 2024-03-10 LAB — LACTIC ACID, PLASMA: Lactic Acid, Venous: 1.2 mmol/L (ref 0.5–1.9)

## 2024-03-10 LAB — GLUCOSE, CAPILLARY: Glucose-Capillary: 209 mg/dL — ABNORMAL HIGH (ref 70–99)

## 2024-03-10 MED ORDER — SODIUM CHLORIDE 0.9 % IV SOLN
3.0000 g | Freq: Once | INTRAVENOUS | Status: AC
  Administered 2024-03-10: 3 g via INTRAVENOUS
  Filled 2024-03-10: qty 8

## 2024-03-10 MED ORDER — POLYETHYLENE GLYCOL 3350 17 G PO PACK: 17.0000 g | PACK | Freq: Every day | ORAL | Status: DC | PRN

## 2024-03-10 MED ORDER — ACETAMINOPHEN 325 MG PO TABS: 650.0000 mg | ORAL_TABLET | Freq: Four times a day (QID) | ORAL | Status: DC | PRN

## 2024-03-10 MED ORDER — INSULIN ASPART 100 UNIT/ML IJ SOLN
0.0000 [IU] | Freq: Every day | INTRAMUSCULAR | Status: DC
  Administered 2024-03-10: 2 [IU] via SUBCUTANEOUS
  Filled 2024-03-10: qty 1

## 2024-03-10 MED ORDER — ENOXAPARIN SODIUM 40 MG/0.4ML IJ SOSY
40.0000 mg | PREFILLED_SYRINGE | INTRAMUSCULAR | Status: DC
  Administered 2024-03-10: 40 mg via SUBCUTANEOUS
  Filled 2024-03-10: qty 0.4

## 2024-03-10 MED ORDER — PANTOPRAZOLE SODIUM 40 MG PO TBEC
40.0000 mg | DELAYED_RELEASE_TABLET | Freq: Every day | ORAL | Status: DC
  Administered 2024-03-10 – 2024-03-11 (×2): 40 mg via ORAL
  Filled 2024-03-10 (×2): qty 1

## 2024-03-10 MED ORDER — OLANZAPINE 7.5 MG PO TABS
7.5000 mg | ORAL_TABLET | Freq: Every day | ORAL | Status: DC
  Administered 2024-03-10: 7.5 mg via ORAL
  Filled 2024-03-10 (×2): qty 1

## 2024-03-10 MED ORDER — VENLAFAXINE HCL ER 75 MG PO CP24
150.0000 mg | ORAL_CAPSULE | Freq: Every day | ORAL | Status: DC
  Administered 2024-03-10 – 2024-03-11 (×2): 150 mg via ORAL
  Filled 2024-03-10: qty 1
  Filled 2024-03-10: qty 2

## 2024-03-10 MED ORDER — ALBUTEROL SULFATE (2.5 MG/3ML) 0.083% IN NEBU: 3.0000 mL | INHALATION_SOLUTION | RESPIRATORY_TRACT | Status: DC | PRN

## 2024-03-10 MED ORDER — ONDANSETRON HCL 4 MG/2ML IJ SOLN: 4.0000 mg | Freq: Four times a day (QID) | INTRAMUSCULAR | Status: DC | PRN

## 2024-03-10 MED ORDER — IOHEXOL 300 MG/ML  SOLN
60.0000 mL | Freq: Once | INTRAMUSCULAR | Status: AC | PRN
Start: 2024-03-10 — End: 2024-03-10
  Administered 2024-03-10: 60 mL via INTRAVENOUS

## 2024-03-10 MED ORDER — SODIUM CHLORIDE 0.9 % IV BOLUS
1000.0000 mL | Freq: Once | INTRAVENOUS | Status: AC
  Administered 2024-03-10: 1000 mL via INTRAVENOUS

## 2024-03-10 MED ORDER — ENSURE PLUS HIGH PROTEIN PO LIQD
237.0000 mL | Freq: Two times a day (BID) | ORAL | Status: DC
  Administered 2024-03-11: 237 mL via ORAL

## 2024-03-10 MED ORDER — OXYCODONE HCL 5 MG PO TABS
5.0000 mg | ORAL_TABLET | Freq: Four times a day (QID) | ORAL | Status: DC | PRN
  Administered 2024-03-10 – 2024-03-11 (×2): 5 mg via ORAL
  Filled 2024-03-10 (×2): qty 1

## 2024-03-10 MED ORDER — INSULIN GLARGINE-YFGN 100 UNIT/ML ~~LOC~~ SOLN
28.0000 [IU] | Freq: Every day | SUBCUTANEOUS | Status: DC
  Administered 2024-03-10: 28 [IU] via SUBCUTANEOUS
  Filled 2024-03-10 (×2): qty 0.28

## 2024-03-10 MED ORDER — CEFAZOLIN SODIUM-DEXTROSE 2-4 GM/100ML-% IV SOLN
2.0000 g | Freq: Three times a day (TID) | INTRAVENOUS | Status: DC
  Administered 2024-03-10 – 2024-03-11 (×2): 2 g via INTRAVENOUS
  Filled 2024-03-10 (×4): qty 100

## 2024-03-10 MED ORDER — INSULIN ASPART 100 UNIT/ML IJ SOLN
10.0000 [IU] | Freq: Once | INTRAMUSCULAR | Status: AC
  Administered 2024-03-10: 10 [IU] via INTRAVENOUS
  Filled 2024-03-10: qty 1

## 2024-03-10 MED ORDER — INSULIN ASPART 100 UNIT/ML IJ SOLN
0.0000 [IU] | Freq: Three times a day (TID) | INTRAMUSCULAR | Status: DC
  Administered 2024-03-10: 9 [IU] via SUBCUTANEOUS
  Administered 2024-03-11: 7 [IU] via SUBCUTANEOUS
  Administered 2024-03-11: 2 [IU] via SUBCUTANEOUS
  Filled 2024-03-10 (×3): qty 1

## 2024-03-10 NOTE — ED Notes (Signed)
 Glucose 585 informed to MD Karlynn Oyster

## 2024-03-10 NOTE — ED Provider Notes (Signed)
 [facility] Provider Note    Event Date/Time   First MD Initiated Contact with Patient 03/10/24 1200     (approximate)   History   Facial Swelling   HPI  [name] is a 68 y.o. male with a past medical history of uncontrolled type 2 diabetes, CKD, anxiety, depression who presents today for evaluation of facial swelling.  Patient reports that he cut himself shaving approximately 5 days ago but the swelling has worsened over the past 3 days.  He thinks that his wife gave him an antibiotic but he is not sure which one.  He reports that the swelling has worsened and has been since spread to the left side of his face and down his neck area.  He also has pain inside his mouth.  He has pain with opening his mouth.  No drooling.  No difficulty breathing.  No fevers but does report that he has had chills.  Patient Active Problem List   Diagnosis Date Noted   Cellulitis 03/10/2024   Choledocholithiasis 01/17/2024   Pseudohyponatremia 01/17/2024   Small cell b-cell lymphoma, in remission 01/17/2024   Uncontrolled type 2 diabetes mellitus with hyperglycemia, with long-term current use of insulin  (HCC) 07/27/2022   Acute kidney injury superimposed on chronic kidney disease (HCC) 07/27/2022   GERD without esophagitis 07/27/2022   Anxiety and depression 07/27/2022   Multifocal pneumonia 07/27/2022   Sepsis due to pneumonia (HCC) 07/26/2022   Acute renal failure superimposed on stage 3a chronic kidney disease (HCC) 04/07/2022   Type II diabetes mellitus with renal manifestations (HCC) 04/07/2022   Insomnia 12/01/2020   Adjustment disorder with anxiety 12/01/2020   Chronic venous insufficiency 05/10/2020   Hyponatremia 12/05/2019   Diarrhea 12/05/2019   Shingles 12/05/2019   Dehydration 12/04/2019   Post traumatic stress disorder (PTSD) 10/07/2018   Right knee pain 05/27/2018   Hypoglycemia 05/27/2017   TBI (traumatic brain injury) (HCC) 04/02/2017    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   Uncontrolled type 2 diabetes mellitus 08/08/2016   Onychomycosis 09/12/2015   Panic attack 08/24/2015   Pouchitis (HCC) 09/01/2014   Periodic limb movement disorder (PLMD) 10/13/2013   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   Vocal process granuloma 10/06/2013   UC (ulcerative colitis) (HCC) 08/18/2013   Obstructive sleep apnea 01/29/2012   Posterior subcapsular polar senile cataract 12/31/2011   Status post intestinal bypass or anastomosis 09/28/2011          Physical Exam   Triage Vital Signs: ED Triage Vitals  Encounter Vitals Group     BP 03/10/24 1122 (!) 161/95     Girls Systolic BP Percentile --      Girls Diastolic BP Percentile --      Boys Systolic BP Percentile --      Boys Diastolic BP Percentile --      Pulse Rate 03/10/24 1122 (!) 108     Resp 03/10/24 1122 18     Temp 03/10/24 1122 98.2 F (36.8 C)     Temp src --      SpO2 03/10/24 1122 100 %     Weight 03/10/24 1121 165 lb (74.8 kg)     Height 03/10/24 1121 5' 10 (1.778 m)     Head Circumference --      Peak Flow --      Pain Score 03/10/24 1121 8     Pain Loc --      Pain Education --      Exclude from [employer]  Chart --     Most recent vital signs: Vitals:   03/10/24 1122  BP: (!) 161/95  Pulse: (!) 108  Resp: 18  Temp: 98.2 F (36.8 C)  SpO2: 100%    Physical Exam Vitals and nursing note reviewed.  Constitutional:      General: Awake and alert. No acute distress.    Appearance: Normal appearance. The patient is normal weight.  HENT:     Head: Normocephalic.  Golf ball sized area of swelling to his chin with spontaneous purulent drainage and overlying erythema.  Tenderness and erythema extending laterally to his left side of his face and down his left neck.  Fullness noted to the submental area without fluctuance.  No sublingual swelling or woodiness. 3 finger trismus.  No drooling or voice change.  No nuchal rigidity.    Mouth: Mucous membranes are  moist.  Eyes:     General: PERRL. Normal EOMs        Right eye: No discharge.        Left eye: No discharge.     Conjunctiva/sclera: Conjunctivae normal.  Cardiovascular:     Rate and Rhythm: Tachycardic rate and regular rhythm.     Pulses: Normal pulses.  Pulmonary:     Effort: Pulmonary effort is normal. No respiratory distress.     Breath sounds: Normal breath sounds.  Abdominal:     Abdomen is soft. There is no abdominal tenderness. No rebound or guarding. No distention. Musculoskeletal:        General: No swelling. Normal range of motion.     Cervical back: Normal range of motion and neck supple.  Skin:    General: Skin is warm and dry.     Capillary Refill: Capillary refill takes less than 2 seconds.     Findings: No rash.  Neurological:     Mental Status: The patient is awake and alert.      ED Results / Procedures / Treatments   Labs (all labs ordered are listed, but only abnormal results are displayed) Labs Reviewed  CBC - Abnormal; Notable for the following components:      Result Value   HCT 37.6 (*)    All other components within normal limits  BASIC METABOLIC PANEL WITH GFR - Abnormal; Notable for the following components:   Sodium 126 (*)    Chloride 94 (*)    Glucose, Bld 585 (*)    BUN 25 (*)    Creatinine, Ser 1.74 (*)    GFR, Estimated 42 (*)    All other components within normal limits  CBC - Abnormal; Notable for the following components:   Hemoglobin 12.3 (*)    HCT 35.2 (*)    Platelets 142 (*)    All other components within normal limits  CBG MONITORING, ED - Abnormal; Notable for the following components:   Glucose-Capillary 159 (*)    All other components within normal limits  MRSA NEXT GEN BY PCR, NASAL  LACTIC ACID, PLASMA  CREATININE, SERUM     EKG     RADIOLOGY I independently reviewed and interpreted imaging and agree with radiologists findings.     PROCEDURES:  Critical Care performed:   Procedures   MEDICATIONS  ORDERED IN ED: Medications  ceFAZolin (ANCEF) IVPB 2g/100 mL premix (has no administration in time range)  enoxaparin  (LOVENOX ) injection 40 mg (has no administration in time range)  insulin  glargine-yfgn (SEMGLEE ) injection 28 Units (has no administration in time range)  OLANZapine  (ZYPREXA ) tablet  7.5 mg (has no administration in time range)  venlafaxine  XR (EFFEXOR -XR) 24 hr capsule 150 mg (has no administration in time range)  albuterol  (PROVENTIL ) (2.5 MG/3ML) 0.083% nebulizer solution 3 mL (has no administration in time range)  acetaminophen  (TYLENOL ) tablet 650 mg (has no administration in time range)  ondansetron  (ZOFRAN ) injection 4 mg (has no administration in time range)  polyethylene glycol (MIRALAX / GLYCOLAX) packet 17 g (has no administration in time range)  oxyCODONE  (Oxy IR/ROXICODONE ) immediate release tablet 5 mg (has no administration in time range)  Ampicillin-Sulbactam (UNASYN) 3 g in sodium chloride  0.9 % 100 mL IVPB (0 g Intravenous Stopped 03/10/24 1314)  sodium chloride  0.9 % bolus 1,000 mL (0 mLs Intravenous Stopped 03/10/24 1408)  insulin  aspart (novoLOG ) injection 10 Units (10 Units Intravenous Given 03/10/24 1225)  iohexol  (OMNIPAQUE ) 300 MG/ML solution 60 mL (60 mLs Intravenous Contrast Given 03/10/24 1246)     IMPRESSION / MDM / ASSESSMENT AND PLAN / ED COURSE  I reviewed the triage vital signs and the nursing notes.   Differential diagnosis includes, but is not limited to, abscess, cellulitis, deep space infection, hypoglycemia, sepsis.  Patient is awake and alert, tachycardic to 108 on arrival to normotensive and afebrile.  He has an obvious area of fluctuance with spontaneous drainage to his chin, though he has tenderness extending laterally and inferiorly.  No sublingual swelling to suggest Ludwig's angina.  He does have 3 finger trismus due to pain.  No drooling.  No voice change.  Labs obtained in triage reveal no leukocytosis.  However he does have an  elevated glucose to 585.  Normal bicarb, normal gap, not consistent with DKA.  He has a sodium level of 126 though the sodium corrected for his hyperglycemia is 138 [employer], 1999).  CT soft tissue neck reveals cellulitis overlying the left mandible extending into the submental region without fluid collection or deep space infection.  I consulted the hospitalist for admission given his uncontrolled diabetes requiring IV insulin  and IV fluids in the setting of this facial infection.  Dr. Giles Labrum has agreed to admit the patient.  Patient also agrees with the plan for admission.    Patient's presentation is most consistent with acute presentation with potential threat to life or bodily function.    FINAL CLINICAL IMPRESSION(S) / ED DIAGNOSES   Final diagnoses:  Facial cellulitis  Hyperglycemia     Rx / DC Orders   ED Discharge Orders     None        Note:  This document was prepared using Dragon voice recognition software and may include unintentional dictation errors.   Macky Galik E, PA-C 03/10/24 1443    Kandee Orion, MD 03/10/24 [phone]

## 2024-03-10 NOTE — H&P (Signed)
 History and Physical    [name] UJW:[id number] DOB: 1956-09-01 [name]: 03/10/2024  DOS: the patient was seen and examined on 03/10/2024  PCP: Revelo, Randall Babe, MD   Patient coming from: Home  I have personally briefly reviewed patient's old medical records in [facility] Link  Chief Complaint: Swelling on the chin  HPI: [name] is a pleasant 68 y.o. male with medical history significant for diabetes on insulin , history of multiple cancers including lymphoma and colon cancer on remission who came into the hospital complaining of facial swelling to the chin after shaving.  Patient noted swelling initially and started feeling red.  Patient went to [facility] and sent him to the ED at [facility].  Patient denies any purulent discharge.  Denies any fever chills nausea vomiting abdominal distention or palpitations.  He denies any signs of uncontrolled diabetes.  ED Course: Upon arrival to the ED, patient is found to have cellulitis of the chin without any abscess on CT scan.  Patient received IV antibiotic and hospitalist service was consulted for evaluation for admission.  Review of Systems:  ROS  All other systems negative except as noted in the HPI.  Past Medical History:  Diagnosis Date   Cancer (HCC)    Diabetes mellitus without complication [facility])     Past Surgical History:  Procedure Laterality Date   COLON SURGERY     ERCP N/A 01/17/2024   Procedure: ERCP, WITH INTERVENTION IF INDICATED;  Surgeon: Randall Sink, MD;  Location: [facility] ENDOSCOPY;  Service: Endoscopy;  Laterality: N/A;   OTHER SURGICAL HISTORY     J pouch 1997, history of ileostomy with reversal   POUCHOSCOPY N/A 04/10/2022   Procedure: POUCHOSCOPY;  Surgeon: Randall Buckle, DO;  Location: [facility] ENDOSCOPY;  Service: Gastroenterology;  Laterality: N/A;     reports that he has never smoked. He has never used smokeless tobacco. He reports that he does not drink alcohol and does not use drugs.  Allergies   Allergen Reactions   Sulfa Antibiotics Rash and Hives   Oxycodone  Other (See Comments) and Rash    Rash and headache    Family History  Problem Relation Age of Onset   Colon cancer Sister    Crohn's disease Brother     Prior to Admission medications   Medication Sig Start Date End Date Taking? Authorizing Provider  albuterol  (VENTOLIN  HFA) 108 (90 Base) MCG/ACT inhaler Inhale 2 puffs into the lungs every 2 (two) hours as needed for wheezing or shortness of breath. 07/27/22   [name], Natalie, DO  cyclobenzaprine  (FLEXERIL ) 10 MG tablet Take 1 tablet (10 mg total) by mouth 3 (three) times daily as needed for muscle spasms. 07/27/22   [name], Natalie, DO  JARDIANCE  10 MG TABS tablet Take 10 mg by mouth daily. 03/28/22   [provider]  LANTUS  SOLOSTAR 100 UNIT/ML Solostar Pen Inject 28 Units into the skin daily. At 12 noon 01/18/24   Randall Glaze, MD  LORazepam  (ATIVAN ) 0.5 MG tablet Take 0.5 mg by mouth daily as needed.    [provider]  mupirocin  ointment (BACTROBAN ) 2 % Apply 1 Application topically daily.    [provider]  OLANZapine  (ZYPREXA ) 5 MG tablet Take 1.5 tablets (7.5 mg total) by mouth at bedtime. 07/27/22 08/26/22  [name], Natalie, DO  omeprazole (PRILOSEC) 40 MG capsule Take 40 mg by mouth daily.     [provider]  venlafaxine  XR (EFFEXOR -XR) 150 MG 24 hr capsule Take 150 mg by mouth daily. 03/20/17  [provider]    Physical Exam: Vitals:   03/10/24 1121 03/10/24 1122  BP:  (!) 161/95  Pulse:  (!) 108  Resp:  18  Temp:  98.2 F (36.8 C)  SpO2:  100%  Weight: 74.8 kg   Height: 5' 10 (1.778 m)     Physical Exam   Constitutional: Alert, awake, calm, comfortable HEENT: Neck supple Respiratory: Clear to auscultation B/L, no wheezing, no rales.  Cardiovascular: Regular rate and rhythm, no murmurs / rubs / gallops. No extremity edema. 2+ pedal pulses. No carotid bruits.  Abdomen: Soft, no tenderness,  Bowel sounds positive.  Musculoskeletal: no clubbing / cyanosis. Good ROM, no contractures. Normal muscle tone.  Skin: He has a swelling and redness erythema on his chin Neurologic: CN 2-12 grossly intact. Sensation intact, No focal deficit identified Psychiatric: Alert and oriented x 3. Normal mood.    Labs on Admission: I have personally reviewed following labs and imaging studies  CBC: Recent Labs  Lab 03/10/24 1125 03/10/24 1418  WBC 7.8 8.4  HGB 13.5 12.3*  HCT 37.6* 35.2*  MCV 81.6 81.3  PLT 154 142*   Basic Metabolic Panel: Recent Labs  Lab 03/10/24 1125 03/10/24 1418  NA 126*  --   K 4.3  --   CL 94*  --   CO2 26  --   GLUCOSE 585*  --   BUN 25*  --   CREATININE 1.74* 1.47*  CALCIUM  8.9  --    GFR: Estimated Creatinine Clearance: 49 mL/min (A) (by C-G formula based on SCr of 1.47 mg/dL (H)).  Urine analysis:    Component Value Date/Time   COLORURINE YELLOW (A) 01/17/2024 0027   APPEARANCEUR HAZY (A) 01/17/2024 0027   APPEARANCEUR Clear 10/22/2014 1107   LABSPEC 1.017 01/17/2024 0027   LABSPEC 1.015 10/22/2014 1107   PHURINE 5.0 01/17/2024 0027   GLUCOSEU NEGATIVE 01/17/2024 0027   GLUCOSEU Negative 10/22/2014 1107   HGBUR NEGATIVE 01/17/2024 0027   BILIRUBINUR NEGATIVE 01/17/2024 0027   BILIRUBINUR Negative 10/22/2014 1107   KETONESUR NEGATIVE 01/17/2024 0027   PROTEINUR NEGATIVE 01/17/2024 0027   NITRITE NEGATIVE 01/17/2024 0027   LEUKOCYTESUR NEGATIVE 01/17/2024 0027   LEUKOCYTESUR Negative 10/22/2014 1107    Radiological Exams on Admission: I have personally reviewed images CT Soft Tissue Neck W Contrast Result Date: 03/10/2024 CLINICAL DATA:  Soft tissue infection suspected. Facial swelling to the chin. EXAM: CT NECK WITH CONTRAST TECHNIQUE: Multidetector CT imaging of the neck was performed using the standard protocol following the bolus administration of intravenous contrast. RADIATION DOSE REDUCTION: This exam was performed according to the  departmental dose-optimization program which includes automated exposure control, adjustment of the mA and/or kV according to patient size and/or use of iterative reconstruction technique. CONTRAST:  60mL OMNIPAQUE  IOHEXOL  300 MG/ML  SOLN COMPARISON:  None Available. FINDINGS: There is skin thickening at over the left aspect of the mandible extending into the submental region. Underlying stranding of the subcutaneous fat overlying the left mandible also extending into the submental region. No focal fluid collection noted. There is no soft tissue swelling within the deeper spaces of the neck. Pharynx and larynx: Nasopharynx is symmetric. Symmetric appearance of the tonsils. Slightly limited evaluation of the oral cavity due to streak artifact from dental hardware. Visualized portions of the oral cavity and floor of mouth are unremarkable. The base of tongue is unremarkable. Normal appearance of the epiglottis. No retropharyngeal effusion. Aryepiglottic folds and piriform sinuses are symmetric. Symmetric vocal folds. Salivary  glands: No inflammation, mass, or stone. Thyroid : Normal. Lymph nodes: No pathologically enlarged lymph nodes by imaging size criteria. No abnormal enhancement or abnormal density. Vascular: Mild calcified atherosclerosis along the visualized aortic arch and at the carotid bifurcations without evidence of high-grade stenosis. Partially visualized port with catheter entering the right internal jugular vein and SVC. Limited intracranial: Negative. Visualized orbits: Visualized orbits are unremarkable. Mastoids and visualized paranasal sinuses: No significant mucosal thickening in the visualized paranasal sinuses. Mastoid air cells are clear. Skeleton: Degenerative changes in the visualized spine with facet arthrosis at multiple levels most pronounced on the right at C3-4. Anterior cervical fusion from C5-C7. Upper chest: Focal scarring in the posterior right lung apex. Lung apices otherwise  unremarkable. Other: None. IMPRESSION: Skin thickening and inflammatory stranding in the soft tissues overlying the left mandible extending into the submental region concerning for cellulitis. No fluid collection to suggest abscess formation. No involvement of deeper spaces of the neck. No acute abnormality along the aerodigestive structures in the neck. No cervical lymphadenopathy. Electronically Signed   By: Denny Flack M.D.   On: 03/10/2024 13:34    EKG: My personal interpretation of EKG shows:     Assessment/Plan Principal Problem:   Cellulitis Active Problems:   Hyponatremia   Type II diabetes mellitus with renal manifestations (HCC)   Small cell b-cell lymphoma, in remission     This is a 68 year old male who has a history of diabetes, history of chronic hyponatremia, small cell lymphoma on remission who presented to ED for swelling of the chin.  1.  Cellulitis of the chin - He will be placed in observation - He received antibiotic in the ED. - Will start him on cefazolin IV and reevaluate in the morning if he feels better then may transition to oral at could be discharged - Follow-up cultures - CT scan did not show any abscess to drain.  Will continue to monitor if gets worse or need to drain we may need to call surgical consult. - Patient will be given Tylenol  and Percocet for pain  2.  Diabetes - Patient has pump, will allow him to utilize his pump. - He will be on sliding scale - He will be on glycemic protocol. - Will hold his oral hypoglycemic medication while he is in the hospital including metformin  and Jardiance .  3.  Chronic hyponatremia - Continue to monitor - Will check in the morning  4.  Hypotension/hypotension - It appears that he used to take midodrine at home - His blood pressure is high today - Will continue to hold off on midodrine.  5.  He used to take olanzapine  in the past at bedtime. - That has been continued for now while in the hospital -  We may discontinue at the time of discharge    DVT prophylaxis: Lovenox  Code Status: Full Code Family Communication: N/A Disposition Plan: Home Consults called: N/A Admission status: Observation, Med-Surg   Beatris Bough, MD [facility] 03/10/2024, 3:05 PM

## 2024-03-10 NOTE — ED Triage Notes (Signed)
 Pt comes with facial swelling to chin the patient states week ago he was shaving and cut himself. Pt has now swelling and redness to area. Pt went to [facility] and the sent him here.

## 2024-03-10 NOTE — Inpatient Diabetes Management (Signed)
 Inpatient Diabetes Program Recommendations  AACE/ADA: New Consensus Statement on Inpatient Glycemic Control  Target Ranges:  Prepandial:   less than 140 mg/dL      Peak postprandial:   less than 180 mg/dL (1-2 hours)      Critically ill patients:  140 - 180 mg/dL    Latest Reference Range & Units 03/10/24 14:13  Glucose-Capillary 70 - 99 mg/dL 308 (H)    Latest Reference Range & Units 03/10/24 11:25  CO2 22 - 32 mmol/L 26  Glucose 70 - 99 mg/dL 657 (HH)  Anion gap 5 - 15  6   Review of Glycemic Control  Diabetes history: DM2 Outpatient Diabetes medications: Lantus  28 units daily at noon, Jardiance  10 mg daily Current orders for Inpatient glycemic control: Semglee  28 units QHS  Inpatient Diabetes Program Recommendations:    Insulin : Please consider ordering CBGs AC&HS, Novolog  0-9 units TID with meals and Novolog  0-5 units at bedtime.  NOTE: Patient in ED with facial swelling. Lab glucose 585 mg/dl at 84:69 am today. Patient was given Novolog  10 units at 12:25 today and finger stick CBG 159 mg/dl at 62:95 today. Per chart review, patient sees Dr. Dama Duffel [facility]) and was seen on 02/11/24. Per office note on 02/11/24 patient was asked to increase Lantus  to 33 units daily and continue Jardiance  10 mg daily.  Thanks, Beacher Limerick, RN, MSN, CDCES Diabetes Coordinator Inpatient Diabetes Program [phone] (Team Pager from 8am to 5pm)

## 2024-03-11 DIAGNOSIS — Z794 Long term (current) use of insulin: Secondary | ICD-10-CM | POA: Diagnosis not present

## 2024-03-11 DIAGNOSIS — L0201 Cutaneous abscess of face: Secondary | ICD-10-CM

## 2024-03-11 DIAGNOSIS — E1165 Type 2 diabetes mellitus with hyperglycemia: Secondary | ICD-10-CM

## 2024-03-11 DIAGNOSIS — L03211 Cellulitis of face: Secondary | ICD-10-CM | POA: Diagnosis not present

## 2024-03-11 LAB — CBC
HCT: 35.1 % — ABNORMAL LOW (ref 39.0–52.0)
Hemoglobin: 12.3 g/dL — ABNORMAL LOW (ref 13.0–17.0)
MCH: 28.4 pg (ref 26.0–34.0)
MCHC: 35 g/dL (ref 30.0–36.0)
MCV: 81.1 fL (ref 80.0–100.0)
Platelets: 142 10*3/uL — ABNORMAL LOW (ref 150–400)
RBC: 4.33 MIL/uL (ref 4.22–5.81)
RDW: 11.9 % (ref 11.5–15.5)
WBC: 6.4 10*3/uL (ref 4.0–10.5)
nRBC: 0 % (ref 0.0–0.2)

## 2024-03-11 LAB — BASIC METABOLIC PANEL WITH GFR
Anion gap: 10 (ref 5–15)
BUN: 19 mg/dL (ref 8–23)
CO2: 22 mmol/L (ref 22–32)
Calcium: 8.5 mg/dL — ABNORMAL LOW (ref 8.9–10.3)
Chloride: 104 mmol/L (ref 98–111)
Creatinine, Ser: 1.5 mg/dL — ABNORMAL HIGH (ref 0.61–1.24)
GFR, Estimated: 50 mL/min — ABNORMAL LOW (ref >60)
Glucose, Bld: 106 mg/dL — ABNORMAL HIGH (ref 70–99)
Potassium: 3.2 mmol/L — ABNORMAL LOW (ref 3.5–5.1)
Sodium: 136 mmol/L (ref 135–145)

## 2024-03-11 LAB — GLUCOSE, CAPILLARY
Glucose-Capillary: 186 mg/dL — ABNORMAL HIGH (ref 70–99)
Glucose-Capillary: 321 mg/dL — ABNORMAL HIGH (ref 70–99)

## 2024-03-11 MED ORDER — CEPHALEXIN 500 MG PO CAPS
500.0000 mg | ORAL_CAPSULE | Freq: Three times a day (TID) | ORAL | Status: DC
  Administered 2024-03-11: 500 mg via ORAL
  Filled 2024-03-11: qty 1

## 2024-03-11 MED ORDER — DOXYCYCLINE HYCLATE 100 MG PO TABS: 100.0000 mg | ORAL_TABLET | Freq: Two times a day (BID) | ORAL | 0 refills | Status: AC

## 2024-03-11 MED ORDER — OXYCODONE HCL 5 MG PO TABS: 5.0000 mg | ORAL_TABLET | Freq: Three times a day (TID) | ORAL | 0 refills | Status: DC | PRN

## 2024-03-11 MED ORDER — DOXYCYCLINE HYCLATE 100 MG PO TABS
100.0000 mg | ORAL_TABLET | Freq: Two times a day (BID) | ORAL | Status: DC
  Administered 2024-03-11: 100 mg via ORAL
  Filled 2024-03-11: qty 1

## 2024-03-11 MED ORDER — CEPHALEXIN 500 MG PO CAPS: 500.0000 mg | ORAL_CAPSULE | Freq: Three times a day (TID) | ORAL | 0 refills | Status: AC

## 2024-03-11 MED ORDER — POTASSIUM CHLORIDE CRYS ER 20 MEQ PO TBCR
40.0000 meq | EXTENDED_RELEASE_TABLET | Freq: Once | ORAL | Status: AC
  Administered 2024-03-11: 40 meq via ORAL
  Filled 2024-03-11: qty 2

## 2024-03-11 NOTE — Care Management Obs Status (Signed)
 MEDICARE OBSERVATION STATUS NOTIFICATION   Patient Details  Name: [name] MRN: [id number] Date of Birth: [birth year]   Medicare Observation Status Notification Given:  Yes    Anise Kerns 03/11/2024, 11:20 AM

## 2024-03-11 NOTE — Consult Note (Signed)
 [facility] SURGICAL CONSULTATION NOTE (initial) - cpt: 95621   HISTORY OF PRESENT ILLNESS (HPI):  69 y.o. male presented to [facility] ED yesterday secondary to facial swelling. Patient reports he was shaving about 5 days ago when he cut his chin. He does report that the razor must have been older in nature. He did utilize alcohol to clean the area at first. Unfortunately, he reports about 3 days ago, he started to develop swelling and pain to the left chin which radiated up his jaw/cheek. No fever, chills, trouble swallowing, hearing changes. Work up in the ED revealed a normal WBC to 8.4K, Hgb to 12.3, sCr baseline - 1.74, glucose was 585 on admission. He did also undergo CT Neck concerning for soft tissue inflammation in his left mandible, no gross abscess. He was admitted to the medicine service. Currently on Keflex and Doxycycline.   Surgery is consulted by hospitalist physician Dr. Melvinia Stager, MD in this context for evaluation and management of left mandible cellulitis.  PAST MEDICAL HISTORY (PMH):  Past Medical History:  Diagnosis Date   Cancer (HCC)    Diabetes mellitus without complication (HCC)      PAST SURGICAL HISTORY (PSH):  Past Surgical History:  Procedure Laterality Date   COLON SURGERY     ERCP N/A 01/17/2024   Procedure: ERCP, WITH INTERVENTION IF INDICATED;  Surgeon: Marnee Sink, MD;  Location: [facility] ENDOSCOPY;  Service: Endoscopy;  Laterality: N/A;   OTHER SURGICAL HISTORY     J pouch 1997, history of ileostomy with reversal   POUCHOSCOPY N/A 04/10/2022   Procedure: POUCHOSCOPY;  Surgeon: Quintin Buckle, DO;  Location: [facility] ENDOSCOPY;  Service: Gastroenterology;  Laterality: N/A;     MEDICATIONS:  Prior to Admission medications   Medication Sig Start Date End Date Taking? Authorizing Provider  albuterol  (VENTOLIN  HFA) 108 (90 Base) MCG/ACT inhaler Inhale 2 puffs into the lungs every 2 (two) hours as needed for wheezing or shortness of breath.  07/27/22  Yes Alexander, Natalie, DO  atorvastatin  (LIPITOR) 40 MG tablet Take 40 mg by mouth daily. 11/26/18  Yes [provider]  cyclobenzaprine  (FLEXERIL ) 10 MG tablet Take 1 tablet (10 mg total) by mouth 3 (three) times daily as needed for muscle spasms. 07/27/22  Yes Alexander, Natalie, DO  Glucagon 3 MG/DOSE POWD Place 1 spray into both nostrils as directed. 06/13/23  Yes [provider]  hydrOXYzine  (ATARAX ) 25 MG tablet Take 25 mg by mouth 2 (two) times daily.   Yes [provider]  JARDIANCE  10 MG TABS tablet Take 10 mg by mouth daily. 03/28/22  Yes [provider]  LANTUS  SOLOSTAR 100 UNIT/ML Solostar Pen Inject 28 Units into the skin daily. At 12 noon 01/18/24  Yes Wieting, Richard, MD  LORazepam  (ATIVAN ) 0.5 MG tablet Take 0.5 mg by mouth daily as needed.   Yes [provider]  midodrine (PROAMATINE) 5 MG tablet Take 5 mg by mouth 2 (two) times daily.   Yes [provider]  omeprazole (PRILOSEC) 40 MG capsule Take 40 mg by mouth daily.    Yes [provider]  ondansetron  (ZOFRAN ) 8 MG tablet Take 8 mg by mouth every 8 (eight) hours as needed for nausea. 02/28/23  Yes [provider]  venlafaxine  XR (EFFEXOR -XR) 150 MG 24 hr capsule Take 150 mg by mouth daily. 03/20/17  Yes [provider]  OLANZapine  (ZYPREXA ) 5 MG tablet Take 1.5 tablets (7.5 mg total) by mouth at bedtime. 07/27/22 08/26/22  Alexander, Natalie, DO  ALLERGIES:  Allergies  Allergen Reactions   Sulfa Antibiotics Rash and Hives   Oxycodone  Other (See Comments) and Rash    Rash and headache     SOCIAL HISTORY:  Social History   Socioeconomic History   Marital status: Married    Spouse name: Not on file   Number of children: Not on file   Years of education: Not on file   Highest education level: Not on file  Occupational History   Not on file  Tobacco Use   Smoking status: Never   Smokeless tobacco: Never  Vaping Use   Vaping status:  Never Used  Substance and Sexual Activity   Alcohol use: Never   Drug use: Never   Sexual activity: Not on file  Other Topics Concern   Not on file  Social History Narrative   Not on file   Social Drivers of Health   Financial Resource Strain: Low Risk  (07/29/2020)   Received from [facility]   Overall Financial Resource Strain (CARDIA)    Difficulty of Paying Living Expenses: Not hard at all  Food Insecurity: No Food Insecurity (03/10/2024)   Hunger Vital Sign    Worried About Running Out of Food in the Last Year: Never true    Ran Out of Food in the Last Year: Never true  Transportation Needs: No Transportation Needs (03/10/2024)   PRAPARE - Administrator, Civil Service (Medical): No    Lack of Transportation (Non-Medical): No  Physical Activity: Sufficiently Active (06/16/2021)   Received from [facility]   Exercise Vital Sign    On average, how many days per week do you engage in moderate to strenuous exercise (like a brisk walk)?: 4 days    On average, how many minutes do you engage in exercise at this level?: 50 min  Stress: No Stress Concern Present (06/16/2021)   Received from [facility] - Occupational Stress Questionnaire    Feeling of Stress : Only a little  Social Connections: Moderately Integrated (03/10/2024)   Social Connection and Isolation Panel    Frequency of Communication with Friends and Family: Three times a week    Frequency of Social Gatherings with Friends and Family: Three times a week    Attends Religious Services: More than 4 times per year    Active Member of Clubs or Organizations: No    Attends Banker Meetings: Never    Marital Status: Married  Catering manager Violence: Not At Risk (03/10/2024)   Humiliation, Afraid, Rape, and Kick questionnaire    Fear of Current or Ex-Partner: No    Emotionally Abused: No    Physically Abused: No    Sexually Abused: No      FAMILY HISTORY:  Family History  Problem Relation Age of Onset   Colon cancer Sister    Crohn's disease Brother       REVIEW OF SYSTEMS:  Review of Systems  Constitutional:  Negative for chills and fever.  HENT:  Negative for ear discharge, ear pain, hearing loss, sinus pain and sore throat.   Respiratory:  Negative for stridor.   Skin:  Negative for itching and rash.       + Cellulitis   All other systems reviewed and are negative.   VITAL SIGNS:  Temp:  [98 F (36.7 C)-98.4 F (36.9 C)] 98 F (36.7 C) (06/18 0845) Pulse Rate:  [88-108] 92 (06/18 0845) Resp:  [  18-20] 20 (06/18 0845) BP: (106-161)/(73-95) 106/73 (06/18 0845) SpO2:  [100 %] 100 % (06/18 0845) Weight:  [74.8 kg] 74.8 kg (06/17 1121)     Height: 5' 10 (177.8 cm) Weight: 74.8 kg BMI (Calculated): 23.68   INTAKE/OUTPUT:  06/17 0701 - 06/18 0700 In: 100 [IV Piggyback:100] Out: -   PHYSICAL EXAM:  Physical Exam Vitals and nursing note reviewed. Exam conducted with a chaperone present.  Constitutional:      General: He is not in acute distress.    Appearance: Normal appearance. He is normal weight. He is not ill-appearing.  HENT:     Head: Normocephalic and atraumatic.     Comments: To the left mandible there is an area of erythema and induration, worse underneath mandible. There is a punctate opening superiorly on the chin which is draining purulence. I was able to express a significant amount from this.   Eyes:     General: No scleral icterus.    Conjunctiva/sclera: Conjunctivae normal.   Pulmonary:     Effort: Pulmonary effort is normal. No respiratory distress.  Genitourinary:    Comments: Deferred  Skin:    General: Skin is warm and dry.   Neurological:     General: No focal deficit present.     Mental Status: He is alert and oriented to person, place, and time.   Psychiatric:        Mood and Affect: Mood normal.        Behavior: Behavior normal.      Labs:     Latest Ref Rng &  Units 03/11/2024    5:39 AM 03/10/2024    2:18 PM 03/10/2024   11:25 AM  CBC  WBC 4.0 - 10.5 K/uL 6.4  8.4  7.8   Hemoglobin 13.0 - 17.0 g/dL 16.1  09.6  04.5   Hematocrit 39.0 - 52.0 % 35.1  35.2  37.6   Platelets 150 - 400 K/uL 142  142  154       Latest Ref Rng & Units 03/11/2024    5:39 AM 03/10/2024    2:18 PM 03/10/2024   11:25 AM  CMP  Glucose 70 - 99 mg/dL 409   811   BUN 8 - 23 mg/dL 19   25   Creatinine 9.14 - 1.24 mg/dL 7.82  9.56  2.13   Sodium 135 - 145 mmol/L 136   126   Potassium 3.5 - 5.1 mmol/L 3.2   4.3   Chloride 98 - 111 mmol/L 104   94   CO2 22 - 32 mmol/L 22   26   Calcium  8.9 - 10.3 mg/dL 8.5   8.9      Imaging studies:   CT Soft Tissue Neck (03/10/2024) personally reviewed and agree with radiologist report below: IMPRESSION: Skin thickening and inflammatory stranding in the soft tissues overlying the left mandible extending into the submental region concerning for cellulitis. No fluid collection to suggest abscess formation. No involvement of deeper spaces of the neck.   No acute abnormality along the aerodigestive structures in the neck.   No cervical lymphadenopathy.   Assessment/Plan:  68 y.o. male with left mandibular cellulitis and spontaneously draining small abscess, complicated by poorly controlled DM on admission.   - Area of cellulitis and small abscess is spontaneously draining. I do not think there is need for further additional I&D at this time. I agree with management with PO Abx. I will be happy to be a resource as an outpatient  to ensure this improves. Better glycemic control. Further management per primary service.   All of the above findings and recommendations were discussed with the patient and his family, and all of their questions were answered to their expressed satisfaction.  Thank you for the opportunity to participate in this patient's care.   -- Apolonio Bay, PA-C [facility] 03/11/2024, 11:14 AM M-F:  7am - 4pm

## 2024-03-11 NOTE — Discharge Summary (Signed)
 Physician Discharge Summary   Patient: [name] MRN: [id number] DOB: 05/06/1956  Admit date:     03/10/2024  Discharge date: 03/11/24  Discharge Physician: Melvinia Stager   PCP: Kenton Pearl, MD   Recommendations at discharge:    F/u Gen surgery Ardelia Beau in 1 week  Discharge Diagnoses: Principal Problem:   Cellulitis Active Problems:   Hyponatremia   Type II diabetes mellitus with renal manifestations (HCC)   Small cell b-cell lymphoma, in remission   Facial cellulitis   [name] is a pleasant 68 y.o. male with medical history significant for diabetes on insulin , history of multiple cancers including lymphoma and colon cancer on remission who came into the hospital complaining of facial swelling to the chin after shaving.  Patient noted swelling initially and started feeling red.  Patient went to [facility] and sent him to the ED at [facility].    Cellulitis/small abscess of the chin -- patient had laceration of his skin last week while shaving. Started getting red and tender along with some hardening came to the ER. Started on IV cefazolin -- afebrile -- CT scan did not show any abscess to drain -- consulted general surgery recommend outpatient follow-up. At bedside some amount of pus was squeezed by general surgery. No indication for IND per surgery -- Will discharge patient on oral Keflex and doxycycline along with pain meds. -- No fever and white count normal.  Type II diabetes with hyperglycemia -- continue home meds including oral and insulin   Hyponatremia improved -- suspected due to high sugars  Discharge plan discussed with patient he is agreeable.          Consultants: general surgery Disposition: Home Diet recommendation:  Discharge Diet Orders (From admission, onward)     Start     Ordered   03/11/24 0000  Diet - low sodium heart healthy        03/11/24 1204   03/11/24 0000  Diet Carb Modified        03/11/24 1204           Cardiac and  Carb modified diet DISCHARGE MEDICATION: Allergies as of 03/11/2024       Reactions   Sulfa Antibiotics Rash, Hives   Oxycodone  Other (See Comments), Rash   Rash and headache        Medication List     STOP taking these medications    OLANZapine  5 MG tablet Commonly known as: ZyPREXA        TAKE these medications    albuterol  108 (90 Base) MCG/ACT inhaler Commonly known as: VENTOLIN  HFA Inhale 2 puffs into the lungs every 2 (two) hours as needed for wheezing or shortness of breath.   atorvastatin  40 MG tablet Commonly known as: LIPITOR Take 40 mg by mouth daily.   cephALEXin 500 MG capsule Commonly known as: KEFLEX Take 1 capsule (500 mg total) by mouth every 8 (eight) hours for 21 doses.   cyclobenzaprine  10 MG tablet Commonly known as: FLEXERIL  Take 1 tablet (10 mg total) by mouth 3 (three) times daily as needed for muscle spasms.   doxycycline 100 MG tablet Commonly known as: VIBRA-TABS Take 1 tablet (100 mg total) by mouth every 12 (twelve) hours for 7 days.   Glucagon 3 MG/DOSE Powd Place 1 spray into both nostrils as directed.   hydrOXYzine  25 MG tablet Commonly known as: ATARAX  Take 25 mg by mouth 2 (two) times daily.   Jardiance  10 MG Tabs tablet Generic drug: empagliflozin  Take 10  mg by mouth daily.   Lantus  SoloStar 100 UNIT/ML Solostar Pen Generic drug: insulin  glargine Inject 28 Units into the skin daily. At 12 noon   LORazepam  0.5 MG tablet Commonly known as: ATIVAN  Take 0.5 mg by mouth daily as needed.   midodrine 5 MG tablet Commonly known as: PROAMATINE Take 5 mg by mouth 2 (two) times daily.   omeprazole 40 MG capsule Commonly known as: PRILOSEC Take 40 mg by mouth daily.   ondansetron  8 MG tablet Commonly known as: ZOFRAN  Take 8 mg by mouth every 8 (eight) hours as needed for nausea.   oxyCODONE  5 MG immediate release tablet Commonly known as: Oxy IR/ROXICODONE  Take 1 tablet (5 mg total) by mouth every 8 (eight) hours as  needed for severe pain (pain score 7-10) or moderate pain (pain score 4-6).   venlafaxine  XR 150 MG 24 hr capsule Commonly known as: EFFEXOR -XR Take 150 mg by mouth daily.        Follow-up Information     Schulz, Zachary R, PA-C. Schedule an appointment as soon as possible for a visit in 1 week(s).   Specialty: Physician Assistant Why: Chin cellulitis/abscess Contact information: [address] [place] [address] [phone]                Discharge Exam: [name] Weights   03/10/24 1121  Weight: 74.8 kg     Condition at discharge: fair  The results of significant diagnostics from this hospitalization (including imaging, microbiology, ancillary and laboratory) are listed below for reference.   Imaging Studies: CT Soft Tissue Neck W Contrast Result Date: 03/10/2024 CLINICAL DATA:  Soft tissue infection suspected. Facial swelling to the chin. EXAM: CT NECK WITH CONTRAST TECHNIQUE: Multidetector CT imaging of the neck was performed using the standard protocol following the bolus administration of intravenous contrast. RADIATION DOSE REDUCTION: This exam was performed according to the departmental dose-optimization program which includes automated exposure control, adjustment of the mA and/or kV according to patient size and/or use of iterative reconstruction technique. CONTRAST:  60mL OMNIPAQUE  IOHEXOL  300 MG/ML  SOLN COMPARISON:  None Available. FINDINGS: There is skin thickening at over the left aspect of the mandible extending into the submental region. Underlying stranding of the subcutaneous fat overlying the left mandible also extending into the submental region. No focal fluid collection noted. There is no soft tissue swelling within the deeper spaces of the neck. Pharynx and larynx: Nasopharynx is symmetric. Symmetric appearance of the tonsils. Slightly limited evaluation of the oral cavity due to streak artifact from dental hardware. Visualized portions of the  oral cavity and floor of mouth are unremarkable. The base of tongue is unremarkable. Normal appearance of the epiglottis. No retropharyngeal effusion. Aryepiglottic folds and piriform sinuses are symmetric. Symmetric vocal folds. Salivary glands: No inflammation, mass, or stone. Thyroid : Normal. Lymph nodes: No pathologically enlarged lymph nodes by imaging size criteria. No abnormal enhancement or abnormal density. Vascular: Mild calcified atherosclerosis along the visualized aortic arch and at the carotid bifurcations without evidence of high-grade stenosis. Partially visualized port with catheter entering the right internal jugular vein and SVC. Limited intracranial: Negative. Visualized orbits: Visualized orbits are unremarkable. Mastoids and visualized paranasal sinuses: No significant mucosal thickening in the visualized paranasal sinuses. Mastoid air cells are clear. Skeleton: Degenerative changes in the visualized spine with facet arthrosis at multiple levels most pronounced on the right at C3-4. Anterior cervical fusion from C5-C7. Upper chest: Focal scarring in the posterior right lung apex. Lung apices otherwise unremarkable.  Other: None. IMPRESSION: Skin thickening and inflammatory stranding in the soft tissues overlying the left mandible extending into the submental region concerning for cellulitis. No fluid collection to suggest abscess formation. No involvement of deeper spaces of the neck. No acute abnormality along the aerodigestive structures in the neck. No cervical lymphadenopathy. Electronically Signed   By: Denny Flack M.D.   On: 03/10/2024 13:34    Microbiology: Results for orders placed or performed during the hospital encounter of 01/17/24  Blood Culture (routine x 2)     Status: None   Collection Time: 01/17/24  4:05 AM   Specimen: BLOOD LEFT ARM  Result Value Ref Range Status   Specimen Description BLOOD LEFT ARM  Final   Special Requests   Final    BOTTLES DRAWN AEROBIC AND  ANAEROBIC Blood Culture results may not be optimal due to an inadequate volume of blood received in culture bottles   Culture   Final    NO GROWTH 5 DAYS Performed at [facility], [address] [place], [place] [address]    Report Status 01/22/2024 FINAL  Final  Blood Culture (routine x 2)     Status: None   Collection Time: 01/17/24  4:05 AM   Specimen: BLOOD RIGHT ARM  Result Value Ref Range Status   Specimen Description BLOOD RIGHT ARM  Final   Special Requests   Final    BOTTLES DRAWN AEROBIC AND ANAEROBIC Blood Culture results may not be optimal due to an inadequate volume of blood received in culture bottles   Culture   Final    NO GROWTH 5 DAYS Performed at [facility], [address] [place], [place] [address]    Report Status 01/22/2024 FINAL  Final    Labs: CBC: Recent Labs  Lab 03/10/24 1125 03/10/24 1418 03/11/24 0539  WBC 7.8 8.4 6.4  HGB 13.5 12.3* 12.3*  HCT 37.6* 35.2* 35.1*  MCV 81.6 81.3 81.1  PLT 154 142* 142*   Basic Metabolic Panel: Recent Labs  Lab 03/10/24 1125 03/10/24 1418 03/11/24 0539  NA 126*  --  136  K 4.3  --  3.2*  CL 94*  --  104  CO2 26  --  22  GLUCOSE 585*  --  106*  BUN 25*  --  19  CREATININE 1.74* 1.47* 1.50*  CALCIUM  8.9  --  8.5*   Liver Function Tests: No results for input(s): AST, ALT, ALKPHOS, BILITOT, PROT, ALBUMIN in the last 168 hours. CBG: Recent Labs  Lab 03/10/24 1413 03/10/24 1814 03/10/24 2043 03/11/24 0844 03/11/24 1152  GLUCAP 159* 364* 209* 186* 321*    Discharge time spent: greater than 30 minutes.  Signed: Melvinia Stager, MD [facility] 03/11/2024

## 2024-03-11 NOTE — Progress Notes (Signed)
 Wallet and keys given to pt's wife.

## 2024-03-11 NOTE — Plan of Care (Signed)

## 2024-03-11 NOTE — TOC CM/SW Note (Signed)
 Transition of Care [facility]) - Inpatient Brief Assessment   Patient Details  Name: Eris Breck MRN: [id number] Date of Birth: [birth year]  [facility]) CM/SW Contact:    Odilia Bennett, LCSW Phone Number: 03/11/2024, 12:21 PM   Clinical Narrative: Patient has orders to discharge home today. Chart reviewed. No TOC needs identified. CSW signing off.  Transition of Care Asessment: Insurance and Status: Insurance coverage has been reviewed Patient has primary care physician: Yes Home environment has been reviewed: Single family home Prior level of function:: Not documented Prior/Current Home Services: No current home services Social Drivers of Health Review: SDOH reviewed no interventions necessary Readmission risk has been reviewed: Yes Transition of care needs: no transition of care needs at this time

## 2024-03-13 ENCOUNTER — Ambulatory Visit: Admit: 2024-03-13 | Payer: PRIVATE HEALTH INSURANCE

## 2024-03-13 DIAGNOSIS — C23 Malignant neoplasm of gallbladder: Principal | ICD-10-CM

## 2024-03-16 DIAGNOSIS — C23 Malignant neoplasm of gallbladder: Principal | ICD-10-CM

## 2024-03-17 ENCOUNTER — Ambulatory Visit: Admit: 2024-03-17 | Payer: PRIVATE HEALTH INSURANCE

## 2024-03-17 DIAGNOSIS — C23 Malignant neoplasm of gallbladder: Principal | ICD-10-CM

## 2024-03-18 ENCOUNTER — Inpatient Hospital Stay: Admitting: Physician Assistant

## 2024-03-19 DIAGNOSIS — C23 Malignant neoplasm of gallbladder: Principal | ICD-10-CM

## 2024-03-23 DIAGNOSIS — C23 Malignant neoplasm of gallbladder: Principal | ICD-10-CM

## 2024-03-26 DIAGNOSIS — C23 Malignant neoplasm of gallbladder: Principal | ICD-10-CM

## 2024-04-01 DIAGNOSIS — C23 Malignant neoplasm of gallbladder: Principal | ICD-10-CM

## 2024-04-02 ENCOUNTER — Inpatient Hospital Stay: Admit: 2024-04-02 | Discharge: 2024-04-02 | Payer: Medicare (Managed Care)

## 2024-04-03 ENCOUNTER — Other Ambulatory Visit: Admit: 2024-04-03 | Discharge: 2024-04-03 | Payer: Medicare (Managed Care)

## 2024-04-03 ENCOUNTER — Ambulatory Visit: Admit: 2024-04-03 | Discharge: 2024-04-03 | Payer: Medicare (Managed Care)

## 2024-04-03 DIAGNOSIS — C23 Malignant neoplasm of gallbladder: Principal | ICD-10-CM

## 2024-04-03 DIAGNOSIS — E1165 Type 2 diabetes mellitus with hyperglycemia: Principal | ICD-10-CM

## 2024-04-04 DIAGNOSIS — C23 Malignant neoplasm of gallbladder: Principal | ICD-10-CM

## 2024-06-23 ENCOUNTER — Encounter: Payer: Self-pay | Admitting: Emergency Medicine

## 2024-06-23 ENCOUNTER — Emergency Department
Admission: EM | Admit: 2024-06-23 | Discharge: 2024-06-23 | Disposition: A | Discharge status: Home / Self Care | Attending: Emergency Medicine | Admitting: Emergency Medicine

## 2024-06-23 ENCOUNTER — Other Ambulatory Visit: Payer: Self-pay

## 2024-06-23 ENCOUNTER — Emergency Department

## 2024-06-23 DIAGNOSIS — Z85038 Personal history of other malignant neoplasm of large intestine: Secondary | ICD-10-CM | POA: Diagnosis not present

## 2024-06-23 DIAGNOSIS — L0390 Cellulitis, unspecified: Secondary | ICD-10-CM

## 2024-06-23 DIAGNOSIS — L03115 Cellulitis of right lower limb: Secondary | ICD-10-CM | POA: Insufficient documentation

## 2024-06-23 DIAGNOSIS — E119 Type 2 diabetes mellitus without complications: Secondary | ICD-10-CM | POA: Insufficient documentation

## 2024-06-23 DIAGNOSIS — M7989 Other specified soft tissue disorders: Secondary | ICD-10-CM | POA: Diagnosis present

## 2024-06-23 DIAGNOSIS — C23 Malignant neoplasm of gallbladder: Principal | ICD-10-CM

## 2024-06-23 LAB — CBC WITH DIFFERENTIAL/PLATELET
Abs Immature Granulocytes: 0.02 K/uL (ref 0.00–0.07)
Basophils Absolute: 0.1 K/uL (ref 0.0–0.1)
Basophils Relative: 1 %
Eosinophils Absolute: 0.2 K/uL (ref 0.0–0.5)
Eosinophils Relative: 5 %
HCT: 40.7 % (ref 39.0–52.0)
Hemoglobin: 13.8 g/dL (ref 13.0–17.0)
Immature Granulocytes: 0 %
Lymphocytes Relative: 19 %
Lymphs Abs: 0.9 K/uL (ref 0.7–4.0)
MCH: 27 pg (ref 26.0–34.0)
MCHC: 33.9 g/dL (ref 30.0–36.0)
MCV: 79.5 fL — ABNORMAL LOW (ref 80.0–100.0)
Monocytes Absolute: 0.5 K/uL (ref 0.1–1.0)
Monocytes Relative: 10 %
Neutro Abs: 2.9 K/uL (ref 1.7–7.7)
Neutrophils Relative %: 65 %
Platelets: 140 K/uL — ABNORMAL LOW (ref 150–400)
RBC: 5.12 MIL/uL (ref 4.22–5.81)
RDW: 13 % (ref 11.5–15.5)
WBC: 4.5 K/uL (ref 4.0–10.5)
nRBC: 0 % (ref 0.0–0.2)

## 2024-06-23 LAB — COMPREHENSIVE METABOLIC PANEL WITH GFR
ALT: 28 U/L (ref 0–44)
AST: 28 U/L (ref 15–41)
Albumin: 3.8 g/dL (ref 3.5–5.0)
Alkaline Phosphatase: 282 U/L — ABNORMAL HIGH (ref 38–126)
Anion gap: 7 (ref 5–15)
BUN: 26 mg/dL — ABNORMAL HIGH (ref 8–23)
CO2: 21 mmol/L — ABNORMAL LOW (ref 22–32)
Calcium: 8.9 mg/dL (ref 8.9–10.3)
Chloride: 99 mmol/L (ref 98–111)
Creatinine, Ser: 1.66 mg/dL — ABNORMAL HIGH (ref 0.61–1.24)
GFR, Estimated: 44 mL/min — ABNORMAL LOW (ref >60)
Glucose, Bld: 316 mg/dL — ABNORMAL HIGH (ref 70–99)
Potassium: 4.1 mmol/L (ref 3.5–5.1)
Sodium: 127 mmol/L — ABNORMAL LOW (ref 135–145)
Total Bilirubin: 1.4 mg/dL — ABNORMAL HIGH (ref 0.0–1.2)
Total Protein: 7.4 g/dL (ref 6.5–8.1)

## 2024-06-23 MED ORDER — SODIUM CHLORIDE 0.9 % IV SOLN
1.0000 g | Freq: Once | INTRAVENOUS | Status: AC
  Administered 2024-06-23: 1 g via INTRAVENOUS
  Filled 2024-06-23: qty 10

## 2024-06-23 MED ORDER — SODIUM CHLORIDE 0.9 % IV BOLUS: 500.0000 mL | Freq: Once | INTRAVENOUS | Status: DC

## 2024-06-23 MED ORDER — DOXYCYCLINE HYCLATE 100 MG PO TABS: 100.0000 mg | ORAL_TABLET | Freq: Two times a day (BID) | ORAL | 0 refills | Status: DC

## 2024-06-23 NOTE — ED Provider Notes (Signed)
 [facility] Provider Note    Event Date/Time   First MD Initiated Contact with Patient 06/23/24 1533     (approximate)   History   Leg Swelling   HPI  [name] is a 68 y.o. male history of diabetes, cancer x 4 presents emergency department with concerns of redness and swelling of the right lower extremity.  States cut his toenails the other day.  The redness's been on the right lower leg for 4 days.  States no drainage.  No fever.      Physical Exam   Triage Vital Signs: ED Triage Vitals  Encounter Vitals Group     BP 06/23/24 1400 127/89     Girls Systolic BP Percentile --      Girls Diastolic BP Percentile --      Boys Systolic BP Percentile --      Boys Diastolic BP Percentile --      Pulse Rate 06/23/24 1400 90     Resp 06/23/24 1400 18     Temp 06/23/24 1400 98 F (36.7 C)     Temp Source 06/23/24 1400 Oral     SpO2 06/23/24 1400 100 %     Weight 06/23/24 1402 165 lb (74.8 kg)     Height 06/23/24 1402 5' 10 (1.778 m)     Head Circumference --      Peak Flow --      Pain Score 06/23/24 1401 6     Pain Loc --      Pain Education --      Exclude from Growth Chart --     Most recent vital signs: Vitals:   06/23/24 1400 06/23/24 1811  BP: 127/89 124/80  Pulse: 90 85  Resp: 18 18  Temp: 98 F (36.7 C)   SpO2: 100% 100%     General: Awake, no distress.   CV:  Good peripheral perfusion. Resp:  Normal effort.  Abd:  No distention.   Other:  Right lower extremity with redness noted anteriorly, slightly tender to palpation, neurovascular intact   ED Results / Procedures / Treatments   Labs (all labs ordered are listed, but only abnormal results are displayed) Labs Reviewed  COMPREHENSIVE METABOLIC PANEL WITH GFR - Abnormal; Notable for the following components:      Result Value   Sodium 127 (*)    CO2 21 (*)    Glucose, Bld 316 (*)    BUN 26 (*)    Creatinine, Ser 1.66 (*)    Alkaline Phosphatase 282 (*)     Total Bilirubin 1.4 (*)    GFR, Estimated 44 (*)    All other components within normal limits  CBC WITH DIFFERENTIAL/PLATELET - Abnormal; Notable for the following components:   MCV 79.5 (*)    Platelets 140 (*)    All other components within normal limits     EKG     RADIOLOGY X-ray right lower extremity    PROCEDURES:   Procedures  Critical Care:  no Chief Complaint  Patient presents with   Leg Swelling      MEDICATIONS ORDERED IN ED: Medications  cefTRIAXone  (ROCEPHIN ) 1 g in sodium chloride  0.9 % 100 mL IVPB (0 g Intravenous Stopped 06/23/24 1813)     IMPRESSION / MDM / ASSESSMENT AND PLAN / ED COURSE  I reviewed the triage vital signs and the nursing notes.  Differential diagnosis includes, but is not limited to, cellulitis, PAD, PVD, fracture, metastatic disease, necrotizing fasciitis, sepsis  Patient's presentation is most consistent with acute illness / injury with system symptoms.   Medications given: Rocephin  1 g IV  Comprehensive metabolic panel has decreased sodium indicating hyponatremia, patient states it is always low due to the amount of colon they removed when he had colon cancer.  Alk phosphatase slightly increased compared to his past labs is normal, remainder of his labs are in his normal trend.  CBC has normal WBC so no concerns for sepsis are decreased   X-ray right lower extremity independently reviewed interpreted by me as being negative for any acute abnormality  Since the cellulitis is in a small area we will go ahead and treat the patient with IV antibiotics here in the ED due to his high risk factors of diabetes etc.  He was given Rocephin  1 g IV.  Discharged to home with doxycycline  100 twice daily for 7 days.  No red flags to do admission today, however he was given strict instructions to return the emergency department if worsening cellulitis.  Explained to him that cellulitis and diabetes can be very  dangerous.  He is in agreement with this treatment plan.  Discharged in stable condition.   FINAL CLINICAL IMPRESSION(S) / ED DIAGNOSES   Final diagnoses:  Acute cellulitis     Rx / DC Orders   ED Discharge Orders          Ordered    doxycycline  (VIBRA -TABS) 100 MG tablet  2 times daily        06/23/24 1635             Note:  This document was prepared using Dragon voice recognition software and may include unintentional dictation errors.    Gasper Devere ORN, PA-C 06/23/24 1818    Levander Slate, MD 06/23/24 [phone]

## 2024-06-23 NOTE — ED Triage Notes (Signed)
 Patient to ED via POV for right leg redness and swelling. Hx of lymphoma, diabetes. PT states he believe he has an infection from where he cut his toenails.

## 2024-07-03 DIAGNOSIS — C23 Malignant neoplasm of gallbladder: Principal | ICD-10-CM

## 2024-07-07 DIAGNOSIS — C23 Malignant neoplasm of gallbladder: Principal | ICD-10-CM

## 2024-07-14 DIAGNOSIS — C23 Malignant neoplasm of gallbladder: Principal | ICD-10-CM

## 2024-07-16 DIAGNOSIS — C23 Malignant neoplasm of gallbladder: Principal | ICD-10-CM

## 2024-07-20 DIAGNOSIS — C23 Malignant neoplasm of gallbladder: Principal | ICD-10-CM

## 2024-07-22 DIAGNOSIS — C23 Malignant neoplasm of gallbladder: Principal | ICD-10-CM

## 2024-07-24 DIAGNOSIS — C23 Malignant neoplasm of gallbladder: Principal | ICD-10-CM

## 2024-08-06 ENCOUNTER — Ambulatory Visit: Admit: 2024-08-06 | Payer: Medicare (Managed Care)

## 2024-08-06 ENCOUNTER — Ambulatory Visit: Admit: 2024-08-06 | Payer: Medicare (Managed Care) | Attending: Hematology & Oncology | Primary: Hematology & Oncology

## 2024-09-07 ENCOUNTER — Other Ambulatory Visit: Payer: Self-pay

## 2024-09-07 ENCOUNTER — Emergency Department

## 2024-09-07 ENCOUNTER — Inpatient Hospital Stay
Admission: EM | Admit: 2024-09-07 | Discharge: 2024-09-09 | DRG: 638 | Disposition: A | Attending: Hospitalist | Admitting: Hospitalist

## 2024-09-07 DIAGNOSIS — Y92012 Bathroom of single-family (private) house as the place of occurrence of the external cause: Secondary | ICD-10-CM | POA: Diagnosis not present

## 2024-09-07 DIAGNOSIS — F419 Anxiety disorder, unspecified: Secondary | ICD-10-CM | POA: Diagnosis present

## 2024-09-07 DIAGNOSIS — E1122 Type 2 diabetes mellitus with diabetic chronic kidney disease: Secondary | ICD-10-CM | POA: Diagnosis present

## 2024-09-07 DIAGNOSIS — S0083XA Contusion of other part of head, initial encounter: Secondary | ICD-10-CM

## 2024-09-07 DIAGNOSIS — W182XXA Fall in (into) shower or empty bathtub, initial encounter: Secondary | ICD-10-CM | POA: Diagnosis present

## 2024-09-07 DIAGNOSIS — R54 Age-related physical debility: Secondary | ICD-10-CM | POA: Diagnosis present

## 2024-09-07 DIAGNOSIS — E860 Dehydration: Secondary | ICD-10-CM | POA: Diagnosis present

## 2024-09-07 DIAGNOSIS — E872 Acidosis, unspecified: Secondary | ICD-10-CM | POA: Diagnosis present

## 2024-09-07 DIAGNOSIS — Z7984 Long term (current) use of oral hypoglycemic drugs: Secondary | ICD-10-CM | POA: Diagnosis not present

## 2024-09-07 DIAGNOSIS — G4733 Obstructive sleep apnea (adult) (pediatric): Secondary | ICD-10-CM | POA: Diagnosis present

## 2024-09-07 DIAGNOSIS — C830A Small cell b-cell lymphoma, in remission: Secondary | ICD-10-CM | POA: Diagnosis present

## 2024-09-07 DIAGNOSIS — S0003XA Contusion of scalp, initial encounter: Secondary | ICD-10-CM | POA: Diagnosis present

## 2024-09-07 DIAGNOSIS — R739 Hyperglycemia, unspecified: Secondary | ICD-10-CM

## 2024-09-07 DIAGNOSIS — Z885 Allergy status to narcotic agent status: Secondary | ICD-10-CM | POA: Diagnosis not present

## 2024-09-07 DIAGNOSIS — E1165 Type 2 diabetes mellitus with hyperglycemia: Principal | ICD-10-CM | POA: Diagnosis present

## 2024-09-07 DIAGNOSIS — Z932 Ileostomy status: Secondary | ICD-10-CM | POA: Diagnosis not present

## 2024-09-07 DIAGNOSIS — S069XAA Unspecified intracranial injury with loss of consciousness status unknown, initial encounter: Secondary | ICD-10-CM | POA: Diagnosis present

## 2024-09-07 DIAGNOSIS — Z9049 Acquired absence of other specified parts of digestive tract: Secondary | ICD-10-CM | POA: Diagnosis not present

## 2024-09-07 DIAGNOSIS — E785 Hyperlipidemia, unspecified: Secondary | ICD-10-CM | POA: Diagnosis present

## 2024-09-07 DIAGNOSIS — N1831 Chronic kidney disease, stage 3a: Secondary | ICD-10-CM | POA: Diagnosis present

## 2024-09-07 DIAGNOSIS — E875 Hyperkalemia: Secondary | ICD-10-CM | POA: Diagnosis present

## 2024-09-07 DIAGNOSIS — Z800 Family history of malignant neoplasm of digestive organs: Secondary | ICD-10-CM | POA: Diagnosis not present

## 2024-09-07 DIAGNOSIS — W19XXXA Unspecified fall, initial encounter: Principal | ICD-10-CM

## 2024-09-07 DIAGNOSIS — N179 Acute kidney failure, unspecified: Secondary | ICD-10-CM | POA: Diagnosis present

## 2024-09-07 DIAGNOSIS — E871 Hypo-osmolality and hyponatremia: Secondary | ICD-10-CM | POA: Diagnosis present

## 2024-09-07 DIAGNOSIS — Z882 Allergy status to sulfonamides status: Secondary | ICD-10-CM | POA: Diagnosis not present

## 2024-09-07 DIAGNOSIS — K219 Gastro-esophageal reflux disease without esophagitis: Secondary | ICD-10-CM | POA: Diagnosis present

## 2024-09-07 DIAGNOSIS — Z794 Long term (current) use of insulin: Secondary | ICD-10-CM | POA: Diagnosis not present

## 2024-09-07 DIAGNOSIS — Z79899 Other long term (current) drug therapy: Secondary | ICD-10-CM | POA: Diagnosis not present

## 2024-09-07 DIAGNOSIS — K90822 Short bowel syndrome without colon in continuity: Secondary | ICD-10-CM | POA: Diagnosis present

## 2024-09-07 DIAGNOSIS — R55 Syncope and collapse: Secondary | ICD-10-CM | POA: Diagnosis present

## 2024-09-07 DIAGNOSIS — Z91148 Patient's other noncompliance with medication regimen for other reason: Secondary | ICD-10-CM

## 2024-09-07 LAB — BASIC METABOLIC PANEL WITH GFR
Anion gap: 13 (ref 5–15)
Anion gap: 11 (ref 5–15)
BUN: 47 mg/dL — ABNORMAL HIGH (ref 8–23)
BUN: 42 mg/dL — ABNORMAL HIGH (ref 8–23)
CO2: 18 mmol/L — ABNORMAL LOW (ref 22–32)
CO2: 21 mmol/L — ABNORMAL LOW (ref 22–32)
Calcium: 9.3 mg/dL (ref 8.9–10.3)
Calcium: 8.2 mg/dL — ABNORMAL LOW (ref 8.9–10.3)
Chloride: 86 mmol/L — ABNORMAL LOW (ref 98–111)
Chloride: 95 mmol/L — ABNORMAL LOW (ref 98–111)
Creatinine, Ser: 2.18 mg/dL — ABNORMAL HIGH (ref 0.61–1.24)
Creatinine, Ser: 1.96 mg/dL — ABNORMAL HIGH (ref 0.61–1.24)
GFR, Estimated: 32 mL/min — ABNORMAL LOW (ref >60)
GFR, Estimated: 36 mL/min — ABNORMAL LOW (ref >60)
Glucose, Bld: 582 mg/dL (ref 70–99)
Glucose, Bld: 199 mg/dL — ABNORMAL HIGH (ref 70–99)
Potassium: 5.9 mmol/L — ABNORMAL HIGH (ref 3.5–5.1)
Potassium: 4.4 mmol/L (ref 3.5–5.1)
Sodium: 118 mmol/L — CL (ref 135–145)
Sodium: 127 mmol/L — ABNORMAL LOW (ref 135–145)

## 2024-09-07 LAB — URINALYSIS, ROUTINE W REFLEX MICROSCOPIC
Bilirubin Urine: NEGATIVE
Glucose, UA: >=500 mg/dL — AB
Hgb urine dipstick: NEGATIVE
Ketones, ur: NEGATIVE mg/dL
Leukocytes,Ua: NEGATIVE
Nitrite: NEGATIVE
Protein, ur: NEGATIVE mg/dL
Specific Gravity, Urine: 1.008 (ref 1.005–1.030)
pH: 6 (ref 5.0–8.0)

## 2024-09-07 LAB — CBC WITH DIFFERENTIAL/PLATELET
Abs Immature Granulocytes: 0.03 K/uL (ref 0.00–0.07)
Basophils Absolute: 0.1 K/uL (ref 0.0–0.1)
Basophils Relative: 1 %
Eosinophils Absolute: 0.1 K/uL (ref 0.0–0.5)
Eosinophils Relative: 2 %
HCT: 38.8 % — ABNORMAL LOW (ref 39.0–52.0)
Hemoglobin: 14.1 g/dL (ref 13.0–17.0)
Immature Granulocytes: 1 %
Lymphocytes Relative: 19 %
Lymphs Abs: 0.9 K/uL (ref 0.7–4.0)
MCH: 28 pg (ref 26.0–34.0)
MCHC: 36.3 g/dL — ABNORMAL HIGH (ref 30.0–36.0)
MCV: 77.1 fL — ABNORMAL LOW (ref 80.0–100.0)
Monocytes Absolute: 0.4 K/uL (ref 0.1–1.0)
Monocytes Relative: 10 %
Neutro Abs: 3.2 K/uL (ref 1.7–7.7)
Neutrophils Relative %: 67 %
Platelets: 136 K/uL — ABNORMAL LOW (ref 150–400)
RBC: 5.03 MIL/uL (ref 4.22–5.81)
RDW: 12.2 % (ref 11.5–15.5)
WBC: 4.7 K/uL (ref 4.0–10.5)
nRBC: 0 % (ref 0.0–0.2)

## 2024-09-07 LAB — TROPONIN T, HIGH SENSITIVITY
Troponin T High Sensitivity: 28 ng/L — ABNORMAL HIGH (ref 0–19)
Troponin T High Sensitivity: 27 ng/L — ABNORMAL HIGH (ref 0–19)

## 2024-09-07 LAB — CBG MONITORING, ED
Glucose-Capillary: 418 mg/dL — ABNORMAL HIGH (ref 70–99)
Glucose-Capillary: 191 mg/dL — ABNORMAL HIGH (ref 70–99)
Glucose-Capillary: 328 mg/dL — ABNORMAL HIGH (ref 70–99)
Glucose-Capillary: 303 mg/dL — ABNORMAL HIGH (ref 70–99)

## 2024-09-07 LAB — BETA-HYDROXYBUTYRIC ACID: Beta-Hydroxybutyric Acid: 0.07 mmol/L (ref 0.05–0.27)

## 2024-09-07 LAB — LACTIC ACID, PLASMA: Lactic Acid, Venous: 2 mmol/L (ref 0.5–1.9)

## 2024-09-07 LAB — OSMOLALITY: Osmolality: 291 mosm/kg (ref 275–295)

## 2024-09-07 LAB — MAGNESIUM: Magnesium: 1.7 mg/dL (ref 1.7–2.4)

## 2024-09-07 MED ORDER — SODIUM ZIRCONIUM CYCLOSILICATE 10 G PO PACK
10.0000 g | PACK | Freq: Once | ORAL | Status: AC
  Administered 2024-09-07: 15:00:00 10 g via ORAL
  Filled 2024-09-07: qty 1

## 2024-09-07 MED ORDER — INSULIN ASPART 100 UNIT/ML IJ SOLN
10.0000 [IU] | Freq: Once | INTRAMUSCULAR | Status: AC
  Administered 2024-09-07: 13:00:00 10 [IU] via INTRAVENOUS
  Filled 2024-09-07: qty 10

## 2024-09-07 MED ORDER — ATORVASTATIN CALCIUM 20 MG PO TABS
40.0000 mg | ORAL_TABLET | Freq: Every day | ORAL | Status: DC
  Administered 2024-09-08 – 2024-09-09 (×2): 40 mg via ORAL
  Filled 2024-09-07 (×2): qty 2

## 2024-09-07 MED ORDER — SODIUM CHLORIDE 0.9 % IV BOLUS
1000.0000 mL | Freq: Once | INTRAVENOUS | Status: AC
  Administered 2024-09-07: 12:00:00 1000 mL via INTRAVENOUS

## 2024-09-07 MED ORDER — INSULIN ASPART 100 UNIT/ML IJ SOLN
0.0000 [IU] | Freq: Three times a day (TID) | INTRAMUSCULAR | Status: DC
  Administered 2024-09-07: 17:00:00 7 [IU] via SUBCUTANEOUS
  Administered 2024-09-08: 08:00:00 3 [IU] via SUBCUTANEOUS
  Filled 2024-09-07: qty 3
  Filled 2024-09-07: qty 7

## 2024-09-07 MED ORDER — DEXTROSE IN LACTATED RINGERS 5 % IV SOLN: INTRAVENOUS | Status: DC

## 2024-09-07 MED ORDER — INSULIN REGULAR(HUMAN) IN NACL 100-0.9 UT/100ML-% IV SOLN: INTRAVENOUS | Status: DC

## 2024-09-07 MED ORDER — LACTATED RINGERS IV SOLN: INTRAVENOUS | Status: AC

## 2024-09-07 MED ORDER — MIDODRINE HCL 5 MG PO TABS
5.0000 mg | ORAL_TABLET | Freq: Two times a day (BID) | ORAL | Status: DC
  Administered 2024-09-07 – 2024-09-09 (×4): 5 mg via ORAL
  Filled 2024-09-07 (×4): qty 1

## 2024-09-07 MED ORDER — PANTOPRAZOLE SODIUM 40 MG PO TBEC
40.0000 mg | DELAYED_RELEASE_TABLET | Freq: Every day | ORAL | Status: DC
  Administered 2024-09-08 – 2024-09-09 (×2): 40 mg via ORAL
  Filled 2024-09-07 (×2): qty 1

## 2024-09-07 MED ORDER — VENLAFAXINE HCL ER 75 MG PO CP24
150.0000 mg | ORAL_CAPSULE | Freq: Every day | ORAL | Status: DC
  Administered 2024-09-08 – 2024-09-09 (×2): 150 mg via ORAL
  Filled 2024-09-07: qty 1
  Filled 2024-09-07: qty 2

## 2024-09-07 MED ORDER — HEPARIN SODIUM (PORCINE) 5000 UNIT/ML IJ SOLN
5000.0000 [IU] | Freq: Three times a day (TID) | INTRAMUSCULAR | Status: DC
  Administered 2024-09-08 – 2024-09-09 (×4): 5000 [IU] via SUBCUTANEOUS
  Filled 2024-09-07 (×4): qty 1

## 2024-09-07 MED ORDER — INSULIN GLARGINE 100 UNIT/ML ~~LOC~~ SOLN
33.0000 [IU] | Freq: Every day | SUBCUTANEOUS | Status: DC
  Administered 2024-09-08 – 2024-09-09 (×2): 33 [IU] via SUBCUTANEOUS
  Filled 2024-09-07 (×3): qty 0.33

## 2024-09-07 MED ORDER — DEXTROSE 50 % IV SOLN: 0.0000 mL | INTRAVENOUS | Status: DC | PRN

## 2024-09-07 MED ORDER — SODIUM CHLORIDE 0.9 % IV BOLUS
1000.0000 mL | Freq: Once | INTRAVENOUS | Status: AC
  Administered 2024-09-07: 10:00:00 1000 mL via INTRAVENOUS

## 2024-09-07 MED ORDER — CYCLOBENZAPRINE HCL 10 MG PO TABS: 10.0000 mg | ORAL_TABLET | Freq: Three times a day (TID) | ORAL | Status: DC | PRN

## 2024-09-07 NOTE — ED Notes (Signed)
Pt given box meal

## 2024-09-07 NOTE — ED Triage Notes (Signed)
 Pt comes with c/o fall while getting in shower this morning. Pt states he hit his head and knees.  Pt abrasion on right hand. Pt denies any loc

## 2024-09-07 NOTE — ED Notes (Signed)
 Called CCMD for central monitoring at this time

## 2024-09-07 NOTE — ED Notes (Signed)
 Patient transported to CT

## 2024-09-07 NOTE — ED Notes (Signed)
 Pt ambulatory to restroom pushing wheelchair. Pt wife with him. Pt request to use restroom alone.

## 2024-09-07 NOTE — H&P (Signed)
 History and Physical    Patient: [name] DOB: [birth year] DOA: 09/07/2024 DOS: the patient was seen and examined on 09/07/2024 PCP: Ernie Yancy Roof, MD  Patient coming from: Home  Chief Complaint:  Chief Complaint  Patient presents with   Fall   HPI: [name] is a 68 y.o. 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  Patient presents to the emergency room after near syncopal event yesterday while taking a shower.  He admits that he did not pass out. He had denied hitting his head. He was able to catch his fall and landed on his wrist.  Denies any loss of function of his wrist joint.  Admits to some pain but nothing unbearable.  He did not seek medical attention last night but decided to come to the emergency room today to be further evaluated after he had experiences another spell of lightheadedness this morning.  He denied any associated chest pain or palpitation.  Denied any nausea or vomiting.  In the ED, workup revealed markedly elevated hyperglycemia with multiple electrolyte maladies.  He admitted to being noncompliant with his insulin  regimen at home.  Admits he has not been drinking as much fluids over the past week.  He does not give any reason why.  Denied feeling ill or sick over the past week.   Review of Systems: As mentioned in the history of present illness. All other systems reviewed and are negative. Past Medical History:  Diagnosis Date   Cancer (HCC)    Diabetes mellitus without complication [facility])    Past Surgical History:  Procedure Laterality Date   COLON SURGERY     ERCP N/A 01/17/2024   Procedure: ERCP, WITH INTERVENTION IF INDICATED;  Surgeon: Jinny Carmine, MD;  Location: [facility] ENDOSCOPY;  Service: Endoscopy;  Laterality: N/A;   OTHER SURGICAL HISTORY     J pouch 1997, history of ileostomy with  reversal   POUCHOSCOPY N/A 04/10/2022   Procedure: POUCHOSCOPY;  Surgeon: Onita Elspeth Sharper, DO;  Location: [facility] ENDOSCOPY;  Service: Gastroenterology;  Laterality: N/A;   Social History:  reports that he has never smoked. He has never used smokeless tobacco. He reports that he does not drink alcohol and does not use drugs.  Allergies[1]  Family History  Problem Relation Age of Onset   Colon cancer Sister    Crohn's disease Brother     Prior to Admission medications  Medication Sig Start Date End Date Taking? Authorizing Provider  albuterol  (VENTOLIN  HFA) 108 (90 Base) MCG/ACT inhaler Inhale 2 puffs into the lungs every 2 (two) hours as needed for wheezing or shortness of breath. 07/27/22  Yes Alexander, Natalie, DO  atorvastatin  (LIPITOR) 40 MG tablet Take 40 mg by mouth daily. 11/26/18  Yes [provider]  fluticasone (FLONASE) 50 MCG/ACT nasal spray Place 1 spray into both nostrils daily. 08/11/24  Yes [provider]  Glucagon 3 MG/DOSE POWD Place 1 spray into both nostrils as directed. 06/13/23  Yes [provider]  hydrOXYzine  (ATARAX ) 25 MG tablet Take 25 mg by mouth 2 (two) times daily.   Yes [provider]  ibuprofen  (ADVIL ) 200 MG tablet Take 200 mg by mouth every 6 (six) hours as needed for fever, headache or mild pain (pain score 1-3).   Yes [provider]  JARDIANCE  10 MG TABS tablet Take 10 mg by mouth daily. 03/28/22  Yes [provider]  LANTUS   SOLOSTAR 100 UNIT/ML Solostar Pen Inject 28 Units into the skin daily. At 12 noon 01/18/24  Yes Wieting, Richard, MD  LORazepam  (ATIVAN ) 0.5 MG tablet Take 0.5 mg by mouth daily as needed.   Yes [provider]  midodrine  (PROAMATINE ) 5 MG tablet Take 5 mg by mouth 2 (two) times daily.   Yes [provider]  omeprazole (PRILOSEC) 40 MG capsule Take 40 mg by mouth daily.    Yes [provider]  ondansetron  (ZOFRAN ) 8 MG tablet Take 8 mg by mouth every 8  (eight) hours as needed for nausea. 02/28/23  Yes [provider]  tiZANidine (ZANAFLEX) 4 MG tablet Take 4 mg by mouth every 6 (six) hours as needed for muscle spasms.   Yes [provider]  venlafaxine  XR (EFFEXOR -XR) 150 MG 24 hr capsule Take 150 mg by mouth daily. 03/20/17  Yes [provider]  cyclobenzaprine  (FLEXERIL ) 10 MG tablet Take 1 tablet (10 mg total) by mouth 3 (three) times daily as needed for muscle spasms. Patient not taking: Reported on 09/07/2024 07/27/22   Marsa Edelman, DO    Physical Exam: Vitals:   09/07/24 1000 09/07/24 1105 09/07/24 1110 09/07/24 1227  BP: 96/62 (!) 78/45 (!) 81/53 105/62  Pulse: 73 72 72 78  Resp:   18 18  Temp:      SpO2: 100% 100% 100% 98%  Weight:      Height:       Frail-appearing gentleman who was seen laying comfortably in bed eating his lunch.   HEENT: Head is atraumatic, pupils are round reactive to light accommodation. Neck: Supple with no JVD Chest clinically clear with no added sounds Abdomen: Flat soft nontender with no organomegaly Extremities without any pedal edema CNS shows no focal deficits Skin negative for any new rash  Data Reviewed: Sodium was 118, potassium 5.9, chloride is 86, bicarb is 18, glucose is 582, BUN 47, creatinine 2.18, calcium  9.3, magnesium  1.7 eGFR 32, troponin sensitivity 27 and 28 respectively, lactic acid was 2.0 WBCs 4.7, hemoglobin 14.1, hematocrit 38, MCV 77, platelet count is 136, neutrophils 67, BHA was 0.07.  Assessment and Plan:   68 year old gentleman with history of short gut syndrome, multiple admissions the past for dehydration.  Admitted on account of lightheadedness and near syncopal event at home.  Labs significant for hyperglycemic crisis with multiple electrolyte abnormalities.  #1.  Hyperglycemic crisis in the context of noncompliance with his insulin  regimen at home. Not in DKA or HHS. At baseline, patient is on insulin  glargine 33 units.  In the ED,  blood glucose was greater than 500.  Patient was given IV bolus of insulin  with significant reversal of blood glucose.  #2.  Profound hyponatremia: Euvolemic.Likely pseudohyponatremia secondary to hyperglycemia. Other possible contributory factors include dehydration and possible underlying SIADH.  Patient will be volume repleted at this time.  Repeat BMP shows a significant proved meant.  Will consult with nephrology to help with further management.  #3.  Hyperkalemia: Likely due to metabolic acidosis.  Trial of Lokelma  will be offered.  Monitor serum potassium after Lokelma  is offered.\  #4.  History of colectomy with short gut syndrome.  Patient is high risk of dehydration.  #5. Acute on chronic Kidney injury:  Most likely due to dehydration. Patient will be volume repleted as appropriate. Repeat BMP shows improved   #6. Near syncope most likely due to multiple acute abnormalities and dehydration.  Will check orthostasis in AM after patient has been adequately volume  repleted.  PT and OT to work with patient to ensure stability   Advance Care Planning:   Code Status: Full Code   Consults: Nephrology consult  Family Communication: No family at bedside  Severity of Illness: The appropriate patient status for this patient is INPATIENT. Inpatient status is judged to be reasonable and necessary in order to provide the required intensity of service to ensure the patient's safety. The patient's presenting symptoms, physical exam findings, and initial radiographic and laboratory data in the context of their chronic comorbidities is felt to place them at high risk for further clinical deterioration. Furthermore, it is not anticipated that the patient will be medically stable for discharge from the hospital within 2 midnights of admission.   * I certify that at the point of admission it is my clinical judgment that the patient will require inpatient hospital care spanning beyond 2 midnights from the  point of admission due to high intensity of service, high risk for further deterioration and high frequency of surveillance required.*  Author: Maude MARLA Dart, MD 09/07/2024 2:36 PM  For on call review [email or web address].      [1]  Allergies Allergen Reactions   Sulfa Antibiotics Rash and Hives   Oxycodone  Other (See Comments) and Rash    Rash and headache

## 2024-09-07 NOTE — ED Provider Notes (Signed)
 [facility] Provider Note    Event Date/Time   First MD Initiated Contact with Patient 09/07/24 [phone]     (approximate)   History   Fall   HPI  [name] is a 68 y.o. male presents to the emergency department today because of a concern for a fall and lightheadedness that started yesterday.  Patient states he was getting into the shower.  There is a threshold that he thinks he might of tripped him.  Although he states he is not sure if he became lightheaded before the fall or if the lightheadedness started after the fall.  The patient says he has complicated history of multiple cancers.  He is concerned about the lightheadedness.  He did not come last night because of the cold and darkness.     Physical Exam   Triage Vital Signs: ED Triage Vitals  Encounter Vitals Group     BP 09/07/24 0745 119/84     Girls Systolic BP Percentile --      Girls Diastolic BP Percentile --      Boys Systolic BP Percentile --      Boys Diastolic BP Percentile --      Pulse Rate 09/07/24 0745 (!) 104     Resp 09/07/24 0745 18     Temp 09/07/24 0745 98 F (36.7 C)     Temp src --      SpO2 09/07/24 0745 100 %     Weight 09/07/24 0744 160 lb (72.6 kg)     Height 09/07/24 0744 5' 9.5 (1.765 m)     Head Circumference --      Peak Flow --      Pain Score 09/07/24 0745 5     Pain Loc --      Pain Education --      Exclude from Growth Chart --     Most recent vital signs: Vitals:   09/07/24 0745  BP: 119/84  Pulse: (!) 104  Resp: 18  Temp: 98 F (36.7 C)  SpO2: 100%   General: Awake, alert, oriented. CV:  Good peripheral perfusion. Regular rate and rhythm. Resp:  Normal effort. Lungs clear. Abd:  No distention.  Other:  Hematoma to right forehead.   ED Results / Procedures / Treatments   Labs (all labs ordered are listed, but only abnormal results are displayed) Labs Reviewed  CBC WITH DIFFERENTIAL/PLATELET - Abnormal; Notable for the following  components:      Result Value   HCT 38.8 (*)    MCV 77.1 (*)    MCHC 36.3 (*)    Platelets 136 (*)    All other components within normal limits  BASIC METABOLIC PANEL WITH GFR - Abnormal; Notable for the following components:   Sodium 118 (*)    Potassium 5.9 (*)    Chloride 86 (*)    CO2 18 (*)    Glucose, Bld 582 (*)    BUN 47 (*)    Creatinine, Ser 2.18 (*)    GFR, Estimated 32 (*)    All other components within normal limits  LACTIC ACID, PLASMA - Abnormal; Notable for the following components:   Lactic Acid, Venous 2.0 (*)    All other components within normal limits  CBG MONITORING, ED - Abnormal; Notable for the following components:   Glucose-Capillary 418 (*)    All other components within normal limits  TROPONIN T, HIGH SENSITIVITY - Abnormal; Notable for the following components:   Troponin  T High Sensitivity 28 (*)    All other components within normal limits  TROPONIN T, HIGH SENSITIVITY - Abnormal; Notable for the following components:   Troponin T High Sensitivity 27 (*)    All other components within normal limits  CULTURE, BLOOD (ROUTINE X 2)  CULTURE, BLOOD (ROUTINE X 2)     EKG  I, Guadalupe Eagles, attending physician, personally viewed and interpreted this EKG  EKG Time: 0826 Rate: 85 Rhythm: normal sinus rhythm Axis: right axis deviation Intervals: qtc 430 QRS: narrow ST changes: no st elevation Impression: abnormal ekg  RADIOLOGY I independently interpreted and visualized the CT head. My interpretation: No ICH Radiology interpretation: IMPRESSION: 1. No acute intracranial abnormality. 2. Mild right frontal scalp contusion.    PROCEDURES:  Critical Care performed: Yes  CRITICAL CARE Performed by: Guadalupe Eagles   Total critical care time: 30 minutes  Critical care time was exclusive of separately billable procedures and treating other patients.  Critical care was necessary to treat or prevent imminent or life-threatening  deterioration.  Critical care was time spent personally by me on the following activities: development of treatment plan with patient and/or surrogate as well as nursing, discussions with consultants, evaluation of patient's response to treatment, examination of patient, obtaining history from patient or surrogate, ordering and performing treatments and interventions, ordering and review of laboratory studies, ordering and review of radiographic studies, pulse oximetry and re-evaluation of patient's condition.   Procedures    MEDICATIONS ORDERED IN ED: Medications - No data to display   IMPRESSION / MDM / ASSESSMENT AND PLAN / ED COURSE  I reviewed the triage vital signs and the nursing notes.                              Differential diagnosis includes, but is not limited to, ICH, fracture, anemia, electrolyte abnormality, dehydration, hyperglycemia  Patient's presentation is most consistent with acute presentation with potential threat to life or bodily function.   The patient is on the cardiac monitor to evaluate for evidence of arrhythmia and/or significant heart rate changes.  Patient presented to the emergency department today because of concerns for fall, head injury and lightheadedness.  Fall occurred yesterday and patient does have a hematoma to the forehead.  CT scan of the head however without any concerning intracranial hemorrhage or fracture.  Given patient's complaint of lightheadedness blood work and EKG were ordered.  Initial troponin was slightly elevated however repeat was stable.  EKG without concerning findings for ST elevation MI.  Blood work however was notable for significant hyperglycemia.  Patient also with hyponatremia even after correction for hyperglycemia.  Slight AKI.  Patient was started on IV fluids.  While here patient's blood pressure did drop however did seem to respond to IV fluids.  Do think patient would benefit from further workup and management.  Will  discuss with hospitalist service for admission.  Discussed plan with patient.      FINAL CLINICAL IMPRESSION(S) / ED DIAGNOSES   Final diagnoses:  Fall, initial encounter  Traumatic hematoma of forehead, initial encounter  Hyperglycemia  AKI (acute kidney injury)        Rx / DC Orders   ED Discharge Orders     None        Note:  This document was prepared using Dragon voice recognition software and may include unintentional dictation errors.    Eagles Guadalupe, MD 09/07/24 [phone]

## 2024-09-08 DIAGNOSIS — N179 Acute kidney failure, unspecified: Secondary | ICD-10-CM | POA: Insufficient documentation

## 2024-09-08 DIAGNOSIS — E872 Acidosis, unspecified: Secondary | ICD-10-CM | POA: Insufficient documentation

## 2024-09-08 DIAGNOSIS — E785 Hyperlipidemia, unspecified: Secondary | ICD-10-CM | POA: Insufficient documentation

## 2024-09-08 LAB — BASIC METABOLIC PANEL WITH GFR
Anion gap: 8 (ref 5–15)
BUN: 32 mg/dL — ABNORMAL HIGH (ref 8–23)
CO2: 20 mmol/L — ABNORMAL LOW (ref 22–32)
Calcium: 8.1 mg/dL — ABNORMAL LOW (ref 8.9–10.3)
Chloride: 98 mmol/L (ref 98–111)
Creatinine, Ser: 1.55 mg/dL — ABNORMAL HIGH (ref 0.61–1.24)
GFR, Estimated: 48 mL/min — ABNORMAL LOW (ref >60)
Glucose, Bld: 261 mg/dL — ABNORMAL HIGH (ref 70–99)
Potassium: 4.6 mmol/L (ref 3.5–5.1)
Sodium: 126 mmol/L — ABNORMAL LOW (ref 135–145)

## 2024-09-08 LAB — CBG MONITORING, ED
Glucose-Capillary: 275 mg/dL — ABNORMAL HIGH (ref 70–99)
Glucose-Capillary: 226 mg/dL — ABNORMAL HIGH (ref 70–99)
Glucose-Capillary: 223 mg/dL — ABNORMAL HIGH (ref 70–99)
Glucose-Capillary: 181 mg/dL — ABNORMAL HIGH (ref 70–99)
Glucose-Capillary: 115 mg/dL — ABNORMAL HIGH (ref 70–99)

## 2024-09-08 LAB — GLUCOSE, CAPILLARY: Glucose-Capillary: 442 mg/dL — ABNORMAL HIGH (ref 70–99)

## 2024-09-08 MED ORDER — LACTATED RINGERS IV SOLN: INTRAVENOUS | Status: DC

## 2024-09-08 MED ORDER — INSULIN ASPART 100 UNIT/ML IJ SOLN
0.0000 [IU] | Freq: Three times a day (TID) | INTRAMUSCULAR | Status: DC
  Administered 2024-09-08: 12:00:00 2 [IU] via SUBCUTANEOUS
  Administered 2024-09-09: 12:00:00 1 [IU] via SUBCUTANEOUS
  Administered 2024-09-09: 09:00:00 2 [IU] via SUBCUTANEOUS
  Filled 2024-09-08 (×2): qty 2
  Filled 2024-09-08: qty 1

## 2024-09-08 MED ORDER — INSULIN ASPART 100 UNIT/ML IJ SOLN
0.0000 [IU] | Freq: Every day | INTRAMUSCULAR | Status: DC
  Administered 2024-09-08: 21:00:00 5 [IU] via SUBCUTANEOUS
  Filled 2024-09-08: qty 5

## 2024-09-08 MED ORDER — SODIUM BICARBONATE 650 MG PO TABS
650.0000 mg | ORAL_TABLET | Freq: Two times a day (BID) | ORAL | Status: AC
  Administered 2024-09-08 – 2024-09-09 (×3): 650 mg via ORAL
  Filled 2024-09-08 (×3): qty 1

## 2024-09-08 NOTE — Assessment & Plan Note (Signed)
 Venlafaxine  150 mg daily with breakfast

## 2024-09-08 NOTE — Assessment & Plan Note (Signed)
 12/16: Sodium bicarbonate  650 mg p.o. twice daily, 3 doses ordered

## 2024-09-08 NOTE — Assessment & Plan Note (Signed)
 Corrected serum sodium is 130 today, improved slowly from yesterday

## 2024-09-08 NOTE — Hospital Course (Addendum)
 Mr. [name] is a 68 year old male with short gut syndrome secondary to hemicolectomy, history of lymphoma, insulin -dependent diabetes mellitus, recurrent hyponatremia.  12/15: Patient presented to ED for chief concerns of syncopal event while in the shower.  However he states he did not pass out.  12/15: Patient admitted to hospitalist service for hyperglycemic crisis in the context of noncompliance without DKA or HHS.  Patient was given insulin  glargine 33 units.  Patient also admitted for hyponatremia, suspect pseudohyponatremia.  Serum sodium noted to be 118 yesterday.  12/16: I assumed the care of the patient.  Serum sodium today is 126, corrected sodium is 130.  Renal function is improving.  Reorder LR infusion at 125 mL/h, 1 day ordered.  Full liquid diet and then advance to carb modified diet.  Bicarb twice daily initiated.

## 2024-09-08 NOTE — Inpatient Diabetes Management (Addendum)
 Inpatient Diabetes Program Recommendations  AACE/ADA: New Consensus Statement on Inpatient Glycemic Control   Target Ranges:  Prepandial:   less than 140 mg/dL      Peak postprandial:   less than 180 mg/dL (1-2 hours)      Critically ill patients:  140 - 180 mg/dL    Latest Reference Range & Units 09/07/24 12:25 09/07/24 13:58 09/07/24 16:33 09/07/24 19:18 09/08/24 01:50 09/08/24 07:24  Glucose-Capillary 70 - 99 mg/dL 581 (H) 808 (H) 671 (H) 303 (H) 275 (H) 226 (H)    Latest Reference Range & Units 09/07/24 08:35 09/07/24 14:57 09/08/24 03:42  CO2 22 - 32 mmol/L 18 (L) 21 (L) 20 (L)  Glucose 70 - 99 mg/dL 417 (HH) 800 (H) 738 (H)  Anion gap 5 - 15  13 11 8     Latest Reference Range & Units 01/17/24 00:27  Hemoglobin A1C 4.8 - 5.6 % 9.4 (H)   Review of Glycemic Control  Diabetes history: DM2 Outpatient Diabetes medications: Lantus  33 units daily, Jardiance  10 mg daily Current orders for Inpatient glycemic control: Lantus  33 units daily, Novolog  0-9 units TID with meals  Inpatient Diabetes Program Recommendations:    Insulin : No Lantus  given since arrival to hospital and CBG 226 mg/dl at 2:75 am today.   HbgA1C: A1C in process. to evaluate glycemic control over the past 2-3 months. In Care Everywhere, noted last A1C was 10.7% on 02/11/24 (date he last seen Endocrinology).  Outpatient DM: Patient states that he needs Rx for Lantus  Solostar pens [phone]), Jardiance , and new glucose monitoring kit (#[id number])  at discharge.  He requested Rx for medications for 60-90 day supply.  NOTE: Patient admitted 09/07/24 with hyperglycemia crisis (lab glucose 582 mg/dl), hyponatremia, hyperkalemia, near syncope, and acute on chronic kidney injury.  Per chart review, patient sees Dr. Zenon Stallion [facility]) and was seen on 02/11/24. Per office note on 02/11/24 patient was asked to increase Lantus  to 33 units daily and continue Jardiance  10 mg daily.   Addendum 09/08/24@11 :30-Spoke with  patient in ED regarding DM control. Patient states that he has been taking Lantus  33 units QPM and Jardiance  10 mg daily for DM control. Patient states that he takes DM medications at least 99% of the time. Discussed that initial glucose 582 mg/dl on 87/84/74 and patient states it is because he been eating bad and also stated that he needed to get more medication. Asked patient again if he has been taking DM medications since he has noted he needs prescriptions for both Lantus  and Jardiance . Patient stated he has been out of them due to not being able to get back to the [facility] to follow up so he could get medications refilled but then a few sentences later states he has been taking the Lantus  and Jardiance .  Patient gave differing information so not sure if he has been taking DM medications consistently.  Patient asked if he can get Rx for Lantus  and Jardiance  for 3 or 6 months. Explained that the discharging provider from the hospital usually gives Rx for 30 days as the expectation is that he follows up with his PCP so he can continue to get needed Rx for medications and so that his PCP can help with making adjustments with DM medications if needed.  Patient reports it is hard to get an appointment with clinic and he is not sure he will be able to get an appointment within 30 days after discharge. Encouraged patient to go ahead and  call and see if he can get an appointment for follow up. Patient is not checking glucose at home; he reports he use to but he has just got away from checking it. Patient states he does not know if he has all needed DM supplies to check glucose.  Discussed glucose and A1C goals. Discussed importance of checking CBGs and maintaining good CBG control to prevent long-term and short-term complications. Explained how hyperglycemia leads to damage within blood vessels which lead to the common complications seen with uncontrolled diabetes. Stressed to the patient the importance  of improving glycemic control to prevent further complications from uncontrolled diabetes. Encouraged patient to take DM medications as prescribed at discharge and to follow up with PCP.   Patient verbalized understanding of information discussed and reports no further questions at this time related to diabetes.   Thanks, Earnie Gainer, RN, MSN, CDCES Diabetes Coordinator Inpatient Diabetes Program [phone] (Team Pager from 8am to 5pm)

## 2024-09-08 NOTE — Assessment & Plan Note (Signed)
 On CKD 3A Improving, baseline renal is 1.47-1.66 Possibly resolved at this time however will check BMP 1 more day

## 2024-09-08 NOTE — Assessment & Plan Note (Signed)
 PPI

## 2024-09-08 NOTE — ED Notes (Signed)
 Patient got up with wife and walked to restroom and around ER. Patient pushing wheelchair for stability.

## 2024-09-08 NOTE — Assessment & Plan Note (Signed)
 Atorvastatin 40 mg daily

## 2024-09-08 NOTE — Progress Notes (Addendum)
 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  12/15: Patient presented to ED for chief concerns of syncopal event while in the shower.  However he states he did not pass out.  12/15: Patient admitted to hospitalist service for hyperglycemic crisis in the context of noncompliance without DKA or HHS.  Patient was given insulin  glargine 33 units.  Patient also admitted for hyponatremia, suspect pseudohyponatremia.  Serum sodium noted to be 118 yesterday.  12/16: I assumed the care of the patient.  Serum sodium today is 126, corrected sodium is 130.  Renal function is improving.  Reorder LR infusion at 125 mL/h, 1 day ordered.  Full liquid diet and then advance to carb modified diet.  Bicarb twice daily initiated.  Assessment & Plan:   Principal Problem:   Hyperglycemic crisis due to diabetes mellitus (HCC) Active Problems:   Hyponatremia   Obstructive sleep apnea   TBI (traumatic brain injury) (HCC)   GERD without esophagitis   Anxiety and depression   Small cell b-cell lymphoma, in remission (HCC)   Metabolic acidosis   AKI (acute kidney injury)   Hyperlipidemia   Assessment and Plan:  * Hyperglycemic crisis due to diabetes mellitus (HCC) In setting of insulin  Home regimen noncompliance 12/16: Improving Added nightly insulin  correction Continue insulin  SSI, renal dosing. Continue long acting insulin  33 units subcutaneous daily Goal inpatient blood glucose levels 140-180  12/16: Improving glucose level however range has been over 200 Continue SSI with at bedtime coverage  Hyponatremia Corrected serum sodium is 130 today, improved slowly from yesterday  Hyperlipidemia Atorvastatin  40 mg daily  AKI (acute kidney injury) On CKD  3A Improving, baseline renal is 1.47-1.66 Possibly resolved at this time however will check BMP 1 more day  Metabolic acidosis 12/16: Sodium bicarbonate  650 mg p.o. twice daily, 3 doses ordered  Anxiety and depression Venlafaxine  150 mg daily with breakfast  GERD without esophagitis PPI  DVT prophylaxis: Heparin  5000 units subcutaneous q. did our Code Status: Full code Family Communication: Spouse updated at bedside Disposition Plan:  Level of care: Progressive  Consultants:  None at this time  Procedures:  No  Antimicrobials: No  Subjective:  At bedside, patient able to tell me his first last name, age, location, current calendar year.  He reports over the last few days, he may have missed his insulin  due to just getting in a bad place.  Objective: Vitals:   09/08/24 1330 09/08/24 1600 09/08/24 1615 09/08/24 1627  BP: 105/62 111/64    Pulse: 74 83    Resp: 20 (!) 26 17   Temp:    98.2 F (36.8 C)  TempSrc:    Oral  SpO2: 100% 100%    Weight:      Height:        Intake/Output Summary (Last 24 hours) at 09/08/2024 1743 Last data filed at 09/08/2024 0347 Gross per 24 hour  Intake 1000 ml  Output --  Net 1000 ml   Filed Weights   09/07/24 0744  Weight: 72.6 kg   Examination:  General exam: Appears calm and comfortable  Respiratory system: Clear to auscultation. Respiratory effort normal. Cardiovascular system: S1 & S2 heard, RRR. No JVD, murmurs, rubs, gallops or clicks. No pedal edema. Gastrointestinal system: Abdomen is nondistended, soft and nontender. No organomegaly or masses felt.  Normal bowel sounds heard. Central nervous system: Alert and oriented. No focal neurological deficits. Extremities: Symmetric 5 x 5 power. Skin: No rashes, lesions or ulcers Psychiatry: Judgement and insight appear normal. Mood & affect appropriate.   Data Reviewed: I have personally reviewed following labs and imaging studies  CBC: Recent Labs  Lab 09/07/24 0835   WBC 4.7  NEUTROABS 3.2  HGB 14.1  HCT 38.8*  MCV 77.1*  PLT 136*   Basic Metabolic Panel: Recent Labs  Lab 09/07/24 0835 09/07/24 1343 09/07/24 1457 09/08/24 0342  NA 118*  --  127* 126*  K 5.9*  --  4.4 4.6  CL 86*  --  95* 98  CO2 18*  --  21* 20*  GLUCOSE 582*  --  199* 261*  BUN 47*  --  42* 32*  CREATININE 2.18*  --  1.96* 1.55*  CALCIUM  9.3  --  8.2* 8.1*  MG  --  1.7  --   --    GFR: Estimated Creatinine Clearance: 45.7 mL/min (A) (by C-G formula based on SCr of 1.55 mg/dL (H)).  CBG: Recent Labs  Lab 09/08/24 0150 09/08/24 0724 09/08/24 0807 09/08/24 1153 09/08/24 1704  GLUCAP 275* 226* 223* 181* 115*   Sepsis Labs: Recent Labs  Lab 09/07/24 1145  LATICACIDVEN 2.0*   Recent Results (from the past 240 hours)  Blood culture (routine x 2)     Status: None (Preliminary result)   Collection Time: 09/07/24  1:43 PM   Specimen: BLOOD LEFT ARM  Result Value Ref Range Status   Specimen Description BLOOD LEFT ARM  Final   Special Requests   Final    BOTTLES DRAWN AEROBIC AND ANAEROBIC Blood Culture adequate volume   Culture   Final    NO GROWTH < 24 HOURS Performed at [facility], [address] [place], [place] [address]    Report Status PENDING  Incomplete  Blood culture (routine x 2)     Status: None (Preliminary result)   Collection Time: 09/07/24  1:43 PM   Specimen: BLOOD RIGHT ARM  Result Value Ref Range Status   Specimen Description BLOOD RIGHT ARM  Final   Special Requests   Final    BOTTLES DRAWN AEROBIC AND ANAEROBIC Blood Culture adequate volume   Culture   Final    NO GROWTH < 24 HOURS Performed at [facility], [address], [place], [place] [address]    Report Status PENDING  Incomplete    Radiology Studies: CT Head Wo Contrast Result Date: 09/07/2024 EXAM: CT HEAD WITHOUT 09/07/2024 08:41:20 AM TECHNIQUE: CT of the head was performed without the administration of intravenous contrast. Automated exposure  control, iterative reconstruction, and/or weight based adjustment of the mA/kV was utilized to reduce the radiation dose to as low as reasonably achievable. COMPARISON: 06/22/2023 CLINICAL HISTORY: fall FINDINGS: BRAIN AND VENTRICLES: No acute intracranial hemorrhage. No mass effect or midline shift. No extra-axial fluid collection. No evidence of acute infarct. No hydrocephalus. Mild age-related cortical volume loss. ORBITS: No acute abnormality. SINUSES AND MASTOIDS: No acute abnormality. SOFT TISSUES AND SKULL: No acute skull fracture. Mild right frontal scalp contusion. IMPRESSION: 1. No acute intracranial abnormality. 2. Mild right frontal scalp contusion. Electronically signed by: Waddell Calk MD 09/07/2024 08:51 AM EST RP Workstation: [id number]   Scheduled Meds:  atorvastatin   40 mg Oral Daily   heparin   5,000 Units Subcutaneous Q8H   insulin  aspart  0-5 Units Subcutaneous QHS   insulin  aspart  0-9  Units Subcutaneous TID WC   insulin  glargine  33 Units Subcutaneous Daily   midodrine   5 mg Oral BID WC   pantoprazole   40 mg Oral Daily   sodium bicarbonate   650 mg Oral BID   venlafaxine  XR  150 mg Oral Q breakfast   Continuous Infusions:  lactated ringers       LOS: 1 day   Time spent: 50 minutes  Dr. Sherre [facility] If 7PM-7AM, please contact night-coverage 09/08/2024, 5:43 PM

## 2024-09-08 NOTE — Assessment & Plan Note (Addendum)
 In setting of insulin  Home regimen noncompliance 12/16: Improving Added nightly insulin  correction Continue insulin  SSI, renal dosing. Continue long acting insulin  33 units subcutaneous daily Goal inpatient blood glucose levels 140-180  12/16: Improving glucose level however range has been over 200 Continue SSI with at bedtime coverage

## 2024-09-09 ENCOUNTER — Other Ambulatory Visit: Payer: Self-pay

## 2024-09-09 DIAGNOSIS — E1165 Type 2 diabetes mellitus with hyperglycemia: Secondary | ICD-10-CM | POA: Diagnosis not present

## 2024-09-09 LAB — BLOOD GAS, VENOUS
Acid-base deficit: 3.8 mmol/L — ABNORMAL HIGH (ref 0.0–2.0)
Bicarbonate: 24.7 mmol/L (ref 20.0–28.0)
O2 Saturation: 48.9 %
Patient temperature: 37
pCO2, Ven: 59 mmHg (ref 44–60)
pH, Ven: 7.23 — ABNORMAL LOW (ref 7.25–7.43)

## 2024-09-09 LAB — GLUCOSE, CAPILLARY
Glucose-Capillary: 144 mg/dL — ABNORMAL HIGH (ref 70–99)
Glucose-Capillary: 169 mg/dL — ABNORMAL HIGH (ref 70–99)
Glucose-Capillary: 210 mg/dL — ABNORMAL HIGH (ref 70–99)
Glucose-Capillary: 218 mg/dL — ABNORMAL HIGH (ref 70–99)

## 2024-09-09 LAB — BASIC METABOLIC PANEL WITH GFR
Anion gap: 9 (ref 5–15)
BUN: 21 mg/dL (ref 8–23)
CO2: 21 mmol/L — ABNORMAL LOW (ref 22–32)
Calcium: 8.1 mg/dL — ABNORMAL LOW (ref 8.9–10.3)
Chloride: 100 mmol/L (ref 98–111)
Creatinine, Ser: 1.4 mg/dL — ABNORMAL HIGH (ref 0.61–1.24)
GFR, Estimated: 54 mL/min — ABNORMAL LOW (ref >60)
Glucose, Bld: 252 mg/dL — ABNORMAL HIGH (ref 70–99)
Potassium: 4.3 mmol/L (ref 3.5–5.1)
Sodium: 130 mmol/L — ABNORMAL LOW (ref 135–145)

## 2024-09-09 LAB — HEMOGLOBIN A1C
Hgb A1c MFr Bld: >15.5 % — ABNORMAL HIGH (ref 4.8–5.6)
Mean Plasma Glucose: >398 mg/dL

## 2024-09-09 MED ORDER — INSULIN ASPART 100 UNIT/ML IJ SOLN
2.0000 [IU] | Freq: Once | INTRAMUSCULAR | Status: AC
  Administered 2024-09-09: 04:00:00 2 [IU] via SUBCUTANEOUS
  Filled 2024-09-09: qty 2

## 2024-09-09 MED ORDER — LOPERAMIDE HCL 2 MG PO CAPS
2.0000 mg | ORAL_CAPSULE | Freq: Once | ORAL | Status: AC
  Administered 2024-09-09: 09:00:00 2 mg via ORAL
  Filled 2024-09-09: qty 1

## 2024-09-09 MED ORDER — INSULIN LISPRO (1 UNIT DIAL) 100 UNIT/ML (KWIKPEN)
PEN_INJECTOR | SUBCUTANEOUS | 0 refills | Status: AC
  Filled 2024-09-09: qty 6, 22d supply, fill #0

## 2024-09-09 MED ORDER — SODIUM CHLORIDE 1 G PO TABS
1.0000 g | ORAL_TABLET | Freq: Three times a day (TID) | ORAL | 0 refills | Status: AC
  Filled 2024-09-09: qty 21, 7d supply, fill #0

## 2024-09-09 NOTE — Plan of Care (Signed)

## 2024-09-09 NOTE — Discharge Summary (Signed)
 Physician Discharge Summary  [name] FMW:[id number] DOB: May 28, 1956 DOA: 09/07/2024  PCP: Ernie Yancy Roof, MD  Admit date: 09/07/2024 Discharge date: 09/09/2024  Admitted From: Home Disposition: Home  Recommendations for Outpatient Follow-up:  Follow up with PCP in 1 week with repeat CBC/BMP Follow up in ED if symptoms worsen or new appear   Home Health: No Equipment/Devices: None  Discharge Condition: Stable CODE STATUS: Full Diet recommendation: Regular  Brief/Interim Summary:  [name] is a 68 y.o. 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   Patient presents to the emergency room after near syncopal event while taking a shower.  He admits that he did not pass out. He had denied hitting his head. He was able to catch his fall and landed on his wrist.  Denies any loss of function of his wrist joint.  Admits to some pain but nothing unbearable.  He did not seek medical attention last night but decided to come to the emergency room today to be further evaluated after he had experiences another spell of lightheadedness this morning.  He denied any associated chest pain or palpitation.  Denied any nausea or vomiting.   In the ED, workup revealed markedly elevated hyperglycemia >500 with multiple electrolyte abnormalities including low sodium 118, BUN 47, creatinine 2.18, potassium 5.9, bicarb 18.  Patient admitted for severe hyper glycemia, pseudohyponatremia, hyperkalemia and AKI.  Treated with IV fluid.  He was not in any DKA.  Treated with subcu insulin .  Sodium improved to 130 prior to discharge.  He was able to ambulate without any issues /asymptomatic/with good oral intake prior to discharge.  Advised to continue prior to admission medication including Lantus .  Sliding scale NovoLog  ordered for as needed use.  He is also  advised to maintain adequate hydration.  Follow-up with PCP within a week to ensure his stable electrolyte and kidney function.  Discharge Diagnoses:  Principal Problem:   Hyperglycemic crisis due to diabetes mellitus (HCC) Active Problems:   Hyponatremia   Obstructive sleep apnea   TBI (traumatic brain injury) (HCC)   GERD without esophagitis   Anxiety and depression   Small cell b-cell lymphoma, in remission (HCC)   Metabolic acidosis   AKI (acute kidney injury)   Hyperlipidemia  #1.  Hyperglycemic crisis in the context of noncompliance with his insulin  regimen at home. Not in DKA or HHS. At baseline, patient is on insulin  glargine 33 units.  In the ED, blood glucose was greater than 500.  Patient was given IV bolus of insulin  with significant reversal of blood glucose. - Patient will continue Lantus  at home.  Sliding scale NovoLog  ordered.   #2.  Profound hyponatremia: 118 on admission, pseudohyponatremia component.  Sodium improved to 130 prior to discharge.  Prescribed about a week of salt tablets.  Discussed importance of adequate hydration given history of chronic diarrhea with his short gut syndrome  #3.  Hyperkalemia: Resolved.  #4.  History of colectomy with short gut syndrome.  Patient is high risk of dehydration.   #5. Acute on chronic Kidney injury: Again from dehydration.  Resolved  #6. Near syncope most likely due to multiple acute abnormalities and dehydration.  Secondary to above issues.  Patient independent and ambulatory prior to discharge.  Discharge Instructions  Discharge Instructions     Call MD for:  difficulty breathing, headache or visual disturbances   Complete by: As directed    Call  MD for:  extreme fatigue   Complete by: As directed    Call MD for:  persistant dizziness or light-headedness   Complete by: As directed    Call MD for:  persistant nausea and vomiting   Complete by: As directed    Diet Carb Modified   Complete by: As directed     Discharge instructions   Complete by: As directed    1.  Continue medications that you have been taking prior to admission. 2.  start taking salt tablets 3 times a day for about a week. 3.  Follow-up with PCP within a week.  We recommend labs to check BMP, magnesium . 4.  Stay hydrated 5.  Continue insulin  therapy at home.  Continues Lantus  daily, Jardiance  daily.  Monitor blood sugar closely and utilize NovoLog  sliding scale..   Increase activity slowly   Complete by: As directed       Allergies as of 09/09/2024       Reactions   Sulfa Antibiotics Rash, Hives   Oxycodone  Other (See Comments), Rash   Rash and headache        Medication List     TAKE these medications    albuterol  108 (90 Base) MCG/ACT inhaler Commonly known as: VENTOLIN  HFA Inhale 2 puffs into the lungs every 2 (two) hours as needed for wheezing or shortness of breath.   atorvastatin  40 MG tablet Commonly known as: LIPITOR Take 40 mg by mouth daily.   cyclobenzaprine  10 MG tablet Commonly known as: FLEXERIL  Take 1 tablet (10 mg total) by mouth 3 (three) times daily as needed for muscle spasms.   fluticasone 50 MCG/ACT nasal spray Commonly known as: FLONASE Place 1 spray into both nostrils daily.   Glucagon 3 MG/DOSE Powd Place 1 spray into both nostrils as directed.   hydrOXYzine  25 MG tablet Commonly known as: ATARAX  Take 25 mg by mouth 2 (two) times daily.   ibuprofen  200 MG tablet Commonly known as: ADVIL  Take 200 mg by mouth every 6 (six) hours as needed for fever, headache or mild pain (pain score 1-3).   insulin  lispro 100 UNIT/ML KwikPen Commonly known as: HUMALOG  Inject 0-9 Units into the skin 3 times daily with meals per sliding scale directions. CBG < 70: Implement Hypoglycemia Standing Order; CBG 70 - 120: 0 units CBG 121 - 150: 1 unit CBG 151 - 200: 2 units CBG 201 - 250: 3 units CBG 251 - 300: 5 units CBG 301 - 350: 7 units CBG 351 - 400: 9 units CBG > 400: call MD   Jardiance  10  MG Tabs tablet Generic drug: empagliflozin  Take 10 mg by mouth daily.   Lantus  SoloStar 100 UNIT/ML Solostar Pen Generic drug: insulin  glargine Inject 28 Units into the skin daily. At 12 noon   LORazepam  0.5 MG tablet Commonly known as: ATIVAN  Take 0.5 mg by mouth daily as needed.   midodrine  5 MG tablet Commonly known as: PROAMATINE  Take 5 mg by mouth 2 (two) times daily.   omeprazole 40 MG capsule Commonly known as: PRILOSEC Take 40 mg by mouth daily.   ondansetron  8 MG tablet Commonly known as: ZOFRAN  Take 8 mg by mouth every 8 (eight) hours as needed for nausea.   sodium chloride  1 g tablet Take 1 tablet (1 g total) by mouth 3 (three) times daily with meals.   tiZANidine 4 MG tablet Commonly known as: ZANAFLEX Take 4 mg by mouth every 6 (six) hours as needed for muscle spasms.  venlafaxine  XR 150 MG 24 hr capsule Commonly known as: EFFEXOR -XR Take 150 mg by mouth daily.        Allergies[1]  Consultations:    Procedures/Studies: CT Head Wo Contrast Result Date: 09/07/2024 EXAM: CT HEAD WITHOUT 09/07/2024 08:41:20 AM TECHNIQUE: CT of the head was performed without the administration of intravenous contrast. Automated exposure control, iterative reconstruction, and/or weight based adjustment of the mA/kV was utilized to reduce the radiation dose to as low as reasonably achievable. COMPARISON: 06/22/2023 CLINICAL HISTORY: fall FINDINGS: BRAIN AND VENTRICLES: No acute intracranial hemorrhage. No mass effect or midline shift. No extra-axial fluid collection. No evidence of acute infarct. No hydrocephalus. Mild age-related cortical volume loss. ORBITS: No acute abnormality. SINUSES AND MASTOIDS: No acute abnormality. SOFT TISSUES AND SKULL: No acute skull fracture. Mild right frontal scalp contusion. IMPRESSION: 1. No acute intracranial abnormality. 2. Mild right frontal scalp contusion. Electronically signed by: Waddell Calk MD 09/07/2024 08:51 AM EST RP Workstation:  [id number]      Subjective:   Discharge Exam: Vitals:   09/09/24 0430 09/09/24 0804  BP: (!) 95/54 107/65  Pulse: 85 85  Resp: 17 16  Temp: 99 F (37.2 C) 97.8 F (36.6 C)  SpO2: 96% 95%    General: Pt is alert, awake, not in acute distress Cardiovascular: rate controlled, S1/S2 + Respiratory: bilateral decreased breath sounds at bases Abdominal: Soft, NT, ND, bowel sounds + Extremities: no edema, no cyanosis    The results of significant diagnostics from this hospitalization (including imaging, microbiology, ancillary and laboratory) are listed below for reference.     Microbiology: Recent Results (from the past 240 hours)  Blood culture (routine x 2)     Status: None (Preliminary result)   Collection Time: 09/07/24  1:43 PM   Specimen: BLOOD LEFT ARM  Result Value Ref Range Status   Specimen Description BLOOD LEFT ARM  Final   Special Requests   Final    BOTTLES DRAWN AEROBIC AND ANAEROBIC Blood Culture adequate volume   Culture   Final    NO GROWTH 2 DAYS Performed at [facility], [address] [place], [place] [address]    Report Status PENDING  Incomplete  Blood culture (routine x 2)     Status: None (Preliminary result)   Collection Time: 09/07/24  1:43 PM   Specimen: BLOOD RIGHT ARM  Result Value Ref Range Status   Specimen Description BLOOD RIGHT ARM  Final   Special Requests   Final    BOTTLES DRAWN AEROBIC AND ANAEROBIC Blood Culture adequate volume   Culture   Final    NO GROWTH 2 DAYS Performed at [facility], [address] [place], [place] [address]    Report Status PENDING  Incomplete     Labs: BNP (last 3 results) No results for input(s): BNP in the last 8760 hours. Basic Metabolic Panel: Recent Labs  Lab 09/07/24 0835 09/07/24 1343 09/07/24 1457 09/08/24 0342 09/09/24 0417  NA 118*  --  127* 126* 130*  K 5.9*  --  4.4 4.6 4.3  CL 86*  --  95* 98 100  CO2 18*  --  21* 20* 21*  GLUCOSE 582*  --  199*  261* 252*  BUN 47*  --  42* 32* 21  CREATININE 2.18*  --  1.96* 1.55* 1.40*  CALCIUM  9.3  --  8.2* 8.1* 8.1*  MG  --  1.7  --   --   --    Liver Function Tests:  No results for input(s): AST, ALT, ALKPHOS, BILITOT, PROT, ALBUMIN in the last 168 hours. No results for input(s): LIPASE, AMYLASE in the last 168 hours. No results for input(s): AMMONIA in the last 168 hours. CBC: Recent Labs  Lab 09/07/24 0835  WBC 4.7  NEUTROABS 3.2  HGB 14.1  HCT 38.8*  MCV 77.1*  PLT 136*   Cardiac Enzymes: No results for input(s): CKTOTAL, CKMB, CKMBINDEX, TROPONINI in the last 168 hours. BNP: Invalid input(s): POCBNP CBG: Recent Labs  Lab 09/08/24 2042 09/09/24 0021 09/09/24 0317 09/09/24 0802 09/09/24 1159  GLUCAP 442* 218* 210* 169* 144*   D-Dimer No results for input(s): DDIMER in the last 72 hours. Hgb A1c Recent Labs    09/07/24 0835  HGBA1C >15.5*   Lipid Profile No results for input(s): CHOL, HDL, LDLCALC, TRIG, CHOLHDL, LDLDIRECT in the last 72 hours. Thyroid  function studies No results for input(s): TSH, T4TOTAL, T3FREE, THYROIDAB in the last 72 hours.  Invalid input(s): FREET3 Anemia work up No results for input(s): VITAMINB12, FOLATE, FERRITIN, TIBC, IRON, RETICCTPCT in the last 72 hours. Urinalysis    Component Value Date/Time   COLORURINE STRAW (A) 09/07/2024 1305   APPEARANCEUR CLEAR (A) 09/07/2024 1305   APPEARANCEUR Clear 10/22/2014 1107   LABSPEC 1.008 09/07/2024 1305   LABSPEC 1.015 10/22/2014 1107   PHURINE 6.0 09/07/2024 1305   GLUCOSEU >=500 (A) 09/07/2024 1305   GLUCOSEU Negative 10/22/2014 1107   HGBUR NEGATIVE 09/07/2024 1305   BILIRUBINUR NEGATIVE 09/07/2024 1305   BILIRUBINUR Negative 10/22/2014 1107   KETONESUR NEGATIVE 09/07/2024 1305   PROTEINUR NEGATIVE 09/07/2024 1305   NITRITE NEGATIVE 09/07/2024 1305   LEUKOCYTESUR NEGATIVE 09/07/2024 1305   LEUKOCYTESUR Negative  10/22/2014 1107   Sepsis Labs Recent Labs  Lab 09/07/24 0835  WBC 4.7   Microbiology Recent Results (from the past 240 hours)  Blood culture (routine x 2)     Status: None (Preliminary result)   Collection Time: 09/07/24  1:43 PM   Specimen: BLOOD LEFT ARM  Result Value Ref Range Status   Specimen Description BLOOD LEFT ARM  Final   Special Requests   Final    BOTTLES DRAWN AEROBIC AND ANAEROBIC Blood Culture adequate volume   Culture   Final    NO GROWTH 2 DAYS Performed at [facility], [address], [place], [place] [address]    Report Status PENDING  Incomplete  Blood culture (routine x 2)     Status: None (Preliminary result)   Collection Time: 09/07/24  1:43 PM   Specimen: BLOOD RIGHT ARM  Result Value Ref Range Status   Specimen Description BLOOD RIGHT ARM  Final   Special Requests   Final    BOTTLES DRAWN AEROBIC AND ANAEROBIC Blood Culture adequate volume   Culture   Final    NO GROWTH 2 DAYS Performed at [facility], [address] [place], [place] [address]    Report Status PENDING  Incomplete     Time coordinating discharge: 35 minutes  SIGNED:   Derryl Duval, MD  [facility] 09/09/2024, 4:51 PM       [1]  Allergies Allergen Reactions   Sulfa Antibiotics Rash and Hives   Oxycodone  Other (See Comments) and Rash    Rash and headache

## 2024-09-09 NOTE — Inpatient Diabetes Management (Signed)
 Inpatient Diabetes Program Recommendations  AACE/ADA: New Consensus Statement on Inpatient Glycemic Control   Target Ranges:  Prepandial:   less than 140 mg/dL      Peak postprandial:   less than 180 mg/dL (1-2 hours)      Critically ill patients:  140 - 180 mg/dL    Latest Reference Range & Units 09/08/24 08:07 09/08/24 11:53 09/08/24 17:04 09/08/24 20:42 09/09/24 00:21 09/09/24 03:17 09/09/24 08:02  Glucose-Capillary 70 - 99 mg/dL 776 (H) 818 (H) 884 (H) 442 (H) 218 (H) 210 (H) 169 (H)   Review of Glycemic Control  Diabetes history: DM2 Outpatient Diabetes medications: Lantus  33 units daily, Jardiance  10 mg daily Current orders for Inpatient glycemic control: Lantus  33 units daily, Novolog  0-9 units TID with meals, Novolog  0-5 units QHS   Inpatient Diabetes Program Recommendations:     Insulin : Please consider increasing Lantus  to 35 units daily and adding Novolog  3 units TID with meals for meal coverage if patient eats at least 50% of meals.    HbgA1C: A1C in process. to evaluate glycemic control over the past 2-3 months. In Care Everywhere, noted last A1C was 10.7% on 02/11/24 (date he last seen Endocrinology).   Outpatient DM: Patient states that he needs Rx for Lantus  Solostar pens [phone]), Jardiance , and new glucose monitoring kit (#[id number])  at discharge.  He requested Rx for medications for 60-90 day supply.  Thanks, Earnie Gainer, RN, MSN, CDCES Diabetes Coordinator Inpatient Diabetes Program [phone] (Team Pager from 8am to 5pm)

## 2024-09-12 LAB — CULTURE, BLOOD (ROUTINE X 2)
Culture: NO GROWTH
Culture: NO GROWTH
Special Requests: ADEQUATE
Special Requests: ADEQUATE

## 2024-10-01 ENCOUNTER — Ambulatory Visit: Admit: 2024-10-01 | Payer: Medicare (Managed Care)

## 2024-10-01 ENCOUNTER — Ambulatory Visit: Admit: 2024-10-01 | Payer: Medicare (Managed Care) | Attending: Family | Primary: Family

## 2024-10-19 DIAGNOSIS — C23 Malignant neoplasm of gallbladder: Principal | ICD-10-CM

## 2024-10-20 ENCOUNTER — Ambulatory Visit: Admit: 2024-10-20 | Attending: "Endocrinology | Primary: "Endocrinology

## 2024-10-20 DIAGNOSIS — C23 Malignant neoplasm of gallbladder: Principal | ICD-10-CM

## 2024-10-22 DIAGNOSIS — C23 Malignant neoplasm of gallbladder: Principal | ICD-10-CM

## 2024-10-23 DIAGNOSIS — C23 Malignant neoplasm of gallbladder: Principal | ICD-10-CM

## 2024-10-26 DIAGNOSIS — C23 Malignant neoplasm of gallbladder: Principal | ICD-10-CM
